# Patient Record
Sex: Female | Born: 1940 | Race: White | Hispanic: No | Marital: Married | Smoking: Former smoker
Health system: Southern US, Community
[De-identification: ages and names within clinical notes are randomized; demographics above are authoritative.]

## PROBLEM LIST (undated history)

## (undated) DIAGNOSIS — I10 Essential (primary) hypertension: Secondary | ICD-10-CM

## (undated) DIAGNOSIS — G4730 Sleep apnea, unspecified: Secondary | ICD-10-CM

## (undated) DIAGNOSIS — M1712 Unilateral primary osteoarthritis, left knee: Secondary | ICD-10-CM

## (undated) DIAGNOSIS — K219 Gastro-esophageal reflux disease without esophagitis: Secondary | ICD-10-CM

## (undated) DIAGNOSIS — C801 Malignant (primary) neoplasm, unspecified: Secondary | ICD-10-CM

## (undated) DIAGNOSIS — T8859XA Other complications of anesthesia, initial encounter: Secondary | ICD-10-CM

## (undated) DIAGNOSIS — Z8719 Personal history of other diseases of the digestive system: Secondary | ICD-10-CM

## (undated) DIAGNOSIS — I5189 Other ill-defined heart diseases: Secondary | ICD-10-CM

## (undated) DIAGNOSIS — J45909 Unspecified asthma, uncomplicated: Secondary | ICD-10-CM

## (undated) HISTORY — PX: COLONOSCOPY: SHX174

## (undated) HISTORY — DX: Essential (primary) hypertension: I10

## (undated) HISTORY — DX: Other ill-defined heart diseases: I51.89

## (undated) HISTORY — PX: ESOPHAGOGASTRODUODENOSCOPY: SHX1529

## (undated) HISTORY — PX: BREAST BIOPSY: SHX20

## (undated) HISTORY — PX: EYE SURGERY: SHX253

---

## 1997-08-19 ENCOUNTER — Other Ambulatory Visit: Admission: RE | Admit: 1997-08-19 | Discharge: 1997-08-19 | Payer: Self-pay | Admitting: Obstetrics and Gynecology

## 1997-10-10 ENCOUNTER — Ambulatory Visit (HOSPITAL_COMMUNITY): Admission: RE | Admit: 1997-10-10 | Discharge: 1997-10-10 | Payer: Self-pay | Admitting: Obstetrics and Gynecology

## 1998-08-21 ENCOUNTER — Other Ambulatory Visit: Admission: RE | Admit: 1998-08-21 | Discharge: 1998-08-21 | Payer: Self-pay | Admitting: Obstetrics and Gynecology

## 1998-10-23 ENCOUNTER — Encounter: Payer: Self-pay | Admitting: Obstetrics and Gynecology

## 1998-10-23 ENCOUNTER — Ambulatory Visit (HOSPITAL_COMMUNITY): Admission: RE | Admit: 1998-10-23 | Discharge: 1998-10-23 | Payer: Self-pay | Admitting: Obstetrics and Gynecology

## 1999-03-01 ENCOUNTER — Other Ambulatory Visit: Admission: RE | Admit: 1999-03-01 | Discharge: 1999-03-01 | Payer: Self-pay | Admitting: Internal Medicine

## 2000-03-20 ENCOUNTER — Other Ambulatory Visit: Admission: RE | Admit: 2000-03-20 | Discharge: 2000-03-20 | Payer: Self-pay | Admitting: Obstetrics and Gynecology

## 2000-07-02 ENCOUNTER — Encounter: Payer: Self-pay | Admitting: Obstetrics and Gynecology

## 2000-07-02 ENCOUNTER — Ambulatory Visit (HOSPITAL_COMMUNITY): Admission: RE | Admit: 2000-07-02 | Discharge: 2000-07-02 | Payer: Self-pay | Admitting: Obstetrics and Gynecology

## 2001-07-01 ENCOUNTER — Other Ambulatory Visit: Admission: RE | Admit: 2001-07-01 | Discharge: 2001-07-01 | Payer: Self-pay | Admitting: Obstetrics and Gynecology

## 2001-12-30 ENCOUNTER — Encounter: Admission: RE | Admit: 2001-12-30 | Discharge: 2001-12-30 | Payer: Self-pay | Admitting: Family Medicine

## 2001-12-30 ENCOUNTER — Encounter: Payer: Self-pay | Admitting: Family Medicine

## 2002-01-07 ENCOUNTER — Encounter: Admission: RE | Admit: 2002-01-07 | Discharge: 2002-01-07 | Payer: Self-pay | Admitting: Family Medicine

## 2002-01-07 ENCOUNTER — Encounter: Payer: Self-pay | Admitting: Family Medicine

## 2002-08-16 ENCOUNTER — Other Ambulatory Visit: Admission: RE | Admit: 2002-08-16 | Discharge: 2002-08-16 | Payer: Self-pay | Admitting: *Deleted

## 2003-12-14 ENCOUNTER — Other Ambulatory Visit: Admission: RE | Admit: 2003-12-14 | Discharge: 2003-12-14 | Payer: Self-pay | Admitting: Obstetrics and Gynecology

## 2005-02-14 ENCOUNTER — Other Ambulatory Visit: Admission: RE | Admit: 2005-02-14 | Discharge: 2005-02-14 | Payer: Self-pay | Admitting: Obstetrics and Gynecology

## 2009-02-24 HISTORY — PX: CHOLECYSTECTOMY: SHX55

## 2009-03-13 ENCOUNTER — Inpatient Hospital Stay (HOSPITAL_COMMUNITY): Admission: EM | Admit: 2009-03-13 | Discharge: 2009-03-14 | Payer: Self-pay | Admitting: Emergency Medicine

## 2009-03-13 ENCOUNTER — Encounter (INDEPENDENT_AMBULATORY_CARE_PROVIDER_SITE_OTHER): Payer: Self-pay | Admitting: Surgery

## 2010-08-30 LAB — COMPREHENSIVE METABOLIC PANEL
Albumin: 4.1 g/dL (ref 3.5–5.2)
BUN: 15 mg/dL (ref 6–23)
Chloride: 104 mEq/L (ref 96–112)
Creatinine, Ser: 0.8 mg/dL (ref 0.4–1.2)
Total Bilirubin: 0.9 mg/dL (ref 0.3–1.2)

## 2010-08-30 LAB — CBC
HCT: 35.3 % — ABNORMAL LOW (ref 36.0–46.0)
HCT: 42.8 % (ref 36.0–46.0)
Hemoglobin: 14.3 g/dL (ref 12.0–15.0)
MCHC: 33.5 g/dL (ref 30.0–36.0)
MCHC: 34.3 g/dL (ref 30.0–36.0)
MCV: 88 fL (ref 78.0–100.0)
MCV: 88.9 fL (ref 78.0–100.0)
Platelets: 260 10*3/uL (ref 150–400)
Platelets: 333 10*3/uL (ref 150–400)
RBC: 4.82 MIL/uL (ref 3.87–5.11)
RDW: 13.9 % (ref 11.5–15.5)
RDW: 14.4 % (ref 11.5–15.5)
WBC: 7.8 10*3/uL (ref 4.0–10.5)
WBC: 9.5 10*3/uL (ref 4.0–10.5)

## 2010-08-30 LAB — DIFFERENTIAL
Basophils Absolute: 0 10*3/uL (ref 0.0–0.1)
Lymphocytes Relative: 5 % — ABNORMAL LOW (ref 12–46)
Monocytes Absolute: 0.2 10*3/uL (ref 0.1–1.0)
Neutro Abs: 8.8 10*3/uL — ABNORMAL HIGH (ref 1.7–7.7)

## 2010-08-30 LAB — CREATININE, SERUM: GFR calc Af Amer: >60 mL/min (ref >60)

## 2011-09-03 DIAGNOSIS — L719 Rosacea, unspecified: Secondary | ICD-10-CM | POA: Diagnosis not present

## 2011-10-22 DIAGNOSIS — R0609 Other forms of dyspnea: Secondary | ICD-10-CM | POA: Diagnosis not present

## 2011-10-22 DIAGNOSIS — R0989 Other specified symptoms and signs involving the circulatory and respiratory systems: Secondary | ICD-10-CM | POA: Diagnosis not present

## 2011-10-23 DIAGNOSIS — R0602 Shortness of breath: Secondary | ICD-10-CM | POA: Diagnosis not present

## 2011-10-23 DIAGNOSIS — R072 Precordial pain: Secondary | ICD-10-CM | POA: Diagnosis not present

## 2011-10-28 DIAGNOSIS — E7800 Pure hypercholesterolemia, unspecified: Secondary | ICD-10-CM | POA: Diagnosis not present

## 2011-10-28 DIAGNOSIS — R0609 Other forms of dyspnea: Secondary | ICD-10-CM | POA: Diagnosis not present

## 2011-10-28 DIAGNOSIS — R9439 Abnormal result of other cardiovascular function study: Secondary | ICD-10-CM | POA: Diagnosis not present

## 2011-10-28 DIAGNOSIS — R079 Chest pain, unspecified: Secondary | ICD-10-CM | POA: Diagnosis not present

## 2011-11-01 DIAGNOSIS — R079 Chest pain, unspecified: Secondary | ICD-10-CM | POA: Diagnosis not present

## 2011-11-01 DIAGNOSIS — R0602 Shortness of breath: Secondary | ICD-10-CM | POA: Diagnosis not present

## 2011-11-04 DIAGNOSIS — R0602 Shortness of breath: Secondary | ICD-10-CM | POA: Diagnosis not present

## 2011-11-04 DIAGNOSIS — R079 Chest pain, unspecified: Secondary | ICD-10-CM | POA: Diagnosis not present

## 2011-11-11 DIAGNOSIS — R0609 Other forms of dyspnea: Secondary | ICD-10-CM | POA: Diagnosis not present

## 2011-11-11 DIAGNOSIS — E7800 Pure hypercholesterolemia, unspecified: Secondary | ICD-10-CM | POA: Diagnosis not present

## 2011-11-11 DIAGNOSIS — R9439 Abnormal result of other cardiovascular function study: Secondary | ICD-10-CM | POA: Diagnosis not present

## 2011-11-11 DIAGNOSIS — R079 Chest pain, unspecified: Secondary | ICD-10-CM | POA: Diagnosis not present

## 2011-11-18 ENCOUNTER — Institutional Professional Consult (permissible substitution): Payer: Self-pay | Admitting: Cardiovascular Disease

## 2012-04-09 DIAGNOSIS — H2510 Age-related nuclear cataract, unspecified eye: Secondary | ICD-10-CM | POA: Diagnosis not present

## 2012-04-09 DIAGNOSIS — H31009 Unspecified chorioretinal scars, unspecified eye: Secondary | ICD-10-CM | POA: Diagnosis not present

## 2012-04-09 DIAGNOSIS — H04129 Dry eye syndrome of unspecified lacrimal gland: Secondary | ICD-10-CM | POA: Diagnosis not present

## 2012-04-09 DIAGNOSIS — H43819 Vitreous degeneration, unspecified eye: Secondary | ICD-10-CM | POA: Diagnosis not present

## 2012-05-22 DIAGNOSIS — J111 Influenza due to unidentified influenza virus with other respiratory manifestations: Secondary | ICD-10-CM | POA: Diagnosis not present

## 2012-05-22 DIAGNOSIS — R509 Fever, unspecified: Secondary | ICD-10-CM | POA: Diagnosis not present

## 2012-08-18 ENCOUNTER — Other Ambulatory Visit: Payer: Self-pay

## 2012-08-18 DIAGNOSIS — Z1231 Encounter for screening mammogram for malignant neoplasm of breast: Secondary | ICD-10-CM

## 2012-09-17 ENCOUNTER — Ambulatory Visit: Payer: Self-pay

## 2012-09-22 ENCOUNTER — Ambulatory Visit
Admission: RE | Admit: 2012-09-22 | Discharge: 2012-09-22 | Disposition: A | Discharge status: Home / Self Care | Payer: Medicare Other | Source: Ambulatory Visit

## 2012-09-22 DIAGNOSIS — Z1231 Encounter for screening mammogram for malignant neoplasm of breast: Secondary | ICD-10-CM

## 2012-11-10 ENCOUNTER — Encounter: Payer: Self-pay | Admitting: Family Medicine

## 2012-11-10 ENCOUNTER — Ambulatory Visit (INDEPENDENT_AMBULATORY_CARE_PROVIDER_SITE_OTHER): Payer: Medicare Other | Admitting: Family Medicine

## 2012-11-10 VITALS — BP 148/80 | HR 78 | Temp 98.3°F | Resp 16 | Wt 177.0 lb

## 2012-11-10 DIAGNOSIS — F418 Other specified anxiety disorders: Secondary | ICD-10-CM

## 2012-11-10 DIAGNOSIS — I5189 Other ill-defined heart diseases: Secondary | ICD-10-CM | POA: Insufficient documentation

## 2012-11-10 DIAGNOSIS — F411 Generalized anxiety disorder: Secondary | ICD-10-CM

## 2012-11-10 DIAGNOSIS — I10 Essential (primary) hypertension: Secondary | ICD-10-CM | POA: Diagnosis not present

## 2012-11-10 MED ORDER — ALPRAZOLAM 0.5 MG PO TABS: 0.5000 mg | ORAL_TABLET | Freq: Three times a day (TID) | ORAL | Status: DC | PRN

## 2012-11-10 MED ORDER — METOPROLOL SUCCINATE ER 25 MG PO TB24: 25.0000 mg | ORAL_TABLET | Freq: Every day | ORAL | Status: DC

## 2012-11-10 NOTE — Progress Notes (Signed)
  Subjective:    Patient ID: Laura Carter, female    DOB: 01-Sep-1940, 72 y.o.   MRN: [id number]  HPI Patient's blood pressure has been slightly elevated at home. Systolics have been ranging 130 to 140s over diastolics of 80-100.  She has also had increasing anxiety and stress at home. Her husband has medical issues that have not yet been fully evaluated. Her family is flying to [place]. She is dealing with her sister's cancer. All these stressors seem to be having increasing her blood pressure. It has been borderline in the past however now it seems to be true hypertension. She denies any depression. She denies any chest pain or shortness of breath. She has previously tried lisinopril and  hydrochlorothiazide but had stop these medications due to side effects.  She also believes she may have a hernia under her xiphoid process. Past Medical History  Diagnosis Date  . Diastolic dysfunction    No current outpatient prescriptions on file prior to visit.   No current facility-administered medications on file prior to visit.   No Known Allergies History   Social History  . Marital Status: Married    Spouse Name: N/A    Number of Children: N/A  . Years of Education: N/A   Occupational History  . Not on file.   Social History Main Topics  . Smoking status: Former Smoker    Types: Cigarettes  . Smokeless tobacco: Not on file  . Alcohol Use: Not on file  . Drug Use: Not on file  . Sexually Active: Not on file   Other Topics Concern  . Not on file   Social History Narrative  . No narrative on file       Review of Systems  All other systems reviewed and are negative.       Objective:   Physical Exam  Vitals reviewed. Constitutional: She appears well-developed and well-nourished.  Cardiovascular: Normal rate, regular rhythm and normal heart sounds.  Exam reveals no gallop.   No murmur heard. Pulmonary/Chest: Effort normal and breath sounds normal. No respiratory distress.  She has no wheezes. She has no rales.  Abdominal: Soft. Bowel sounds are normal. She exhibits no distension and no mass. There is no tenderness. There is no rebound and no guarding.   I do not palpate a hernia I do feel a diastases recti.       Assessment & Plan:  1. HTN (hypertension) Add Toprol-XL 25 mg by mouth daily. Recheck blood pressure in 2 weeks 2. Situational anxiety I gave the patient prescription for Xanax 0.5 mg tablets one by mouth Q8 hours when necessary anxiety. I gave her 30 tablets. Is to use these sparingly.Marland Kitchen

## 2012-11-30 ENCOUNTER — Encounter: Payer: Self-pay | Admitting: Family Medicine

## 2012-11-30 ENCOUNTER — Ambulatory Visit (INDEPENDENT_AMBULATORY_CARE_PROVIDER_SITE_OTHER): Payer: Medicare Other | Admitting: Family Medicine

## 2012-11-30 VITALS — BP 142/78 | HR 82 | Temp 98.6°F | Resp 18 | Wt 179.0 lb

## 2012-11-30 DIAGNOSIS — I10 Essential (primary) hypertension: Secondary | ICD-10-CM | POA: Diagnosis not present

## 2012-11-30 DIAGNOSIS — J209 Acute bronchitis, unspecified: Secondary | ICD-10-CM | POA: Diagnosis not present

## 2012-11-30 NOTE — Progress Notes (Signed)
Subjective:    Patient ID: Laura Carter, female    DOB: 1940-09-03, 72 y.o.   MRN: [id number]  Hypertension  Cough  11/10/12 Patient's blood pressure has been slightly elevated at home. Systolics have been ranging 130 to 140s over diastolics of 80-100.  She has also had increasing anxiety and stress at home. Her husband has medical issues that have not yet been fully evaluated. Her family is flying to [place]. She is dealing with her sister's cancer. All these stressors seem to be having increasing her blood pressure. It has been borderline in the past however now it seems to be true hypertension. She denies any depression. She denies any chest pain or shortness of breath. She has previously tried lisinopril and  hydrochlorothiazide but had stop these medications due to side effects. At the time, I diagnosed her with: 1. HTN (hypertension) Add Toprol-XL 25 mg by mouth daily. Recheck blood pressure in 2 weeks 2. Situational anxiety I gave the patient prescription for Xanax 0.5 mg tablets one by mouth Q8 hours when necessary anxiety. I gave her 30 tablets. Is to use these sparingly.  11/30/12 Once the patient began to Toprol-XL 25 mg by mouth daily, blood pressure dropped precipitously to 100-116/60 and she felt fatigued.  Having now stop the medication for over a week her blood pressure is again elevated.  She also reports a one-week history of sore throat and nonproductive cough. She reports some chest congestion. She denies any fevers chills or shortness of breath. She does have a remote history of smoking Past Medical History  Diagnosis Date  . Diastolic dysfunction    Current Outpatient Prescriptions on File Prior to Visit  Medication Sig Dispense Refill  . aspirin 81 MG tablet Take 81 mg by mouth daily.      . cholecalciferol (VITAMIN D) 1000 UNITS tablet Take 1,000 Units by mouth daily.      Marland Kitchen co-enzyme Q-10 30 MG capsule Take 30 mg by mouth 3 (three) times daily.      Boris Lown Oil 500 MG  CAPS Take by mouth.      . Magnesium 500 MG CAPS Take by mouth.      . metoprolol succinate (TOPROL-XL) 25 MG 24 hr tablet Take 1 tablet (25 mg total) by mouth daily.  30 tablet  3   No current facility-administered medications on file prior to visit.   No Known Allergies History   Social History  . Marital Status: Married    Spouse Name: N/A    Number of Children: N/A  . Years of Education: N/A   Occupational History  . Not on file.   Social History Main Topics  . Smoking status: Former Smoker    Types: Cigarettes  . Smokeless tobacco: Not on file  . Alcohol Use: Not on file  . Drug Use: Not on file  . Sexually Active: Not on file   Other Topics Concern  . Not on file   Social History Narrative  . No narrative on file       Review of Systems  Respiratory: Positive for cough.   All other systems reviewed and are negative.       Objective:   Physical Exam  Vitals reviewed. Constitutional: She appears well-developed and well-nourished.  Cardiovascular: Normal rate, regular rhythm and normal heart sounds.  Exam reveals no gallop.   No murmur heard. Pulmonary/Chest: Effort normal and breath sounds normal. No respiratory distress. She has no wheezes. She has no rales.  Abdominal: Soft. Bowel sounds are normal. She exhibits no distension and no mass. There is no tenderness. There is no rebound and no guarding.          Assessment & Plan:  1. HTN (hypertension) Reduce Toprol-XL to 12.5 mg by mouth daily and recheck blood pressure in 2 weeks. The patient will call me with her blood pressure readings from home.. 2. Acute bronchitis I feel this is most likely viral in origin. I recommended tincture of time. I also recommended Mucinex DM over-the-counter for cough.  Patient states it seems to be improving over the last 2 days.  If worsening I would be glad to call out a Z-Pak.

## 2013-03-30 DIAGNOSIS — H04229 Epiphora due to insufficient drainage, unspecified lacrimal gland: Secondary | ICD-10-CM | POA: Diagnosis not present

## 2013-03-30 DIAGNOSIS — H02109 Unspecified ectropion of unspecified eye, unspecified eyelid: Secondary | ICD-10-CM | POA: Diagnosis not present

## 2013-04-12 DIAGNOSIS — H43819 Vitreous degeneration, unspecified eye: Secondary | ICD-10-CM | POA: Diagnosis not present

## 2013-04-12 DIAGNOSIS — H04129 Dry eye syndrome of unspecified lacrimal gland: Secondary | ICD-10-CM | POA: Diagnosis not present

## 2013-04-12 DIAGNOSIS — H259 Unspecified age-related cataract: Secondary | ICD-10-CM | POA: Diagnosis not present

## 2013-04-12 DIAGNOSIS — H31009 Unspecified chorioretinal scars, unspecified eye: Secondary | ICD-10-CM | POA: Diagnosis not present

## 2013-05-28 ENCOUNTER — Telehealth: Payer: Self-pay | Admitting: Family Medicine

## 2013-05-28 ENCOUNTER — Encounter: Payer: Self-pay | Admitting: Family Medicine

## 2013-05-28 MED ORDER — METOPROLOL SUCCINATE ER 25 MG PO TB24: 25.0000 mg | ORAL_TABLET | Freq: Every day | ORAL | Status: DC

## 2013-05-28 NOTE — Telephone Encounter (Signed)
Medication refill for one time only.  Patient needs to be seen.  Letter sent for patient to call and schedule 

## 2013-06-03 ENCOUNTER — Ambulatory Visit (INDEPENDENT_AMBULATORY_CARE_PROVIDER_SITE_OTHER): Payer: Medicare Other | Admitting: Family Medicine

## 2013-06-03 ENCOUNTER — Encounter: Payer: Self-pay | Admitting: Family Medicine

## 2013-06-03 VITALS — BP 124/72 | HR 80 | Temp 98.6°F | Resp 18 | Ht 65.0 in | Wt 176.0 lb

## 2013-06-03 DIAGNOSIS — I10 Essential (primary) hypertension: Secondary | ICD-10-CM

## 2013-06-03 DIAGNOSIS — J069 Acute upper respiratory infection, unspecified: Secondary | ICD-10-CM | POA: Diagnosis not present

## 2013-06-03 MED ORDER — METOPROLOL SUCCINATE ER 25 MG PO TB24: 25.0000 mg | ORAL_TABLET | Freq: Every day | ORAL | Status: DC

## 2013-06-03 NOTE — Progress Notes (Signed)
Subjective:    Patient ID: Laura Carter, female    DOB: 1941-05-04, 73 y.o.   MRN: [id number]  HPI Patient reports a one-week history of nasal congestion, rhinorrhea, nonproductive cough, otalgia, and head congestion. She denies any fevers. She denies any myalgias. She denies any nausea vomiting or diarrhea. She is due for recheck of her blood pressure as well. She's tried taking Toprol-XL 25 mg by mouth daily. Her blood pressure is well controlled today. She denies any chest pain or shortness of breath. She is overdue for fasting lab work. Past Medical History  Diagnosis Date  . Diastolic dysfunction    Current Outpatient Prescriptions on File Prior to Visit  Medication Sig Dispense Refill  . aspirin 81 MG tablet Take 81 mg by mouth daily.      . cholecalciferol (VITAMIN D) 1000 UNITS tablet Take 1,000 Units by mouth daily.      Marland Kitchen co-enzyme Q-10 30 MG capsule Take 30 mg by mouth 3 (three) times daily.      Javier Docker Oil 500 MG CAPS Take by mouth.      . Magnesium 500 MG CAPS Take by mouth.       No current facility-administered medications on file prior to visit.   No Known Allergies History   Social History  . Marital Status: Married    Spouse Name: N/A    Number of Children: N/A  . Years of Education: N/A   Occupational History  . Not on file.   Social History Main Topics  . Smoking status: Former Smoker    Types: Cigarettes  . Smokeless tobacco: Not on file  . Alcohol Use: Not on file  . Drug Use: Not on file  . Sexual Activity: Not on file   Other Topics Concern  . Not on file   Social History Narrative  . No narrative on file      Review of Systems  All other systems reviewed and are negative.       Objective:   Physical Exam  Vitals reviewed. Constitutional: She appears well-developed and well-nourished. No distress.  HENT:  Head: Normocephalic and atraumatic.  Right Ear: Tympanic membrane, external ear and ear canal normal.  Left Ear: Tympanic  membrane, external ear and ear canal normal.  Nose: Mucosal edema and rhinorrhea present. Right sinus exhibits no maxillary sinus tenderness and no frontal sinus tenderness. Left sinus exhibits no maxillary sinus tenderness and no frontal sinus tenderness.  Mouth/Throat: Oropharynx is clear and moist and mucous membranes are normal. No oropharyngeal exudate.  Eyes: Conjunctivae are normal. Pupils are equal, round, and reactive to light.  Neck: Neck supple. No JVD present.  Cardiovascular: Normal rate, regular rhythm, normal heart sounds and intact distal pulses.  Exam reveals no gallop and no friction rub.   No murmur heard. Pulmonary/Chest: Effort normal and breath sounds normal. No respiratory distress. She has no wheezes. She has no rales. She exhibits no tenderness.  Abdominal: Soft. Bowel sounds are normal. She exhibits no distension. There is no tenderness. There is no rebound and no guarding.  Lymphadenopathy:    She has no cervical adenopathy.  Skin: She is not diaphoretic.          Assessment & Plan:  1. Acute URI Patient has a viral upper respiratory infection. I recommended Mucinex DM as needed for cough. I recommended Coricidin HBP as needed for congestion. I recommended tincture of time. Recheck in 1 week if no better or sooner if worse  2. HTN (hypertension) Blood pressures well controlled today. Continue current medications at their present doses. Return fasting for a CBC, CMP, and fasting lipid panel. I also recommended that the patient receive Prevnar 13 when she feels better. - COMPLETE METABOLIC PANEL WITH GFR; Future - CBC with Differential; Future - Lipid panel; Future

## 2013-06-04 ENCOUNTER — Ambulatory Visit: Payer: Medicare Other | Admitting: Family Medicine

## 2013-07-05 ENCOUNTER — Other Ambulatory Visit: Payer: Medicare Other

## 2013-07-05 DIAGNOSIS — I10 Essential (primary) hypertension: Secondary | ICD-10-CM | POA: Diagnosis not present

## 2013-07-05 LAB — LIPID PANEL
CHOL/HDL RATIO: 3.8 ratio
Cholesterol: 162 mg/dL (ref 0–200)
HDL: 43 mg/dL (ref >39)
LDL CALC: 84 mg/dL (ref 0–99)
TRIGLYCERIDES: 174 mg/dL — AB (ref <150)
VLDL: 35 mg/dL (ref 0–40)

## 2013-07-05 LAB — CBC WITH DIFFERENTIAL/PLATELET
BASOS PCT: 1 % (ref 0–1)
Basophils Absolute: 0 10*3/uL (ref 0.0–0.1)
EOS ABS: 0.2 10*3/uL (ref 0.0–0.7)
Eosinophils Relative: 4 % (ref 0–5)
HEMATOCRIT: 38.6 % (ref 36.0–46.0)
HEMOGLOBIN: 13 g/dL (ref 12.0–15.0)
LYMPHS ABS: 1.4 10*3/uL (ref 0.7–4.0)
Lymphocytes Relative: 35 % (ref 12–46)
MCH: 28.9 pg (ref 26.0–34.0)
MCHC: 33.7 g/dL (ref 30.0–36.0)
MCV: 85.8 fL (ref 78.0–100.0)
MONO ABS: 0.3 10*3/uL (ref 0.1–1.0)
MONOS PCT: 8 % (ref 3–12)
NEUTROS ABS: 2.1 10*3/uL (ref 1.7–7.7)
Neutrophils Relative %: 52 % (ref 43–77)
Platelets: 297 10*3/uL (ref 150–400)
RBC: 4.5 MIL/uL (ref 3.87–5.11)
RDW: 14.9 % (ref 11.5–15.5)
WBC: 4 10*3/uL (ref 4.0–10.5)

## 2013-07-05 LAB — COMPLETE METABOLIC PANEL WITH GFR
ALK PHOS: 70 U/L (ref 39–117)
ALT: 19 U/L (ref 0–35)
AST: 17 U/L (ref 0–37)
Albumin: 3.6 g/dL (ref 3.5–5.2)
BILIRUBIN TOTAL: 0.7 mg/dL (ref 0.2–1.2)
BUN: 15 mg/dL (ref 6–23)
CO2: 28 mEq/L (ref 19–32)
Calcium: 9.1 mg/dL (ref 8.4–10.5)
Chloride: 107 mEq/L (ref 96–112)
Creat: 0.77 mg/dL (ref 0.50–1.10)
GFR, Est African American: 89 mL/min
GFR, Est Non African American: 77 mL/min
Glucose, Bld: 102 mg/dL — ABNORMAL HIGH (ref 70–99)
Potassium: 4.6 mEq/L (ref 3.5–5.3)
SODIUM: 142 meq/L (ref 135–145)
TOTAL PROTEIN: 6.2 g/dL (ref 6.0–8.3)

## 2013-07-07 ENCOUNTER — Encounter: Payer: Self-pay | Admitting: Family Medicine

## 2013-07-07 ENCOUNTER — Ambulatory Visit (INDEPENDENT_AMBULATORY_CARE_PROVIDER_SITE_OTHER): Payer: Medicare Other | Admitting: Family Medicine

## 2013-07-07 VITALS — BP 120/60 | HR 78 | Temp 97.5°F | Resp 18 | Ht 65.0 in | Wt 180.0 lb

## 2013-07-07 DIAGNOSIS — I10 Essential (primary) hypertension: Secondary | ICD-10-CM | POA: Diagnosis not present

## 2013-07-07 MED ORDER — METOPROLOL SUCCINATE ER 25 MG PO TB24: 25.0000 mg | ORAL_TABLET | Freq: Every day | ORAL | Status: DC

## 2013-07-07 NOTE — Progress Notes (Signed)
Subjective:    Patient ID: Laura Carter, female    DOB: 02/03/1941, 73 y.o.   MRN: [id number]  HPI Patient today for recheck of her blood pressure. She's currently on Toprol-XL 25 mg by mouth daily. She denies any chest pain shortness of breath or dyspnea on exertion. Her home blood pressure has been ranging 120 134/6070. It is well controlled. She denies any side effects or complications from medication. Her most recent lab work is listed below: Appointment on 07/05/2013  Component Date Value Ref Range Status  . Sodium 07/05/2013 142  135 - 145 mEq/L Final  . Potassium 07/05/2013 4.6  3.5 - 5.3 mEq/L Final  . Chloride 07/05/2013 107  96 - 112 mEq/L Final  . CO2 07/05/2013 28  19 - 32 mEq/L Final  . Glucose, Bld 07/05/2013 102* 70 - 99 mg/dL Final  . BUN 07/05/2013 15  6 - 23 mg/dL Final  . Creat 07/05/2013 0.77  0.50 - 1.10 mg/dL Final  . Total Bilirubin 07/05/2013 0.7  0.2 - 1.2 mg/dL Final   ** Please note change in reference range(s). **  . Alkaline Phosphatase 07/05/2013 70  39 - 117 U/L Final  . AST 07/05/2013 17  0 - 37 U/L Final  . ALT 07/05/2013 19  0 - 35 U/L Final  . Total Protein 07/05/2013 6.2  6.0 - 8.3 g/dL Final  . Albumin 07/05/2013 3.6  3.5 - 5.2 g/dL Final  . Calcium 07/05/2013 9.1  8.4 - 10.5 mg/dL Final  . GFR, Est African American 07/05/2013 89   Final  . GFR, Est Non African American 07/05/2013 77   Final   Comment:                            The estimated GFR is a calculation valid for adults (>=39 years old)                          that uses the CKD-EPI algorithm to adjust for age and sex. It is                            not to be used for children, pregnant women, hospitalized patients,                             patients on dialysis, or with rapidly changing kidney function.                          According to the NKDEP, eGFR >89 is normal, 60-89 shows mild                          impairment, 30-59 shows moderate impairment, 15-29 shows severe                     impairment and <15 is ESRD.                             . WBC 07/05/2013 4.0  4.0 - 10.5 K/uL Final  . RBC 07/05/2013 4.50  3.87 - 5.11 MIL/uL Final  . Hemoglobin 07/05/2013 13.0  12.0 - 15.0 g/dL Final  . HCT 07/05/2013 38.6  36.0 - 46.0 %  Final  . MCV 07/05/2013 85.8  78.0 - 100.0 fL Final  . MCH 07/05/2013 28.9  26.0 - 34.0 pg Final  . MCHC 07/05/2013 33.7  30.0 - 36.0 g/dL Final  . RDW 07/05/2013 14.9  11.5 - 15.5 % Final  . Platelets 07/05/2013 297  150 - 400 K/uL Final  . Neutrophils Relative % 07/05/2013 52  43 - 77 % Final  . Neutro Abs 07/05/2013 2.1  1.7 - 7.7 K/uL Final  . Lymphocytes Relative 07/05/2013 35  12 - 46 % Final  . Lymphs Abs 07/05/2013 1.4  0.7 - 4.0 K/uL Final  . Monocytes Relative 07/05/2013 8  3 - 12 % Final  . Monocytes Absolute 07/05/2013 0.3  0.1 - 1.0 K/uL Final  . Eosinophils Relative 07/05/2013 4  0 - 5 % Final  . Eosinophils Absolute 07/05/2013 0.2  0.0 - 0.7 K/uL Final  . Basophils Relative 07/05/2013 1  0 - 1 % Final  . Basophils Absolute 07/05/2013 0.0  0.0 - 0.1 K/uL Final  . Smear Review 07/05/2013 Criteria for review not met   Final  . Cholesterol 07/05/2013 162  0 - 200 mg/dL Final   Comment: ATP III Classification:                                < 200        mg/dL        Desirable                               200 - 239     mg/dL        Borderline High                               >= 240        mg/dL        High                             . Triglycerides 07/05/2013 174* <150 mg/dL Final  . HDL 07/05/2013 43  >39 mg/dL Final  . Total CHOL/HDL Ratio 07/05/2013 3.8   Final  . VLDL 07/05/2013 35  0 - 40 mg/dL Final  . LDL Cholesterol 07/05/2013 84  0 - 99 mg/dL Final   Comment:                            Total Cholesterol/HDL Ratio:CHD Risk                                                 Coronary Heart Disease Risk Table                                                                 Men       Women  1/2 Average Risk              3.4        3.3                                       Average Risk              5.0        4.4                                    2X Average Risk              9.6        7.1                                    3X Average Risk             23.4       11.0                          Use the calculated Patient Ratio above and the CHD Risk table                           to determine the patient's CHD Risk.                          ATP III Classification (LDL):                                < 100        mg/dL         Optimal                               100 - 129     mg/dL         Near or Above Optimal                               130 - 159     mg/dL         Borderline High                               160 - 189     mg/dL         High                                > 190        mg/dL         Very High                              Past Medical History  Diagnosis Date  . Diastolic dysfunction   . Hypertension    Current Outpatient Prescriptions on File Prior  to Visit  Medication Sig Dispense Refill  . aspirin 81 MG tablet Take 81 mg by mouth daily.      . cholecalciferol (VITAMIN D) 1000 UNITS tablet Take 1,000 Units by mouth daily.      Marland Kitchen co-enzyme Q-10 30 MG capsule Take 30 mg by mouth 3 (three) times daily.      Javier Docker Oil 500 MG CAPS Take by mouth.      . Magnesium 500 MG CAPS Take by mouth.       No current facility-administered medications on file prior to visit.   No Known Allergies History   Social History  . Marital Status: Married    Spouse Name: N/A    Number of Children: N/A  . Years of Education: N/A   Occupational History  . Not on file.   Social History Main Topics  . Smoking status: Former Smoker    Types: Cigarettes  . Smokeless tobacco: Not on file  . Alcohol Use: Not on file  . Drug Use: Not on file  . Sexual Activity: Not on file   Other Topics Concern  . Not on file   Social History Narrative  . No narrative  on file      Review of Systems  All other systems reviewed and are negative.       Objective:   Physical Exam  Vitals reviewed. Constitutional: She appears well-developed and well-nourished.  Neck: Neck supple. No JVD present. No thyromegaly present.  Cardiovascular: Normal rate, regular rhythm and normal heart sounds.   No murmur heard. Pulmonary/Chest: Effort normal and breath sounds normal. No respiratory distress. She has no wheezes. She has no rales.  Abdominal: Soft. Bowel sounds are normal. She exhibits no distension. There is no tenderness. There is no rebound and no guarding.  Lymphadenopathy:    She has no cervical adenopathy.          Assessment & Plan:  1. HTN (hypertension) Patient's lab work is excellent. Her blood pressures well controlled. Continue Toprol-XL 25 mg by mouth daily. Recheck in 6 months or as needed.

## 2013-07-28 ENCOUNTER — Telehealth: Payer: Self-pay | Admitting: Family Medicine

## 2013-07-28 NOTE — Telephone Encounter (Signed)
Pt did not want to wait for an appt with Dr. Dennard Schaumann so earliest appointment made with Dr. Buelah Manis

## 2013-07-28 NOTE — Telephone Encounter (Signed)
She needs and appointment to have this done.  [place]

## 2013-07-28 NOTE — Telephone Encounter (Signed)
Message copied by Alyson Locket on Wed Jul 28, 2013 12:36 PM ------      Message from: Lenore Manner      Created: Wed Jul 28, 2013  8:18 AM      Regarding: Spot on nose      Contact: [phone]       PT is wanting to know if she can come in today and have the spot burned off of her nose, she doesn't think she needs an apt but wants to know if Dr Buelah Manis can do it since Dr Dennard Schaumann isn't here.  ------

## 2013-07-30 ENCOUNTER — Encounter: Payer: Self-pay | Admitting: Family Medicine

## 2013-07-30 ENCOUNTER — Ambulatory Visit: Payer: Medicare Other | Admitting: Family Medicine

## 2013-07-30 ENCOUNTER — Ambulatory Visit (INDEPENDENT_AMBULATORY_CARE_PROVIDER_SITE_OTHER): Payer: Medicare Other | Admitting: Family Medicine

## 2013-07-30 VITALS — BP 132/74 | HR 78 | Temp 97.6°F | Resp 18 | Ht 65.0 in | Wt 178.0 lb

## 2013-07-30 DIAGNOSIS — D485 Neoplasm of uncertain behavior of skin: Secondary | ICD-10-CM

## 2013-07-30 NOTE — Progress Notes (Signed)
   Subjective:    Patient ID: Laura Carter, female    DOB: 1941-03-21, 73 y.o.   MRN: [id number]  HPI Patient has a 5 mm scaly red papule on the tip of her nose for several months. 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   She is here today for treatment. Past Medical History  Diagnosis Date  . Diastolic dysfunction   . Hypertension    Current Outpatient Prescriptions on File Prior to Visit  Medication Sig Dispense Refill  . aspirin 81 MG tablet Take 81 mg by mouth daily.      . cholecalciferol (VITAMIN D) 1000 UNITS tablet Take 1,000 Units by mouth daily.      Marland Kitchen co-enzyme Q-10 30 MG capsule Take 30 mg by mouth 3 (three) times daily.      Javier Docker Oil 500 MG CAPS Take by mouth.      . Magnesium 500 MG CAPS Take by mouth.      . metoprolol succinate (TOPROL-XL) 25 MG 24 hr tablet Take 1 tablet (25 mg total) by mouth daily.  90 tablet  3   No current facility-administered medications on file prior to visit.   No Known Allergies History   Social History  . Marital Status: Married    Spouse Name: N/A    Number of Children: N/A  . Years of Education: N/A   Occupational History  . Not on file.   Social History Main Topics  . Smoking status: Former Smoker    Types: Cigarettes  . Smokeless tobacco: Not on file  . Alcohol Use: Not on file  . Drug Use: Not on file  . Sexual Activity: Not on file   Other Topics Concern  . Not on file   Social History Narrative  . No narrative on file      Review of Systems  All other systems reviewed and are negative.       Objective:   Physical Exam  Vitals reviewed. Cardiovascular: Normal rate and regular rhythm.   Pulmonary/Chest: Effort normal and breath sounds normal.  5 millimeters scaly red papule on the tip of the patient's nose        Assessment & Plan:  1. 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 Lesion with cryotherapy using liquid nitrogen for 30 seconds. Wound care was discussed. Recheck in one month. If the lesion persists I would perform a shave biopsy.

## 2013-11-03 ENCOUNTER — Other Ambulatory Visit: Payer: Self-pay

## 2013-11-03 DIAGNOSIS — Z1231 Encounter for screening mammogram for malignant neoplasm of breast: Secondary | ICD-10-CM

## 2013-11-15 ENCOUNTER — Ambulatory Visit
Admission: RE | Admit: 2013-11-15 | Discharge: 2013-11-15 | Disposition: A | Discharge status: Home / Self Care | Payer: Medicare Other | Source: Ambulatory Visit

## 2013-11-15 DIAGNOSIS — Z1231 Encounter for screening mammogram for malignant neoplasm of breast: Secondary | ICD-10-CM | POA: Diagnosis not present

## 2014-04-05 ENCOUNTER — Encounter: Payer: Self-pay | Admitting: Orthopedic Surgery

## 2014-04-05 ENCOUNTER — Ambulatory Visit (INDEPENDENT_AMBULATORY_CARE_PROVIDER_SITE_OTHER): Payer: Medicare Other | Admitting: Orthopedic Surgery

## 2014-04-05 ENCOUNTER — Ambulatory Visit (INDEPENDENT_AMBULATORY_CARE_PROVIDER_SITE_OTHER): Payer: Medicare Other

## 2014-04-05 VITALS — BP 150/75 | Ht 65.0 in | Wt 181.0 lb

## 2014-04-05 DIAGNOSIS — M1712 Unilateral primary osteoarthritis, left knee: Secondary | ICD-10-CM | POA: Diagnosis not present

## 2014-04-05 DIAGNOSIS — M25562 Pain in left knee: Secondary | ICD-10-CM | POA: Diagnosis not present

## 2014-04-05 NOTE — Patient Instructions (Signed)
Tylenol and exercises

## 2014-04-05 NOTE — Progress Notes (Signed)
Patient ID: Laura Carter, female   DOB: 1940-07-29, 73 y.o.   MRN: [id number] Patient ID: Laura Carter, female   DOB: 1941-05-18, 73 y.o.   MRN: [id number]  Chief Complaint  Patient presents with  . Knee Pain    Left knee pain    HPI Laura Carter is a 73 y.o. female.  HPI 73 year old female with mild hypertension presents with 2 out of 10 dull aching pain associated with swelling and catching in her left knee. She hasn't really taken any medication she reports exercise and massage therapy helps her knee pain she has no functional deficits  Review of systems ringing in the ears sinus problem vision disturbance breathing issues seasonal allergies otherwise all systems were negative  Medical history of asthma  Previous cholecystectomy  Medications as listed  No drug allergies; she said that her social history was normal   Past Medical History  Diagnosis Date  . Diastolic dysfunction   . Hypertension     Past Surgical History  Procedure Laterality Date  . Cholecystectomy  10/10    Family History  Problem Relation Age of Onset  . Cancer Mother     breast  . Heart disease Father   . Heart disease Sister     Social History History  Substance Use Topics  . Smoking status: Former Smoker    Types: Cigarettes  . Smokeless tobacco: Not on file  . Alcohol Use: Not on file    No Known Allergies  Current Outpatient Prescriptions  Medication Sig Dispense Refill  . aspirin 81 MG tablet Take 81 mg by mouth daily.    . cholecalciferol (VITAMIN D) 1000 UNITS tablet Take 1,000 Units by mouth daily.    Marland Kitchen co-enzyme Q-10 30 MG capsule Take 30 mg by mouth 3 (three) times daily.    Javier Docker Oil 500 MG CAPS Take by mouth.    . Magnesium 500 MG CAPS Take by mouth.    . metoprolol succinate (TOPROL-XL) 25 MG 24 hr tablet Take 1 tablet (25 mg total) by mouth daily. 90 tablet 3   No current facility-administered medications for this visit.    Review of Systems Review of  Systems  Blood pressure 150/75, height 5\' 5"  (1.651 m), weight 181 lb (82.101 kg).  Physical Exam Physical Exam BP 150/75 mmHg  Ht 5\' 5"  (1.651 m)  Wt 181 lb (82.101 kg)  BMI 30.12 kg/m2 Gen. Appearance is normal, normal grooming normal hygiene no gross deformities. She is oriented 3  Mood and affect are normal  Gait and station are normal no lymph node assistive device  Left knee no effusion but she does have medial joint line tenderness Her range of motion passively is complete 130. All ligaments are tested and stable. Muscle function grade 5 strength no atrophy. Skin normal good distal pulse and normal sensation and reflexes   Data Reviewed Independent x-ray review severe arthritis medial compartment with varus alignment  Assessment    Encounter Diagnosis  Name Primary?  . Primary osteoarthritis of left knee Yes        Plan    Recommend weight loss, exercise, over-the-counter Tylenol and call me and reevaluate when her symptoms worsen        Arther Abbott 04/05/2014, 11:16 AM

## 2014-04-28 ENCOUNTER — Telehealth: Payer: Self-pay | Admitting: Orthopedic Surgery

## 2014-04-28 NOTE — Telephone Encounter (Signed)
Patient called, states has been doing knee exercises and continuing massage therapy and yoga, but would like to try physical therapy.  She knows a therapist, Sherron Flemings, at [facility].  Their therapy department is under group name of "[facility]", [address], [place], [place], ph# [phone], fax# [phone].  Asking if Dr [name] can send orders? Patient ph# is [phone]

## 2014-04-28 NOTE — Telephone Encounter (Signed)
ok 

## 2014-04-28 NOTE — Telephone Encounter (Signed)
Routing to dr harrison 

## 2014-05-02 ENCOUNTER — Other Ambulatory Visit: Payer: Self-pay | Admitting: *Deleted

## 2014-05-02 DIAGNOSIS — M1712 Unilateral primary osteoarthritis, left knee: Secondary | ICD-10-CM

## 2014-05-02 NOTE — Telephone Encounter (Signed)
Order sent as requested, called patient, left vm

## 2014-05-09 DIAGNOSIS — M25562 Pain in left knee: Secondary | ICD-10-CM | POA: Diagnosis not present

## 2014-05-09 DIAGNOSIS — M1712 Unilateral primary osteoarthritis, left knee: Secondary | ICD-10-CM | POA: Diagnosis not present

## 2014-05-13 DIAGNOSIS — M25562 Pain in left knee: Secondary | ICD-10-CM | POA: Diagnosis not present

## 2014-05-13 DIAGNOSIS — M1712 Unilateral primary osteoarthritis, left knee: Secondary | ICD-10-CM | POA: Diagnosis not present

## 2014-05-17 DIAGNOSIS — M25562 Pain in left knee: Secondary | ICD-10-CM | POA: Diagnosis not present

## 2014-05-17 DIAGNOSIS — M1712 Unilateral primary osteoarthritis, left knee: Secondary | ICD-10-CM | POA: Diagnosis not present

## 2014-05-23 ENCOUNTER — Ambulatory Visit (INDEPENDENT_AMBULATORY_CARE_PROVIDER_SITE_OTHER): Payer: Medicare Other | Admitting: Family Medicine

## 2014-05-23 ENCOUNTER — Encounter: Payer: Self-pay | Admitting: Family Medicine

## 2014-05-23 VITALS — BP 120/60 | HR 64 | Temp 98.6°F | Resp 16 | Ht 63.0 in | Wt 179.0 lb

## 2014-05-23 DIAGNOSIS — J209 Acute bronchitis, unspecified: Secondary | ICD-10-CM

## 2014-05-23 MED ORDER — ALBUTEROL SULFATE HFA 108 (90 BASE) MCG/ACT IN AERS: 2.0000 | INHALATION_SPRAY | RESPIRATORY_TRACT | Status: DC | PRN

## 2014-05-23 MED ORDER — AZITHROMYCIN 250 MG PO TABS: ORAL_TABLET | ORAL | Status: DC

## 2014-05-23 NOTE — Progress Notes (Signed)
Patient ID: Laura Carter, female   DOB: [birth year], 73 y.o.   MRN: [id number]   Subjective:    Patient ID: Laura Carter, female    DOB: 1941-04-10, 73 y.o.   MRN: [id number]  Patient presents for Illness patient here with worsening cough with production of yellow sputum and wheezing for little over a week. She has no history of any lung disease. She is a nonsmoker. Her medications were reviewed. She has been using Mucinex as well as Robitussin-DM as well as some herbals , zinc, lemon honey and a diffuser. She denies any shortness of breath or chest pain. Denies any fever. She declines flu shot but is thinking about getting her pneumonia vaccine. + sick contacts    Review Of Systems:  GEN- denies fatigue, fever, weight loss,weakness, recent illness HEENT- denies eye drainage, change in vision, nasal discharge, CVS- denies chest pain, palpitations RESP- denies SOB, +cough, +wheeze ABD- denies N/V, change in stools, abd pain MSK- denies joint pain, muscle aches, injury Neuro- denies headache, dizziness, syncope, seizure activity       Objective:    BP 120/60 mmHg  Pulse 64  Temp(Src) 98.6 F (37 C) (Oral)  Resp 16  Ht 5\' 3"  (1.6 m)  Wt 179 lb (81.194 kg)  BMI 31.72 kg/m2  SpO2 97% GEN- NAD, alert and oriented x3 HEENT- PERRL, EOMI, non injected sclera, pink conjunctiva, MMM, oropharynx mild injection, TM clear bilat no effusion, no maxillary sinus tenderness,+ post nasal drip Nasal drainage  Neck- Supple, no LAD CVS- RRR, no murmur RESP-course BS, wheeze, bilat in lower lobes, no rales, normal WOB, no retractions EXT- No edema Pulses- Radial 2+          Assessment & Plan:      Problem List Items Addressed This Visit    None    Visit Diagnoses    Acute bronchitis, unspecified organism    -  Primary    Treat for bronchitis, albuterol, robitusin DM at night, Mucinex during day, will add zpak based on age and exam to probable cover bacterial component       Note: This  dictation was prepared with Dragon dictation along with smaller phrase technology. Any transcriptional errors that result from this process are unintentional.

## 2014-05-23 NOTE — Patient Instructions (Signed)
Run humidifer RObitussin at bedtime Okay to use netty pot Albuterol as needed for wheeze  Azithromycin antibiotic  F/U as needed

## 2014-05-26 ENCOUNTER — Telehealth: Payer: Self-pay | Admitting: *Deleted

## 2014-05-26 DIAGNOSIS — M25562 Pain in left knee: Secondary | ICD-10-CM | POA: Diagnosis not present

## 2014-05-26 DIAGNOSIS — M1712 Unilateral primary osteoarthritis, left knee: Secondary | ICD-10-CM | POA: Diagnosis not present

## 2014-05-26 MED ORDER — LEVOFLOXACIN 500 MG PO TABS: 500.0000 mg | ORAL_TABLET | Freq: Every day | ORAL | Status: DC

## 2014-05-26 NOTE — Telephone Encounter (Signed)
levaquin 500 qday for 7 days

## 2014-05-26 NOTE — Telephone Encounter (Signed)
Prescription sent to pharmacy.   Call placed to patient. LMTRC.  

## 2014-05-26 NOTE — Telephone Encounter (Signed)
Received call from patient.   Reports that she was seen by MD on 05/23/2014 for bronchitis. States that she was prescribed Zpack and cough suppressants (OTC Robitussin during day and OTC Mucinex at night).   States that she has been taking the ABTx and cough meds, but she feels that she is getting worse. Reports that she now has extreme congestion and productive cough with cream colored to yellow mucus. Denies fever.   Requested to have ABTx extended or changed to stronger medication, and requested stronger cough medication.   MD please advise.

## 2014-05-30 NOTE — Telephone Encounter (Signed)
Call placed to patient.   States that she has begun ABTx. States that cough continues and she is now experiencing rib pain from cough.   Advised to completed ABTx and to increase fluids during the day.

## 2014-05-31 DIAGNOSIS — M25562 Pain in left knee: Secondary | ICD-10-CM | POA: Diagnosis not present

## 2014-05-31 DIAGNOSIS — M1712 Unilateral primary osteoarthritis, left knee: Secondary | ICD-10-CM | POA: Diagnosis not present

## 2014-06-02 DIAGNOSIS — M25562 Pain in left knee: Secondary | ICD-10-CM | POA: Diagnosis not present

## 2014-06-02 DIAGNOSIS — M1712 Unilateral primary osteoarthritis, left knee: Secondary | ICD-10-CM | POA: Diagnosis not present

## 2014-06-03 DIAGNOSIS — H31001 Unspecified chorioretinal scars, right eye: Secondary | ICD-10-CM | POA: Diagnosis not present

## 2014-06-03 DIAGNOSIS — H2513 Age-related nuclear cataract, bilateral: Secondary | ICD-10-CM | POA: Diagnosis not present

## 2014-06-03 DIAGNOSIS — H43813 Vitreous degeneration, bilateral: Secondary | ICD-10-CM | POA: Diagnosis not present

## 2014-06-03 DIAGNOSIS — H5213 Myopia, bilateral: Secondary | ICD-10-CM | POA: Diagnosis not present

## 2014-06-06 ENCOUNTER — Ambulatory Visit (INDEPENDENT_AMBULATORY_CARE_PROVIDER_SITE_OTHER): Payer: Medicare Other | Admitting: Family Medicine

## 2014-06-06 ENCOUNTER — Encounter: Payer: Self-pay | Admitting: Family Medicine

## 2014-06-06 VITALS — BP 136/72 | HR 68 | Temp 97.7°F | Resp 16 | Ht 64.0 in | Wt 176.0 lb

## 2014-06-06 DIAGNOSIS — J209 Acute bronchitis, unspecified: Secondary | ICD-10-CM | POA: Diagnosis not present

## 2014-06-06 MED ORDER — PREDNISONE 20 MG PO TABS: 40.0000 mg | ORAL_TABLET | Freq: Every day | ORAL | Status: DC

## 2014-06-06 NOTE — Progress Notes (Signed)
Patient ID: Laura Carter, female   DOB: 1941/01/19, 74 y.o.   MRN: [id number]       Subjective:    Patient ID: Laura Carter, female    DOB: May 09, 1941, 74 y.o.   MRN: [id number]  Patient presents for Cough patient here with persistent cough with production. I treated her for bronchitis on 1228 she was given a Z-Pak and she is using Robitussin-DM and Mucinex in the morning. She called and last week with worsening cough with production and change in the sputum therefore she was switched to Levaquin which she completed. She continues to cough and has tightness in her chest as well as some wheezing she never used the albuterol inhaler. She's not had any fever. She has noticed some hoarse voice now    Review Of Systems:  GEN- denies fatigue, fever, weight loss,weakness, recent illness HEENT- denies eye drainage, change in vision, nasal discharge, CVS- denies chest pain, palpitations RESP- denies SOB, +cough, +wheeze ABD- denies N/V, change in stools, abd pain GU- denies dysuria, hematuria, dribbling, incontinence MSK- denies joint pain, muscle aches, injury Neuro- denies headache, dizziness, syncope, seizure activity       Objective:    BP 136/72 mmHg  Pulse 68  Temp(Src) 97.7 F (36.5 C) (Oral)  Resp 16  Ht 5\' 4"  (1.626 m)  Wt 176 lb (79.833 kg)  BMI 30.20 kg/m2  SpO2 94% GEN- NAD, alert and oriented x3 HEENT- PERRL, EOMI, non injected sclera, pink conjunctiva, MMM, oropharynx mild injection, TM clear bilat no effusion, no maxillary sinus tenderness,+ post nasal drip  Neck- Supple, no LAD CVS- RRR, no murmur RESP-course BS, bilat scattered wheeze good air movement, normal WOB, no retractions EXT- No edema Pulses- Radial 2+        Assessment & Plan:      Problem List Items Addressed This Visit    None    Visit Diagnoses    Acute bronchitis, unspecified organism    -  Primary    CXR to be obtained, will give Depo Medrol in office and prednisone burst, advised to use  albuterol, continue robitussin DM, no further antibitics unless +CXR    Relevant Orders       DG Chest 2 View       Note: This dictation was prepared with Dragon dictation along with smaller phrase technology. Any transcriptional errors that result from this process are unintentional.

## 2014-06-06 NOTE — Patient Instructions (Signed)
Chest xray tomorrow Steroid shot given Start the steroid pills tomorrow, use inhaler every 4 hours for cough or wheeze Mucinex during day, robitussin DM at bedtime F/U pending results

## 2014-06-07 ENCOUNTER — Ambulatory Visit
Admission: RE | Admit: 2014-06-07 | Discharge: 2014-06-07 | Disposition: A | Discharge status: Home / Self Care | Payer: Medicare Other | Source: Ambulatory Visit | Attending: Family Medicine | Admitting: Family Medicine

## 2014-06-07 ENCOUNTER — Other Ambulatory Visit: Payer: Self-pay | Admitting: *Deleted

## 2014-06-07 DIAGNOSIS — M25562 Pain in left knee: Secondary | ICD-10-CM | POA: Diagnosis not present

## 2014-06-07 DIAGNOSIS — M1712 Unilateral primary osteoarthritis, left knee: Secondary | ICD-10-CM | POA: Diagnosis not present

## 2014-06-07 DIAGNOSIS — J209 Acute bronchitis, unspecified: Secondary | ICD-10-CM

## 2014-06-07 DIAGNOSIS — J189 Pneumonia, unspecified organism: Secondary | ICD-10-CM | POA: Diagnosis not present

## 2014-06-07 MED ORDER — LEVOFLOXACIN 500 MG PO TABS: 500.0000 mg | ORAL_TABLET | Freq: Every day | ORAL | Status: DC

## 2014-06-08 ENCOUNTER — Other Ambulatory Visit: Payer: Self-pay | Admitting: Family Medicine

## 2014-06-08 ENCOUNTER — Telehealth: Payer: Self-pay | Admitting: *Deleted

## 2014-06-08 NOTE — Telephone Encounter (Signed)
This has been taken care of.

## 2014-06-08 NOTE — Telephone Encounter (Signed)
[place] and spoke to [facility] to inform there was a mistake on the order and to please refill the levaquin 500mg  x 5days also pt is aware.

## 2014-06-08 NOTE — Telephone Encounter (Signed)
Pt called stating that she went to pick up her antibiotic Levaquin from her pharmacy today around lunch time and they stated that Dr. Antony Contras office Denied it stating that pt needs office visit. Pt was here 01/11 with acute bronchitis and under the impression that this medication was called in by Dr. Buelah Manis.. Please clarify! Is patient suppose to be on this medication. I see in her med list she was given zpak and prednisone and given levaquin on 05/26/14 not nothing recently.  [place]

## 2014-06-08 NOTE — Telephone Encounter (Signed)
Antibiotic refill denied.  Pt called left message [facility] for refill.

## 2014-06-09 DIAGNOSIS — M25562 Pain in left knee: Secondary | ICD-10-CM | POA: Diagnosis not present

## 2014-06-09 DIAGNOSIS — M1712 Unilateral primary osteoarthritis, left knee: Secondary | ICD-10-CM | POA: Diagnosis not present

## 2014-06-13 DIAGNOSIS — M1712 Unilateral primary osteoarthritis, left knee: Secondary | ICD-10-CM | POA: Diagnosis not present

## 2014-06-13 DIAGNOSIS — M25562 Pain in left knee: Secondary | ICD-10-CM | POA: Diagnosis not present

## 2014-06-15 DIAGNOSIS — L814 Other melanin hyperpigmentation: Secondary | ICD-10-CM | POA: Diagnosis not present

## 2014-06-15 DIAGNOSIS — L309 Dermatitis, unspecified: Secondary | ICD-10-CM | POA: Diagnosis not present

## 2014-06-15 DIAGNOSIS — L821 Other seborrheic keratosis: Secondary | ICD-10-CM | POA: Diagnosis not present

## 2014-06-15 DIAGNOSIS — D225 Melanocytic nevi of trunk: Secondary | ICD-10-CM | POA: Diagnosis not present

## 2014-06-15 DIAGNOSIS — D1801 Hemangioma of skin and subcutaneous tissue: Secondary | ICD-10-CM | POA: Diagnosis not present

## 2014-06-15 DIAGNOSIS — L570 Actinic keratosis: Secondary | ICD-10-CM | POA: Diagnosis not present

## 2014-06-15 DIAGNOSIS — L853 Xerosis cutis: Secondary | ICD-10-CM | POA: Diagnosis not present

## 2014-06-16 DIAGNOSIS — M1712 Unilateral primary osteoarthritis, left knee: Secondary | ICD-10-CM | POA: Diagnosis not present

## 2014-06-16 DIAGNOSIS — M25562 Pain in left knee: Secondary | ICD-10-CM | POA: Diagnosis not present

## 2014-06-21 DIAGNOSIS — M25562 Pain in left knee: Secondary | ICD-10-CM | POA: Diagnosis not present

## 2014-06-21 DIAGNOSIS — M1712 Unilateral primary osteoarthritis, left knee: Secondary | ICD-10-CM | POA: Diagnosis not present

## 2014-06-23 DIAGNOSIS — M1712 Unilateral primary osteoarthritis, left knee: Secondary | ICD-10-CM | POA: Diagnosis not present

## 2014-06-23 DIAGNOSIS — M25562 Pain in left knee: Secondary | ICD-10-CM | POA: Diagnosis not present

## 2014-06-28 DIAGNOSIS — M25562 Pain in left knee: Secondary | ICD-10-CM | POA: Diagnosis not present

## 2014-06-28 DIAGNOSIS — M1712 Unilateral primary osteoarthritis, left knee: Secondary | ICD-10-CM | POA: Diagnosis not present

## 2014-06-30 ENCOUNTER — Ambulatory Visit (INDEPENDENT_AMBULATORY_CARE_PROVIDER_SITE_OTHER): Payer: Medicare Other | Admitting: Physician Assistant

## 2014-06-30 ENCOUNTER — Encounter: Payer: Self-pay | Admitting: Physician Assistant

## 2014-06-30 VITALS — BP 106/66 | HR 60 | Temp 98.2°F | Resp 18 | Wt 175.0 lb

## 2014-06-30 DIAGNOSIS — M25562 Pain in left knee: Secondary | ICD-10-CM | POA: Diagnosis not present

## 2014-06-30 DIAGNOSIS — J189 Pneumonia, unspecified organism: Secondary | ICD-10-CM

## 2014-06-30 DIAGNOSIS — M1712 Unilateral primary osteoarthritis, left knee: Secondary | ICD-10-CM | POA: Diagnosis not present

## 2014-06-30 MED ORDER — LEVOFLOXACIN 750 MG PO TABS: 750.0000 mg | ORAL_TABLET | Freq: Every day | ORAL | Status: DC

## 2014-06-30 NOTE — Progress Notes (Signed)
Patient ID: Laura Carter MRN: [id number], DOB: 02-23-1941, 74 y.o. Date of Encounter: 06/30/2014, 3:03 PM    Chief Complaint:  Chief Complaint  Patient presents with  . follow up    bronchitis>pneumonia  feels like it may be returuning     HPI: 74 y.o. year old white female here for the above.  I reviewed her last 2 office visit notes with Dr. Buelah Manis.  She saw Dr. Buelah Manis 05/23/14. At that time presented with worsening cough with production of yellow sputum and wheezing for a little over a week. Exam at that time was notable for coarse breath sounds, wheeze, bilateral and lower lobes. She was diagnosed with bronchitis and treated with albuterol and azithromycin Z-Pak.  She subsequently called in and reported worsening cough with production of sputum and was changed to Levaquin.  She had follow-up visit with Dr. Buelah Manis 06/06/14. At that time, she reported that she had completed the Levaquin. At that visit she reported that she was continuing to cough and had tightness in her chest as well as some wheezing but had never used the albuterol inhaler. Reported that she had not had any fever. Exam at that time was notable for coarse breath sounds, bilateral scattered wheeze. Was treated with Depo-Medrol IM in the office followed by oral prednisone. Chest x-ray was ordered and Dr. Buelah Manis planned to wait to review that prior to any further treatment.  Chest x-ray was performed on 06/07/2014 and did show pneumonia. Dr. Buelah Manis then prescribed Levaquin 500 mg for 5 more days.  TODAY: Patient states that she came in for follow-up just to make sure that she was not starting to develop bronchitis again. States that during the day she feels fine. Even during the night she feels fine and has no cough or congestion while sleeping. However, says that first thing in the morning she may sneeze and blow her nose once but then that is all cleared. Says that first thing in the morning she has a cough and  that the phlegm has been light green early in the morning. Says that once she coughs this out and blows her nose once , then things are clear and stay cleared all day. She is not feeling that she has any wheezing or rattling in her chest like she was at prior visits. Still, has had no fever.     Home Meds:   Outpatient Prescriptions Prior to Visit  Medication Sig Dispense Refill  . albuterol (PROVENTIL HFA;VENTOLIN HFA) 108 (90 BASE) MCG/ACT inhaler Inhale 2 puffs into the lungs every 4 (four) hours as needed for wheezing or shortness of breath. 1 Inhaler 1  . aspirin 81 MG tablet Take 81 mg by mouth daily.    . cholecalciferol (VITAMIN D) 1000 UNITS tablet Take 1,000 Units by mouth daily.    Marland Kitchen co-enzyme Q-10 30 MG capsule Take 30 mg by mouth 3 (three) times daily.    Javier Docker Oil 500 MG CAPS Take by mouth.    . Magnesium 500 MG CAPS Take by mouth.    . metoprolol succinate (TOPROL-XL) 25 MG 24 hr tablet Take 1 tablet (25 mg total) by mouth daily. 90 tablet 3  . levofloxacin (LEVAQUIN) 500 MG tablet Take 1 tablet (500 mg total) by mouth daily. (Patient not taking: Reported on 06/30/2014) 5 tablet 0  . predniSONE (DELTASONE) 20 MG tablet Take 2 tablets (40 mg total) by mouth daily with breakfast. (Patient not taking: Reported on 06/30/2014) 10 tablet 0  No facility-administered medications prior to visit.    Allergies: No Known Allergies    Review of Systems: See HPI for pertinent ROS. All other ROS negative.    Physical Exam: Blood pressure 106/66, pulse 60, temperature 98.2 F (36.8 C), temperature source Oral, resp. rate 18, weight 175 lb (79.379 kg)., Body mass index is 30.02 kg/(m^2). General: WNWD WF.  Appears in no acute distress. HEENT: Normocephalic, atraumatic, eyes without discharge, sclera non-icteric, nares are without discharge. Bilateral auditory canals clear, TM's are without perforation, pearly grey and translucent with reflective cone of light bilaterally. Oral cavity  moist, posterior pharynx without exudate, erythema, peritonsillar abscess.  Neck: Supple. No thyromegaly. No lymphadenopathy. Lungs: Clear bilaterally to auscultation without wheezes, rales, or rhonchi. Breathing is unlabored. Lungs are clear with good breath sounds and good air movement throughout with no wheezes or rhonchi. Heart: Regular rhythm. No murmurs, rubs, or gallops. Msk:  Strength and tone normal for age. Extremities/Skin: Warm and dry.  Neuro: Alert and oriented X 3. Moves all extremities spontaneously. Gait is normal. CNII-XII grossly in tact. Psych:  Responds to questions appropriately with a normal affect.     ASSESSMENT AND PLAN:  74 y.o. year old female with  1. Pneumonia, organism unspecified I discussed exam findings with patient and the fact that her lungs do sound clear on exam. However, discussed the fact that any type of thick dark phlegm indicates infection. Therefore, I recommend that she get another chest x-ray and take another round of Levaquin. She then says that she has busy weekend with big plans for Super Bowl.Plans to wait until Monday to get her chest x-ray. She also mentions that she is not planning to start the Levaquin now. I explained that if she wants to monitor things for another day or 2 that is fine but that definitely if the phlegm increases or darkens at all then she definitely needs to start the antibiotic. Discussed that just because her lungs sound clear on exam does not mean that there is not any infection there. He voices understanding and agrees with the above regarding needing antibiotics and following up with chest x-ray. - DG Chest 2 View; Future - levofloxacin (LEVAQUIN) 750 MG tablet; Take 1 tablet (750 mg total) by mouth daily.  Dispense: 10 tablet; Refill: 0   Signed, [address] [place], [place], [facility] 06/30/2014 3:03 PM

## 2014-07-04 ENCOUNTER — Ambulatory Visit
Admission: RE | Admit: 2014-07-04 | Discharge: 2014-07-04 | Disposition: A | Discharge status: Home / Self Care | Payer: Medicare Other | Source: Ambulatory Visit | Attending: Physician Assistant | Admitting: Physician Assistant

## 2014-07-04 DIAGNOSIS — J984 Other disorders of lung: Secondary | ICD-10-CM | POA: Diagnosis not present

## 2014-07-04 DIAGNOSIS — J189 Pneumonia, unspecified organism: Secondary | ICD-10-CM

## 2014-07-05 DIAGNOSIS — M1712 Unilateral primary osteoarthritis, left knee: Secondary | ICD-10-CM | POA: Diagnosis not present

## 2014-07-05 DIAGNOSIS — M25562 Pain in left knee: Secondary | ICD-10-CM | POA: Diagnosis not present

## 2014-07-07 DIAGNOSIS — M25562 Pain in left knee: Secondary | ICD-10-CM | POA: Diagnosis not present

## 2014-07-07 DIAGNOSIS — M1712 Unilateral primary osteoarthritis, left knee: Secondary | ICD-10-CM | POA: Diagnosis not present

## 2014-09-12 ENCOUNTER — Encounter: Payer: Self-pay | Admitting: Family Medicine

## 2014-09-12 ENCOUNTER — Ambulatory Visit (INDEPENDENT_AMBULATORY_CARE_PROVIDER_SITE_OTHER): Payer: Medicare Other | Admitting: Family Medicine

## 2014-09-12 VITALS — BP 108/64 | HR 68 | Temp 97.6°F | Resp 16 | Ht 65.0 in | Wt 173.0 lb

## 2014-09-12 DIAGNOSIS — M25562 Pain in left knee: Secondary | ICD-10-CM

## 2014-09-12 DIAGNOSIS — J302 Other seasonal allergic rhinitis: Secondary | ICD-10-CM | POA: Diagnosis not present

## 2014-09-12 MED ORDER — FLUTICASONE PROPIONATE 50 MCG/ACT NA SUSP: 2.0000 | Freq: Every day | NASAL | Status: DC

## 2014-09-12 NOTE — Progress Notes (Signed)
Subjective:    Patient ID: Laura Carter, female    DOB: [birth year], 74 y.o.   MRN: [id number]  HPI Patient presents with a one-week history of cough, postnasal drip, sneezing, and dry itchy watery eyes. She has a history of seasonal allergies. She is concerned that she may have pneumonia or bronchitis. She denies any fever. She denies any shortness of breath. She denies any wheezing. She does have a cough productive of thick mucus. On examination today her right nasal passages essentially obstructed due to swollen nasal mucosa and rhinorrhea. Her left nasal passages markedly diminished in diameter due to swollen nasal mucosa. Otherwise remainder of her exam is normal. She is also having left anterior knee pain. She is seen Dr. [name] and an x-ray showed mild arthritis. She is contemplating getting an MRI. She states that the pain is intermittent and at the present time not severe enough to warrant an arthroscopic if MRI confirmed cartilage damage. She is wanted a second opinion. Past Medical History  Diagnosis Date  . Diastolic dysfunction   . Hypertension    Past Surgical History  Procedure Laterality Date  . Cholecystectomy  10/10   Current Outpatient Prescriptions on File Prior to Visit  Medication Sig Dispense Refill  . aspirin 81 MG tablet Take 81 mg by mouth daily.    Marland Kitchen b complex vitamins tablet Take 1 tablet by mouth daily.    . cholecalciferol (VITAMIN D) 1000 UNITS tablet Take 1,000 Units by mouth daily.    Marland Kitchen co-enzyme Q-10 30 MG capsule Take 30 mg by mouth 3 (three) times daily.    Javier Docker Oil 500 MG CAPS Take by mouth.    . Magnesium 500 MG CAPS Take by mouth.    . metoprolol succinate (TOPROL-XL) 25 MG 24 hr tablet Take 1 tablet (25 mg total) by mouth daily. 90 tablet 3  . albuterol (PROVENTIL HFA;VENTOLIN HFA) 108 (90 BASE) MCG/ACT inhaler Inhale 2 puffs into the lungs every 4 (four) hours as needed for wheezing or shortness of breath. (Patient not taking: Reported on  09/12/2014) 1 Inhaler 1  . Ascorbic Acid (VITAMIN C) 1000 MG tablet Take 2,000 mg by mouth daily.     No current facility-administered medications on file prior to visit.   No Known Allergies History   Social History  . Marital Status: Married    Spouse Name: N/A  . Number of Children: N/A  . Years of Education: N/A   Occupational History  . Not on file.   Social History Main Topics  . Smoking status: Former Smoker    Types: Cigarettes  . Smokeless tobacco: Not on file  . Alcohol Use: Not on file  . Drug Use: Not on file  . Sexual Activity: Not on file   Other Topics Concern  . Not on file   Social History Narrative      Review of Systems  All other systems reviewed and are negative.      Objective:   Physical Exam  Constitutional: She appears well-developed and well-nourished.  HENT:  Right Ear: External ear normal.  Left Ear: External ear normal.  Nose: Mucosal edema and rhinorrhea present. Right sinus exhibits no maxillary sinus tenderness and no frontal sinus tenderness. Left sinus exhibits no maxillary sinus tenderness and no frontal sinus tenderness.  Mouth/Throat: Oropharynx is clear and moist. No oropharyngeal exudate.  Neck: Neck supple.  Cardiovascular: Normal rate, regular rhythm and normal heart sounds.   No murmur heard. Pulmonary/Chest: Effort  normal and breath sounds normal. No respiratory distress. She has no wheezes. She has no rales.  Abdominal: Soft. Bowel sounds are normal. She exhibits no distension. There is no tenderness. There is no rebound and no guarding.  Lymphadenopathy:    She has no cervical adenopathy.  Vitals reviewed.         Assessment & Plan:  Seasonal allergies - Plan: fluticasone (FLONASE) 50 MCG/ACT nasal spray  Left anterior knee pain  I believe the patient's cough is due to seasonal allergies. I recommended Flonase 2 sprays each nostril twice a day. She can also try over-the-counter Zyrtec. I believe her left knee  pain is likely due to a combination of arthritis and possibly patellofemoral pain. At the present time I recommended against an MRI unless the pain becomes significant to the point that it is affecting her quality of life and that she would consider surgery. At the present time she is not ready for an arthroscopic surgery and therefore I said defer obtaining the MRI.

## 2014-09-13 ENCOUNTER — Other Ambulatory Visit: Payer: Self-pay | Admitting: Family Medicine

## 2014-09-13 NOTE — Telephone Encounter (Signed)
Refill appropriate and filled per protocol. 

## 2014-11-02 ENCOUNTER — Other Ambulatory Visit: Payer: Medicare Other

## 2014-11-02 DIAGNOSIS — Z79899 Other long term (current) drug therapy: Secondary | ICD-10-CM

## 2014-11-02 DIAGNOSIS — Z0000 Encounter for general adult medical examination without abnormal findings: Secondary | ICD-10-CM | POA: Diagnosis not present

## 2014-11-02 LAB — COMPLETE METABOLIC PANEL WITH GFR
ALT: 16 U/L (ref 0–35)
AST: 14 U/L (ref 0–37)
Albumin: 3.7 g/dL (ref 3.5–5.2)
Alkaline Phosphatase: 74 U/L (ref 39–117)
BUN: 17 mg/dL (ref 6–23)
CO2: 26 mEq/L (ref 19–32)
Calcium: 9 mg/dL (ref 8.4–10.5)
Chloride: 105 mEq/L (ref 96–112)
Creat: 0.83 mg/dL (ref 0.50–1.10)
GFR, Est African American: 80 mL/min
GFR, Est Non African American: 69 mL/min
Glucose, Bld: 95 mg/dL (ref 70–99)
Potassium: 4.7 mEq/L (ref 3.5–5.3)
Sodium: 139 mEq/L (ref 135–145)
Total Bilirubin: 0.7 mg/dL (ref 0.2–1.2)
Total Protein: 6.1 g/dL (ref 6.0–8.3)

## 2014-11-02 LAB — CBC WITH DIFFERENTIAL/PLATELET
Basophils Absolute: 0 10*3/uL (ref 0.0–0.1)
Basophils Relative: 1 % (ref 0–1)
Eosinophils Absolute: 0.2 10*3/uL (ref 0.0–0.7)
Eosinophils Relative: 5 % (ref 0–5)
HCT: 41.6 % (ref 36.0–46.0)
Hemoglobin: 13.7 g/dL (ref 12.0–15.0)
Lymphocytes Relative: 32 % (ref 12–46)
Lymphs Abs: 1.3 10*3/uL (ref 0.7–4.0)
MCH: 29.9 pg (ref 26.0–34.0)
MCHC: 32.9 g/dL (ref 30.0–36.0)
MCV: 90.8 fL (ref 78.0–100.0)
MPV: 8.8 fL (ref 8.6–12.4)
Monocytes Absolute: 0.4 10*3/uL (ref 0.1–1.0)
Monocytes Relative: 10 % (ref 3–12)
Neutro Abs: 2.1 10*3/uL (ref 1.7–7.7)
Neutrophils Relative %: 52 % (ref 43–77)
Platelets: 280 10*3/uL (ref 150–400)
RBC: 4.58 MIL/uL (ref 3.87–5.11)
RDW: 14.2 % (ref 11.5–15.5)
WBC: 4 10*3/uL (ref 4.0–10.5)

## 2014-11-02 LAB — LIPID PANEL
CHOL/HDL RATIO: 4.4 ratio
CHOLESTEROL: 172 mg/dL (ref 0–200)
HDL: 39 mg/dL — ABNORMAL LOW (ref >=46)
LDL CALC: 89 mg/dL (ref 0–99)
TRIGLYCERIDES: 222 mg/dL — AB (ref <150)
VLDL: 44 mg/dL — AB (ref 0–40)

## 2014-11-03 ENCOUNTER — Encounter: Payer: Self-pay | Admitting: *Deleted

## 2014-11-04 ENCOUNTER — Encounter: Payer: Self-pay | Admitting: *Deleted

## 2014-11-04 NOTE — Telephone Encounter (Signed)
This encounter was created in error - please disregard.

## 2014-12-06 ENCOUNTER — Ambulatory Visit (INDEPENDENT_AMBULATORY_CARE_PROVIDER_SITE_OTHER): Payer: Medicare Other | Admitting: Orthopedic Surgery

## 2014-12-06 ENCOUNTER — Encounter: Payer: Self-pay | Admitting: Orthopedic Surgery

## 2014-12-06 VITALS — BP 156/81 | Ht 65.0 in | Wt 173.0 lb

## 2014-12-06 DIAGNOSIS — M23322 Other meniscus derangements, posterior horn of medial meniscus, left knee: Secondary | ICD-10-CM | POA: Diagnosis not present

## 2014-12-06 DIAGNOSIS — M1712 Unilateral primary osteoarthritis, left knee: Secondary | ICD-10-CM | POA: Diagnosis not present

## 2014-12-06 NOTE — Patient Instructions (Signed)
We will obtain pre-certification from the insurer and call you to schedule the study. Dr Harrison will call you with the results    

## 2014-12-06 NOTE — Progress Notes (Signed)
Patient ID: Laura Carter, female   DOB: 09/25/1940, 74 y.o.   MRN: [id number]  Follow up visit  Chief Complaint  Patient presents with  . Follow-up    Recheck on left knee.    BP 156/81 mmHg  Ht 5\' 5"  (1.651 m)  Wt 173 lb (78.472 kg)  BMI 28.79 kg/m2  Encounter Diagnoses  Name Primary?  . Primary osteoarthritis of left knee   . Medial meniscus, posterior horn derangement, left Yes    The patient comes in for follow-up 74 year old female with a catching sensation left knee and she feels a band rubbing the medial femoral condyle she has some posterior lateral knee pain which may be radicular in nature. Her previous treatments include 8 weeks of physical therapy and Tylenol. She did not want to take any anti-inflammatory is because of gastrointestinal complications associated with it  Currently she has no findings on review of systems related to this illness  Her repeat examination shows stable vital signs normal gait tenderness in the medial joint line palpable medial plica normal range of motion except for she cannot extend compared to her right knee which has full extension. Left knee is otherwise stable she has some pain over the medial compartment with McMurray's maneuver her quadriceps function is intact and normal in the skin in the left knee area is normal as well  We will do an MRI of her knee and then if it's arthritis will inject if it's cartilage tear we will recommend arthroscopic surgery

## 2014-12-20 ENCOUNTER — Ambulatory Visit (HOSPITAL_COMMUNITY)
Admission: RE | Admit: 2014-12-20 | Discharge: 2014-12-20 | Disposition: A | Discharge status: Home / Self Care | Payer: Medicare Other | Source: Ambulatory Visit | Attending: Orthopedic Surgery | Admitting: Orthopedic Surgery

## 2014-12-20 DIAGNOSIS — M23322 Other meniscus derangements, posterior horn of medial meniscus, left knee: Secondary | ICD-10-CM

## 2014-12-20 DIAGNOSIS — M948X6 Other specified disorders of cartilage, lower leg: Secondary | ICD-10-CM | POA: Insufficient documentation

## 2014-12-20 DIAGNOSIS — M25562 Pain in left knee: Secondary | ICD-10-CM | POA: Diagnosis not present

## 2014-12-20 DIAGNOSIS — M7122 Synovial cyst of popliteal space [Baker], left knee: Secondary | ICD-10-CM | POA: Diagnosis not present

## 2014-12-20 DIAGNOSIS — M25462 Effusion, left knee: Secondary | ICD-10-CM | POA: Diagnosis not present

## 2014-12-20 DIAGNOSIS — M23222 Derangement of posterior horn of medial meniscus due to old tear or injury, left knee: Secondary | ICD-10-CM | POA: Insufficient documentation

## 2014-12-28 ENCOUNTER — Telehealth: Payer: Self-pay | Admitting: Orthopedic Surgery

## 2014-12-28 NOTE — Telephone Encounter (Signed)
Called again.......

## 2015-01-12 ENCOUNTER — Encounter: Payer: Self-pay | Admitting: Orthopedic Surgery

## 2015-01-12 ENCOUNTER — Ambulatory Visit (INDEPENDENT_AMBULATORY_CARE_PROVIDER_SITE_OTHER): Payer: Medicare Other | Admitting: Orthopedic Surgery

## 2015-01-12 VITALS — BP 139/73 | Ht 65.0 in | Wt 176.0 lb

## 2015-01-12 DIAGNOSIS — M1712 Unilateral primary osteoarthritis, left knee: Secondary | ICD-10-CM

## 2015-01-12 DIAGNOSIS — M23322 Other meniscus derangements, posterior horn of medial meniscus, left knee: Secondary | ICD-10-CM | POA: Diagnosis not present

## 2015-01-12 NOTE — Progress Notes (Signed)
Chief Complaint  Patient presents with  . Follow-up    follow up left knee, MRI results    BP 139/73 mmHg  Ht 5\' 5"  (1.651 m)  Wt 176 lb (79.833 kg)  BMI 29.29 kg/m2  Patient came in to discuss her MRI have looked at the MRI gray she has arthritis is a torn meniscus but her symptoms aren't that significant and she says she can live with them the way they are  After approximately 10 minute discussion we decided to proceed with nonoperative therapy and have her call us if her symptoms get worse  MRI report also reviewed as follows  IMPRESSION: 1. Complete radial tear of the root of the posterior horn of the medial meniscus with a incomplete radial tear and longitudinal tear of the midbody. 2. Full-thickness cartilage loss in the medial compartment. 3. Large joint effusion with small Baker's cyst.     Electronically Signed   By: Lorriane Shire M.D.   On: 12/20/2014 16:30 Encounter Diagnoses  Name Primary?  . Medial meniscus, posterior horn derangement, left Yes  . Primary osteoarthritis of left knee

## 2015-01-31 ENCOUNTER — Other Ambulatory Visit: Payer: Self-pay | Admitting: Family Medicine

## 2015-02-22 ENCOUNTER — Other Ambulatory Visit: Payer: Self-pay

## 2015-02-22 DIAGNOSIS — Z1231 Encounter for screening mammogram for malignant neoplasm of breast: Secondary | ICD-10-CM

## 2015-03-09 ENCOUNTER — Ambulatory Visit
Admission: RE | Admit: 2015-03-09 | Discharge: 2015-03-09 | Disposition: A | Discharge status: Home / Self Care | Payer: Medicare Other | Source: Ambulatory Visit

## 2015-03-09 DIAGNOSIS — Z1231 Encounter for screening mammogram for malignant neoplasm of breast: Secondary | ICD-10-CM

## 2015-05-09 ENCOUNTER — Ambulatory Visit (INDEPENDENT_AMBULATORY_CARE_PROVIDER_SITE_OTHER): Payer: Medicare Other | Admitting: Family Medicine

## 2015-05-09 ENCOUNTER — Encounter: Payer: Self-pay | Admitting: Family Medicine

## 2015-05-09 VITALS — BP 128/78 | HR 68 | Temp 98.7°F | Resp 18 | Ht 65.0 in | Wt 178.0 lb

## 2015-05-09 DIAGNOSIS — J208 Acute bronchitis due to other specified organisms: Secondary | ICD-10-CM

## 2015-05-09 MED ORDER — AZITHROMYCIN 250 MG PO TABS: ORAL_TABLET | ORAL | Status: DC

## 2015-05-09 NOTE — Progress Notes (Signed)
Subjective:    Patient ID: Laura Carter, female    DOB: 09-16-1940, 74 y.o.   MRN: [id number]  HPI Symptoms began 4 days ago. They started with head congestion, rhinorrhea, and a sore scratchy throat. Yesterday she developed a cough productive of clear and green sputum. She denies any shortness of breath or chest pain or wheezing or fever. She denies any hemoptysis. Past Medical History  Diagnosis Date  . Diastolic dysfunction   . Hypertension    Past Surgical History  Procedure Laterality Date  . Cholecystectomy  10/10   Current Outpatient Prescriptions on File Prior to Visit  Medication Sig Dispense Refill  . albuterol (PROVENTIL HFA;VENTOLIN HFA) 108 (90 BASE) MCG/ACT inhaler Inhale 2 puffs into the lungs every 4 (four) hours as needed for wheezing or shortness of breath. 1 Inhaler 1  . Ascorbic Acid (VITAMIN C) 1000 MG tablet Take 2,000 mg by mouth daily.    Marland Kitchen aspirin 81 MG tablet Take 81 mg by mouth daily.    Marland Kitchen b complex vitamins tablet Take 1 tablet by mouth daily.    . cholecalciferol (VITAMIN D) 1000 UNITS tablet Take 1,000 Units by mouth daily.    Marland Kitchen co-enzyme Q-10 30 MG capsule Take 30 mg by mouth 3 (three) times daily.    . fluticasone (FLONASE) 50 MCG/ACT nasal spray Place 2 sprays into both nostrils daily. 16 g 6  . Krill Oil 500 MG CAPS Take by mouth.    . Magnesium 500 MG CAPS Take by mouth.    . metoprolol succinate (TOPROL-XL) 25 MG 24 hr tablet TAKE ONE TABLET BY MOUTH ONCE DAILY. 90 tablet 3   No current facility-administered medications on file prior to visit.   No Known Allergies Social History   Social History  . Marital Status: Married    Spouse Name: N/A  . Number of Children: N/A  . Years of Education: N/A   Occupational History  . Not on file.   Social History Main Topics  . Smoking status: Former Smoker    Types: Cigarettes  . Smokeless tobacco: Not on file  . Alcohol Use: Not on file  . Drug Use: Not on file  . Sexual Activity: Not on file     Other Topics Concern  . Not on file   Social History Narrative      Review of Systems  All other systems reviewed and are negative.      Objective:   Physical Exam  Constitutional: She appears well-developed and well-nourished.  HENT:  Right Ear: External ear normal.  Left Ear: External ear normal.  Nose: Mucosal edema and rhinorrhea present.  Mouth/Throat: Oropharynx is clear and moist. No oropharyngeal exudate.  Eyes: Conjunctivae are normal.  Cardiovascular: Normal rate, regular rhythm and normal heart sounds.   Pulmonary/Chest: Effort normal and breath sounds normal. No respiratory distress. She has no wheezes. She has no rales.  Lymphadenopathy:    She has no cervical adenopathy.  Vitals reviewed.         Assessment & Plan:  Acute bronchitis due to other specified organisms - Plan: azithromycin (ZITHROMAX) 250 MG tablet  I believe the patient has a viral upper respiratory infection or possibly viral bronchitis. I recommended tincture of time. She can use Sudafed for nasal congestion. She can use Mucinex DM for cough. Last year she had a severe attack that led  Into  Asthmatic bronchitis and this has her concerned. I did give the patient a Z-Pak but I  asked her not to fill the prescription unless she develops a fever, purulent sputum, or worsening cough. At the present time I recommended tincture of time

## 2015-05-31 ENCOUNTER — Ambulatory Visit (INDEPENDENT_AMBULATORY_CARE_PROVIDER_SITE_OTHER): Payer: Medicare Other | Admitting: Family Medicine

## 2015-05-31 ENCOUNTER — Encounter: Payer: Self-pay | Admitting: Family Medicine

## 2015-05-31 VITALS — BP 132/74 | HR 76 | Temp 98.1°F | Resp 16 | Ht 65.0 in | Wt 178.0 lb

## 2015-05-31 DIAGNOSIS — J209 Acute bronchitis, unspecified: Secondary | ICD-10-CM

## 2015-05-31 DIAGNOSIS — R5383 Other fatigue: Secondary | ICD-10-CM | POA: Diagnosis not present

## 2015-05-31 LAB — BASIC METABOLIC PANEL
BUN: 21 mg/dL (ref 7–25)
CHLORIDE: 103 mmol/L (ref 98–110)
CO2: 25 mmol/L (ref 20–31)
CREATININE: 0.87 mg/dL (ref 0.60–0.93)
Calcium: 9.6 mg/dL (ref 8.6–10.4)
GLUCOSE: 97 mg/dL (ref 70–99)
Potassium: 4.6 mmol/L (ref 3.5–5.3)
Sodium: 139 mmol/L (ref 135–146)

## 2015-05-31 LAB — CBC WITH DIFFERENTIAL/PLATELET
BASOS ABS: 0.1 10*3/uL (ref 0.0–0.1)
Basophils Relative: 1 % (ref 0–1)
Eosinophils Absolute: 0.3 10*3/uL (ref 0.0–0.7)
Eosinophils Relative: 4 % (ref 0–5)
HCT: 41.3 % (ref 36.0–46.0)
HEMOGLOBIN: 13.7 g/dL (ref 12.0–15.0)
LYMPHS PCT: 23 % (ref 12–46)
Lymphs Abs: 1.5 10*3/uL (ref 0.7–4.0)
MCH: 29.1 pg (ref 26.0–34.0)
MCHC: 33.2 g/dL (ref 30.0–36.0)
MCV: 87.7 fL (ref 78.0–100.0)
MPV: 8.9 fL (ref 8.6–12.4)
Monocytes Absolute: 0.4 10*3/uL (ref 0.1–1.0)
Monocytes Relative: 7 % (ref 3–12)
NEUTROS ABS: 4.2 10*3/uL (ref 1.7–7.7)
Neutrophils Relative %: 65 % (ref 43–77)
PLATELETS: 349 10*3/uL (ref 150–400)
RBC: 4.71 MIL/uL (ref 3.87–5.11)
RDW: 14.1 % (ref 11.5–15.5)
WBC: 6.4 10*3/uL (ref 4.0–10.5)

## 2015-05-31 LAB — TSH: TSH: 2.315 u[IU]/mL (ref 0.350–4.500)

## 2015-05-31 MED ORDER — PREDNISONE 20 MG PO TABS: 40.0000 mg | ORAL_TABLET | Freq: Every day | ORAL | Status: DC

## 2015-05-31 MED ORDER — ALBUTEROL SULFATE HFA 108 (90 BASE) MCG/ACT IN AERS: 2.0000 | INHALATION_SPRAY | RESPIRATORY_TRACT | Status: DC | PRN

## 2015-05-31 NOTE — Patient Instructions (Signed)
Take the prednisone as prescribed Use albuterol as needed Plenty of water with mucinex or change to Robitussin DM  We will call with lab results F/U as needed

## 2015-05-31 NOTE — Progress Notes (Signed)
Patient ID: [name], female   DOB: June 03, 1940, 75 y.o.   MRN: [id number]   Subjective:    Patient ID: Laura Carter, female    DOB: August 26, 1940, 75 y.o.   MRN: [id number]  Patient presents for F/U and Labs  patient here to follow-up bronchitis. She was seen on December 13 by my partner. A that time she had cough with production conestion but denied an shortness of breath or wheezing.  She was presribed Z-Pak as well as Mucinex DM if she worsened. She took 1 tablet of the zpak and had severe GI upset so she stopped , She was also advised to use Sudafed to decongestant.  SHe does seem to come in once a year with bronchitis. Past few days has had continued cough and wheezing, no fever.  She would also like to have thyroid tested, she noticed thinning hair, constipation, fatigue wosened over past few months. She also does not eat iodized salt .       Review Of Systems:  GEN- + fatigue, denies fever, weight loss,weakness, recent illness HEENT- denies eye drainage, change in vision,+ nasal discharge, CVS- denies chest pain, palpitations RESP- denies SOB,+ cough, wheeze ABD- denies N/V, change in stools, abd pain GU- denies dysuria, hematuria, dribbling, incontinence MSK- denies joint pain, muscle aches, injury Neuro- denies headache, dizziness, syncope, seizure activity       Objective:    BP 132/74 mmHg  Pulse 76  Temp(Src) 98.1 F (36.7 C) (Oral)  Resp 16  Ht 5\' 5"  (1.651 m)  Wt 178 lb (80.74 kg)  BMI 29.62 kg/m2  SpO2 97% GEN- NAD, alert and oriented x3 HEENT- PERRL, EOMI, non injected sclera, pink conjunctiva, MMM, oropharynx clear Neck- Supple,LAD CVS- RRR, no murmur RESP-bilat wheeze, no rhonchi, normal WOB EXT- No edema Pulses- Radial - 2+        Assessment & Plan:      Problem List Items Addressed This Visit    None    Visit Diagnoses    Other fatigue    -  Primary    Normal labs this past summer but no TSH seen, check labs and TSH.     Relevant Orders     TSH    CBC with Differential/Platelet    Basic metabolic panel    Acute bronchitis, unspecified organism        Persistant bronchitis now with RAD, will cover with prednisone, albuterol, advised water with mucinex       Note: This dictation was prepared with Dragon dictation along with smaller phrase technology. Any transcriptional errors that result from this process are unintentional.

## 2015-06-02 ENCOUNTER — Encounter: Payer: Self-pay | Admitting: *Deleted

## 2015-06-05 DIAGNOSIS — H43813 Vitreous degeneration, bilateral: Secondary | ICD-10-CM | POA: Diagnosis not present

## 2015-06-05 DIAGNOSIS — H52203 Unspecified astigmatism, bilateral: Secondary | ICD-10-CM | POA: Diagnosis not present

## 2015-06-05 DIAGNOSIS — H04123 Dry eye syndrome of bilateral lacrimal glands: Secondary | ICD-10-CM | POA: Diagnosis not present

## 2015-06-05 DIAGNOSIS — H25813 Combined forms of age-related cataract, bilateral: Secondary | ICD-10-CM | POA: Diagnosis not present

## 2015-06-13 ENCOUNTER — Ambulatory Visit (INDEPENDENT_AMBULATORY_CARE_PROVIDER_SITE_OTHER): Payer: Medicare Other | Admitting: Orthopedic Surgery

## 2015-06-13 ENCOUNTER — Encounter: Payer: Self-pay | Admitting: Orthopedic Surgery

## 2015-06-13 VITALS — BP 149/83 | Ht 65.0 in | Wt 178.0 lb

## 2015-06-13 DIAGNOSIS — M1712 Unilateral primary osteoarthritis, left knee: Secondary | ICD-10-CM | POA: Diagnosis not present

## 2015-06-13 DIAGNOSIS — M23322 Other meniscus derangements, posterior horn of medial meniscus, left knee: Secondary | ICD-10-CM | POA: Diagnosis not present

## 2015-06-13 NOTE — Progress Notes (Signed)
Chief Complaint  Patient presents with  . Follow-up    follow up left knee, discuss surgery    The patient has increasing knee pain. She's artery had an MRI and we note that she had grade 4 cartilage lesion medial compartment with torn medial meniscus. 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 We discussed her clinical symptoms physical findings MRI findings postoperative course expected results. I cautioned her that she may still have pain because of the arthritis and also because her removing cartilage from the arthritic compartment  Time spent 15 minutes  Patient will have surgery left knee arthroscopy medial meniscectomy.

## 2015-06-13 NOTE — Addendum Note (Signed)
Addended by: Baldomero Lamy B on: 06/13/2015 12:22 PM   Modules accepted: Orders

## 2015-06-13 NOTE — Patient Instructions (Signed)
Surgery 06/16/15 LEFT KNEE ARTHROSCOPY MEDIAL MENISECTOMY

## 2015-06-14 ENCOUNTER — Telehealth: Payer: Self-pay | Admitting: *Deleted

## 2015-06-14 ENCOUNTER — Encounter (HOSPITAL_COMMUNITY): Payer: Self-pay

## 2015-06-14 ENCOUNTER — Encounter (HOSPITAL_COMMUNITY)
Admission: RE | Admit: 2015-06-14 | Discharge: 2015-06-14 | Disposition: A | Discharge status: Home / Self Care | Payer: Medicare Other | Source: Ambulatory Visit | Attending: Orthopedic Surgery | Admitting: Orthopedic Surgery

## 2015-06-14 ENCOUNTER — Telehealth: Payer: Self-pay | Admitting: Orthopedic Surgery

## 2015-06-14 ENCOUNTER — Other Ambulatory Visit: Payer: Self-pay

## 2015-06-14 ENCOUNTER — Other Ambulatory Visit: Payer: Self-pay | Admitting: *Deleted

## 2015-06-14 DIAGNOSIS — M1712 Unilateral primary osteoarthritis, left knee: Secondary | ICD-10-CM | POA: Diagnosis not present

## 2015-06-14 DIAGNOSIS — K219 Gastro-esophageal reflux disease without esophagitis: Secondary | ICD-10-CM | POA: Diagnosis not present

## 2015-06-14 DIAGNOSIS — I110 Hypertensive heart disease with heart failure: Secondary | ICD-10-CM | POA: Diagnosis not present

## 2015-06-14 DIAGNOSIS — Z7951 Long term (current) use of inhaled steroids: Secondary | ICD-10-CM | POA: Diagnosis not present

## 2015-06-14 DIAGNOSIS — M23222 Derangement of posterior horn of medial meniscus due to old tear or injury, left knee: Secondary | ICD-10-CM | POA: Diagnosis not present

## 2015-06-14 DIAGNOSIS — Z01812 Encounter for preprocedural laboratory examination: Secondary | ICD-10-CM | POA: Diagnosis not present

## 2015-06-14 DIAGNOSIS — Z79899 Other long term (current) drug therapy: Secondary | ICD-10-CM | POA: Diagnosis not present

## 2015-06-14 DIAGNOSIS — Z87891 Personal history of nicotine dependence: Secondary | ICD-10-CM | POA: Diagnosis not present

## 2015-06-14 DIAGNOSIS — I5030 Unspecified diastolic (congestive) heart failure: Secondary | ICD-10-CM | POA: Diagnosis not present

## 2015-06-14 DIAGNOSIS — Z7982 Long term (current) use of aspirin: Secondary | ICD-10-CM | POA: Diagnosis not present

## 2015-06-14 HISTORY — DX: Gastro-esophageal reflux disease without esophagitis: K21.9

## 2015-06-14 HISTORY — DX: Unspecified asthma, uncomplicated: J45.909

## 2015-06-14 NOTE — Pre-Procedure Instructions (Signed)
Patient given information to sign up for my chart at home. 

## 2015-06-14 NOTE — Telephone Encounter (Signed)
Patient aware prescription sent as requested

## 2015-06-14 NOTE — Telephone Encounter (Signed)
Regarding out-patient surgery scheduled 06/16/15 at [facility], [place], [phone], contacted insurers - for Medicare, primary, no pre-authorization is required, per guidelines; for secondary insurer, [employer], ph# [phone], no pre-authorization is required as well, per Jenny Reichmann B, 06/14/15, 4:58p.m.

## 2015-06-14 NOTE — Telephone Encounter (Signed)
Patient called stating Pre-Op told her she needs a prescription for a walker, patient said her surgery is Friday morning. Patient uses Personal assistant. Please advise patient when this is done [phone]

## 2015-06-14 NOTE — Patient Instructions (Signed)
TEALA KOLBO  06/14/2015     @PREFPERIOPPHARMACY @   Your procedure is scheduled on  06/16/15  Report to [facility] at   850  A.M.  Call this number if you have problems the morning of surgery:  [phone]   Remember:  Do not eat food or drink liquids after midnight.  Take these medicines the morning of surgery with A SIP OF WATER  Metoprolol, prednisone.   Do not wear jewelry, make-up or nail polish.  Do not wear lotions, powders, or perfumes.  You may wear deodorant.  Do not shave 48 hours prior to surgery.  Men may shave face and neck.  Do not bring valuables to the hospital.  [facility] is not responsible for any belongings or valuables.  Contacts, dentures or bridgework may not be worn into surgery.  Leave your suitcase in the car.  After surgery it may be brought to your room.  For patients admitted to the hospital, discharge time will be determined by your treatment team.  Patients discharged the day of surgery will not be allowed to drive home.   Name and phone number of your driver:   family Special instructions:  none  Please read over the following fact sheets that you were given. Pain Booklet, Coughing and Deep Breathing, Surgical Site Infection Prevention, Anesthesia Post-op Instructions and Care and Recovery After Surgery      Meniscus Injury, Arthroscopy Arthroscopy is a surgical procedure that involves the use of a small scope that has a camera and surgical instruments on the end (arthroscope). An arthroscope can be used to repair your meniscus injury.  LET [facility] CARE PROVIDER KNOW ABOUT:  Any allergies you have.  All medicines you are taking, including vitamins, herbs, eyedrops, creams, and over-the-counter medicines.  Any recent colds or infections you have had or currently have.  Previous problems you or members of your family have had with the use of anesthetics.  Any blood disorders or blood clotting problems you  have.  Previous surgeries you have had.  Medical conditions you have. RISKS AND COMPLICATIONS Generally, this is a safe procedure. However, as with any procedure, problems can occur. Possible problems include:  Damage to nerves or blood vessels.  Excess bleeding.  Blood clots.  Infection. BEFORE THE PROCEDURE  Do not eat or drink for 6-8 hours before the procedure.  Take medicines as directed by your surgeon. Ask your surgeon about changing or stopping your regular medicines.  You may have lab tests the morning of surgery. PROCEDURE  You will be given one of the following:   A medicine that numbs the area (local anesthesia).  A medicine that makes you go to sleep (general anesthesia).  A medicine injected into your spine that numbs your body below the waist (spinal anesthesia). Most often, several small cuts (incisions) are made in the knee. The arthroscope and instruments go into the incisions to repair the damage. The torn portion of the meniscus is removed.  During this time, your surgeon may find a partial or complete tear in a cruciate ligament, such as the anterior cruciate ligament (ACL). A completely torn cruciate ligament is reconstructed by taking tissue from another part of the body (grafting) and placing it into the injured area. This requires several larger incisions to complete the repair. Sometimes, open surgery is needed for collateral ligament injuries. If a collateral ligament is found to be injured, your surgeon may staple or suture  the tear through a slightly larger incision on the side of the knee. AFTER THE PROCEDURE You will be taken to the recovery area where your progress will be monitored. When you are awake, stable, and taking fluids without complications, you will be allowed to go home. This is usually the same day. However, more extensive repairs of a ligament may require an overnight stay.  The recovery time after repairing your meniscus or ligament  depends on the amount of damage to these structures. It also depends on whether or not reconstructive knee surgery was needed.   A torn or stretched ligament (ligament sprain) may take 6-8 weeks to heal. It takes about the same amount of time if your surgeon removed a torn meniscus.  A repaired meniscus may require 6-12 weeks of recovery time.  A torn ligament needing reconstructive surgery may take 6-12 months to heal fully.   This information is not intended to replace advice given to you by your health care provider. Make sure you discuss any questions you have with your health care provider.   Document Released: 05/10/2000 Document Revised: 05/18/2013 Document Reviewed: 10/09/2012 Elsevier Interactive Patient Education 2016 [employer]. Arthroscopy, With Meniscus Injury, Care After Refer to this sheet in the next few weeks. These instructions provide you with general information on caring for yourself after your procedure. Your health care provider may also give you specific instructions. Your treatment has been planned according to the current medical practices, but problems sometimes occur. Call your health care provider if you have any problems or questions after your procedure. WHAT TO EXPECT AFTER THE PROCEDURE After your procedure, it is typical to have the following:  Pain and swelling in your knee.  Constipation.  Difficulty walking. HOME CARE INSTRUCTIONS   Use crutches and do knee exercises as directed by your health care provider.  Apply ice to the injured area:  Put ice in a plastic bag.  Place a towel between your skin and the bag.  Leave the ice on for 15-20 minutes, 3-4 times a day while awake. Do this for the first 2 days.  Rest and raise (elevate) your knee.  Change bandages (dressings) as directed by your health care provider.  Keep the wound dry and clean. The wound may be washed gently with soap and water. Gently blot or dab the wound dry. It is okay to  take showers 24-48 hours after surgery. Do not take baths, use swimming pools, or use hot tubs for 14 days, or as directed by your health care provider.  Only take over-the-counter or prescription medicines for pain, discomfort, or fever as directed by your health care provider.  Continue your normal diet as directed by your health care provider.  Do not lift anything more than 10 pounds or play contact sports for 3 weeks, or as directed by your health care provider.  If a brace was applied, use as directed by your health care provider.  Your health care provider will help with instructions for rehabilitation of your knee. SEEK MEDICAL CARE IF:   You have increased bleeding (more than a small spot) from the wound.  You have redness, swelling, or increasing pain in the wound.  Yellowish-white fluid (pus) is coming from your wound. SEEK IMMEDIATE MEDICAL CARE IF:   You develop a rash.  You have a fever or persistent symptoms for more than 2-3 days.  You have difficulty breathing.  You have increasing pain with movement of the knee. MAKE SURE YOU:  Understand these instructions.  Will watch your condition.  Will get help right away if you are not doing well or get worse.   This information is not intended to replace advice given to you by your health care provider. Make sure you discuss any questions you have with your health care provider.   Document Released: 11/30/2004 Document Revised: 01/13/2013 Document Reviewed: 10/20/2012 Elsevier Interactive Patient Education 2016 [employer] PATIENT INSTRUCTIONS POST-ANESTHESIA  IMMEDIATELY FOLLOWING SURGERY:  Do not drive or operate machinery for the first twenty four hours after surgery.  Do not make any important decisions for twenty four hours after surgery or while taking narcotic pain medications or sedatives.  If you develop intractable nausea and vomiting or a severe headache please notify your doctor  immediately.  FOLLOW-UP:  Please make an appointment with your surgeon as instructed. You do not need to follow up with anesthesia unless specifically instructed to do so.  WOUND CARE INSTRUCTIONS (if applicable):  Keep a dry clean dressing on the anesthesia/puncture wound site if there is drainage.  Once the wound has quit draining you may leave it open to air.  Generally you should leave the bandage intact for twenty four hours unless there is drainage.  If the epidural site drains for more than 36-48 hours please call the anesthesia department.  QUESTIONS?:  Please feel free to call your physician or the hospital operator if you have any questions, and they will be happy to assist you.

## 2015-06-15 NOTE — H&P (Addendum)
Laura Carter is an 75 y.o. female.    Chief Complaint: Left knee pain  HPI: 75 year old female long-standing problems over the last year regarding her left knee. She was treated with the usual nonoperative measures including Tylenol therapy injection. She did not want to take anti-inflammatories because of reflux. Her initial symptoms waxed and waned after treatment but now have become more prominent and involves long mechanical symptoms and pain in the medial joint line and posterior lateral knee which may be radicular in nature but the medial pain and mechanical symptoms were related to her knee, and this was documented with MRI which showed full thickness cartilage lesion medial femoral condyle and a torn medial meniscus  She presents now for arthroscopic partial medial meniscectomy  Past Medical History  Diagnosis Date  . Diastolic dysfunction   . Hypertension   . Asthma   . GERD (gastroesophageal reflux disease)     Past Surgical History  Procedure Laterality Date  . Cholecystectomy  10/10    Family History  Problem Relation Age of Onset  . Cancer Mother     breast  . Heart disease Father   . Heart disease Sister    Social History:  reports that she quit smoking about 50 years ago. Her smoking use included Cigarettes. She has a 10 pack-year smoking history. She does not have any smokeless tobacco history on file. She reports that she does not drink alcohol or use illicit drugs.  Allergies: No Known Allergies  No prescriptions prior to admission    No results found for this or any previous visit (from the past 48 hour(s)). No results found.  Review of Systems  Constitutional: Negative for fever.  HENT: Negative for nosebleeds.   Eyes: Negative for discharge.  Respiratory: Negative for cough.   Cardiovascular: Negative for palpitations.  Gastrointestinal: Negative for abdominal pain.  Genitourinary: Negative for urgency.  Musculoskeletal: Positive for joint pain.   Skin: Negative for rash.  Neurological: Negative for sensory change.  Endo/Heme/Allergies: Does not bruise/bleed easily.  Psychiatric/Behavioral: Negative for hallucinations.    There were no vitals taken for this visit. Physical Exam  Constitutional: She is oriented to person, place, and time. She appears well-developed and well-nourished. No distress.  HENT:  Head: Normocephalic and atraumatic.  Mouth/Throat: No oropharyngeal exudate.  Eyes: Conjunctivae and EOM are normal. Pupils are equal, round, and reactive to light. Right eye exhibits no discharge. Left eye exhibits no discharge. No scleral icterus.  Neck: Normal range of motion. Neck supple. No JVD present. Tracheal deviation present. No thyromegaly present.  Cardiovascular: Normal rate.   Respiratory: Effort normal. No stridor. She has no wheezes.  GI: She exhibits no distension.  Musculoskeletal:  Normal range of motion of the right and left upper extremity, with no swelling or tenderness all joints produce stable without subluxation muscle tone normal skin warm dry and intact neurovascular exam normal    Lymphadenopathy:    She has no cervical adenopathy.  Neurological: She is alert and oriented to person, place, and time. She has normal reflexes. She displays normal reflexes. No cranial nerve deficit. Coordination normal.  Skin: Skin is warm and dry. No rash noted. She is not diaphoretic. No erythema. No pallor.  Psychiatric: She has a normal mood and affect. Her behavior is normal. Judgment normal.    Left knee. The patient is ambulatory without any assistive devices or limp. Inspection reveals tenderness along the medial joint line posterior lateral knee there is no joint effusion. Her  range of motion shows a slight flexion contracture but she can flex the knee past 125 all ligaments were examined and were stable. Her motor exam is normal. Her McMurray sign was positive. Medial joint line tenderness.  Assessment/Plan   MRI IMPRESSION: 1. Complete radial tear of the root of the posterior horn of the medial meniscus with a incomplete radial tear and longitudinal tear of the midbody. 2. Full-thickness cartilage loss in the medial compartment. 3. Large joint effusion with small Baker's cyst.     Electronically Signed   By: Lorriane Shire M.D.   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 She has a full grade for thickness lesion on MRI over the medial femoral condyle.  Salk, med menisectomy  We discussed this in terms of postoperative expectations and possibility of continuing pain from the arthritis  We discussed possible need for knee replacement in the future  Also risks and benefits of surgery were explained alternative treatments which include nonoperative treatment but she declined.  Arther Abbott 06/15/2015, 11:53 AM

## 2015-06-16 ENCOUNTER — Ambulatory Visit (HOSPITAL_COMMUNITY): Payer: Medicare Other | Admitting: Anesthesiology

## 2015-06-16 ENCOUNTER — Ambulatory Visit (HOSPITAL_COMMUNITY)
Admission: RE | Admit: 2015-06-16 | Discharge: 2015-06-16 | Disposition: A | Discharge status: Home / Self Care | Payer: Medicare Other | Source: Ambulatory Visit | Attending: Orthopedic Surgery | Admitting: Orthopedic Surgery

## 2015-06-16 ENCOUNTER — Encounter (HOSPITAL_COMMUNITY): Payer: Self-pay | Admitting: *Deleted

## 2015-06-16 ENCOUNTER — Encounter (HOSPITAL_COMMUNITY)
Admission: RE | Disposition: A | Discharge status: Home / Self Care | Payer: Self-pay | Source: Ambulatory Visit | Attending: Orthopedic Surgery

## 2015-06-16 DIAGNOSIS — I5030 Unspecified diastolic (congestive) heart failure: Secondary | ICD-10-CM | POA: Diagnosis not present

## 2015-06-16 DIAGNOSIS — Z01812 Encounter for preprocedural laboratory examination: Secondary | ICD-10-CM | POA: Insufficient documentation

## 2015-06-16 DIAGNOSIS — M129 Arthropathy, unspecified: Secondary | ICD-10-CM

## 2015-06-16 DIAGNOSIS — Z79899 Other long term (current) drug therapy: Secondary | ICD-10-CM | POA: Insufficient documentation

## 2015-06-16 DIAGNOSIS — I110 Hypertensive heart disease with heart failure: Secondary | ICD-10-CM | POA: Diagnosis not present

## 2015-06-16 DIAGNOSIS — M23322 Other meniscus derangements, posterior horn of medial meniscus, left knee: Secondary | ICD-10-CM

## 2015-06-16 DIAGNOSIS — Z7951 Long term (current) use of inhaled steroids: Secondary | ICD-10-CM | POA: Insufficient documentation

## 2015-06-16 DIAGNOSIS — M23329 Other meniscus derangements, posterior horn of medial meniscus, unspecified knee: Secondary | ICD-10-CM | POA: Insufficient documentation

## 2015-06-16 DIAGNOSIS — M23222 Derangement of posterior horn of medial meniscus due to old tear or injury, left knee: Secondary | ICD-10-CM | POA: Diagnosis not present

## 2015-06-16 DIAGNOSIS — M1710 Unilateral primary osteoarthritis, unspecified knee: Secondary | ICD-10-CM | POA: Insufficient documentation

## 2015-06-16 DIAGNOSIS — Z7982 Long term (current) use of aspirin: Secondary | ICD-10-CM | POA: Insufficient documentation

## 2015-06-16 DIAGNOSIS — K219 Gastro-esophageal reflux disease without esophagitis: Secondary | ICD-10-CM | POA: Diagnosis not present

## 2015-06-16 DIAGNOSIS — M1712 Unilateral primary osteoarthritis, left knee: Secondary | ICD-10-CM | POA: Insufficient documentation

## 2015-06-16 DIAGNOSIS — Z87891 Personal history of nicotine dependence: Secondary | ICD-10-CM | POA: Insufficient documentation

## 2015-06-16 DIAGNOSIS — S83242A Other tear of medial meniscus, current injury, left knee, initial encounter: Secondary | ICD-10-CM | POA: Diagnosis not present

## 2015-06-16 HISTORY — PX: KNEE ARTHROSCOPY WITH MEDIAL MENISECTOMY: SHX5651

## 2015-06-16 SURGERY — ARTHROSCOPY, KNEE, WITH MEDIAL MENISCECTOMY
Anesthesia: General | Site: Knee | Laterality: Left

## 2015-06-16 MED ORDER — DEXAMETHASONE SODIUM PHOSPHATE 4 MG/ML IJ SOLN
INTRAMUSCULAR | Status: AC
  Filled 2015-06-16: qty 1

## 2015-06-16 MED ORDER — PHENYLEPHRINE 40 MCG/ML (10ML) SYRINGE FOR IV PUSH (FOR BLOOD PRESSURE SUPPORT)
PREFILLED_SYRINGE | INTRAVENOUS | Status: AC
  Filled 2015-06-16: qty 10

## 2015-06-16 MED ORDER — EPINEPHRINE HCL 1 MG/ML IJ SOLN
INTRAMUSCULAR | Status: AC
  Filled 2015-06-16: qty 5

## 2015-06-16 MED ORDER — BUPIVACAINE-EPINEPHRINE (PF) 0.5% -1:200000 IJ SOLN
INTRAMUSCULAR | Status: AC
  Filled 2015-06-16: qty 60

## 2015-06-16 MED ORDER — MIDAZOLAM HCL 2 MG/2ML IJ SOLN
1.0000 mg | INTRAMUSCULAR | Status: DC | PRN
  Administered 2015-06-16: 2 mg via INTRAVENOUS

## 2015-06-16 MED ORDER — MIDAZOLAM HCL 2 MG/2ML IJ SOLN
INTRAMUSCULAR | Status: AC
  Filled 2015-06-16: qty 2

## 2015-06-16 MED ORDER — HYDROCODONE-ACETAMINOPHEN 5-325 MG PO TABS: 1.0000 | ORAL_TABLET | ORAL | Status: DC | PRN

## 2015-06-16 MED ORDER — SUCCINYLCHOLINE CHLORIDE 20 MG/ML IJ SOLN
INTRAMUSCULAR | Status: AC
  Filled 2015-06-16: qty 1

## 2015-06-16 MED ORDER — FENTANYL CITRATE (PF) 100 MCG/2ML IJ SOLN
INTRAMUSCULAR | Status: AC
  Filled 2015-06-16: qty 2

## 2015-06-16 MED ORDER — SODIUM CHLORIDE 0.9 % IR SOLN
Status: DC | PRN
  Administered 2015-06-16 (×4): 3000 mL

## 2015-06-16 MED ORDER — SODIUM CHLORIDE 0.9 % IJ SOLN
INTRAMUSCULAR | Status: AC
  Filled 2015-06-16: qty 10

## 2015-06-16 MED ORDER — HYDROCODONE-ACETAMINOPHEN 5-325 MG PO TABS
ORAL_TABLET | ORAL | Status: AC
  Filled 2015-06-16: qty 1

## 2015-06-16 MED ORDER — EPHEDRINE SULFATE 50 MG/ML IJ SOLN
INTRAMUSCULAR | Status: DC | PRN
  Administered 2015-06-16: 10 mg via INTRAVENOUS

## 2015-06-16 MED ORDER — LIDOCAINE HCL (PF) 1 % IJ SOLN
INTRAMUSCULAR | Status: AC
  Filled 2015-06-16: qty 5

## 2015-06-16 MED ORDER — CEFAZOLIN SODIUM-DEXTROSE 2-3 GM-% IV SOLR
2.0000 g | INTRAVENOUS | Status: AC
  Administered 2015-06-16: 2 g via INTRAVENOUS
  Filled 2015-06-16: qty 50

## 2015-06-16 MED ORDER — ONDANSETRON HCL 4 MG/2ML IJ SOLN
4.0000 mg | Freq: Once | INTRAMUSCULAR | Status: AC
  Administered 2015-06-16: 4 mg via INTRAVENOUS

## 2015-06-16 MED ORDER — FENTANYL CITRATE (PF) 250 MCG/5ML IJ SOLN
INTRAMUSCULAR | Status: AC
  Filled 2015-06-16: qty 5

## 2015-06-16 MED ORDER — FENTANYL CITRATE (PF) 250 MCG/5ML IJ SOLN
INTRAMUSCULAR | Status: DC | PRN
  Administered 2015-06-16: 25 ug via INTRAVENOUS
  Administered 2015-06-16: 50 ug via INTRAVENOUS
  Administered 2015-06-16: 25 ug via INTRAVENOUS

## 2015-06-16 MED ORDER — ONDANSETRON HCL 4 MG/2ML IJ SOLN
INTRAMUSCULAR | Status: AC
  Filled 2015-06-16: qty 2

## 2015-06-16 MED ORDER — CHLORHEXIDINE GLUCONATE 4 % EX LIQD: 60.0000 mL | Freq: Once | CUTANEOUS | Status: DC

## 2015-06-16 MED ORDER — ONDANSETRON HCL 4 MG/2ML IJ SOLN: 4.0000 mg | Freq: Once | INTRAMUSCULAR | Status: DC | PRN

## 2015-06-16 MED ORDER — PROPOFOL 10 MG/ML IV BOLUS
INTRAVENOUS | Status: DC | PRN
  Administered 2015-06-16: 120 mg via INTRAVENOUS

## 2015-06-16 MED ORDER — ATROPINE SULFATE 0.4 MG/ML IJ SOLN
INTRAMUSCULAR | Status: AC
  Filled 2015-06-16: qty 1

## 2015-06-16 MED ORDER — FENTANYL CITRATE (PF) 100 MCG/2ML IJ SOLN: 25.0000 ug | INTRAMUSCULAR | Status: DC | PRN

## 2015-06-16 MED ORDER — KETOROLAC TROMETHAMINE 30 MG/ML IJ SOLN
30.0000 mg | Freq: Once | INTRAMUSCULAR | Status: AC
  Administered 2015-06-16: 30 mg via INTRAVENOUS

## 2015-06-16 MED ORDER — LIDOCAINE HCL 1 % IJ SOLN
INTRAMUSCULAR | Status: DC | PRN
  Administered 2015-06-16: 40 mg via INTRADERMAL

## 2015-06-16 MED ORDER — LACTATED RINGERS IV SOLN
INTRAVENOUS | Status: DC
  Administered 2015-06-16 (×2): via INTRAVENOUS

## 2015-06-16 MED ORDER — KETOROLAC TROMETHAMINE 30 MG/ML IJ SOLN
INTRAMUSCULAR | Status: AC
  Filled 2015-06-16: qty 1

## 2015-06-16 MED ORDER — HYDROCODONE-ACETAMINOPHEN 5-325 MG PO TABS
1.0000 | ORAL_TABLET | Freq: Once | ORAL | Status: AC
Start: 2015-06-16 — End: 2015-06-16
  Administered 2015-06-16: 1 via ORAL

## 2015-06-16 MED ORDER — BUPIVACAINE-EPINEPHRINE (PF) 0.5% -1:200000 IJ SOLN
INTRAMUSCULAR | Status: DC | PRN
  Administered 2015-06-16: 30 mL

## 2015-06-16 MED ORDER — PROMETHAZINE HCL 25 MG PO TABS: 25.0000 mg | ORAL_TABLET | Freq: Four times a day (QID) | ORAL | Status: DC | PRN

## 2015-06-16 SURGICAL SUPPLY — 55 items
ARTHROWAND PARAGON T2 (SURGICAL WAND)
BAG HAMPER (MISCELLANEOUS) ×3 IMPLANT
BANDAGE ELASTIC 6 VELCRO NS (GAUZE/BANDAGES/DRESSINGS) ×3 IMPLANT
BLADE AGGRESSIVE PLUS 4.0 (BLADE) ×3 IMPLANT
BLADE SURG SZ11 CARB STEEL (BLADE) ×3 IMPLANT
CHLORAPREP W/TINT 26ML (MISCELLANEOUS) ×4 IMPLANT
CLOTH BEACON ORANGE TIMEOUT ST (SAFETY) ×3 IMPLANT
COOLER CRYO IC GRAV AND TUBE (ORTHOPEDIC SUPPLIES) ×3 IMPLANT
CUFF CRYO KNEE LG 20X31 COOLER (ORTHOPEDIC SUPPLIES) IMPLANT
CUFF CRYO KNEE18X23 MED (MISCELLANEOUS) ×2 IMPLANT
CUFF TOURNIQUET SINGLE 34IN LL (TOURNIQUET CUFF) ×2 IMPLANT
CUFF TOURNIQUET SINGLE 44IN (TOURNIQUET CUFF) IMPLANT
CUTTER ANGLED DBL BITE 4.5 (BURR) IMPLANT
DECANTER SPIKE VIAL GLASS SM (MISCELLANEOUS) ×6 IMPLANT
GAUZE SPONGE 4X4 12PLY STRL (GAUZE/BANDAGES/DRESSINGS) ×3 IMPLANT
GAUZE SPONGE 4X4 16PLY XRAY LF (GAUZE/BANDAGES/DRESSINGS) ×3 IMPLANT
GAUZE XEROFORM 5X9 LF (GAUZE/BANDAGES/DRESSINGS) ×3 IMPLANT
GLOVE BIOGEL PI IND STRL 7.0 (GLOVE) ×1 IMPLANT
GLOVE BIOGEL PI INDICATOR 7.0 (GLOVE) ×2
GLOVE SKINSENSE NS SZ8.0 LF (GLOVE) ×2
GLOVE SKINSENSE STRL SZ8.0 LF (GLOVE) ×1 IMPLANT
GLOVE SS N UNI LF 8.5 STRL (GLOVE) ×3 IMPLANT
GOWN STRL REUS W/ TWL LRG LVL3 (GOWN DISPOSABLE) ×1 IMPLANT
GOWN STRL REUS W/TWL LRG LVL3 (GOWN DISPOSABLE) ×3
GOWN STRL REUS W/TWL XL LVL3 (GOWN DISPOSABLE) ×3 IMPLANT
HLDR LEG FOAM (MISCELLANEOUS) ×1 IMPLANT
IV NS IRRIG 3000ML ARTHROMATIC (IV SOLUTION) ×10 IMPLANT
KIT BLADEGUARD II DBL (SET/KITS/TRAYS/PACK) ×3 IMPLANT
KIT ROOM TURNOVER AP CYSTO (KITS) ×3 IMPLANT
LEG HOLDER FOAM (MISCELLANEOUS) ×2
MANIFOLD NEPTUNE II (INSTRUMENTS) ×3 IMPLANT
MARKER SKIN DUAL TIP RULER LAB (MISCELLANEOUS) ×3 IMPLANT
NDL HYPO 18GX1.5 BLUNT FILL (NEEDLE) ×1 IMPLANT
NDL HYPO 21X1.5 SAFETY (NEEDLE) ×1 IMPLANT
NDL SPNL 18GX3.5 QUINCKE PK (NEEDLE) ×1 IMPLANT
NEEDLE HYPO 18GX1.5 BLUNT FILL (NEEDLE) ×3 IMPLANT
NEEDLE HYPO 21X1.5 SAFETY (NEEDLE) ×3 IMPLANT
NEEDLE SPNL 18GX3.5 QUINCKE PK (NEEDLE) ×3 IMPLANT
NS IRRIG 1000ML POUR BTL (IV SOLUTION) ×3 IMPLANT
PACK ARTHRO LIMB DRAPE STRL (MISCELLANEOUS) ×3 IMPLANT
PAD ABD 5X9 TENDERSORB (GAUZE/BANDAGES/DRESSINGS) ×3 IMPLANT
PAD ARMBOARD 7.5X6 YLW CONV (MISCELLANEOUS) ×3 IMPLANT
PADDING CAST COTTON 6X4 STRL (CAST SUPPLIES) ×3 IMPLANT
PADDING WEBRIL 6 STERILE (GAUZE/BANDAGES/DRESSINGS) ×2 IMPLANT
SET ARTHROSCOPY INST (INSTRUMENTS) ×3 IMPLANT
SET ARTHROSCOPY PUMP TUBE (IRRIGATION / IRRIGATOR) ×3 IMPLANT
SET BASIN LINEN APH (SET/KITS/TRAYS/PACK) ×3 IMPLANT
SPONGE GAUZE 4X4 12PLY (GAUZE/BANDAGES/DRESSINGS) ×1 IMPLANT
SUT ETHILON 3 0 FSL (SUTURE) IMPLANT
SYR 30ML LL (SYRINGE) ×3 IMPLANT
SYRINGE 10CC LL (SYRINGE) ×3 IMPLANT
WAND 50 DEG COVAC W/CORD (SURGICAL WAND) ×2 IMPLANT
WAND 90 DEG TURBOVAC W/CORD (SURGICAL WAND) IMPLANT
WAND ARTHRO PARAGON T2 (SURGICAL WAND) IMPLANT
YANKAUER SUCT BULB TIP 10FT TU (MISCELLANEOUS) ×9 IMPLANT

## 2015-06-16 NOTE — Interval H&P Note (Signed)
History and Physical Interval Note:  06/16/2015 9:40 AM  Laura Carter  has presented today for surgery, with the diagnosis of left knee meniscus tear  The various methods of treatment have been discussed with the patient and family. After consideration of risks, benefits and other options for treatment, the patient has consented to  Procedure(s) with comments: KNEE ARTHROSCOPY WITH MEDIAL MENISECTOMY (Left) - Pt notified of new arrival time 8:00am as a surgical intervention .  The patient's history has been reviewed, patient examined, no change in status, stable for surgery.  I have reviewed the patient's chart and labs.  Questions were answered to the patient's satisfaction.     Arther Abbott

## 2015-06-16 NOTE — Anesthesia Postprocedure Evaluation (Signed)
Anesthesia Post Note  Patient: Mary B Youtz  Procedure(s) Performed: Procedure(s) (LRB): KNEE ARTHROSCOPY WITH PARTIAL MEDIAL MENISECTOMY (Left)  Patient location during evaluation: PACU Anesthesia Type: General Level of consciousness: awake and alert and oriented Pain management: pain level controlled Vital Signs Assessment: post-procedure vital signs reviewed and stable Respiratory status: spontaneous breathing Cardiovascular status: blood pressure returned to baseline Postop Assessment: no signs of nausea or vomiting Anesthetic complications: no    Last Vitals:  Filed Vitals:   06/16/15 1225 06/16/15 1245  BP:  133/64  Pulse: 62 61  Temp:  36.5 C  Resp:  16    Last Pain: There were no vitals filed for this visit.               Montrae Braithwaite

## 2015-06-16 NOTE — Brief Op Note (Signed)
06/16/2015  11:02 AM  PATIENT:  Laura Carter  75 y.o. female  PRE-OPERATIVE DIAGNOSIS:  left knee meniscus tear  POST-OPERATIVE DIAGNOSIS:  left knee meniscus tear, arthritis left knee  PROCEDURE:  Procedure(s): KNEE ARTHROSCOPY WITH MEDIAL MENISECTOMY (Left)  Findings at surgery  Starting in the medial compartment grade 4 femoral and tibial changes and complex tear posterior horn medial meniscus Notch area tibial spine bone spur anterior cruciate ligament PCL intact Lateral compartment grade 2 fissure lateral femoral condyle degenerative fraying lateral meniscus no tear Patellofemoral compartment and trochlea grade 2 changes on each side of the joint and a compensatory spur and hypertrophy of the medial facet  Anesthesia Gen.  Assistants none SURGEON:  Surgeon(s) and Role:    * Carole Civil, MD - Primary EBL:  Total I/O In: 700 [I.V.:700] Out: -   BLOOD ADMINISTERED:none  DRAINS: none   LOCAL MEDICATIONS USED: Marcaine with epinephrine 60 mL  SPECIMEN:  No Specimen  DISPOSITION OF SPECIMEN:  N/A  COUNTS:  YES  TOURNIQUET:    DICTATION: .Dragon Dictation  PLAN OF CARE: Discharge to home after PACU  PATIENT DISPOSITION:  PACU - hemodynamically stable.   Delay start of Pharmacological VTE agent (>24hrs) due to surgical blood loss or risk of bleeding: not applicable

## 2015-06-16 NOTE — Op Note (Signed)
Operative note for 06/16/2015  75 year old female with torn medial meniscus and increasing pain  Preop diagnosis torn medial meniscus left knee Postop diagnosis torn medial meniscus left knee arthritis left knee Procedure arthroscopy left knee partial medial meniscectomy Surgeon [name] No assistants Gen. 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  Timeout completed   Lateral portal established scope placed into the joint diagnostic arthroscopy done by circumferentially going in each compartment of the knee. Findings noted above.  Medial portal established. Medial meniscus probed tear identified as a posterior horn root tear radial and then a vertical tear in the remaining portion of the posterior horn of the meniscus degenerative changes in the meniscus. Chondral changes of the femur noted above  Combination of arthroscopic instruments and ArthroCare wand and shaver were used to remove the meniscal tear and balanced the meniscus until a stable rim which was confirmed by probe  The knee was thoroughly irrigated and any meniscal fragments were removed  The knee is incisions were closed with 3-0 nylon sutures and 60 mL of Marcaine was injected into the joint  Sterile  bandages applied Cryo/Cuff applied and activated  Patient exhibited taken recovery in stable condition  Postop plan weightbearing as tolerated 3-4 weeks of range of motion exercises and strengthening  Patient will need knee replacement in the future.

## 2015-06-16 NOTE — Anesthesia Preprocedure Evaluation (Signed)
Anesthesia Evaluation  Patient identified by MRN, date of birth, ID band Patient awake    Reviewed: Allergy & Precautions, NPO status , Patient's Chart, lab work & pertinent test results, reviewed documented beta blocker date and time   Airway Mallampati: II  TM Distance: <3 FB Neck ROM: Full    Dental  (+) Teeth Intact, Caps   Pulmonary asthma , former smoker,    Pulmonary exam normal        Cardiovascular hypertension, Pt. on medications and Pt. on home beta blockers Normal cardiovascular exam     Neuro/Psych    GI/Hepatic GERD  Medicated and Controlled,  Endo/Other    Renal/GU      Musculoskeletal  (+) Arthritis , Osteoarthritis,    Abdominal Normal abdominal exam  (+)   Peds  Hematology   Anesthesia Other Findings   Reproductive/Obstetrics                             Anesthesia Physical Anesthesia Plan  ASA: III  Anesthesia Plan: General   Post-op Pain Management:    Induction: Intravenous  Airway Management Planned: LMA  Additional Equipment:   Intra-op Plan:   Post-operative Plan: Extubation in OR  Informed Consent: I have reviewed the patients History and Physical, chart, labs and discussed the procedure including the risks, benefits and alternatives for the proposed anesthesia with the patient or authorized representative who has indicated his/her understanding and acceptance.   Dental advisory given  Plan Discussed with: CRNA  Anesthesia Plan Comments:         Anesthesia Quick Evaluation

## 2015-06-16 NOTE — Transfer of Care (Signed)
Immediate Anesthesia Transfer of Care Note  Patient: Laura Carter  Procedure(s) Performed: Procedure(s): KNEE ARTHROSCOPY WITH PARTIAL MEDIAL MENISECTOMY (Left)  Patient Location: PACU  Anesthesia Type:General  Level of Consciousness: awake, alert  and oriented  Airway & Oxygen Therapy: Patient Spontanous Breathing and Patient connected to face mask oxygen  Post-op Assessment: Report given to RN  Post vital signs: Reviewed and stable  Last Vitals:  Filed Vitals:   06/16/15 0945 06/16/15 0950  BP: 133/79 133/73  Temp:    Resp: 15 15    Complications: No apparent anesthesia complications

## 2015-06-16 NOTE — Anesthesia Procedure Notes (Signed)
Procedure Name: LMA Insertion Date/Time: 06/16/2015 10:04 AM Performed by: Tressie Stalker E Pre-anesthesia Checklist: Patient identified, Patient being monitored, Emergency Drugs available, Timeout performed and Suction available Patient Re-evaluated:Patient Re-evaluated prior to inductionOxygen Delivery Method: Circle System Utilized Preoxygenation: Pre-oxygenation with 100% oxygen Intubation Type: IV induction Ventilation: Mask ventilation without difficulty LMA: LMA inserted LMA Size: 4.0 Number of attempts: 1 Placement Confirmation: positive ETCO2 and breath sounds checked- equal and bilateral

## 2015-06-16 NOTE — Discharge Instructions (Signed)
Arthroscopy, With Meniscus Injury, Care After Refer to this sheet in the next few weeks. These instructions provide you with general information on caring for yourself after your procedure. Your health care provider may also give you specific instructions. Your treatment has been planned according to the current medical practices, but problems sometimes occur. Call your health care provider if you have any problems or questions after your procedure. WHAT TO EXPECT AFTER THE PROCEDURE After your procedure, it is typical to have the following:  Pain and swelling in your knee.  Constipation.  Difficulty walking. HOME CARE INSTRUCTIONS   Use crutches and do knee exercises as directed by your health care provider.  Apply ice to the injured area:  Put ice in a plastic bag.  Place a towel between your skin and the bag.  Leave the ice on for 15-20 minutes, 3-4 times a day while awake. Do this for the first 2 days.  Rest and raise (elevate) your knee.  Change bandages (dressings) as directed by your health care provider.  Keep the wound dry and clean. The wound may be washed gently with soap and water. Gently blot or dab the wound dry. It is okay to take showers 24-48 hours after surgery. Do not take baths, use swimming pools, or use hot tubs for 14 days, or as directed by your health care provider.  Only take over-the-counter or prescription medicines for pain, discomfort, or fever as directed by your health care provider.  Continue your normal diet as directed by your health care provider.  Do not lift anything more than 10 pounds or play contact sports for 3 weeks, or as directed by your health care provider.  If a brace was applied, use as directed by your health care provider.  Your health care provider will help with instructions for rehabilitation of your knee. SEEK MEDICAL CARE IF:   You have increased bleeding (more than a small spot) from the wound.  You have redness,  swelling, or increasing pain in the wound.  Yellowish-white fluid (pus) is coming from your wound. SEEK IMMEDIATE MEDICAL CARE IF:   You develop a rash.  You have a fever or persistent symptoms for more than 2-3 days.  You have difficulty breathing.  You have increasing pain with movement of the knee. MAKE SURE YOU:   Understand these instructions.  Will watch your condition.  Will get help right away if you are not doing well or get worse.   This information is not intended to replace advice given to you by your health care provider. Make sure you discuss any questions you have with your health care provider.   Document Released: 11/30/2004 Document Revised: 01/13/2013 Document Reviewed: 10/20/2012 Elsevier Interactive Patient Education [employer].

## 2015-06-19 ENCOUNTER — Ambulatory Visit (INDEPENDENT_AMBULATORY_CARE_PROVIDER_SITE_OTHER): Payer: Self-pay | Admitting: Orthopedic Surgery

## 2015-06-19 ENCOUNTER — Encounter (HOSPITAL_COMMUNITY): Payer: Self-pay | Admitting: Orthopedic Surgery

## 2015-06-19 VITALS — BP 81/54 | Ht 65.0 in | Wt 180.0 lb

## 2015-06-19 DIAGNOSIS — Z9889 Other specified postprocedural states: Secondary | ICD-10-CM

## 2015-06-19 NOTE — Patient Instructions (Signed)
Call [facility] therapy dept to schedule therapy visits 

## 2015-06-19 NOTE — Progress Notes (Signed)
Patient ID: Laura Carter, female   DOB: 03/13/1941, 75 y.o.   MRN: [id number]   POST OP VISIT  S/P KNEE ARTHROSCOPY  Chief Complaint  Patient presents with  . Follow-up    Post op #1, left knee, SALK, DOS 06-16-15.     BP 81/54 mmHg  Ht 5\' 5"  (1.651 m)  Wt 180 lb (81.647 kg)  BMI 29.95 kg/m2  The surgical sites look clean dry and intact.    ASSESSMENT AND PLAN   Start physical therapy and arrange follow-up visit   75 year old female with torn medial meniscus and increasing pain  Preop diagnosis torn medial meniscus left knee Postop diagnosis torn medial meniscus left knee arthritis left knee Procedure arthroscopy left knee partial medial meniscectomy Surgeon [name] No assistants Gen. 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

## 2015-06-21 ENCOUNTER — Ambulatory Visit (HOSPITAL_COMMUNITY): Payer: Medicare Other | Attending: Orthopedic Surgery | Admitting: Physical Therapy

## 2015-06-21 DIAGNOSIS — M25562 Pain in left knee: Secondary | ICD-10-CM | POA: Diagnosis not present

## 2015-06-21 DIAGNOSIS — M25662 Stiffness of left knee, not elsewhere classified: Secondary | ICD-10-CM | POA: Diagnosis not present

## 2015-06-21 DIAGNOSIS — R262 Difficulty in walking, not elsewhere classified: Secondary | ICD-10-CM | POA: Insufficient documentation

## 2015-06-21 DIAGNOSIS — Z9889 Other specified postprocedural states: Secondary | ICD-10-CM | POA: Diagnosis not present

## 2015-06-21 DIAGNOSIS — R600 Localized edema: Secondary | ICD-10-CM | POA: Diagnosis not present

## 2015-06-21 NOTE — Therapy (Signed)
[place], [place], [address] Phone: [phone]   Fax:  [phone]  Physical Therapy Evaluation  Patient Details  Name: Laura Carter MRN: [id number] Date of Birth: 1940-08-02 Referring Provider: Dr. Arther Abbott   Encounter Date: 06/21/2015      PT End of Session - 06/21/15 1740    Visit Number 1   Number of Visits 18   Date for PT Re-Evaluation 07/19/15   Authorization Type Medicare/BCBS Supplemental    Authorization Time Period 06/21/15 to 08/19/15   Authorization - Visit Number 1   Authorization - Number of Visits 10   PT Start Time [id number]   PT Stop Time 1730   PT Time Calculation (min) 44 min   Activity Tolerance Patient tolerated treatment well;Patient limited by pain   Behavior During Therapy [facility] for tasks assessed/performed      Past Medical History  Diagnosis Date  . Diastolic dysfunction   . Hypertension   . Asthma   . GERD (gastroesophageal reflux disease)     Past Surgical History  Procedure Laterality Date  . Cholecystectomy  10/10  . Knee arthroscopy with medial menisectomy Left 06/16/2015    Procedure: KNEE ARTHROSCOPY WITH PARTIAL MEDIAL MENISECTOMY;  Surgeon: Carole Civil, MD;  Location: [facility];  Service: Orthopedics;  Laterality: Left;    There were no vitals filed for this visit.  Visit Diagnosis:  S/P left knee arthroscopy - Plan: PT plan of care cert/re-cert  Knee stiffness, left - Plan: PT plan of care cert/re-cert  Left knee pain - Plan: PT plan of care cert/re-cert  Localized edema - Plan: PT plan of care cert/re-cert  Difficulty walking - Plan: PT plan of care cert/re-cert      Subjective Assessment - 06/21/15 1651    Subjective Knee is stiff and has some swelling still; she has been icing it. It is just uncomfortable. Andreas Blower she did a lot of moving/walking around without walker and it has been more achey since.    Pertinent History Patient reports that  her knee pain has gone on for awhile; she saw Dr. [name] the fall of 2016, and was told that she had arthritis and a possible meniscus tear. Knee just kept progressing and she began having constant pain late last year. Had arthoscopy performed on Friday the 20th.    How long can you stand comfortably? 30 minutes    How long can you walk comfortably? with walker, no limits; without walker household distances    Patient Stated Goals get range and strength, get back to yoga    Currently in Pain? Yes   Pain Score 3    Pain Location Knee   Pain Orientation Left   Pain Descriptors / Indicators Aching   Pain Type Surgical pain   Pain Onset In the past 7 days   Aggravating Factors  lifting leg up and getting it into a good position, getting it out of car    Pain Relieving Factors movement             [facility] PT Assessment - 06/21/15 0001    Assessment   Medical Diagnosis s/p L knee arthoscopy    Referring Provider Dr. Arther Abbott    Onset Date/Surgical Date 06/16/15   Next MD Visit 23rd of February    Precautions   Precautions None   Restrictions   Weight Bearing Restrictions No   Balance Screen   Has the patient fallen in the  past 6 months No   Has the patient had a decrease in activity level because of a fear of falling?  No   Is the patient reluctant to leave their home because of a fear of falling?  No   Prior Function   Level of Independence Independent;Independent with basic ADLs;Independent with gait;Independent with transfers   Vocation Retired   Leisure yoga, massages, walking    Observation/Other Assessments   Observations incisions appear to have some mild redness around the perimeter; no drainage or excessive edema noted, but do note increasd temperature. Will continue to monitor.    Focus on Therapeutic Outcomes (FOTO)  66% limited    AROM   Left Knee Extension 11   Left Knee Flexion 98   Strength   Right Hip Flexion 4/5   Right Hip ABduction 4+/5   Left Hip  Flexion 3/5   Left Hip ABduction 4/5   Right Knee Flexion 4-/5   Right Knee Extension 4/5   Left Knee Flexion 4/5   Left Knee Extension 3/5   Right Ankle Dorsiflexion 5/5   Left Ankle Dorsiflexion 4+/5   Transfers   Five time sit to stand comments  5x sit to stand 18.3    Ambulation/Gait   Gait Comments reduced knee extension and flexion during gait; reduced gait speed; reduced stance L LE/step R LE    6 minute walk test results    Aerobic Endurance Distance Walked 791   Endurance additional comments 6MWT; 1 rest break; 0.68m/s    High Level Balance   High Level Balance Comments TUG 11.89                           PT Education - 06/21/15 1739    Education provided Yes   Education Details prognosis, plan of care, HEP; encouraged to call MD regarding increaed inflammation around incision sites, also to ask if she can use neosporin on sites; proper icing technique; safe use of folded walker to assist on narrow stairs   Person(s) Educated Patient;Spouse   Methods Explanation;Handout   Comprehension Verbalized understanding;Returned demonstration;Need further instruction          PT Short Term Goals - 06/21/15 1750    PT SHORT TERM GOAL #1   Title Patient to demonstrate ROM 0-120 degrees L knee in order to improve overall mechanics and enhance mobiltiy    Time 3   Period Weeks   Status New   PT SHORT TERM GOAL #2   Title Patient to be independent in at least 3 edema/pain management techniques, including but not limited to icing, exercise/stretching, retro-grade massage in roder to increase independence in managing condition    Time 3   Period Weeks   Status New   PT SHORT TERM GOAL #3   Title Paitent to be able to ambulate unlimited distances with no assistive device, improved gait mechanics including equal stance/step lengths/times, improved gait speed, and full L knee ROM in order to enhance mobilty    Time 3   Period Weeks   Status New   PT SHORT TERM  GOAL #4   Title Patient to be indpendent in correctly and consistently performing appropriate HEP, to be updated PRN    Time 3   Period Weeks   Status New           PT Long Term Goals - 06/21/15 1752    PT LONG TERM GOAL #1   Title Patient  to demonstrate strength 5/5 in all tested muscle groups in order to reduce pain, enhance regional stability, and improve gait    Time 6   Period Weeks   Status New   PT LONG TERM GOAL #2   Title Patient to report she has been able to return to yoga with L knee pain no more than 2/10 and with no ROM based limiations that prevent her from particicpating in program in order to assist in returning to PLOF    Time 6   Period Weeks   Status New   PT LONG TERM GOAL #3   Title Patient to be able to ascend/descend full flight of stairs with reciprocal pattern, no railings, minimal unsteadiness, good eccentric control in order to improve overall mobilty at home and in community    Time 6   Period Weeks   Status New   PT LONG TERM GOAL #4   Title Patient to be able to perform TUG in 8 seconds and 5x sit to stand in 12 seconds or less in order to demonstrate improved balance and muscle power for more independence in mobiltyi    Time [place] - 06/23/2015 1742    Clinical Impression Statement Patient arrives status post L knee arthroscopy performed on January 20th, 2017. Patient reports that she has been feeling fairly well today but that she was up on her feet without her walker for quite some time yesterday and that she has been feeling achey today; reports not liking to take her oxycodone for pain but was encouraged to try to take as MD prescribed to assist in pain managemnt with PT and to facilitate increasd progress with skilled PT services. Noted some mild inflammation around incision sites and encouraged patient to call MD in am for advice regarding management of this as patient's husband reports that this  had just started today. Upon examination noted L knee stiffness, localized edema, msucle weakness, gait deviations, and overall reduced funcitonal activity tolerance. Patient does note that she has chronic bronchitis as well as asthma and as such is limited somewhat by respiration. Recommend skilled PT services to address functional limitations and assist in reaching optimal level of function.    Pt will benefit from skilled therapeutic intervention in order to improve on the following deficits Abnormal gait;Hypomobility;Decreased scar mobility;Decreased activity tolerance;Decreased strength;Pain;Decreased balance;Decreased range of motion;Postural dysfunction   Rehab Potential Excellent   Clinical Impairments Affecting Rehab Potential respiratory limitations- chronic bronchitis and asthma    PT Frequency 3x / week   PT Duration 6 weeks   PT Treatment/Interventions ADLs/Self Care Home Management;Cryotherapy;Gait training;DME Instruction;Stair training;Functional mobility training;Therapeutic activities;Therapeutic exercise;Balance training;Neuromuscular re-education;Patient/family education;Manual techniques;Scar mobilization   PT Next Visit Plan review HEP and goals; functional stretching and strengthening, manual PRN, gain training. F/U on inflammation around incision sites.    PT Home Exercise Plan given    Consulted and Agree with Plan of Care Patient          G-Codes - Jun 23, 2015 1755    Functional Assessment Tool Used Per FOTO, 66% limited    Functional Limitation Mobility: Walking and moving around   Mobility: Walking and Moving Around Current Status [id number]) At least 60 percent but less than 80 percent impaired, limited or restricted   Mobility: Walking and Moving Around Goal Status [id number]) At least 40 percent but less than 60 percent impaired,  limited or restricted       Problem List Patient Active Problem List   Diagnosis Date Noted  . Medial meniscus, posterior horn derangement    . Arthritis of knee   . Primary osteoarthritis of left knee 04/05/2014  . Diastolic dysfunction     Deniece Ree PT, DPT [facility] [address] [place], [place], [address] Phone: [phone]   Fax:  [phone]  Name: KYUNG MOEHRING MRN: [id number] Date of Birth: Sep 30, 1940

## 2015-06-21 NOTE — Patient Instructions (Signed)
Quad Set (left leg)    With other leg bent, foot flat, slowly tighten muscles on thigh of straight leg while counting out loud to __2__.  Repeat __10-15__ times. Do __2-3__ sessions per day.  http://gt2.exer.us/276   Copyright  VHI. All rights reserved.   KNEE: Extension, Long Arc Quads - Sitting    Raise leg until knee is straight. __10_ reps per set, _2-3__ sets per day, _5-7__ days per week  Copyright  VHI. All rights reserved.     Lateral Weight Shifts  Shift weight on either feet. You may hold onto a chair for support if you need it.  Repeat for a 2-3 minute period, 2-3 times per day.

## 2015-06-23 ENCOUNTER — Telehealth: Payer: Self-pay | Admitting: Family Medicine

## 2015-06-23 ENCOUNTER — Ambulatory Visit (HOSPITAL_COMMUNITY): Payer: Medicare Other

## 2015-06-23 DIAGNOSIS — R600 Localized edema: Secondary | ICD-10-CM

## 2015-06-23 DIAGNOSIS — M25562 Pain in left knee: Secondary | ICD-10-CM

## 2015-06-23 DIAGNOSIS — M25662 Stiffness of left knee, not elsewhere classified: Secondary | ICD-10-CM | POA: Diagnosis not present

## 2015-06-23 DIAGNOSIS — R262 Difficulty in walking, not elsewhere classified: Secondary | ICD-10-CM | POA: Diagnosis not present

## 2015-06-23 DIAGNOSIS — Z9889 Other specified postprocedural states: Secondary | ICD-10-CM | POA: Diagnosis not present

## 2015-06-23 MED ORDER — METOPROLOL SUCCINATE ER 25 MG PO TB24: 25.0000 mg | ORAL_TABLET | Freq: Every day | ORAL | Status: DC

## 2015-06-23 NOTE — Telephone Encounter (Signed)
Medication called/sent to requested pharmacy  

## 2015-06-23 NOTE — Telephone Encounter (Signed)
Patient needs rx for her metaprolol called into [facility] if possible

## 2015-06-23 NOTE — Therapy (Signed)
[facility] [place], [place], [address] Phone: [phone]   Fax:  [phone]  Physical Therapy Treatment  Patient Details  Name: Laura Carter MRN: [id number] Date of Birth: 1940/12/17 Referring Provider: Dr. Arther Abbott   Encounter Date: 06/23/2015      PT End of Session - 06/23/15 1122    Visit Number 2   Number of Visits 18   Date for PT Re-Evaluation 07/19/15   Authorization Type Medicare/BCBS Supplemental    Authorization Time Period 06/21/15 to 08/19/15   Authorization - Visit Number 2   Authorization - Number of Visits 10   PT Start Time 1106   PT Stop Time [id number]   PT Time Calculation (min) 59 min   Activity Tolerance Patient tolerated treatment well;No increased pain   Behavior During Therapy [facility] for tasks assessed/performed      Past Medical History  Diagnosis Date  . Diastolic dysfunction   . Hypertension   . Asthma   . GERD (gastroesophageal reflux disease)     Past Surgical History  Procedure Laterality Date  . Cholecystectomy  10/10  . Knee arthroscopy with medial menisectomy Left 06/16/2015    Procedure: KNEE ARTHROSCOPY WITH PARTIAL MEDIAL MENISECTOMY;  Surgeon: Carole Civil, MD;  Location: [facility];  Service: Orthopedics;  Laterality: Left;    There were no vitals filed for this visit.  Visit Diagnosis:  S/P left knee arthroscopy  Knee stiffness, left  Left knee pain  Localized edema  Difficulty walking      Subjective Assessment - 06/23/15 1111    Subjective Pt entered dept carrying standard walker, stated she only carrys for safety outdoors.  Pt stated she continues to have a catch on medial aspect of knee.  Reports compliance with HEP daily, continues to have redness around incision.  Current pain scale 2/10 today.  Knee is stiffer in morning.     Pertinent History Patient reports that her knee pain has gone on for awhile; she saw Dr. [name] the fall of 2016, and was told  that she had arthritis and a possible meniscus tear. Knee just kept progressing and she began having constant pain late last year. Had arthoscopy performed on Friday the 20th.    Patient Stated Goals get range and strength, get back to yoga    Currently in Pain? Yes   Pain Score 2    Pain Location Knee   Pain Orientation Left   Pain Descriptors / Indicators Discomfort             [facility] Adult PT Treatment/Exercise - 06/23/15 0001    Exercises   Exercises Knee/Hip   Knee/Hip Exercises: Stretches   Active Hamstring Stretch 3 reps;30 seconds   Active Hamstring Stretch Limitations supine with rope   Quad Stretch 3 reps;30 seconds   Knee/Hip Exercises: Standing   Gait Training Gait training with cueing for heel to toe and equal stance phase, stride length.  Indoor gait training no AD x 226, outdoor gait training with SPC 2 point sequence 226 feet for sequence and 100 ft to car.   Knee/Hip Exercises: Supine   Quad Sets Left;10 reps   Short Arc Quad Sets 15 reps   Heel Slides 10 reps   Terminal Knee Extension 10 reps  5" holds   Straight Leg Raises 10 reps   Knee/Hip Exercises: Sidelying   Hip ABduction Limitations Next session   Knee/Hip Exercises: Prone   Hamstring Curl Limitations --  Hip Extension 10 reps   Manual Therapy   Manual Therapy Edema management;Joint mobilization;Soft tissue mobilization   Manual therapy comments Supine position with LE elevated at end of session, separate from other treatment   Edema Management Retro massage with LE elevated   Joint Mobilization patella mobs all directions   Soft tissue mobilization quad and hamstrings to reduce tightness.                    PT Short Term Goals - 06/21/15 1750    PT SHORT TERM GOAL #1   Title Patient to demonstrate ROM 0-120 degrees L knee in order to improve overall mechanics and enhance mobiltiy    Time 3   Period Weeks   Status New   PT SHORT TERM GOAL #2   Title Patient to be independent in at  least 3 edema/pain management techniques, including but not limited to icing, exercise/stretching, retro-grade massage in roder to increase independence in managing condition    Time 3   Period Weeks   Status New   PT SHORT TERM GOAL #3   Title Paitent to be able to ambulate unlimited distances with no assistive device, improved gait mechanics including equal stance/step lengths/times, improved gait speed, and full L knee ROM in order to enhance mobilty    Time 3   Period Weeks   Status New   PT SHORT TERM GOAL #4   Title Patient to be indpendent in correctly and consistently performing appropriate HEP, to be updated PRN    Time 3   Period Weeks   Status New           PT Long Term Goals - 06/21/15 1752    PT LONG TERM GOAL #1   Title Patient to demonstrate strength 5/5 in all tested muscle groups in order to reduce pain, enhance regional stability, and improve gait    Time 6   Period Weeks   Status New   PT LONG TERM GOAL #2   Title Patient to report she has been able to return to yoga with L knee pain no more than 2/10 and with no ROM based limiations that prevent her from particicpating in program in order to assist in returning to PLOF    Time 6   Period Weeks   Status New   PT LONG TERM GOAL #3   Title Patient to be able to ascend/descend full flight of stairs with reciprocal pattern, no railings, minimal unsteadiness, good eccentric control in order to improve overall mobilty at home and in community    Time 6   Period Weeks   Status New   PT LONG TERM GOAL #4   Title Patient to be able to perform TUG in 8 seconds and 5x sit to stand in 12 seconds or less in order to demonstrate improved balance and muscle power for more independence in mobiltyi    Time 6   Period Weeks   Status New               Plan - 06/23/15 1125    Clinical Impression Statement Reviewed goals, complaince and technique with HEP and copy of evaluation given to pt.   Inital session focus on  improving AROM to improve knee mobilty with ROM based and hip strengthening exercises, functional stretches, gait training with SPC outdoors and no AD indoor gait, and manual techniques to assist with edema control..  Pt able to complete all exercises wtih good form following  cueing for technique.  Noted redness and increased edema around incisions, pt encouraged to call MD.  Pt wtih vast improvements of AROM 6-121 degrees following therex quad strengtheing, functional stretches, and manual retro massage for edema control.  Pt able to demonstrate appropriate gait mechanics with heel to toe, equal stance phase and stride length outdoors with SPC to car with proper sequence.  No reports of pain through session.     PT Next Visit Plan functional stretching and strengthening, manual PRN, gain training. F/U on inflammation around incision sites.   Next session continue quad strengthening for knee extension, progress to CKC for functional strenghteing and balance.  Pt encouraged to bring her cane next session for appropriate height adjustments.          Problem List Patient Active Problem List   Diagnosis Date Noted  . Medial meniscus, posterior horn derangement   . Arthritis of knee   . Primary osteoarthritis of left knee 04/05/2014  . Diastolic dysfunction    Ihor Austin, LPTA; [place]  Aldona Lento 06/23/2015, 12:18 PM  [place], [place], [address] Phone: [phone]   Fax:  [phone]  Name: Laura Carter MRN: [id number] Date of Birth: 1941/03/01

## 2015-06-26 ENCOUNTER — Ambulatory Visit (HOSPITAL_COMMUNITY): Payer: Medicare Other

## 2015-06-26 DIAGNOSIS — R262 Difficulty in walking, not elsewhere classified: Secondary | ICD-10-CM

## 2015-06-26 DIAGNOSIS — M25562 Pain in left knee: Secondary | ICD-10-CM

## 2015-06-26 DIAGNOSIS — M25662 Stiffness of left knee, not elsewhere classified: Secondary | ICD-10-CM

## 2015-06-26 DIAGNOSIS — R600 Localized edema: Secondary | ICD-10-CM | POA: Diagnosis not present

## 2015-06-26 DIAGNOSIS — Z9889 Other specified postprocedural states: Secondary | ICD-10-CM | POA: Diagnosis not present

## 2015-06-26 NOTE — Therapy (Signed)
[place], [place], [address] Phone: [phone]   Fax:  [phone]  Physical Therapy Treatment  Patient Details  Name: Laura Carter MRN: [id number] Date of Birth: Dec 01, 1940 Referring Provider: Dr. Arther Abbott   Encounter Date: 06/26/2015      PT End of Session - 06/26/15 1555    Visit Number 3   Number of Visits 18   Date for PT Re-Evaluation 07/19/15   Authorization Type Medicare/BCBS Supplemental    Authorization Time Period 06/21/15 to 08/19/15   Authorization - Visit Number 3   Authorization - Number of Visits 10   PT Start Time 1520   PT Stop Time [id number]   PT Time Calculation (min) 43 min   Activity Tolerance Patient tolerated treatment well;No increased pain   Behavior During Therapy [facility] for tasks assessed/performed      Past Medical History  Diagnosis Date  . Diastolic dysfunction   . Hypertension   . Asthma   . GERD (gastroesophageal reflux disease)     Past Surgical History  Procedure Laterality Date  . Cholecystectomy  10/10  . Knee arthroscopy with medial menisectomy Left 06/16/2015    Procedure: KNEE ARTHROSCOPY WITH PARTIAL MEDIAL MENISECTOMY;  Surgeon: Carole Civil, MD;  Location: [facility];  Service: Orthopedics;  Laterality: Left;    There were no vitals filed for this visit.  Visit Diagnosis:  S/P left knee arthroscopy  Knee stiffness, left  Left knee pain  Localized edema  Difficulty walking      Subjective Assessment - 06/26/15 1530    Subjective Pt entered dept ambulating/carrying SPC from home.  No reports of pain, pt stated the quad sets are feeling good.  Lt knee continues to redness and a little irritation around incision, pt reports knee is itchy today.     Pertinent History Patient reports that her knee pain has gone on for awhile; she saw Dr. [name] the fall of 2016, and was told that she had arthritis and a possible meniscus tear. Knee just kept  progressing and she began having constant pain late last year. Had arthoscopy performed on Friday the 20th.    Patient Stated Goals get range and strength, get back to yoga    Currently in Pain? No/denies             [facility] Adult PT Treatment/Exercise - 06/26/15 0001    Knee/Hip Exercises: Stretches   Active Hamstring Stretch 3 reps;30 seconds   Active Hamstring Stretch Limitations supine with rope   Quad Stretch 3 reps;30 seconds   Knee/Hip Exercises: Standing   Functional Squat 10 reps   Functional Squat Limitations 3D hip excursion   Rocker Board 2 minutes   Rocker Board Limitations R/L and A/P   Gait Training Gait training with cueing for heel to toe and equal stance phase, stride length.  Indoor gait training no AD x 226,   Knee/Hip Exercises: Supine   Quad Sets Left;10 reps   Heel Slides 10 reps  2-122   Terminal Knee Extension 10 reps   Knee/Hip Exercises: Sidelying   Hip ABduction Limitations 10   Knee/Hip Exercises: Prone   Hip Extension 10 reps   Manual Therapy   Manual Therapy Joint mobilization   Joint Mobilization patella mobs all directions             PT Short Term Goals - 06/21/15 1750    PT SHORT TERM GOAL #1   Title Patient to  demonstrate ROM 0-120 degrees L knee in order to improve overall mechanics and enhance mobiltiy    Time 3   Period Weeks   Status New   PT SHORT TERM GOAL #2   Title Patient to be independent in at least 3 edema/pain management techniques, including but not limited to icing, exercise/stretching, retro-grade massage in roder to increase independence in managing condition    Time 3   Period Weeks   Status New   PT SHORT TERM GOAL #3   Title Paitent to be able to ambulate unlimited distances with no assistive device, improved gait mechanics including equal stance/step lengths/times, improved gait speed, and full L knee ROM in order to enhance mobilty    Time 3   Period Weeks   Status New   PT SHORT TERM GOAL #4   Title  Patient to be indpendent in correctly and consistently performing appropriate HEP, to be updated PRN    Time 3   Period Weeks   Status New           PT Long Term Goals - 06/21/15 1752    PT LONG TERM GOAL #1   Title Patient to demonstrate strength 5/5 in all tested muscle groups in order to reduce pain, enhance regional stability, and improve gait    Time 6   Period Weeks   Status New   PT LONG TERM GOAL #2   Title Patient to report she has been able to return to yoga with L knee pain no more than 2/10 and with no ROM based limiations that prevent her from particicpating in program in order to assist in returning to PLOF    Time 6   Period Weeks   Status New   PT LONG TERM GOAL #3   Title Patient to be able to ascend/descend full flight of stairs with reciprocal pattern, no railings, minimal unsteadiness, good eccentric control in order to improve overall mobilty at home and in community    Time 6   Period Weeks   Status New   PT LONG TERM GOAL #4   Title Patient to be able to perform TUG in 8 seconds and 5x sit to stand in 12 seconds or less in order to demonstrate improved balance and muscle power for more independence in mobiltyi    Time 6   Period Weeks   Status New               Plan - 06/26/15 1612    Clinical Impression Statement Pt progressing well towards PT POC.  Pt brought SPC with her to session, unable to adjust SPC height though able to demonstrate appropriate 2 point sequence with no cueing required.  Gait training complete this session with no AD with min cueing for heel to toe patter and equal stance phase.  Added rocker board  to improve weight distribution with gait.  Pt c/o "catching" during full extension with gait.  Patalla mobs complete to improve patella traction, noted restricted mobiltiy all directions.  Pt instructed how to complete this at home.  Progressed proximal musculature with sidelying abduction with cueing for proper form.  Continued ROM  based activities with great gains, AROM 2-122 degrees this session.  Minimal edema noted this session.  Knee incision contines to be red with irritation to skin, decreased redness.  Pt reported she tried to call MD but unable to reach, will continue to monitor incision   PT Next Visit Plan Conitinue PT POC  with  functional stretches and strengthening, manual PRN, gait training no AD.  F/u on inflammation and redness around incision sites.  Next session begin lateral and forward step up, SLS, progress CKC for functional strengthening and balance.          Problem List Patient Active Problem List   Diagnosis Date Noted  . Medial meniscus, posterior horn derangement   . Arthritis of knee   . Primary osteoarthritis of left knee 04/05/2014  . Diastolic dysfunction    Ihor Austin, LPTA; [facility] [phone]  Aldona Lento 06/26/2015, 4:47 PM  [place], [place], [address] Phone: [phone]   Fax:  [phone]  Name: Laura Carter MRN: [id number] Date of Birth: 07/23/1940

## 2015-06-27 ENCOUNTER — Telehealth: Payer: Self-pay | Admitting: Orthopedic Surgery

## 2015-06-27 NOTE — Telephone Encounter (Signed)
Per patient calling in today, 06/27/15, during our relocation, and patient relaying some slight redness noted around incision, status/post arthroscopic knee surgery of 06/16/15 --  appointment for wound check has been scheduled for tomorrow afternoon, when Dr [name] returns to clinic.  Patient's question is whether to keep her morning physical therapy appointment tomorrow, or wait until the appointment with Dr [name]?  [phone]

## 2015-06-28 ENCOUNTER — Encounter: Payer: Self-pay | Admitting: Orthopedic Surgery

## 2015-06-28 ENCOUNTER — Ambulatory Visit (INDEPENDENT_AMBULATORY_CARE_PROVIDER_SITE_OTHER): Payer: Self-pay | Admitting: Orthopedic Surgery

## 2015-06-28 ENCOUNTER — Ambulatory Visit (HOSPITAL_COMMUNITY): Payer: Medicare Other | Attending: Orthopedic Surgery

## 2015-06-28 VITALS — BP 120/71 | HR 82 | Temp 96.8°F | Ht 65.0 in | Wt 174.0 lb

## 2015-06-28 DIAGNOSIS — R262 Difficulty in walking, not elsewhere classified: Secondary | ICD-10-CM | POA: Diagnosis not present

## 2015-06-28 DIAGNOSIS — R600 Localized edema: Secondary | ICD-10-CM | POA: Diagnosis not present

## 2015-06-28 DIAGNOSIS — M25562 Pain in left knee: Secondary | ICD-10-CM | POA: Insufficient documentation

## 2015-06-28 DIAGNOSIS — Z9889 Other specified postprocedural states: Secondary | ICD-10-CM

## 2015-06-28 DIAGNOSIS — M25662 Stiffness of left knee, not elsewhere classified: Secondary | ICD-10-CM | POA: Diagnosis not present

## 2015-06-28 NOTE — Telephone Encounter (Signed)
Patient seen in office today. 

## 2015-06-28 NOTE — Patient Instructions (Signed)
Keep regular appointment

## 2015-06-28 NOTE — Progress Notes (Signed)
Patient ID: Laura Carter, female   DOB: January 01, 1941, 75 y.o.   MRN: [id number]  Follow up visit  Chief Complaint  Patient presents with  . Wound Check    left knee    BP 120/71 mmHg  Pulse 82  Temp(Src) 96.8 F (36 C)  Ht 5\' 5"  (1.651 m)  Wt 174 lb (78.926 kg)  BMI 28.96 kg/m2  Concern over the wound   No issues that I see keep appt as scheduled 2.21

## 2015-06-28 NOTE — Therapy (Signed)
[place], [place], [address] Phone: [phone]   Fax:  [phone]  Physical Therapy Treatment  Patient Details  Name: Laura Carter MRN: [id number] Date of Birth: 25-Dec-1940 Referring Provider: Dr. Arther Abbott   Encounter Date: 06/28/2015      PT End of Session - 06/28/15 0947    Visit Number 4   Number of Visits 18   Date for PT Re-Evaluation 07/19/15   Authorization Type Medicare/BCBS Supplemental    Authorization Time Period 06/21/15 to 08/19/15   Authorization - Visit Number 4   Authorization - Number of Visits 10   PT Start Time [id number]   PT Stop Time [id number]   PT Time Calculation (min) 44 min   Activity Tolerance Patient tolerated treatment well;No increased pain   Behavior During Therapy [facility] for tasks assessed/performed      Past Medical History  Diagnosis Date  . Diastolic dysfunction   . Hypertension   . Asthma   . GERD (gastroesophageal reflux disease)     Past Surgical History  Procedure Laterality Date  . Cholecystectomy  10/10  . Knee arthroscopy with medial menisectomy Left 06/16/2015    Procedure: KNEE ARTHROSCOPY WITH PARTIAL MEDIAL MENISECTOMY;  Surgeon: Carole Civil, MD;  Location: [facility];  Service: Orthopedics;  Laterality: Left;    There were no vitals filed for this visit.  Visit Diagnosis:  Knee stiffness, left  Left knee pain  Localized edema  Difficulty walking      Subjective Assessment - 06/28/15 0935    Subjective Pt reported that her L knee "is feeling better" and that she is more aware of her gait pattern. Pt reported good complaince with her HEP with stiffness noted in the morning with her initial session with her HEP. Pt is to see Dr. [name] later this afternoon to have her surgical incisions assessed. No new complaints reported per pt.     Pertinent History Patient reports that her knee pain has gone on for awhile; she saw Dr. [name] the fall of 2016,  and was told that she had arthritis and a possible meniscus tear. Knee just kept progressing and she began having constant pain late last year. Had arthoscopy performed on Friday the 20th.    How long can you stand comfortably? 30 minutes    How long can you walk comfortably? with walker, no limits; without walker household distances    Patient Stated Goals get range and strength, get back to yoga    Currently in Pain? No/denies   Pain Score 0-No pain  L knee pain ranges between a 0-4/10 on a VAS.    Pain Location Knee   Pain Orientation Left   Pain Descriptors / Indicators Discomfort   Pain Type Surgical pain   Pain Onset 1 to 4 weeks ago   Aggravating Factors  lifting L LE and ambulation    Pain Relieving Factors movement    Multiple Pain Sites No                         [facility] Adult PT Treatment/Exercise - 06/28/15 0001    Exercises   Exercises Knee/Hip   Knee/Hip Exercises: Stretches   Active Hamstring Stretch 3 reps;30 seconds   Active Hamstring Stretch Limitations supine with rope   Quad Stretch 3 reps;30 seconds   Knee/Hip Exercises: Standing   Forward Step Up Left;1 set;10 reps;Step Height: 4";Other (comment)  with B  UE support on handrails    Functional Squat 15 reps;Other (comment)  with physioball on wall   Rocker Board 2 minutes   Rocker Board Limitations A<>P and M<>L x 2 sets, each fot 1 minute    Knee/Hip Exercises: Supine   Quad Sets Left;15 reps   Heel Slides Left;15 reps   Terminal Knee Extension --   Straight Leg Raises Left;2 sets;10 reps   Knee/Hip Exercises: Sidelying   Hip ABduction Left;2 sets;10 reps   Knee/Hip Exercises: Prone   Hip Extension Left;2 sets;10 reps   Manual Therapy   Manual Therapy Joint mobilization   Joint Mobilization L patella mobs using grade I-III in supine    Soft tissue mobilization L quad and hamstrings to reduce tightness in hooklying position   with use of rolling stick and palmar spreading technique                  PT Education - 06/28/15 0945    Education provided Yes   Education Details Educated pt on current HEP, pain management strategies, and proper gait pattern with use of SPC    Person(s) Educated Patient   Methods Explanation;Demonstration   Comprehension Verbalized understanding;Returned demonstration          PT Short Term Goals - 06/21/15 1750    PT SHORT TERM GOAL #1   Title Patient to demonstrate ROM 0-120 degrees L knee in order to improve overall mechanics and enhance mobiltiy    Time 3   Period Weeks   Status New   PT SHORT TERM GOAL #2   Title Patient to be independent in at least 3 edema/pain management techniques, including but not limited to icing, exercise/stretching, retro-grade massage in roder to increase independence in managing condition    Time 3   Period Weeks   Status New   PT SHORT TERM GOAL #3   Title Paitent to be able to ambulate unlimited distances with no assistive device, improved gait mechanics including equal stance/step lengths/times, improved gait speed, and full L knee ROM in order to enhance mobilty    Time 3   Period Weeks   Status New   PT SHORT TERM GOAL #4   Title Patient to be indpendent in correctly and consistently performing appropriate HEP, to be updated PRN    Time 3   Period Weeks   Status New           PT Long Term Goals - 06/21/15 1752    PT LONG TERM GOAL #1   Title Patient to demonstrate strength 5/5 in all tested muscle groups in order to reduce pain, enhance regional stability, and improve gait    Time 6   Period Weeks   Status New   PT LONG TERM GOAL #2   Title Patient to report she has been able to return to yoga with L knee pain no more than 2/10 and with no ROM based limiations that prevent her from particicpating in program in order to assist in returning to PLOF    Time 6   Period Weeks   Status New   PT LONG TERM GOAL #3   Title Patient to be able to ascend/descend full flight of stairs  with reciprocal pattern, no railings, minimal unsteadiness, good eccentric control in order to improve overall mobilty at home and in community    Time 6   Period Weeks   Status New   PT LONG TERM GOAL #4   Title Patient to  be able to perform TUG in 8 seconds and 5x sit to stand in 12 seconds or less in order to demonstrate improved balance and muscle power for more independence in mobiltyi    Time 6   Period Weeks   Status New               Plan - 06/28/15 [id number]    Clinical Impression Statement Pt tolerated added reps/sets and OKC ther ex without exacerbating symptoms. Minor to moderate restrictions palpated with L patellar mobility in S<>I and M<>L direction, which improved post patellar mobs. Added front step-ups on 4" step with focus on control and proper knee alignment. Minor cues required for proper form. L knee AAROM measured 5-118 degrees. Pt is making steady progress towards stated PT goals with less severe L knee pain and improved L knee AROM/AAROM. She continues to present with strength, ROM, and jt mobility deficits and would benefit from continued skilled PT. Slowly progress with basic CKC ther ex to improve functional strengthening of the L quad/HS.    Pt will benefit from skilled therapeutic intervention in order to improve on the following deficits Abnormal gait;Hypomobility;Decreased scar mobility;Decreased activity tolerance;Decreased strength;Pain;Decreased balance;Decreased range of motion;Postural dysfunction   Rehab Potential Excellent   Clinical Impairments Affecting Rehab Potential respiratory limitations- chronic bronchitis and asthma    PT Frequency 3x / week   PT Duration 6 weeks   PT Treatment/Interventions ADLs/Self Care Home Management;Cryotherapy;Gait training;DME Instruction;Stair training;Functional mobility training;Therapeutic activities;Therapeutic exercise;Balance training;Neuromuscular re-education;Patient/family education;Manual techniques;Scar  mobilization   PT Next Visit Plan Conitinue PT POC  with functional stretches and strengthening, manual PRN, gait training no AD.  Review and assess tolerance with CKC ther ex completed.    PT Home Exercise Plan Reviewed HEP with no changes made this visit   Consulted and Agree with Plan of Care Patient        Problem List Patient Active Problem List   Diagnosis Date Noted  . Medial meniscus, posterior horn derangement   . Arthritis of knee   . Primary osteoarthritis of left knee 04/05/2014  . Diastolic dysfunction     Garen Lah, PT, DPT     06/28/2015, 12:55 PM  [place], [place], [address] Phone: [phone]   Fax:  [phone]  Name: Laura Carter MRN: [id number] Date of Birth: Dec 20, 1940

## 2015-06-30 ENCOUNTER — Ambulatory Visit (HOSPITAL_COMMUNITY): Payer: Medicare Other

## 2015-06-30 DIAGNOSIS — M25662 Stiffness of left knee, not elsewhere classified: Secondary | ICD-10-CM

## 2015-06-30 DIAGNOSIS — R262 Difficulty in walking, not elsewhere classified: Secondary | ICD-10-CM

## 2015-06-30 DIAGNOSIS — M25562 Pain in left knee: Secondary | ICD-10-CM

## 2015-06-30 DIAGNOSIS — R600 Localized edema: Secondary | ICD-10-CM

## 2015-06-30 DIAGNOSIS — Z9889 Other specified postprocedural states: Secondary | ICD-10-CM

## 2015-06-30 NOTE — Therapy (Signed)
[place], [place], [address] Phone: [phone]   Fax:  [phone]  Physical Therapy Treatment  Patient Details  Name: Laura Carter MRN: [id number] Date of Birth: 1940-10-21 Referring Provider: Dr. Arther Abbott   Encounter Date: 06/30/2015      PT End of Session - 06/30/15 1044    Visit Number 5   Number of Visits 18   Date for PT Re-Evaluation 07/19/15   Authorization Type Medicare/BCBS Supplemental    Authorization Time Period 06/21/15 to 08/19/15   Authorization - Visit Number 5   Authorization - Number of Visits 10   PT Start Time 1020   PT Stop Time 1100   PT Time Calculation (min) 40 min   Activity Tolerance Patient tolerated treatment well   Behavior During Therapy [facility] for tasks assessed/performed      Past Medical History  Diagnosis Date  . Diastolic dysfunction   . Hypertension   . Asthma   . GERD (gastroesophageal reflux disease)     Past Surgical History  Procedure Laterality Date  . Cholecystectomy  10/10  . Knee arthroscopy with medial menisectomy Left 06/16/2015    Procedure: KNEE ARTHROSCOPY WITH PARTIAL MEDIAL MENISECTOMY;  Surgeon: Carole Civil, MD;  Location: [facility];  Service: Orthopedics;  Laterality: Left;    There were no vitals filed for this visit.  Visit Diagnosis:  Knee stiffness, left  Left knee pain  Localized edema  Difficulty walking  S/P left knee arthroscopy      Subjective Assessment - 06/30/15 1026    Subjective Pt noted L HS soreness and tightness upon arrival, which she attributed to walking on unlevel terrain yesterday. In addition, pt noted less severe L knee pain since last PT visit, which was rated a 3/10 on a VAS.        Limitations Walking   How long can you stand comfortably? 30 minutes    How long can you walk comfortably? with walker, no limits; without walker household distances    Patient Stated Goals get range and strength, get back  to yoga    Currently in Pain? Yes   Pain Score 3    Pain Location Knee   Pain Orientation Left   Pain Descriptors / Indicators Discomfort   Pain Type Surgical pain   Pain Onset 1 to 4 weeks ago   Pain Frequency Intermittent   Aggravating Factors  ambulation on unlevel terrain    Pain Relieving Factors changing position and heel slides    Multiple Pain Sites No                         [facility] Adult PT Treatment/Exercise - 06/30/15 0001    Exercises   Exercises Knee/Hip   Knee/Hip Exercises: Stretches   Active Hamstring Stretch 3 reps;30 seconds;Left   Active Hamstring Stretch Limitations supine with rope   Quad Stretch 3 reps;30 seconds;Left   Knee/Hip Exercises: Standing   Forward Step Up Left;1 set;10 reps;Step Height: 4";Other (comment)   Functional Squat 15 reps;Other (comment);2 sets   Rocker Board 2 minutes   Rocker Board Limitations A<>P and M<>L x 2 sets, each fot 1 minute    Knee/Hip Exercises: Supine   Quad Sets Left;15 reps   Heel Slides Left;15 reps   Terminal Knee Extension AROM;15 reps  with use of ZERO KNEE FOAM   Straight Leg Raises Left;2 sets;10 reps   Knee Extension AAROM;Left  Knee Extension Limitations 2 deg   Knee Flexion AAROM;Left   Knee Flexion Limitations 127 deg   Knee/Hip Exercises: Sidelying   Hip ABduction Left;2 sets;10 reps   Knee/Hip Exercises: Prone   Hip Extension Left;2 sets;10 reps   Manual Therapy   Manual Therapy Joint mobilization   Manual therapy comments Supine position with LE elevated at end of session, separate from other treatment   Edema Management Retro massage with LE elevated   Joint Mobilization L patella mobs using grade I-III in supine    Soft tissue mobilization L quad and hamstrings to reduce tightness in hooklying position                 PT Education - 06/30/15 1043    Education provided Yes   Education Details Educated pt on current HEP, stretching parameters, and proper heel to toe  sequence    Person(s) Educated Patient   Methods Explanation;Demonstration   Comprehension Verbalized understanding;Returned demonstration;Need further instruction          PT Short Term Goals - 06/21/15 1750    PT SHORT TERM GOAL #1   Title Patient to demonstrate ROM 0-120 degrees L knee in order to improve overall mechanics and enhance mobiltiy    Time 3   Period Weeks   Status New   PT SHORT TERM GOAL #2   Title Patient to be independent in at least 3 edema/pain management techniques, including but not limited to icing, exercise/stretching, retro-grade massage in roder to increase independence in managing condition    Time 3   Period Weeks   Status New   PT SHORT TERM GOAL #3   Title Paitent to be able to ambulate unlimited distances with no assistive device, improved gait mechanics including equal stance/step lengths/times, improved gait speed, and full L knee ROM in order to enhance mobilty    Time 3   Period Weeks   Status New   PT SHORT TERM GOAL #4   Title Patient to be indpendent in correctly and consistently performing appropriate HEP, to be updated PRN    Time 3   Period Weeks   Status New           PT Long Term Goals - 06/21/15 1752    PT LONG TERM GOAL #1   Title Patient to demonstrate strength 5/5 in all tested muscle groups in order to reduce pain, enhance regional stability, and improve gait    Time 6   Period Weeks   Status New   PT LONG TERM GOAL #2   Title Patient to report she has been able to return to yoga with L knee pain no more than 2/10 and with no ROM based limiations that prevent her from particicpating in program in order to assist in returning to PLOF    Time 6   Period Weeks   Status New   PT LONG TERM GOAL #3   Title Patient to be able to ascend/descend full flight of stairs with reciprocal pattern, no railings, minimal unsteadiness, good eccentric control in order to improve overall mobilty at home and in community    Time 6   Period  Weeks   Status New   PT LONG TERM GOAL #4   Title Patient to be able to perform TUG in 8 seconds and 5x sit to stand in 12 seconds or less in order to demonstrate improved balance and muscle power for more independence in mobiltyi    Time 6  Period Weeks   Status New               Plan - 06/30/15 1045    Clinical Impression Statement Good tolerance reported with today's PT tx. Initiated PT tx with STM to the L HS secondary to c/o muscle soreness and tightness. Palpable tenderness assessed at distal biceps femoris muscle and distal insertion of the ITB. Less palpable tenderness reported post manual therapy techniques. Reviewed and completed OKC knee and hip strengthening ther ex with no changes made this visit. Pt c/o glute/HS fatigue with hip SLR into extension. L knee AAROM measured 2-127 degrees after ROM/and manual therapy techniques completed. Pt is making excellent progress towards stated goals with improved L knee ROM and less severe L knee pain. Continue with current POC.      Pt will benefit from skilled therapeutic intervention in order to improve on the following deficits Abnormal gait;Hypomobility;Decreased scar mobility;Decreased activity tolerance;Decreased strength;Pain;Decreased balance;Decreased range of motion;Postural dysfunction   Rehab Potential Excellent   Clinical Impairments Affecting Rehab Potential respiratory limitations- chronic bronchitis and asthma    PT Frequency 3x / week   PT Duration 6 weeks   PT Treatment/Interventions ADLs/Self Care Home Management;Cryotherapy;Gait training;DME Instruction;Stair training;Functional mobility training;Therapeutic activities;Therapeutic exercise;Balance training;Neuromuscular re-education;Patient/family education;Manual techniques;Scar mobilization   PT Next Visit Plan Conitinue PT POC with focus on functional stretches and strengthening, and manuals PRN.  Review and assess tolerance with CKC ther ex completed.    PT Home  Exercise Plan Reviewed HEP with no changes made this visit   Consulted and Agree with Plan of Care Patient        Problem List Patient Active Problem List   Diagnosis Date Noted  . Medial meniscus, posterior horn derangement   . Arthritis of knee   . Primary osteoarthritis of left knee 04/05/2014  . Diastolic dysfunction     Garen Lah, PT, DPT     06/30/2015, 11:58 AM  [place], [place], [address] Phone: [phone]   Fax:  [phone]  Name: CALISSE PICARDI MRN: [id number] Date of Birth: 1941-05-14

## 2015-07-03 ENCOUNTER — Ambulatory Visit (HOSPITAL_COMMUNITY): Payer: Medicare Other | Admitting: Physical Therapy

## 2015-07-03 DIAGNOSIS — R262 Difficulty in walking, not elsewhere classified: Secondary | ICD-10-CM

## 2015-07-03 DIAGNOSIS — M25662 Stiffness of left knee, not elsewhere classified: Secondary | ICD-10-CM

## 2015-07-03 DIAGNOSIS — Z9889 Other specified postprocedural states: Secondary | ICD-10-CM | POA: Diagnosis not present

## 2015-07-03 DIAGNOSIS — M25562 Pain in left knee: Secondary | ICD-10-CM | POA: Diagnosis not present

## 2015-07-03 DIAGNOSIS — R600 Localized edema: Secondary | ICD-10-CM

## 2015-07-03 NOTE — Therapy (Signed)
[place], [place], [address] Phone: [phone]   Fax:  [phone]  Physical Therapy Treatment  Patient Details  Name: Laura Carter MRN: [id number] Date of Birth: September 06, 1940 Referring Provider: Dr. Arther Abbott   Encounter Date: 07/03/2015      PT End of Session - 07/03/15 1135    Visit Number 6   Number of Visits 18   Date for PT Re-Evaluation 07/19/15   Authorization Type Medicare/BCBS Supplemental    Authorization Time Period 06/21/15 to 08/19/15   Authorization - Visit Number 6   Authorization - Number of Visits 10   PT Start Time 1022   PT Stop Time 1115   PT Time Calculation (min) 53 min   Activity Tolerance Patient tolerated treatment well   Behavior During Therapy [facility] for tasks assessed/performed      Past Medical History  Diagnosis Date  . Diastolic dysfunction   . Hypertension   . Asthma   . GERD (gastroesophageal reflux disease)     Past Surgical History  Procedure Laterality Date  . Cholecystectomy  10/10  . Knee arthroscopy with medial menisectomy Left 06/16/2015    Procedure: KNEE ARTHROSCOPY WITH PARTIAL MEDIAL MENISECTOMY;  Surgeon: Carole Civil, MD;  Location: [facility];  Service: Orthopedics;  Laterality: Left;    There were no vitals filed for this visit.  Visit Diagnosis:  Knee stiffness, left  Left knee pain  Localized edema  Difficulty walking  S/P left knee arthroscopy      Subjective Assessment - 07/03/15 1139    Subjective Pt states she had the best night last night and was able to sleep without waking with pain or discomfort.  States she's still having "charlie horse" behind her Lt hamstring.  no pain currently.   Currently in Pain? No/denies                         [facility] Adult PT Treatment/Exercise - 07/03/15 1132    Knee/Hip Exercises: Stretches   Active Hamstring Stretch 3 reps;30 seconds;Left   Active Hamstring Stretch Limitations  standing on 12" box   Gastroc Stretch Both;3 reps;30 seconds   Gastroc Stretch Limitations slant board   Knee/Hip Exercises: Standing   Lateral Step Up Left;10 reps;Hand Hold: 2;Step Height: 4"   Lateral Step Up Limitations 4" box   Forward Step Up Left;1 set;10 reps;Step Height: 4";Other (comment)   Forward Step Up Limitations 4" box   Functional Squat 15 reps;Other (comment);2 sets   Knee/Hip Exercises: Supine   Straight Leg Raises Both;10 reps;2 sets   Knee Extension AAROM;Left   Knee Extension Limitations 2 deg   Knee Flexion AAROM;Left   Knee Flexion Limitations 130 deg   Knee/Hip Exercises: Sidelying   Hip ABduction Left;2 sets;10 reps   Knee/Hip Exercises: Prone   Hamstring Curl 10 reps   Hip Extension Left;2 sets;10 reps   Manual Therapy   Manual Therapy Soft tissue mobilization   Manual therapy comments Supine position with LE elevated at end of session, separate from other treatment   Edema Management Retro massage with LE elevated   Soft tissue mobilization knee and surrounding musculature to reduce tightness, hamstring area as well                  PT Short Term Goals - 06/21/15 1750    PT SHORT TERM GOAL #1   Title Patient to demonstrate ROM 0-120 degrees L  knee in order to improve overall mechanics and enhance mobiltiy    Time 3   Period Weeks   Status New   PT SHORT TERM GOAL #2   Title Patient to be independent in at least 3 edema/pain management techniques, including but not limited to icing, exercise/stretching, retro-grade massage in roder to increase independence in managing condition    Time 3   Period Weeks   Status New   PT SHORT TERM GOAL #3   Title Paitent to be able to ambulate unlimited distances with no assistive device, improved gait mechanics including equal stance/step lengths/times, improved gait speed, and full L knee ROM in order to enhance mobilty    Time 3   Period Weeks   Status New   PT SHORT TERM GOAL #4   Title Patient to  be indpendent in correctly and consistently performing appropriate HEP, to be updated PRN    Time 3   Period Weeks   Status New           PT Long Term Goals - 06/21/15 1752    PT LONG TERM GOAL #1   Title Patient to demonstrate strength 5/5 in all tested muscle groups in order to reduce pain, enhance regional stability, and improve gait    Time 6   Period Weeks   Status New   PT LONG TERM GOAL #2   Title Patient to report she has been able to return to yoga with L knee pain no more than 2/10 and with no ROM based limiations that prevent her from particicpating in program in order to assist in returning to PLOF    Time 6   Period Weeks   Status New   PT LONG TERM GOAL #3   Title Patient to be able to ascend/descend full flight of stairs with reciprocal pattern, no railings, minimal unsteadiness, good eccentric control in order to improve overall mobilty at home and in community    Time 6   Period Weeks   Status New   PT LONG TERM GOAL #4   Title Patient to be able to perform TUG in 8 seconds and 5x sit to stand in 12 seconds or less in order to demonstrate improved balance and muscle power for more independence in mobiltyi    Time 6   Period Weeks   Status New               Plan - 07/03/15 1136    Clinical Impression Statement continued focus on improving functional strength, decreasing pain and edema.   Pt making progress towards goals with overall AROM of 2-130 degrees in supine.  Continued cramps in posterior hamstring patient describes as "charlie horse".  Recommended patient complete self massage or seek profressional massage services.  Pt verbalized need and understanding.  Noted weakness in Lt quad with extension lag completing SLR.  Added lateral step ups and gastroc stretch today.    Pt will benefit from skilled therapeutic intervention in order to improve on the following deficits Abnormal gait;Hypomobility;Decreased scar mobility;Decreased activity  tolerance;Decreased strength;Pain;Decreased balance;Decreased range of motion;Postural dysfunction   Rehab Potential Excellent   PT Frequency 3x / week   PT Duration 6 weeks   PT Treatment/Interventions ADLs/Self Care Home Management;Cryotherapy;Gait training;DME Instruction;Stair training;Functional mobility training;Therapeutic activities;Therapeutic exercise;Balance training;Neuromuscular re-education;Patient/family education;Manual techniques;Scar mobilization   PT Next Visit Plan Conitinue PT POC with focus on functional stretches and strengthening, and manuals PRN.  Review and assess tolerance with CKC ther ex completed.  Consulted and Agree with Plan of Care Patient        Problem List Patient Active Problem List   Diagnosis Date Noted  . Medial meniscus, posterior horn derangement   . Arthritis of knee   . Primary osteoarthritis of left knee 04/05/2014  . Diastolic dysfunction     Teena Irani, PTA/CLT [phone]  07/03/2015, 11:40 AM  [place], [place], [address] Phone: [phone]   Fax:  [phone]  Name: Laura Carter MRN: [id number] Date of Birth: 10-08-1940

## 2015-07-05 ENCOUNTER — Ambulatory Visit (HOSPITAL_COMMUNITY): Payer: Medicare Other

## 2015-07-05 DIAGNOSIS — R600 Localized edema: Secondary | ICD-10-CM

## 2015-07-05 DIAGNOSIS — M25562 Pain in left knee: Secondary | ICD-10-CM

## 2015-07-05 DIAGNOSIS — R262 Difficulty in walking, not elsewhere classified: Secondary | ICD-10-CM | POA: Diagnosis not present

## 2015-07-05 DIAGNOSIS — Z9889 Other specified postprocedural states: Secondary | ICD-10-CM | POA: Diagnosis not present

## 2015-07-05 DIAGNOSIS — M25662 Stiffness of left knee, not elsewhere classified: Secondary | ICD-10-CM | POA: Diagnosis not present

## 2015-07-05 NOTE — Therapy (Signed)
[place], [place], [address] Phone: [phone]   Fax:  [phone]  Physical Therapy Treatment  Patient Details  Name: Laura Carter MRN: [id number] Date of Birth: [birth year] Referring Provider: Dr. Arther Abbott   Encounter Date: 07/05/2015      PT End of Session - 07/05/15 1022    Visit Number 7   Number of Visits 18   Date for PT Re-Evaluation 07/19/15   Authorization Type Medicare/BCBS Supplemental    Authorization Time Period 06/21/15 to 08/19/15   Authorization - Visit Number 7   Authorization - Number of Visits 10   PT Start Time [id number]   PT Stop Time 1105   PT Time Calculation (min) 47 min   Activity Tolerance Patient tolerated treatment well   Behavior During Therapy [facility] for tasks assessed/performed      Past Medical History  Diagnosis Date  . Diastolic dysfunction   . Hypertension   . Asthma   . GERD (gastroesophageal reflux disease)     Past Surgical History  Procedure Laterality Date  . Cholecystectomy  10/10  . Knee arthroscopy with medial menisectomy Left 06/16/2015    Procedure: KNEE ARTHROSCOPY WITH PARTIAL MEDIAL MENISECTOMY;  Surgeon: Carole Civil, MD;  Location: [facility];  Service: Orthopedics;  Laterality: Left;    There were no vitals filed for this visit.  Visit Diagnosis:  Knee stiffness, left  Left knee pain  Localized edema  Difficulty walking  S/P left knee arthroscopy      Subjective Assessment - 07/05/15 1021    Subjective Pt stated she had good night and had a good session last session.  No reports of pain, no reports of cramping today.   Pertinent History Patient reports that her knee pain has gone on for awhile; she saw Dr. [name] the fall of 2016, and was told that she had arthritis and a possible meniscus tear. Knee just kept progressing and she began having constant pain late last year. Had arthoscopy performed on Friday the 20th.    Patient Stated  Goals get range and strength, get back to yoga    Currently in Pain? No/denies                         [facility] Adult PT Treatment/Exercise - 07/05/15 0001    Knee/Hip Exercises: Stretches   Active Hamstring Stretch 3 reps;30 seconds;Left   Active Hamstring Stretch Limitations supine with rope   Quad Stretch 3 reps;30 seconds;Left   Knee/Hip Exercises: Standing   Lateral Step Up Left;10 reps;Hand Hold: 2;Step Height: 4"   Lateral Step Up Limitations 4" box   Forward Step Up Left;1 set;10 reps;Step Height: 4";Other (comment)   Forward Step Up Limitations 4" box   Functional Squat 15 reps   Knee/Hip Exercises: Supine   Terminal Knee Extension 15 reps  5" hold with therapist facilitaiton for proper technique   Knee Extension AAROM;Left   Knee Extension Limitations 2 deg   Knee Flexion AAROM;Left   Knee Flexion Limitations 130 deg   Knee/Hip Exercises: Sidelying   Hip ABduction 15 reps   Knee/Hip Exercises: Prone   Hamstring Curl 15 reps   Hip Extension 15 reps   Manual Therapy   Manual Therapy Soft tissue mobilization   Manual therapy comments Supine position with LE elevated at end of session, separate from other treatment   Edema Management Retro massage with LE elevated   Joint Mobilization  L patella mobs using grade I-III in supine    Soft tissue mobilization knee and surrounding musculature to reduce tightness, hamstring area as well                  PT Short Term Goals - 06/21/15 1750    PT SHORT TERM GOAL #1   Title Patient to demonstrate ROM 0-120 degrees L knee in order to improve overall mechanics and enhance mobiltiy    Time 3   Period Weeks   Status New   PT SHORT TERM GOAL #2   Title Patient to be independent in at least 3 edema/pain management techniques, including but not limited to icing, exercise/stretching, retro-grade massage in roder to increase independence in managing condition    Time 3   Period Weeks   Status New   PT SHORT  TERM GOAL #3   Title Paitent to be able to ambulate unlimited distances with no assistive device, improved gait mechanics including equal stance/step lengths/times, improved gait speed, and full L knee ROM in order to enhance mobilty    Time 3   Period Weeks   Status New   PT SHORT TERM GOAL #4   Title Patient to be indpendent in correctly and consistently performing appropriate HEP, to be updated PRN    Time 3   Period Weeks   Status New           PT Long Term Goals - 06/21/15 1752    PT LONG TERM GOAL #1   Title Patient to demonstrate strength 5/5 in all tested muscle groups in order to reduce pain, enhance regional stability, and improve gait    Time 6   Period Weeks   Status New   PT LONG TERM GOAL #2   Title Patient to report she has been able to return to yoga with L knee pain no more than 2/10 and with no ROM based limiations that prevent her from particicpating in program in order to assist in returning to PLOF    Time 6   Period Weeks   Status New   PT LONG TERM GOAL #3   Title Patient to be able to ascend/descend full flight of stairs with reciprocal pattern, no railings, minimal unsteadiness, good eccentric control in order to improve overall mobilty at home and in community    Time 6   Period Weeks   Status New   PT LONG TERM GOAL #4   Title Patient to be able to perform TUG in 8 seconds and 5x sit to stand in 12 seconds or less in order to demonstrate improved balance and muscle power for more independence in mobiltyi    Time 6   Period Weeks   Status New               Plan - 07/05/15 1107    Clinical Impression Statement Session focus on improving quad and functional strengthening to improve knee extension.  AROM making great gains at 2-130 degrees this session.  No reports of cramping hamstrings through session.  Continued manual soft tissue mobilization techniques and retro massage for edema control and to reduce knee tightness.  Noted increased  difficutly with stair training this session due to weakness and musculature fatigue.  No reports of pian through session.     PT Next Visit Plan Next session increase weight bearing activities with standing TKE, lunges.  Conitinue PT POC with focus on functional stretches and strengthening, and manuals PRN.  Review  and assess tolerance with CKC ther ex completed.         Problem List Patient Active Problem List   Diagnosis Date Noted  . Medial meniscus, posterior horn derangement   . Arthritis of knee   . Primary osteoarthritis of left knee 04/05/2014  . Diastolic dysfunction    Ihor Austin, LPTA; CBIS [phone]   Aldona Lento 07/05/2015, 11:18 AM  [place], [place], [address] Phone: [phone]   Fax:  [phone]  Name: Laura Carter MRN: [id number] Date of Birth: July 03, 1940

## 2015-07-07 ENCOUNTER — Ambulatory Visit (HOSPITAL_COMMUNITY): Payer: Medicare Other

## 2015-07-07 DIAGNOSIS — R600 Localized edema: Secondary | ICD-10-CM

## 2015-07-07 DIAGNOSIS — M25562 Pain in left knee: Secondary | ICD-10-CM | POA: Diagnosis not present

## 2015-07-07 DIAGNOSIS — M25662 Stiffness of left knee, not elsewhere classified: Secondary | ICD-10-CM | POA: Diagnosis not present

## 2015-07-07 DIAGNOSIS — R262 Difficulty in walking, not elsewhere classified: Secondary | ICD-10-CM

## 2015-07-07 DIAGNOSIS — Z9889 Other specified postprocedural states: Secondary | ICD-10-CM | POA: Diagnosis not present

## 2015-07-07 NOTE — Therapy (Signed)
[place], [place], [address] Phone: [phone]   Fax:  [phone]  Physical Therapy Treatment  Patient Details  Name: ALYRIC LONES MRN: [id number] Date of Birth: 1940/09/13 Referring Provider: Dr. Arther Abbott   Encounter Date: 07/07/2015      PT End of Session - 07/07/15 1022    Visit Number 8   Number of Visits 18   Date for PT Re-Evaluation 07/19/15   Authorization Type Medicare/BCBS Supplemental    Authorization Time Period 06/21/15 to 08/19/15   Authorization - Visit Number 8   Authorization - Number of Visits 10   PT Start Time 1013   PT Stop Time 1107   PT Time Calculation (min) 54 min   Activity Tolerance Patient tolerated treatment well   Behavior During Therapy [facility] for tasks assessed/performed      Past Medical History  Diagnosis Date  . Diastolic dysfunction   . Hypertension   . Asthma   . GERD (gastroesophageal reflux disease)     Past Surgical History  Procedure Laterality Date  . Cholecystectomy  10/10  . Knee arthroscopy with medial menisectomy Left 06/16/2015    Procedure: KNEE ARTHROSCOPY WITH PARTIAL MEDIAL MENISECTOMY;  Surgeon: Carole Civil, MD;  Location: [facility];  Service: Orthopedics;  Laterality: Left;    There were no vitals filed for this visit.  Visit Diagnosis:  Knee stiffness, left  Left knee pain  Localized edema  Difficulty walking  S/P left knee arthroscopy      Subjective Assessment - 07/07/15 1020    Subjective Pt stated the knee sometimes "catches" during gait, no pain unless is catches.  No reports of cramping last couple days.   Pertinent History Patient reports that her knee pain has gone on for awhile; she saw Dr. [name] the fall of 2016, and was told that she had arthritis and a possible meniscus tear. Knee just kept progressing and she began having constant pain late last year. Had arthoscopy performed on Friday the 20th.    Patient  Stated Goals get range and strength, get back to yoga    Currently in Pain? No/denies   Pain Score 2   2/10 when catches during gait   Pain Location Knee   Pain Orientation Left   Pain Descriptors / Indicators Discomfort          [facility] Adult PT Treatment/Exercise - 07/07/15 0001    Knee/Hip Exercises: Standing   Forward Lunges Both;10 reps   Forward Lunges Limitations 4in step   Terminal Knee Extension Limitations TKE 15x 5" with RTB   Lateral Step Up Left;10 reps;Hand Hold: 2;Step Height: 4"   Lateral Step Up Limitations 4" box   Forward Step Up Left;1 set;10 reps;Step Height: 4";Other (comment)   Forward Step Up Limitations 4" box   Functional Squat 15 reps   Rocker Board 2 minutes   Rocker Board Limitations A<>P and M<>L    Gait Training Gait training 5108ft emphasis on TKE with heel to toe and equal stance phase   Manual Therapy   Manual Therapy Joint mobilization;Soft tissue mobilization;Edema management   Manual therapy comments Supine position with LE elevated at beginning of session, separate from other treatment   Edema Management Retro massage with LE elevated   Joint Mobilization L patella mobs using grade I-III in supine    Soft tissue mobilization Lt knee quadricep, especially lateral aspect  PT Education - 07/07/15 1259    Education provided Yes   Education Details Instructed scar tissue massage to reduce adhesions   Person(s) Educated Patient   Methods Explanation;Demonstration   Comprehension Verbalized understanding;Returned demonstration          PT Short Term Goals - 06/21/15 1750    PT SHORT TERM GOAL #1   Title Patient to demonstrate ROM 0-120 degrees L knee in order to improve overall mechanics and enhance mobiltiy    Time 3   Period Weeks   Status New   PT SHORT TERM GOAL #2   Title Patient to be independent in at least 3 edema/pain management techniques, including but not limited to icing, exercise/stretching, retro-grade massage  in roder to increase independence in managing condition    Time 3   Period Weeks   Status New   PT SHORT TERM GOAL #3   Title Paitent to be able to ambulate unlimited distances with no assistive device, improved gait mechanics including equal stance/step lengths/times, improved gait speed, and full L knee ROM in order to enhance mobilty    Time 3   Period Weeks   Status New   PT SHORT TERM GOAL #4   Title Patient to be indpendent in correctly and consistently performing appropriate HEP, to be updated PRN    Time 3   Period Weeks   Status New           PT Long Term Goals - 06/21/15 1752    PT LONG TERM GOAL #1   Title Patient to demonstrate strength 5/5 in all tested muscle groups in order to reduce pain, enhance regional stability, and improve gait    Time 6   Period Weeks   Status New   PT LONG TERM GOAL #2   Title Patient to report she has been able to return to yoga with L knee pain no more than 2/10 and with no ROM based limiations that prevent her from particicpating in program in order to assist in returning to PLOF    Time 6   Period Weeks   Status New   PT LONG TERM GOAL #3   Title Patient to be able to ascend/descend full flight of stairs with reciprocal pattern, no railings, minimal unsteadiness, good eccentric control in order to improve overall mobilty at home and in community    Time 6   Period Weeks   Status New   PT LONG TERM GOAL #4   Title Patient to be able to perform TUG in 8 seconds and 5x sit to stand in 12 seconds or less in order to demonstrate improved balance and muscle power for more independence in mobiltyi    Time 6   Period Weeks   Status New               Plan - 07/07/15 1151    Clinical Impression Statement Began session with gait training to improve TKE with heel strike and cueing for equal stance phase.  Joint mobs complete to improve patella mobility with no reports of catching with gait following manual mobs.  Progressed closed  chain exercises this session for functional strengthening with addition of standing TKE and lunges.  Pt able to demonstrate appropriate form and technique with all exercises following demonstration and cueiing for form.  Pt continues to exhibit LE weakness especially quadriceps with lateral and forward step ups.  Ended session with manual techniques for edema control, scar tissue massage and soft tissue  mobilization to proximal knee and quadricpes.  Noted knot on vastus lateralis, resovled with manual.  Pt instructed scar tissue techniques to reduce adhesions over incision and encouraged to begin at home.  No redness or signs of irritation noted on knee today.  No reports of pain at end of session.     PT Next Visit Plan Next session increase weight bearing activities with standing TKE, lunges.  Conitinue PT POC with focus on functional stretches and strengthening, and manuals PRN.           Problem List Patient Active Problem List   Diagnosis Date Noted  . Medial meniscus, posterior horn derangement   . Arthritis of knee   . Primary osteoarthritis of left knee 04/05/2014  . Diastolic dysfunction    Ihor Austin, LPTA; CBIS [phone]  Aldona Lento 07/07/2015, 1:00 PM  [place], [place], [address] Phone: [phone]   Fax:  [phone]  Name: SHAWNDEE TEJERO MRN: [id number] Date of Birth: 06-Jun-1940

## 2015-07-10 ENCOUNTER — Ambulatory Visit (HOSPITAL_COMMUNITY): Payer: Medicare Other | Admitting: Physical Therapy

## 2015-07-10 DIAGNOSIS — Z9889 Other specified postprocedural states: Secondary | ICD-10-CM

## 2015-07-10 DIAGNOSIS — R262 Difficulty in walking, not elsewhere classified: Secondary | ICD-10-CM | POA: Diagnosis not present

## 2015-07-10 DIAGNOSIS — R600 Localized edema: Secondary | ICD-10-CM

## 2015-07-10 DIAGNOSIS — M25562 Pain in left knee: Secondary | ICD-10-CM

## 2015-07-10 DIAGNOSIS — M25662 Stiffness of left knee, not elsewhere classified: Secondary | ICD-10-CM

## 2015-07-10 NOTE — Therapy (Signed)
[place], [place], [address] Phone: [phone]   Fax:  [phone]  Physical Therapy Treatment  Patient Details  Name: Laura Carter MRN: [id number] Date of Birth: 12-03-1940 Referring Provider: Dr. Arther Abbott   Encounter Date: 07/10/2015      PT End of Session - 07/10/15 1018    Visit Number 9   Number of Visits 18   Date for PT Re-Evaluation 07/19/15   Authorization Type Medicare/BCBS Supplemental    Authorization Time Period 06/21/15 to 08/19/15   Authorization - Visit Number 9   Authorization - Number of Visits 10   PT Start Time 0933   PT Stop Time 1013   PT Time Calculation (min) 40 min   Activity Tolerance Patient tolerated treatment well   Behavior During Therapy [facility] for tasks assessed/performed      Past Medical History  Diagnosis Date  . Diastolic dysfunction   . Hypertension   . Asthma   . GERD (gastroesophageal reflux disease)     Past Surgical History  Procedure Laterality Date  . Cholecystectomy  10/10  . Knee arthroscopy with medial menisectomy Left 06/16/2015    Procedure: KNEE ARTHROSCOPY WITH PARTIAL MEDIAL MENISECTOMY;  Surgeon: Carole Civil, MD;  Location: [facility];  Service: Orthopedics;  Laterality: Left;    There were no vitals filed for this visit.  Visit Diagnosis:  Knee stiffness, left  Left knee pain  Localized edema  Difficulty walking  S/P left knee arthroscopy      Subjective Assessment - 07/10/15 0937    Subjective Patient reports she is doing well today, having some soreness/discomfort with certain movements as she did quite a bit this weekend but reports it has been feeling better the more she moves around.    Pertinent History Patient reports that her knee pain has gone on for awhile; she saw Dr. [name] the fall of 2016, and was told that she had arthritis and a possible meniscus tear. Knee just kept progressing and she began having constant pain  late last year. Had arthoscopy performed on Friday the 20th.    Currently in Pain? No/denies                         [facility] Adult PT Treatment/Exercise - 07/10/15 0001    Knee/Hip Exercises: Stretches   Active Hamstring Stretch Both;3 reps;30 seconds   Active Hamstring Stretch Limitations 12 inch box    Quad Stretch 3 reps;30 seconds;Left   Gastroc Stretch Both;3 reps;30 seconds   Gastroc Stretch Limitations slant board   Knee/Hip Exercises: Standing   Heel Raises Both;1 set;15 reps   Heel Raises Limitations toe and heel    Terminal Knee Extension Limitations TKE 15x 5" with RTB   Lateral Step Up Both;1 set   Forward Step Up Both;1 set;15 reps   Forward Step Up Limitations 4 inch box    Step Down Both;1 set;10 reps   Step Down Limitations 2 inch box    Rocker Board 2 minutes   Rocker Board Limitations A<>P and M<>L    Gait Training Gait training focus on heel-toe pattern, reducing limp                 PT Education - 07/10/15 1018    Education provided Yes   Education Details fear-avoidance cycle in relation to pain, exercise form    Person(s) Educated Patient   Methods Explanation;Demonstration   Comprehension  Verbalized understanding;Need further instruction          PT Short Term Goals - 06/21/15 1750    PT SHORT TERM GOAL #1   Title Patient to demonstrate ROM 0-120 degrees L knee in order to improve overall mechanics and enhance mobiltiy    Time 3   Period Weeks   Status New   PT SHORT TERM GOAL #2   Title Patient to be independent in at least 3 edema/pain management techniques, including but not limited to icing, exercise/stretching, retro-grade massage in roder to increase independence in managing condition    Time 3   Period Weeks   Status New   PT SHORT TERM GOAL #3   Title Paitent to be able to ambulate unlimited distances with no assistive device, improved gait mechanics including equal stance/step lengths/times, improved gait speed,  and full L knee ROM in order to enhance mobilty    Time 3   Period Weeks   Status New   PT SHORT TERM GOAL #4   Title Patient to be indpendent in correctly and consistently performing appropriate HEP, to be updated PRN    Time 3   Period Weeks   Status New           PT Long Term Goals - 06/21/15 1752    PT LONG TERM GOAL #1   Title Patient to demonstrate strength 5/5 in all tested muscle groups in order to reduce pain, enhance regional stability, and improve gait    Time 6   Period Weeks   Status New   PT LONG TERM GOAL #2   Title Patient to report she has been able to return to yoga with L knee pain no more than 2/10 and with no ROM based limiations that prevent her from particicpating in program in order to assist in returning to PLOF    Time 6   Period Weeks   Status New   PT LONG TERM GOAL #3   Title Patient to be able to ascend/descend full flight of stairs with reciprocal pattern, no railings, minimal unsteadiness, good eccentric control in order to improve overall mobilty at home and in community    Time 6   Period Weeks   Status New   PT LONG TERM GOAL #4   Title Patient to be able to perform TUG in 8 seconds and 5x sit to stand in 12 seconds or less in order to demonstrate improved balance and muscle power for more independence in mobiltyi    Time 6   Period Weeks   Status New               Plan - 07/10/15 1019    Clinical Impression Statement Continued focus on functional exercises and stretches today, intermittent cues for exercise form. Intorduced step downs today initially on 4 inch box and then progressing to 2 inch box due to pain/anxiety; patient continued to demonstrate some anxiety and fear-avoidance behavior with step downs, so PT modified activity to be inside of parallel bars for maximal visual cues, support, and to assist in breaking fear-avoidnace cycle. Patient did demonstrate incresaed ease of motion and form within parallel bars today hwoever  continued to have some anxiety about her performance with functional exercise and gait, stating "I just don't feel like I'm doing it right"; educated patient about cycle and on techniques to break cycel and improve physical performance today.    Pt will benefit from skilled therapeutic intervention in order to improve on  the following deficits Abnormal gait;Hypomobility;Decreased scar mobility;Decreased activity tolerance;Decreased strength;Pain;Decreased balance;Decreased range of motion;Postural dysfunction   Rehab Potential Excellent   Clinical Impairments Affecting Rehab Potential respiratory limitations- chronic bronchitis and asthma    PT Frequency 3x / week   PT Duration 6 weeks   PT Treatment/Interventions ADLs/Self Care Home Management;Cryotherapy;Gait training;DME Instruction;Stair training;Functional mobility training;Therapeutic activities;Therapeutic exercise;Balance training;Neuromuscular re-education;Patient/family education;Manual techniques;Scar mobilization   PT Next Visit Plan G-code due. Next session increase weight bearing activities with standing TKE, lunges.  Conitinue PT POC with focus on functional stretches and strengthening, and manuals PRN.      PT Home Exercise Plan Reviewed HEP with no changes made this visit   Consulted and Agree with Plan of Care Patient        Problem List Patient Active Problem List   Diagnosis Date Noted  . Medial meniscus, posterior horn derangement   . Arthritis of knee   . Primary osteoarthritis of left knee 04/05/2014  . Diastolic dysfunction     Deniece Ree PT, DPT [place] [address] [place], [place], [address] Phone: [phone]   Fax:  [phone]  Name: Laura Carter MRN: [id number] Date of Birth: [birth year]

## 2015-07-13 ENCOUNTER — Ambulatory Visit (HOSPITAL_COMMUNITY): Payer: Medicare Other | Admitting: Physical Therapy

## 2015-07-13 DIAGNOSIS — M25562 Pain in left knee: Secondary | ICD-10-CM

## 2015-07-13 DIAGNOSIS — M25662 Stiffness of left knee, not elsewhere classified: Secondary | ICD-10-CM | POA: Diagnosis not present

## 2015-07-13 DIAGNOSIS — R262 Difficulty in walking, not elsewhere classified: Secondary | ICD-10-CM

## 2015-07-13 DIAGNOSIS — Z9889 Other specified postprocedural states: Secondary | ICD-10-CM | POA: Diagnosis not present

## 2015-07-13 DIAGNOSIS — R600 Localized edema: Secondary | ICD-10-CM | POA: Diagnosis not present

## 2015-07-13 NOTE — Therapy (Signed)
[place], [place], [address] Phone: [phone]   Fax:  [phone]  Physical Therapy Treatment (G-code done)  Patient Details  Name: Laura Carter MRN: [id number] Date of Birth: 1940-11-02 Referring Provider: Dr. Arther Abbott   Encounter Date: 07/13/2015      PT End of Session - 07/13/15 1023    Visit Number 10   Number of Visits 18   Date for PT Re-Evaluation 07/19/15   Authorization Type Medicare/BCBS Supplemental (G-code done 10th session)   Authorization Time Period 06/21/15 to 08/19/15   Authorization - Visit Number 10   Authorization - Number of Visits 20   PT Start Time 0935   PT Stop Time 1015   PT Time Calculation (min) 40 min   Activity Tolerance Patient tolerated treatment well   Behavior During Therapy [facility] for tasks assessed/performed      Past Medical History  Diagnosis Date  . Diastolic dysfunction   . Hypertension   . Asthma   . GERD (gastroesophageal reflux disease)     Past Surgical History  Procedure Laterality Date  . Cholecystectomy  10/10  . Knee arthroscopy with medial menisectomy Left 06/16/2015    Procedure: KNEE ARTHROSCOPY WITH PARTIAL MEDIAL MENISECTOMY;  Surgeon: Carole Civil, MD;  Location: [facility];  Service: Orthopedics;  Laterality: Left;    There were no vitals filed for this visit.  Visit Diagnosis:  Knee stiffness, left  Left knee pain  Localized edema  Difficulty walking  S/P left knee arthroscopy      Subjective Assessment - 07/13/15 0938    Subjective Patient reports she is doing well today except that something happened last night in her sleep with great pain in the middle of the night and she had to shift around and chang positions; not sure if she is sleeping in a certain position where the leg is lockign up. It was more difficult to stretch and move around.    Pertinent History Patient reports that her knee pain has gone on for awhile; she saw  Dr. [name] the fall of 2016, and was told that she had arthritis and a possible meniscus tear. Knee just kept progressing and she began having constant pain late last year. Had arthoscopy performed on Friday the 20th.    Patient Stated Goals get range and strength, get back to yoga    Currently in Pain? No/denies  no true pain, just some discomfort                          [facility] Adult PT Treatment/Exercise - 07/13/15 0001    Knee/Hip Exercises: Stretches   Active Hamstring Stretch Both;3 reps;30 seconds   Active Hamstring Stretch Limitations 12 inch box    Quad Stretch 3 reps;30 seconds;Left   Gastroc Stretch Both;3 reps;30 seconds   Gastroc Stretch Limitations slant board   Knee/Hip Exercises: Standing   Heel Raises Both;1 set;15 reps   Heel Raises Limitations toe and heel    Forward Lunges Both;10 reps   Forward Lunges Limitations 4in step   Hip Abduction Both;1 set;10 reps   Abduction Limitations 2 second hold    Lateral Step Up --   Lateral Step Up Limitations --   Step Down Both;1 set;10 reps   Step Down Limitations 2 inch box    Rocker Board 2 minutes   Rocker Board Limitations A<>P and M<>L  PT Education - 07/13/15 1023    Education provided No          PT Short Term Goals - 06/21/15 1750    PT SHORT TERM GOAL #1   Title Patient to demonstrate ROM 0-120 degrees L knee in order to improve overall mechanics and enhance mobiltiy    Time 3   Period Weeks   Status New   PT SHORT TERM GOAL #2   Title Patient to be independent in at least 3 edema/pain management techniques, including but not limited to icing, exercise/stretching, retro-grade massage in roder to increase independence in managing condition    Time 3   Period Weeks   Status New   PT SHORT TERM GOAL #3   Title Paitent to be able to ambulate unlimited distances with no assistive device, improved gait mechanics including equal stance/step lengths/times, improved gait  speed, and full L knee ROM in order to enhance mobilty    Time 3   Period Weeks   Status New   PT SHORT TERM GOAL #4   Title Patient to be indpendent in correctly and consistently performing appropriate HEP, to be updated PRN    Time 3   Period Weeks   Status New           PT Long Term Goals - 06/21/15 1752    PT LONG TERM GOAL #1   Title Patient to demonstrate strength 5/5 in all tested muscle groups in order to reduce pain, enhance regional stability, and improve gait    Time 6   Period Weeks   Status New   PT LONG TERM GOAL #2   Title Patient to report she has been able to return to yoga with L knee pain no more than 2/10 and with no ROM based limiations that prevent her from particicpating in program in order to assist in returning to PLOF    Time 6   Period Weeks   Status New   PT LONG TERM GOAL #3   Title Patient to be able to ascend/descend full flight of stairs with reciprocal pattern, no railings, minimal unsteadiness, good eccentric control in order to improve overall mobilty at home and in community    Time 6   Period Weeks   Status New   PT LONG TERM GOAL #4   Title Patient to be able to perform TUG in 8 seconds and 5x sit to stand in 12 seconds or less in order to demonstrate improved balance and muscle power for more independence in mobiltyi    Time 6   Period Weeks   Status New               Plan - 07/13/15 1007    Clinical Impression Statement Continued focus on functional exercises and stretching today with focus on functional strength of hip stablizers and quads/hamstrings. Ceus for form throughout session. Cotninues to have some stress and anxiety regarding pain with step downs and continued to perform this in parallel bars, continues to have difficulty with step down task specifically.    Pt will benefit from skilled therapeutic intervention in order to improve on the following deficits Abnormal gait;Hypomobility;Decreased scar mobility;Decreased  activity tolerance;Decreased strength;Pain;Decreased balance;Decreased range of motion;Postural dysfunction   Rehab Potential Excellent   Clinical Impairments Affecting Rehab Potential respiratory limitations- chronic bronchitis and asthma    PT Frequency 3x / week   PT Duration 6 weeks   PT Treatment/Interventions ADLs/Self Care Home Management;Cryotherapy;Gait training;DME Instruction;Stair training;Functional mobility  training;Therapeutic activities;Therapeutic exercise;Balance training;Neuromuscular re-education;Patient/family education;Manual techniques;Scar mobilization   PT Next Visit Plan Next session increase weight bearing activities with standing TKE, lunges.  Conitinue PT POC with focus on functional stretches and strengthening, and manuals PRN.      PT Home Exercise Plan Reviewed HEP with no changes made this visit   Consulted and Agree with Plan of Care Patient          G-Codes - 07/31/15 1025    Functional Assessment Tool Used FOTO 59% limited    Functional Limitation Mobility: Walking and moving around   Mobility: Walking and Moving Around Current Status [phone]) At least 40 percent but less than 60 percent impaired, limited or restricted   Mobility: Walking and Moving Around Goal Status [phone]) At least 40 percent but less than 60 percent impaired, limited or restricted      Problem List Patient Active Problem List   Diagnosis Date Noted  . Medial meniscus, posterior horn derangement   . Arthritis of knee   . Primary osteoarthritis of left knee 04/05/2014  . Diastolic dysfunction     Deniece Ree PT, DPT [place] [address] [place], [place], [address] Phone: [phone]   Fax:  [phone]  Name: Laura Carter MRN: [id number] Date of Birth: 03/24/1941

## 2015-07-17 ENCOUNTER — Ambulatory Visit (HOSPITAL_COMMUNITY): Payer: Medicare Other | Admitting: Physical Therapy

## 2015-07-17 DIAGNOSIS — R262 Difficulty in walking, not elsewhere classified: Secondary | ICD-10-CM

## 2015-07-17 DIAGNOSIS — M25662 Stiffness of left knee, not elsewhere classified: Secondary | ICD-10-CM | POA: Diagnosis not present

## 2015-07-17 DIAGNOSIS — Z9889 Other specified postprocedural states: Secondary | ICD-10-CM

## 2015-07-17 DIAGNOSIS — R600 Localized edema: Secondary | ICD-10-CM

## 2015-07-17 DIAGNOSIS — M25562 Pain in left knee: Secondary | ICD-10-CM

## 2015-07-17 NOTE — Therapy (Signed)
[place], [place], [address] Phone: [phone]   Fax:  [phone]  Physical Therapy Treatment (Re-assess)  Patient Details  Name: Laura Carter MRN: [id number] Date of Birth: 03-Jun-1940 Referring Provider: Dr. Arther Abbott   Encounter Date: 07/17/2015      PT End of Session - 07/17/15 1029    Visit Number 11   Number of Visits 23   Date for PT Re-Evaluation 08/14/15   Authorization Type Medicare/BCBS Supplemental (G-code done 10th session)   Authorization Time Period 06/21/15 to 08/19/15   Authorization - Visit Number 11   Authorization - Number of Visits 20   PT Start Time 0935   PT Stop Time 1028   PT Time Calculation (min) 53 min   Activity Tolerance Patient tolerated treatment well   Behavior During Therapy [facility] for tasks assessed/performed      Past Medical History  Diagnosis Date  . Diastolic dysfunction   . Hypertension   . Asthma   . GERD (gastroesophageal reflux disease)     Past Surgical History  Procedure Laterality Date  . Cholecystectomy  10/10  . Knee arthroscopy with medial menisectomy Left 06/16/2015    Procedure: KNEE ARTHROSCOPY WITH PARTIAL MEDIAL MENISECTOMY;  Surgeon: Carole Civil, MD;  Location: [facility];  Service: Orthopedics;  Laterality: Left;    There were no vitals filed for this visit.  Visit Diagnosis:  Knee stiffness, left  Left knee pain  Localized edema  Difficulty walking  S/P left knee arthroscopy      Subjective Assessment - 07/17/15 0937    Subjective Patient rpeorts she is doing well but has been very pre-occupied about cataract surgery that she is having done tomorrow morning, is just anxious as she just does not know what to expect. Becasue of the knee specifically, she is still having a hard time with steps, especially step-downs. Still icing a couple of tmies a day. Was very inactive on Saturday but busy Sunday, in general treid to stay off of  leg. Has been doing exercises as she moves around throughtout the day. Thinks she is doing well but is not where she'd like to be, still feels hampered.     Pertinent History Patient reports that her knee pain has gone on for awhile; she saw Dr. [name] the fall of 2016, and was told that she had arthritis and a possible meniscus tear. Knee just kept progressing and she began having constant pain late last year. Had arthoscopy performed on Friday the 20th.    How long can you stand comfortably? 2/20- has to shift, still around 30 minutes before    How long can you walk comfortably? 2/20- has been going to grocery strore with cart, otherwise unable to give specific answer today    Patient Stated Goals get range and strength, get back to yoga    Currently in Pain? No/denies            [facility] PT Assessment - 07/17/15 0001    Observation/Other Assessments   Observations incision appears to be well healed with full closure of scar tissue    Focus on Therapeutic Outcomes (FOTO)  59% limited    AROM   Left Knee Extension 8  patient reports she has never been able to get knee straight   Left Knee Flexion 132   Strength   Right Hip Flexion 4+/5   Right Hip Extension 3/5   Right Hip ABduction 5/5  Left Hip Flexion 4+/5   Left Hip Extension 3/5   Left Hip ABduction 4/5   Right Knee Flexion 3+/5   Right Knee Extension 4/5   Left Knee Flexion 4/5   Left Knee Extension 4/5   Right Ankle Dorsiflexion 5/5   Left Ankle Dorsiflexion 5/5   Transfers   Five time sit to stand comments  5x sit to stand 11.23    6 minute walk test results    Aerobic Endurance Distance Walked 791   Endurance additional comments 6MWT; no device, no rest breaks    High Level Balance   High Level Balance Comments TUG 12.38 without device                      [facility] Adult PT Treatment/Exercise - 07/17/15 0001    Knee/Hip Exercises: Stretches   Active Hamstring Stretch Both;3 reps;30 seconds   Active  Hamstring Stretch Limitations 12 inch box    Knee/Hip Exercises: Standing   Forward Lunges --   Forward Lunges Limitations --   Forward Step Up Both;1 set;15 reps   Forward Step Up Limitations 4 inch box    Gait Training Gait with focus on pelvic stabilizaation and activation of hip abductors, no device    Other Standing Knee Exercises eccentric sit to stand 1x15, 3 second lowers                 PT Education - 07/17/15 1029    Education provided Yes   Education Details progress with skilled PT services, ROM technical numbers, improtance of hip abductors in gait    Person(s) Educated Patient   Methods Explanation   Comprehension Verbalized understanding          PT Short Term Goals - 07/17/15 1003    PT SHORT TERM GOAL #1   Title Patient to demonstrate ROM 0-120 degrees L knee in order to improve overall mechanics and enhance mobiltiy    Baseline 2/20- 8-132; patient reports she had never been able to get L knee completely straight even before surgery    Time 3   Period Weeks   Status Partially Met   PT SHORT TERM GOAL #2   Title Patient to be independent in at least 3 edema/pain management techniques, including but not limited to icing, exercise/stretching, retro-grade massage in roder to increase independence in managing condition    Baseline 2/20- has been doing some but not a lot    Time 3   Period Weeks   Status On-going   PT SHORT TERM GOAL #3   Title Paitent to be able to ambulate unlimited distances with no assistive device, improved gait mechanics including equal stance/step lengths/times, improved gait speed, and full L knee ROM in order to enhance mobilty    Baseline 2/20- patient is still taking cane with her but does not use it a lot; however does have a bit of trouble with mechanics the further she walks    Time 3   Period Weeks   Status On-going   PT SHORT TERM GOAL #4   Title Patient to be indpendent in correctly and consistently performing appropriate  HEP, to be updated PRN    Baseline 2/20- doing everything throughout the day    Time 3   Period Weeks   Status Achieved           PT Long Term Goals - 07/17/15 1008    PT LONG TERM GOAL #1   Title Patient  to demonstrate strength 5/5 in all tested muscle groups in order to reduce pain, enhance regional stability, and improve gait    Time 6   Period Weeks   Status On-going   PT LONG TERM GOAL #2   Title Patient to report she has been able to return to yoga with L knee pain no more than 2/10 and with no ROM based limiations that prevent her from particicpating in program in order to assist in returning to PLOF    Baseline 2/20- has not tried yet but reports she may get private lessons to start off    Time 6   Period Weeks   Status On-going   PT LONG TERM GOAL #3   Title Patient to be able to ascend/descend full flight of stairs with reciprocal pattern, no railings, minimal unsteadiness, good eccentric control in order to improve overall mobilty at home and in community    Baseline 2/20- stairs is still a major difficulty for patient    Time 6   Period Weeks   Status On-going   PT LONG TERM GOAL #4   Title Patient to be able to perform TUG in 8 seconds and 5x sit to stand in 12 seconds or less in order to demonstrate improved balance and muscle power for more independence in mobiltyi    Baseline 2/20- TUG 12 seconds and 5x sit to stand in 11.23 seconds    Time 6   Period Weeks   Status Partially Met               Plan - 07/17/15 1014    Clinical Impression Statement Re-assessment performed today. Patient shows improvement in strength, gait, ROM, and functional transfer ability/muscle power, however her balance does remain roughly the same; she does continue to struggle with maintaining proper gait mechanics with extended gait distance, her balance without device, and with stairs- with step downs specifically. Patient remains motivated to continue with skilled PT services  today, but reports she is very stressed and anxious due to upcoming cataract surgery tomorrow. Patient reports that even before her surgery with Dr. [name], she had never been able to get her L knee out completely straight and has been going to massage over time to try and correct this. At this time recommend continuation of skilled PT servies ino rder to reduce functional limitations and assist in meeting goals yet unmet.    Pt will benefit from skilled therapeutic intervention in order to improve on the following deficits Abnormal gait;Hypomobility;Decreased scar mobility;Decreased activity tolerance;Decreased strength;Pain;Decreased balance;Decreased range of motion;Postural dysfunction   Rehab Potential Excellent   Clinical Impairments Affecting Rehab Potential respiratory limitations- chronic bronchitis and asthma    PT Frequency 3x / week   PT Duration 4 weeks   PT Treatment/Interventions ADLs/Self Care Home Management;Cryotherapy;Gait training;DME Instruction;Stair training;Functional mobility training;Therapeutic activities;Therapeutic exercise;Balance training;Neuromuscular re-education;Patient/family education;Manual techniques;Scar mobilization   PT Next Visit Plan Increase focus on hip abductor strength for improved gait mechanics. Next session increase weight bearing activities with standing TKE, lunges.  Conitinue PT POC with focus on functional stretches and strengthening, and manuals PRN.      PT Home Exercise Plan Reviewed HEP with no changes made this visit   Consulted and Agree with Plan of Care Patient        Problem List Patient Active Problem List   Diagnosis Date Noted  . Medial meniscus, posterior horn derangement   . Arthritis of knee   . Primary osteoarthritis of left knee 04/05/2014  .  Diastolic dysfunction     Deniece Ree PT, DPT [facility] [address] [place], [place], [address] Phone:  [phone]   Fax:  [phone]  Name: Laura Carter MRN: [id number] Date of Birth: 11-Dec-1940

## 2015-07-18 ENCOUNTER — Ambulatory Visit: Payer: Medicare Other | Admitting: Orthopedic Surgery

## 2015-07-18 DIAGNOSIS — H25813 Combined forms of age-related cataract, bilateral: Secondary | ICD-10-CM | POA: Diagnosis not present

## 2015-07-18 DIAGNOSIS — H52201 Unspecified astigmatism, right eye: Secondary | ICD-10-CM | POA: Diagnosis not present

## 2015-07-18 DIAGNOSIS — H268 Other specified cataract: Secondary | ICD-10-CM | POA: Diagnosis not present

## 2015-07-19 ENCOUNTER — Encounter (HOSPITAL_COMMUNITY): Payer: Medicare Other

## 2015-07-20 ENCOUNTER — Encounter (HOSPITAL_COMMUNITY): Payer: Medicare Other

## 2015-07-21 ENCOUNTER — Ambulatory Visit (INDEPENDENT_AMBULATORY_CARE_PROVIDER_SITE_OTHER): Payer: Self-pay | Admitting: Orthopedic Surgery

## 2015-07-21 ENCOUNTER — Encounter: Payer: Self-pay | Admitting: Orthopedic Surgery

## 2015-07-21 VITALS — BP 147/102 | Ht 65.0 in | Wt 174.0 lb

## 2015-07-21 DIAGNOSIS — Z9889 Other specified postprocedural states: Secondary | ICD-10-CM

## 2015-07-21 DIAGNOSIS — Z4789 Encounter for other orthopedic aftercare: Secondary | ICD-10-CM

## 2015-07-21 NOTE — Progress Notes (Signed)
Chief Complaint  Patient presents with  . 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 Her wounds look fine the portal sites are clean dry and intact healed nicely. Her extension is excellent her flexion has improved to 120. She's not having any real problems. 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

## 2015-07-24 ENCOUNTER — Ambulatory Visit (HOSPITAL_COMMUNITY): Payer: Medicare Other | Admitting: Physical Therapy

## 2015-07-24 DIAGNOSIS — M25562 Pain in left knee: Secondary | ICD-10-CM | POA: Diagnosis not present

## 2015-07-24 DIAGNOSIS — R262 Difficulty in walking, not elsewhere classified: Secondary | ICD-10-CM

## 2015-07-24 DIAGNOSIS — R600 Localized edema: Secondary | ICD-10-CM | POA: Diagnosis not present

## 2015-07-24 DIAGNOSIS — Z9889 Other specified postprocedural states: Secondary | ICD-10-CM

## 2015-07-24 DIAGNOSIS — M25662 Stiffness of left knee, not elsewhere classified: Secondary | ICD-10-CM

## 2015-07-24 NOTE — Therapy (Signed)
[place], [place], [address] Phone: [phone]   Fax:  [phone]  Physical Therapy Treatment  Patient Details  Name: KATELYNNE REVAK MRN: [id number] Date of Birth: 25-Nov-1940 Referring Provider: Dr. Arther Abbott   Encounter Date: 07/24/2015      PT End of Session - 07/24/15 1016    Visit Number 12   Number of Visits 23   Date for PT Re-Evaluation 08/14/15   Authorization Type Medicare/BCBS Supplemental (G-code done 10th session)   Authorization Time Period 06/21/15 to 08/19/15   Authorization - Visit Number 12   Authorization - Number of Visits 20   PT Start Time 0933   PT Stop Time 8756   PT Time Calculation (min) 41 min   Activity Tolerance Patient tolerated treatment well   Behavior During Therapy [facility] for tasks assessed/performed      Past Medical History  Diagnosis Date  . Diastolic dysfunction   . Hypertension   . Asthma   . GERD (gastroesophageal reflux disease)     Past Surgical History  Procedure Laterality Date  . Cholecystectomy  10/10  . Knee arthroscopy with medial menisectomy Left 06/16/2015    Procedure: KNEE ARTHROSCOPY WITH PARTIAL MEDIAL MENISECTOMY;  Surgeon: Carole Civil, MD;  Location: [facility];  Service: Orthopedics;  Laterality: Left;    There were no vitals filed for this visit.  Visit Diagnosis:  Knee stiffness, left  Left knee pain  Difficulty walking  S/P left knee arthroscopy      Subjective Assessment - 07/24/15 0937    Subjective Saw the doctor on Friday and things are going well. Feeling link the quadriceps muscles are the main poblem right now. Did have cataract surgery and it seems to have gone well.    Patient Stated Goals get range and strength, get back to yoga    Currently in Pain? No/denies   Aggravating Factors  occasional discomfort when up and moving around   Pain Relieving Factors change positions, sit.                           [facility] Adult PT Treatment/Exercise - 07/24/15 0001    Knee/Hip Exercises: Stretches   Active Hamstring Stretch Both;3 reps;30 seconds   Active Hamstring Stretch Limitations 12 inch box    Quad Stretch 3 reps;30 seconds;Left   Knee/Hip Exercises: Standing   Heel Raises Both;1 set;10 reps   Lateral Step Up Left;10 reps;Step Height: 4";2 sets   Forward Step Up Left;10 reps;Step Height: 4";2 sets   Forward Step Up Limitations 4 inch box    Knee/Hip Exercises: Seated   Sit to Sand 1 set;5 reps;without UE support  cues for even weight                PT Education - 07/24/15 1015    Education provided Yes   Education Details discussed compensations and awareness with mobility and exercises.    Person(s) Educated Patient   Methods Explanation   Comprehension Verbalized understanding;Returned demonstration          PT Short Term Goals - 07/17/15 1003    PT SHORT TERM GOAL #1   Title Patient to demonstrate ROM 0-120 degrees L knee in order to improve overall mechanics and enhance mobiltiy    Baseline 2/20- 8-132; patient reports she had never been able to get L knee completely straight even before surgery    Time 3  Period Weeks   Status Partially Met   PT SHORT TERM GOAL #2   Title Patient to be independent in at least 3 edema/pain management techniques, including but not limited to icing, exercise/stretching, retro-grade massage in roder to increase independence in managing condition    Baseline 2/20- has been doing some but not a lot    Time 3   Period Weeks   Status On-going   PT SHORT TERM GOAL #3   Title Paitent to be able to ambulate unlimited distances with no assistive device, improved gait mechanics including equal stance/step lengths/times, improved gait speed, and full L knee ROM in order to enhance mobilty    Baseline 2/20- patient is still taking cane with her but does not use it a lot; however does have a bit of trouble with mechanics the further she walks     Time 3   Period Weeks   Status On-going   PT SHORT TERM GOAL #4   Title Patient to be indpendent in correctly and consistently performing appropriate HEP, to be updated PRN    Baseline 2/20- doing everything throughout the day    Time 3   Period Weeks   Status Achieved           PT Long Term Goals - 07/17/15 1008    PT LONG TERM GOAL #1   Title Patient to demonstrate strength 5/5 in all tested muscle groups in order to reduce pain, enhance regional stability, and improve gait    Time 6   Period Weeks   Status On-going   PT LONG TERM GOAL #2   Title Patient to report she has been able to return to yoga with L knee pain no more than 2/10 and with no ROM based limiations that prevent her from particicpating in program in order to assist in returning to PLOF    Baseline 2/20- has not tried yet but reports she may get private lessons to start off    Time 6   Period Weeks   Status On-going   PT LONG TERM GOAL #3   Title Patient to be able to ascend/descend full flight of stairs with reciprocal pattern, no railings, minimal unsteadiness, good eccentric control in order to improve overall mobilty at home and in community    Baseline 2/20- stairs is still a major difficulty for patient    Time 6   Period Weeks   Status On-going   PT LONG TERM GOAL #4   Title Patient to be able to perform TUG in 8 seconds and 5x sit to stand in 12 seconds or less in order to demonstrate improved balance and muscle power for more independence in mobiltyi    Baseline 2/20- TUG 12 seconds and 5x sit to stand in 11.23 seconds    Time 6   Period Weeks   Status Partially Met               Plan - 07/24/15 1017    Clinical Impression Statement Patient progressing with strength and flexibility. Progressing activity as tolerated during PT sessions with focus on active treatments. The patient does need cues to avoid compensations and occasional correction with technique. Continue to progress as  tolerated.    PT Next Visit Plan Increase focus on hip abductor strength for improved gait mechanics. Next session increase weight bearing activities with standing TKE, lunges.  Conitinue PT POC with focus on functional stretches and strengthening, and manuals PRN.      PT  Home Exercise Plan Reviewed HEP with no changes made this visit   Consulted and Agree with Plan of Care Patient        Problem List Patient Active Problem List   Diagnosis Date Noted  . Medial meniscus, posterior horn derangement   . Arthritis of knee   . Primary osteoarthritis of left knee 04/05/2014  . Diastolic dysfunction     Cassell Clement, PT, CSCS Pager [phone]  07/24/2015, 10:20 AM  [place], [place], [address] Phone: [phone]   Fax:  [phone]  Name: SREENIDHI GANSON MRN: [id number] Date of Birth: February 20, 1941

## 2015-07-26 ENCOUNTER — Ambulatory Visit (HOSPITAL_COMMUNITY): Payer: Medicare Other | Attending: Orthopedic Surgery | Admitting: Physical Therapy

## 2015-07-26 DIAGNOSIS — Z9889 Other specified postprocedural states: Secondary | ICD-10-CM | POA: Diagnosis not present

## 2015-07-26 DIAGNOSIS — R600 Localized edema: Secondary | ICD-10-CM | POA: Diagnosis not present

## 2015-07-26 DIAGNOSIS — M25562 Pain in left knee: Secondary | ICD-10-CM | POA: Diagnosis not present

## 2015-07-26 DIAGNOSIS — R262 Difficulty in walking, not elsewhere classified: Secondary | ICD-10-CM

## 2015-07-26 DIAGNOSIS — M25662 Stiffness of left knee, not elsewhere classified: Secondary | ICD-10-CM | POA: Insufficient documentation

## 2015-07-26 NOTE — Therapy (Signed)
[place], [place], [address] Phone: [phone]   Fax:  [phone]  Physical Therapy Treatment  Patient Details  Name: Laura Carter MRN: [id number] Date of Birth: 28-Apr-1941 Referring Provider: Dr. Arther Abbott   Encounter Date: 07/26/2015      PT End of Session - 07/26/15 1630    Visit Number 13   Number of Visits 23   Date for PT Re-Evaluation 08/14/15   Authorization Type Medicare/BCBS Supplemental (G-code done 10th session)   Authorization Time Period 06/21/15 to 08/19/15   Authorization - Visit Number 13   Authorization - Number of Visits 20   PT Start Time 0932   PT Stop Time 1015   PT Time Calculation (min) 43 min   Activity Tolerance Patient tolerated treatment well      Past Medical History  Diagnosis Date  . Diastolic dysfunction   . Hypertension   . Asthma   . GERD (gastroesophageal reflux disease)     Past Surgical History  Procedure Laterality Date  . Cholecystectomy  10/10  . Knee arthroscopy with medial menisectomy Left 06/16/2015    Procedure: KNEE ARTHROSCOPY WITH PARTIAL MEDIAL MENISECTOMY;  Surgeon: Carole Civil, MD;  Location: [facility];  Service: Orthopedics;  Laterality: Left;    There were no vitals filed for this visit.  Visit Diagnosis:  Knee stiffness, left  Left knee pain  Difficulty walking      Subjective Assessment - 07/26/15 0938    Subjective Not having pain but having discomfort. Did have a full body massage yesterday as well.    Currently in Pain? No/denies                         [facility] Adult PT Treatment/Exercise - 07/26/15 0001    Knee/Hip Exercises: Stretches   Active Hamstring Stretch Both;3 reps;30 seconds   Active Hamstring Stretch Limitations 12 inch box    Quad Stretch 3 reps;30 seconds;Left   Knee/Hip Exercises: Standing   Heel Raises Both;1 set;15 reps   Lateral Step Up 1 set;Step Height: 4";10 reps   Forward Step Up 1  set;10 reps;Step Height: 6"   Functional Squat 1 set;15 reps   Other Standing Knee Exercises single leg stance, 5 second holds on Lt, to add to HEP. Performing at counter for UE support if needed.    Knee/Hip Exercises: Supine   Heel Slides Left;1 set;10 reps   Knee Flexion Limitations 130 degrees                PT Education - 07/26/15 1629    Education provided Yes   Education Details even weight with sit/stand and avoiding compensations with stepups.    Person(s) Educated Patient   Methods Explanation;Demonstration;Tactile cues;Verbal cues   Comprehension Verbalized understanding;Returned demonstration          PT Short Term Goals - 07/17/15 1003    PT SHORT TERM GOAL #1   Title Patient to demonstrate ROM 0-120 degrees L knee in order to improve overall mechanics and enhance mobiltiy    Baseline 2/20- 8-132; patient reports she had never been able to get L knee completely straight even before surgery    Time 3   Period Weeks   Status Partially Met   PT SHORT TERM GOAL #2   Title Patient to be independent in at least 3 edema/pain management techniques, including but not limited to icing, exercise/stretching, retro-grade massage in roder  to increase independence in managing condition    Baseline 2/20- has been doing some but not a lot    Time 3   Period Weeks   Status On-going   PT SHORT TERM GOAL #3   Title Paitent to be able to ambulate unlimited distances with no assistive device, improved gait mechanics including equal stance/step lengths/times, improved gait speed, and full L knee ROM in order to enhance mobilty    Baseline 2/20- patient is still taking cane with her but does not use it a lot; however does have a bit of trouble with mechanics the further she walks    Time 3   Period Weeks   Status On-going   PT SHORT TERM GOAL #4   Title Patient to be indpendent in correctly and consistently performing appropriate HEP, to be updated PRN    Baseline 2/20- doing  everything throughout the day    Time 3   Period Weeks   Status Achieved           PT Long Term Goals - 07/17/15 1008    PT LONG TERM GOAL #1   Title Patient to demonstrate strength 5/5 in all tested muscle groups in order to reduce pain, enhance regional stability, and improve gait    Time 6   Period Weeks   Status On-going   PT LONG TERM GOAL #2   Title Patient to report she has been able to return to yoga with L knee pain no more than 2/10 and with no ROM based limiations that prevent her from particicpating in program in order to assist in returning to PLOF    Baseline 2/20- has not tried yet but reports she may get private lessons to start off    Time 6   Period Weeks   Status On-going   PT LONG TERM GOAL #3   Title Patient to be able to ascend/descend full flight of stairs with reciprocal pattern, no railings, minimal unsteadiness, good eccentric control in order to improve overall mobilty at home and in community    Baseline 2/20- stairs is still a major difficulty for patient    Time 6   Period Weeks   Status On-going   PT LONG TERM GOAL #4   Title Patient to be able to perform TUG in 8 seconds and 5x sit to stand in 12 seconds or less in order to demonstrate improved balance and muscle power for more independence in mobiltyi    Baseline 2/20- TUG 12 seconds and 5x sit to stand in 11.23 seconds    Time 6   Period Weeks   Status Partially Met               Plan - 07/26/15 1631    Clinical Impression Statement Patient progressing with PT sessions with gains in strength and ROM. Able to increase step height with lateral step-ups. Continued cues needed occasionally to avoid compensations during program. Continue with skilled PT sessions.    PT Next Visit Plan Increase focus on hip abductor strength for improved gait mechanics. Next session increase weight bearing activities with standing TKE, lunges.    PT Home Exercise Plan No change to HEP made.    Consulted and  Agree with Plan of Care Patient        Problem List Patient Active Problem List   Diagnosis Date Noted  . Medial meniscus, posterior horn derangement   . Arthritis of knee   . Primary osteoarthritis of left knee  04/05/2014  . Diastolic dysfunction     Cassell Clement, PT, CSCS Pager [phone]  07/26/2015, 4:34 PM  [place], [place], [address] Phone: [phone]   Fax:  [phone]  Name: Laura Carter MRN: [id number] Date of Birth: [birth year]

## 2015-07-28 ENCOUNTER — Ambulatory Visit (HOSPITAL_COMMUNITY): Payer: Medicare Other

## 2015-07-28 DIAGNOSIS — R600 Localized edema: Secondary | ICD-10-CM

## 2015-07-28 DIAGNOSIS — M25562 Pain in left knee: Secondary | ICD-10-CM | POA: Diagnosis not present

## 2015-07-28 DIAGNOSIS — M25662 Stiffness of left knee, not elsewhere classified: Secondary | ICD-10-CM | POA: Diagnosis not present

## 2015-07-28 DIAGNOSIS — Z9889 Other specified postprocedural states: Secondary | ICD-10-CM | POA: Diagnosis not present

## 2015-07-28 DIAGNOSIS — R262 Difficulty in walking, not elsewhere classified: Secondary | ICD-10-CM

## 2015-07-28 NOTE — Therapy (Signed)
[place], [place], [address] Phone: [phone]   Fax:  [phone]  Physical Therapy Treatment  Patient Details  Name: Laura Carter MRN: [id number] Date of Birth: June 02, 1940 Referring Provider: Dr. Arther Abbott   Encounter Date: 07/28/2015      PT End of Session - 07/28/15 0941    Visit Number 14   Number of Visits 23   Date for PT Re-Evaluation 08/14/15   Authorization Type Medicare/BCBS Supplemental (G-code done 10th session)   Authorization Time Period 06/21/15 to 08/19/15   Authorization - Visit Number 14   Authorization - Number of Visits 20   PT Start Time 0933   PT Stop Time 1020   PT Time Calculation (min) 47 min   Activity Tolerance Patient tolerated treatment well   Behavior During Therapy [facility] for tasks assessed/performed      Past Medical History  Diagnosis Date  . Diastolic dysfunction   . Hypertension   . Asthma   . GERD (gastroesophageal reflux disease)     Past Surgical History  Procedure Laterality Date  . Cholecystectomy  10/10  . Knee arthroscopy with medial menisectomy Left 06/16/2015    Procedure: KNEE ARTHROSCOPY WITH PARTIAL MEDIAL MENISECTOMY;  Surgeon: Carole Civil, MD;  Location: [facility];  Service: Orthopedics;  Laterality: Left;    There were no vitals filed for this visit.  Visit Diagnosis:  Knee stiffness, left  Left knee pain  Difficulty walking  S/P left knee arthroscopy  Localized edema      Subjective Assessment - 07/28/15 0939    Subjective Pt stated she has been limping the past 2 days, stated he heard her knee pop last night and woke up to no pain this morning with improved gait mechanics and no pain.   Pertinent History Patient reports that her knee pain has gone on for awhile; she saw Dr. [name] the fall of 2016, and was told that she had arthritis and a possible meniscus tear. Knee just kept progressing and she began having constant pain late  last year. Had arthoscopy performed on Friday the 20th.    Patient Stated Goals get range and strength, get back to yoga    Currently in Pain? No/denies           [facility] Adult PT Treatment/Exercise - 07/28/15 0001    Knee/Hip Exercises: Stretches   Active Hamstring Stretch Both;3 reps;30 seconds   Active Hamstring Stretch Limitations 12 inch box    Gastroc Stretch Both;3 reps;30 seconds   Gastroc Stretch Limitations slant board   Knee/Hip Exercises: Standing   Forward Lunges Both;15 reps   Forward Lunges Limitations 4in step   Lateral Step Up Left;15 reps;Hand Hold: 2;Step Height: 4"   Forward Step Up Left;15 reps;Hand Hold: 1;Step Height: 6"   Functional Squat 1 set;15 reps   SLS Rt 12", Lt 6" max of 3   Knee/Hip Exercises: Seated   Sit to Sand 10 reps;without UE support  cueing for eccentric control   Knee/Hip Exercises: Sidelying   Hip ABduction 15 reps             PT Short Term Goals - 07/17/15 1003    PT SHORT TERM GOAL #1   Title Patient to demonstrate ROM 0-120 degrees L knee in order to improve overall mechanics and enhance mobiltiy    Baseline 2/20- 8-132; patient reports she had never been able to get L knee completely straight even before surgery  Time 3   Period Weeks   Status Partially Met   PT SHORT TERM GOAL #2   Title Patient to be independent in at least 3 edema/pain management techniques, including but not limited to icing, exercise/stretching, retro-grade massage in roder to increase independence in managing condition    Baseline 2/20- has been doing some but not a lot    Time 3   Period Weeks   Status On-going   PT SHORT TERM GOAL #3   Title Paitent to be able to ambulate unlimited distances with no assistive device, improved gait mechanics including equal stance/step lengths/times, improved gait speed, and full L knee ROM in order to enhance mobilty    Baseline 2/20- patient is still taking cane with her but does not use it a lot; however does  have a bit of trouble with mechanics the further she walks    Time 3   Period Weeks   Status On-going   PT SHORT TERM GOAL #4   Title Patient to be indpendent in correctly and consistently performing appropriate HEP, to be updated PRN    Baseline 2/20- doing everything throughout the day    Time 3   Period Weeks   Status Achieved           PT Long Term Goals - 07/17/15 1008    PT LONG TERM GOAL #1   Title Patient to demonstrate strength 5/5 in all tested muscle groups in order to reduce pain, enhance regional stability, and improve gait    Time 6   Period Weeks   Status On-going   PT LONG TERM GOAL #2   Title Patient to report she has been able to return to yoga with L knee pain no more than 2/10 and with no ROM based limiations that prevent her from particicpating in program in order to assist in returning to PLOF    Baseline 2/20- has not tried yet but reports she may get private lessons to start off    Time 6   Period Weeks   Status On-going   PT LONG TERM GOAL #3   Title Patient to be able to ascend/descend full flight of stairs with reciprocal pattern, no railings, minimal unsteadiness, good eccentric control in order to improve overall mobilty at home and in community    Baseline 2/20- stairs is still a major difficulty for patient    Time 6   Period Weeks   Status On-going   PT LONG TERM GOAL #4   Title Patient to be able to perform TUG in 8 seconds and 5x sit to stand in 12 seconds or less in order to demonstrate improved balance and muscle power for more independence in mobiltyi    Baseline 2/20- TUG 12 seconds and 5x sit to stand in 11.23 seconds    Time 6   Period Weeks   Status Partially Met               Plan - 07/28/15 1008    Clinical Impression Statement Session focus on knee mobility to improve AROM and functional strengthening  AROM improving 2-130 degrees today following stretches.  Pt able to demonstrate appropriate form and technique with all  exercises with minimal cueing to avoid compensation due to weakness.  Resumed hip abduction exercises for hip strengthening with cueing for form.  End of session no reports of increased pain, did state knee was tired.   PT Next Visit Plan Continue with current PT POC.  Begin  lateral lunges and sidestepping for glut med strenghtening.  Continue to progress weight bearing activities with standing TKE, lunges and stair training.          Problem List Patient Active Problem List   Diagnosis Date Noted  . Medial meniscus, posterior horn derangement   . Arthritis of knee   . Primary osteoarthritis of left knee 04/05/2014  . Diastolic dysfunction    Ihor Austin, LPTA; CBIS [phone]  Aldona Lento 07/28/2015, 11:13 AM  [place], [place], [address] Phone: [phone]   Fax:  [phone]  Name: Laura Carter MRN: [id number] Date of Birth: [birth year]

## 2015-07-31 ENCOUNTER — Ambulatory Visit (HOSPITAL_COMMUNITY): Payer: Medicare Other

## 2015-07-31 DIAGNOSIS — Z9889 Other specified postprocedural states: Secondary | ICD-10-CM | POA: Diagnosis not present

## 2015-07-31 DIAGNOSIS — M25662 Stiffness of left knee, not elsewhere classified: Secondary | ICD-10-CM

## 2015-07-31 DIAGNOSIS — R600 Localized edema: Secondary | ICD-10-CM | POA: Diagnosis not present

## 2015-07-31 DIAGNOSIS — M25562 Pain in left knee: Secondary | ICD-10-CM | POA: Diagnosis not present

## 2015-07-31 DIAGNOSIS — R262 Difficulty in walking, not elsewhere classified: Secondary | ICD-10-CM

## 2015-07-31 NOTE — Therapy (Signed)
[place], [place], [address] Phone: [phone]   Fax:  [phone]  Physical Therapy Treatment  Patient Details  Name: Laura Carter MRN: [id number] Date of Birth: 1941/02/13 Referring Provider: Dr. Arther Abbott   Encounter Date: 07/31/2015      PT End of Session - 07/31/15 0937    Visit Number 15   Number of Visits 23   Date for PT Re-Evaluation 08/14/15   Authorization Type Medicare/BCBS Supplemental (G-code done 10th session)   Authorization Time Period 06/21/15 to 08/19/15   Authorization - Visit Number 15   Authorization - Number of Visits 20   PT Start Time 0931   PT Stop Time 1015   PT Time Calculation (min) 44 min   Activity Tolerance Patient tolerated treatment well   Behavior During Therapy [facility] for tasks assessed/performed      Past Medical History  Diagnosis Date  . Diastolic dysfunction   . Hypertension   . Asthma   . GERD (gastroesophageal reflux disease)     Past Surgical History  Procedure Laterality Date  . Cholecystectomy  10/10  . Knee arthroscopy with medial menisectomy Left 06/16/2015    Procedure: KNEE ARTHROSCOPY WITH PARTIAL MEDIAL MENISECTOMY;  Surgeon: Carole Civil, MD;  Location: [facility];  Service: Orthopedics;  Laterality: Left;    There were no vitals filed for this visit.  Visit Diagnosis:  Knee stiffness, left  Left knee pain  Localized edema  S/P left knee arthroscopy  Difficulty walking      Subjective Assessment - 07/31/15 0933    Subjective Pt reported that her L knee "is doing great" with occasional c/o minimal soreness of the L knee. Pt denied L knee pain upon arrival. Overall, the pt is happy with her progress thus far.    Pertinent History Patient reports that her knee pain has gone on for awhile; she saw Dr. [name] the fall of 2016, and was told that she had arthritis and a possible meniscus tear. Knee just kept progressing and she began having  constant pain late last year. Had arthoscopy performed on Friday the 20th.    Limitations Walking   Currently in Pain? No/denies   Pain Score 0-No pain  L knee pain has ranged between a 0-1/10 on a VAS since last PT visit.    Pain Location Knee   Pain Orientation Left                         [facility] Adult PT Treatment/Exercise - 07/31/15 0001    Knee/Hip Exercises: Stretches   Active Hamstring Stretch Both;3 reps;30 seconds   Active Hamstring Stretch Limitations 12 inch box    Quad Stretch 3 reps;30 seconds;Left   Gastroc Stretch Both;3 reps;30 seconds   Gastroc Stretch Limitations slant board   Knee/Hip Exercises: Standing   Forward Lunges Both;15 reps   Forward Lunges Limitations 4in step   Lateral Step Up Left;15 reps;Hand Hold: 2;Step Height: 4"   Forward Step Up Left;15 reps;Hand Hold: 1;Step Height: 6"   Functional Squat 2 sets;10 reps   SLS Rt 12", Lt 6" max of 3   Other Standing Knee Exercises side steps with red thera-band x 8 steps, bilaterally, x 3 sets with B UE support                 PT Education - 07/31/15 0936    Education provided Yes   Education Details  Educated pt on current HEP    Person(s) Educated Patient   Methods Explanation   Comprehension Verbalized understanding          PT Short Term Goals - 07/17/15 1003    PT SHORT TERM GOAL #1   Title Patient to demonstrate ROM 0-120 degrees L knee in order to improve overall mechanics and enhance mobiltiy    Baseline 2/20- 8-132; patient reports she had never been able to get L knee completely straight even before surgery    Time 3   Period Weeks   Status Partially Met   PT SHORT TERM GOAL #2   Title Patient to be independent in at least 3 edema/pain management techniques, including but not limited to icing, exercise/stretching, retro-grade massage in roder to increase independence in managing condition    Baseline 2/20- has been doing some but not a lot    Time 3   Period Weeks    Status On-going   PT SHORT TERM GOAL #3   Title Paitent to be able to ambulate unlimited distances with no assistive device, improved gait mechanics including equal stance/step lengths/times, improved gait speed, and full L knee ROM in order to enhance mobilty    Baseline 2/20- patient is still taking cane with her but does not use it a lot; however does have a bit of trouble with mechanics the further she walks    Time 3   Period Weeks   Status On-going   PT SHORT TERM GOAL #4   Title Patient to be indpendent in correctly and consistently performing appropriate HEP, to be updated PRN    Baseline 2/20- doing everything throughout the day    Time 3   Period Weeks   Status Achieved           PT Long Term Goals - 07/17/15 1008    PT LONG TERM GOAL #1   Title Patient to demonstrate strength 5/5 in all tested muscle groups in order to reduce pain, enhance regional stability, and improve gait    Time 6   Period Weeks   Status On-going   PT LONG TERM GOAL #2   Title Patient to report she has been able to return to yoga with L knee pain no more than 2/10 and with no ROM based limiations that prevent her from particicpating in program in order to assist in returning to PLOF    Baseline 2/20- has not tried yet but reports she may get private lessons to start off    Time 6   Period Weeks   Status On-going   PT LONG TERM GOAL #3   Title Patient to be able to ascend/descend full flight of stairs with reciprocal pattern, no railings, minimal unsteadiness, good eccentric control in order to improve overall mobilty at home and in community    Baseline 2/20- stairs is still a major difficulty for patient    Time 6   Period Weeks   Status On-going   PT LONG TERM GOAL #4   Title Patient to be able to perform TUG in 8 seconds and 5x sit to stand in 12 seconds or less in order to demonstrate improved balance and muscle power for more independence in mobiltyi    Baseline 2/20- TUG 12 seconds and  5x sit to stand in 11.23 seconds    Time 6   Period Weeks   Status Partially Met  Plan - 07/31/15 3419    Clinical Impression Statement Added and progressed glute med strengthening with side steps with use of red thera-band. Good tolerance reported with added side steps with no issues reported. Pt continues to c/o difficulty and occasional increase in L knee soreness with anterior and lateral step-ups, but was able to tolerate 15 reps this visit. Verbal and visual cues required for proper L knee alignment during forward lunges on 4" step. Improved technique and performance demo once cues provided. L knee AROM was measured 3-126 degrees at the end of PT tx. Pt is making steady progress towards stated PT goals with less severe L knee pain and improved activity tolerance with community ambulation. Continue with current POC.   Pt will benefit from skilled therapeutic intervention in order to improve on the following deficits Abnormal gait;Hypomobility;Decreased scar mobility;Decreased activity tolerance;Decreased strength;Pain;Decreased balance;Decreased range of motion;Postural dysfunction   Rehab Potential Excellent   Clinical Impairments Affecting Rehab Potential respiratory limitations- chronic bronchitis and asthma    PT Frequency 3x / week   PT Duration 4 weeks   PT Treatment/Interventions ADLs/Self Care Home Management;Cryotherapy;Gait training;DME Instruction;Stair training;Functional mobility training;Therapeutic activities;Therapeutic exercise;Balance training;Neuromuscular re-education;Patient/family education;Manual techniques;Scar mobilization   PT Next Visit Plan Continue with current PT POC.  Add  lateral lunges for glut med strenghtening.  Continue to progress weight bearing activities with standing TKE, lunges and stair training.     PT Home Exercise Plan No change to HEP made.    Consulted and Agree with Plan of Care Patient        Problem List Patient Active  Problem List   Diagnosis Date Noted  . Medial meniscus, posterior horn derangement   . Arthritis of knee   . Primary osteoarthritis of left knee 04/05/2014  . Diastolic dysfunction     Garen Lah, PT, DPT   07/31/2015, 10:16 AM  [place], [place], [address] Phone: [phone]   Fax:  [phone]  Name: Laura Carter MRN: [id number] Date of Birth: 1940-10-06

## 2015-08-01 ENCOUNTER — Encounter: Payer: Self-pay | Admitting: *Deleted

## 2015-08-02 ENCOUNTER — Ambulatory Visit (HOSPITAL_COMMUNITY): Payer: Medicare Other

## 2015-08-02 DIAGNOSIS — R600 Localized edema: Secondary | ICD-10-CM | POA: Diagnosis not present

## 2015-08-02 DIAGNOSIS — Z9889 Other specified postprocedural states: Secondary | ICD-10-CM

## 2015-08-02 DIAGNOSIS — M25562 Pain in left knee: Secondary | ICD-10-CM | POA: Diagnosis not present

## 2015-08-02 DIAGNOSIS — R262 Difficulty in walking, not elsewhere classified: Secondary | ICD-10-CM | POA: Diagnosis not present

## 2015-08-02 DIAGNOSIS — M25662 Stiffness of left knee, not elsewhere classified: Secondary | ICD-10-CM

## 2015-08-02 NOTE — Therapy (Signed)
[place], [place], [address] Phone: [phone]   Fax:  [phone]  Physical Therapy Treatment  Patient Details  Name: Laura Carter MRN: [id number] Date of Birth: May 10, 1941 Referring Provider: Dr. Arther Abbott   Encounter Date: 08/02/2015      PT End of Session - 08/02/15 0946    Visit Number 16   Number of Visits 23   Date for PT Re-Evaluation 08/14/15   Authorization Type Medicare/BCBS Supplemental (G-code done 10th session)   Authorization Time Period 06/21/15 to 08/19/15   Authorization - Visit Number 16   Authorization - Number of Visits 20   PT Start Time 0932   PT Stop Time 1015   PT Time Calculation (min) 43 min   Equipment Utilized During Treatment Gait belt   Activity Tolerance Patient tolerated treatment well   Behavior During Therapy [facility] for tasks assessed/performed      Past Medical History  Diagnosis Date  . Diastolic dysfunction   . Hypertension   . Asthma   . GERD (gastroesophageal reflux disease)     Past Surgical History  Procedure Laterality Date  . Cholecystectomy  10/10  . Knee arthroscopy with medial menisectomy Left 06/16/2015    Procedure: KNEE ARTHROSCOPY WITH PARTIAL MEDIAL MENISECTOMY;  Surgeon: Carole Civil, MD;  Location: [facility];  Service: Orthopedics;  Laterality: Left;    There were no vitals filed for this visit.  Visit Diagnosis:  Knee stiffness, left  Left knee pain  Localized edema  S/P left knee arthroscopy  Difficulty walking      Subjective Assessment - 08/02/15 0935    Subjective Pt reported minor L quad soreness this morning, which subsided as her morning progressed. L knee pain rated a 0/10 on a VAS upon arrival. Pt reported a 90% improvement since beginning with PT services.    Pertinent History Patient reports that her knee pain has gone on for awhile; she saw Dr. Aline Carter the fall of 2016, and was told that she had arthritis and a  possible meniscus tear. Knee just kept progressing and she began having constant pain late last year. Had arthoscopy performed on Friday the 20th.    Limitations Walking   Currently in Pain? No/denies   Pain Score 0-No pain  L knee pain ranges between a 0-3/10 on a VAS   Pain Location Knee   Pain Orientation Left   Pain Descriptors / Indicators Discomfort   Pain Type Surgical pain   Pain Onset More than a month ago   Pain Frequency Intermittent   Aggravating Factors  long distance ambulation    Pain Relieving Factors seated rest break, change position, massage, and ice   Multiple Pain Sites No                         [facility] Adult PT Treatment/Exercise - 08/02/15 0001    Knee/Hip Exercises: Stretches   Active Hamstring Stretch Both;3 reps;30 seconds   Active Hamstring Stretch Limitations supine with strap    Quad Stretch 3 reps;30 seconds;Left   Gastroc Stretch Both;3 reps;30 seconds   Gastroc Stretch Limitations slant board   Knee/Hip Exercises: Standing   Forward Lunges Both;15 reps   Forward Lunges Limitations 6 in step   Forward Step Up Left;15 reps;Hand Hold: 1;Step Height: 6"   Functional Squat 2 sets;10 reps;Other (comment)  with B UE support    Knee/Hip Exercises: Supine   Heel Slides  Left;1 set;10 reps   Manual Therapy   Manual Therapy Joint mobilization;Soft tissue mobilization;Passive ROM   Manual therapy comments Completed independently from other tx   Joint Mobilization L patella mobs using grade I-IV in supine; L posterior fibular head mob using grade I-III to improve knee extension mobility    Soft tissue mobilization STM and cross friction massage to L VMO in supine position    Passive ROM L knee PROM into flexion and extension x 15 reps                 PT Education - 08/02/15 0944    Education provided Yes   Education Details Educated pt on current HEP    Person(s) Educated Patient   Methods Explanation   Comprehension Verbalized  understanding          PT Short Term Goals - 07/17/15 1003    PT SHORT TERM GOAL #1   Title Patient to demonstrate ROM 0-120 degrees L knee in order to improve overall mechanics and enhance mobiltiy    Baseline 2/20- 8-132; patient reports she had never been able to get L knee completely straight even before surgery    Time 3   Period Weeks   Status Partially Met   PT SHORT TERM GOAL #2   Title Patient to be independent in at least 3 edema/pain management techniques, including but not limited to icing, exercise/stretching, retro-grade massage in roder to increase independence in managing condition    Baseline 2/20- has been doing some but not a lot    Time 3   Period Weeks   Status On-going   PT SHORT TERM GOAL #3   Title Paitent to be able to ambulate unlimited distances with no assistive device, improved gait mechanics including equal stance/step lengths/times, improved gait speed, and full L knee ROM in order to enhance mobilty    Baseline 2/20- patient is still taking cane with her but does not use it a lot; however does have a bit of trouble with mechanics the further she walks    Time 3   Period Weeks   Status On-going   PT SHORT TERM GOAL #4   Title Patient to be indpendent in correctly and consistently performing appropriate HEP, to be updated PRN    Baseline 2/20- doing everything throughout the day    Time 3   Period Weeks   Status Achieved           PT Long Term Goals - 07/17/15 1008    PT LONG TERM GOAL #1   Title Patient to demonstrate strength 5/5 in all tested muscle groups in order to reduce pain, enhance regional stability, and improve gait    Time 6   Period Weeks   Status On-going   PT LONG TERM GOAL #2   Title Patient to report she has been able to return to yoga with L knee pain no more than 2/10 and with no ROM based limiations that prevent her from particicpating in program in order to assist in returning to PLOF    Baseline 2/20- has not tried yet  but reports she may get private lessons to start off    Time 6   Period Weeks   Status On-going   PT LONG TERM GOAL #3   Title Patient to be able to ascend/descend full flight of stairs with reciprocal pattern, no railings, minimal unsteadiness, good eccentric control in order to improve overall mobilty at home and in community  Baseline 2/20- stairs is still a major difficulty for patient    Time 6   Period Weeks   Status On-going   PT LONG TERM GOAL #4   Title Patient to be able to perform TUG in 8 seconds and 5x sit to stand in 12 seconds or less in order to demonstrate improved balance and muscle power for more independence in mobiltyi    Baseline 2/20- TUG 12 seconds and 5x sit to stand in 11.23 seconds    Time 6   Period Weeks   Status Partially Met               Plan - 08/02/15 7373    Clinical Impression Statement PT tx focused on L knee joint mobs and STM to improve L knee extension AROM and reduce muscle tenderness, respectively, and L quad/ HS strengthening ther ex. Good tolerance reported with added posterior head fibular mobs with improved L knee ROM measured to 2-135 degrees. Pt remained pain-free and reported improved tolerance with CKC strengthening ther ex this visit. Verbal and visual cues required to improve L knee alignment and technique with forward lunges. L knee pain remained at a 0/10 on a VAS at the completion of today's visit. Pt continues to make steady progress towards stated goals with less severe L knee pain and improved L knee ROM. She continues to present with slight deficits into L TKE and would benefit from continued skilled PT to improve TKE during gait. Continue per POC with focus on L knee ROM ther ex to improve extension, manual techniques to improve L knee joint mobility/ROM, and quad/HS/ hip abductor strengthening.    Pt will benefit from skilled therapeutic intervention in order to improve on the following deficits Abnormal  gait;Hypomobility;Decreased scar mobility;Decreased activity tolerance;Decreased strength;Pain;Decreased balance;Decreased range of motion;Postural dysfunction   Rehab Potential Excellent   Clinical Impairments Affecting Rehab Potential respiratory limitations- chronic bronchitis and asthma    PT Frequency 3x / week   PT Duration 4 weeks   PT Treatment/Interventions ADLs/Self Care Home Management;Cryotherapy;Gait training;DME Instruction;Stair training;Functional mobility training;Therapeutic activities;Therapeutic exercise;Balance training;Neuromuscular re-education;Patient/family education;Manual techniques;Scar mobilization   PT Next Visit Plan Continue with current PT POC.  Add  lateral lunges for glut med strenghtening.  Continue to progress weight bearing activities with standing TKE, lunges and stair training.     PT Home Exercise Plan No change to HEP made.    Consulted and Agree with Plan of Care Patient        Problem List Patient Active Problem List   Diagnosis Date Noted  . Medial meniscus, posterior horn derangement   . Arthritis of knee   . Primary osteoarthritis of left knee 04/05/2014  . Diastolic dysfunction     Garen Lah, PT, DPT  08/02/2015, 12:30 PM  [place], [place], [address] Phone: [phone]   Fax:  [phone]  Name: DELLA SCRIVENER MRN: [id number] Date of Birth: 1940/06/21

## 2015-08-04 ENCOUNTER — Ambulatory Visit (HOSPITAL_COMMUNITY): Payer: Medicare Other

## 2015-08-04 DIAGNOSIS — M25662 Stiffness of left knee, not elsewhere classified: Secondary | ICD-10-CM | POA: Diagnosis not present

## 2015-08-04 DIAGNOSIS — R600 Localized edema: Secondary | ICD-10-CM | POA: Diagnosis not present

## 2015-08-04 DIAGNOSIS — M25562 Pain in left knee: Secondary | ICD-10-CM | POA: Diagnosis not present

## 2015-08-04 DIAGNOSIS — Z9889 Other specified postprocedural states: Secondary | ICD-10-CM

## 2015-08-04 DIAGNOSIS — R262 Difficulty in walking, not elsewhere classified: Secondary | ICD-10-CM

## 2015-08-04 NOTE — Therapy (Signed)
[place], [place], [address] Phone: [phone]   Fax:  [phone]  Physical Therapy Treatment  Patient Details  Name: Laura Carter MRN: [id number] Date of Birth: 01-25-1941 Referring Provider: Dr. Arther Abbott   Encounter Date: 08/04/2015      PT End of Session - 08/04/15 0945    Visit Number 17   Number of Visits 23   Date for PT Re-Evaluation 08/14/15   Authorization Type Medicare/BCBS Supplemental (G-code done 10th session)   Authorization Time Period 06/21/15 to 08/19/15   Authorization - Visit Number 36   Authorization - Number of Visits 20   PT Start Time 0934   PT Stop Time 1020   PT Time Calculation (min) 46 min   Activity Tolerance Patient tolerated treatment well   Behavior During Therapy [facility] for tasks assessed/performed      Past Medical History  Diagnosis Date  . Diastolic dysfunction   . Hypertension   . Asthma   . GERD (gastroesophageal reflux disease)     Past Surgical History  Procedure Laterality Date  . Cholecystectomy  10/10  . Knee arthroscopy with medial menisectomy Left 06/16/2015    Procedure: KNEE ARTHROSCOPY WITH PARTIAL MEDIAL MENISECTOMY;  Surgeon: Carole Civil, MD;  Location: [facility];  Service: Orthopedics;  Laterality: Left;    There were no vitals filed for this visit.  Visit Diagnosis:  Knee stiffness, left  Left knee pain  Localized edema  S/P left knee arthroscopy  Difficulty walking      Subjective Assessment - 08/04/15 0938    Subjective Pt stated she did a lot of walking yesterday, currently pain free though does continue to c/o discomfort with certain movements during gait on medial distal patella region with "crunching" during knee flexion.  Stated most difficulty currentlty with stairs and balance   Pertinent History Patient reports that her knee pain has gone on for awhile; she saw Dr. [name] the fall of 2016, and was told that she had  arthritis and a possible meniscus tear. Knee just kept progressing and she began having constant pain late last year. Had arthoscopy performed on Friday the 20th.    Patient Stated Goals get range and strength, get back to yoga    Currently in Pain? No/denies   Pain Type Surgical pain   Pain Onset More than a month ago   Pain Frequency Intermittent   Aggravating Factors  long distance ambulation   Pain Relieving Factors seated rest break, change position, massage, and ice   Multiple Pain Sites No            [facility] Adult PT Treatment/Exercise - 08/04/15 0001    Knee/Hip Exercises: Stretches   Active Hamstring Stretch Both;3 reps;30 seconds   Active Hamstring Stretch Limitations supine with rope   Knee/Hip Exercises: Standing   Forward Lunges Both;15 reps   Forward Lunges Limitations 6 in step   Side Lunges Both;10 reps   Side Lunges Limitations 6in step   Terminal Knee Extension Limitations TKE 15x 5" with RTB   Lateral Step Up Left;15 reps;Hand Hold: 2;Step Height: 4"   Forward Step Up Left;15 reps;Hand Hold: 1;Step Height: 6"   Manual Therapy   Manual Therapy Joint mobilization;Soft tissue mobilization;Passive ROM   Manual therapy comments Completed independently from other tx   Joint Mobilization L patella mobs using grade I-IV in supine; L posterior fibular head mob using grade I-III to improve knee extension mobility  Soft tissue mobilization STM and cross friction massage to L VMO in supine position    Passive ROM Lt knee PROM for extension                  PT Short Term Goals - 07/17/15 1003    PT SHORT TERM GOAL #1   Title Patient to demonstrate ROM 0-120 degrees L knee in order to improve overall mechanics and enhance mobiltiy    Baseline 2/20- 8-132; patient reports she had never been able to get L knee completely straight even before surgery    Time 3   Period Weeks   Status Partially Met   PT SHORT TERM GOAL #2   Title Patient to be independent in at  least 3 edema/pain management techniques, including but not limited to icing, exercise/stretching, retro-grade massage in roder to increase independence in managing condition    Baseline 2/20- has been doing some but not a lot    Time 3   Period Weeks   Status On-going   PT SHORT TERM GOAL #3   Title Paitent to be able to ambulate unlimited distances with no assistive device, improved gait mechanics including equal stance/step lengths/times, improved gait speed, and full L knee ROM in order to enhance mobilty    Baseline 2/20- patient is still taking cane with her but does not use it a lot; however does have a bit of trouble with mechanics the further she walks    Time 3   Period Weeks   Status On-going   PT SHORT TERM GOAL #4   Title Patient to be indpendent in correctly and consistently performing appropriate HEP, to be updated PRN    Baseline 2/20- doing everything throughout the day    Time 3   Period Weeks   Status Achieved           PT Long Term Goals - 07/17/15 1008    PT LONG TERM GOAL #1   Title Patient to demonstrate strength 5/5 in all tested muscle groups in order to reduce pain, enhance regional stability, and improve gait    Time 6   Period Weeks   Status On-going   PT LONG TERM GOAL #2   Title Patient to report she has been able to return to yoga with L knee pain no more than 2/10 and with no ROM based limiations that prevent her from particicpating in program in order to assist in returning to PLOF    Baseline 2/20- has not tried yet but reports she may get private lessons to start off    Time 6   Period Weeks   Status On-going   PT LONG TERM GOAL #3   Title Patient to be able to ascend/descend full flight of stairs with reciprocal pattern, no railings, minimal unsteadiness, good eccentric control in order to improve overall mobilty at home and in community    Baseline 2/20- stairs is still a major difficulty for patient    Time 6   Period Weeks   Status  On-going   PT LONG TERM GOAL #4   Title Patient to be able to perform TUG in 8 seconds and 5x sit to stand in 12 seconds or less in order to demonstrate improved balance and muscle power for more independence in mobiltyi    Baseline 2/20- TUG 12 seconds and 5x sit to stand in 11.23 seconds    Time 6   Period Weeks   Status Partially Met  Plan - 08/04/15 1236    Clinical Impression Statement Continued session focus on improving quad strengthening to improve knee extesnion with gait mechanics and functional strengthening.  Manual joint mobs and soft tissue mobilization complete to readuce quad tenderness proximal knee and to reduce tightness at distal hamsttring to assist with knee extension.  Resumed standing TKE and pt given theraband to add to HEP.  Added lateral lunges for glut med strenghtening and continued stair training for functioanl strengthening.  Therapist faciilitation to improve mechanics with stair training wtih reports of increased ease with activity.  Pt continues to exhibit impairments with SLS balance activities, will continue to benefit from skillled intervention to improve ROM, strengthening and balance training to reduce risk of fall.  End of session pt reports tired, no pain throguh session.     Pt will benefit from skilled therapeutic intervention in order to improve on the following deficits Abnormal gait;Hypomobility;Decreased scar mobility;Decreased activity tolerance;Decreased strength;Pain;Decreased balance;Decreased range of motion;Postural dysfunction   Rehab Potential Excellent   Clinical Impairments Affecting Rehab Potential respiratory limitations- chronic bronchitis and asthma    PT Duration 4 weeks   PT Treatment/Interventions ADLs/Self Care Home Management;Cryotherapy;Gait training;DME Instruction;Stair training;Functional mobility training;Therapeutic activities;Therapeutic exercise;Balance training;Neuromuscular re-education;Patient/family  education;Manual techniques;Scar mobilization   PT Next Visit Plan Continue with current PT POC to improve knee extension, functional strenghtening with stair training and lunges and balance        Problem List Patient Active Problem List   Diagnosis Date Noted  . Medial meniscus, posterior horn derangement   . Arthritis of knee   . Primary osteoarthritis of left knee 04/05/2014  . Diastolic dysfunction    Ihor Austin, LPTA; CBIS [phone]  Aldona Lento 08/04/2015, 12:45 PM  [place], [place], [address] Phone: [phone]   Fax:  [phone]  Name: Laura Carter MRN: [id number] Date of Birth: 1941-04-28

## 2015-08-07 ENCOUNTER — Ambulatory Visit (HOSPITAL_COMMUNITY): Payer: Medicare Other | Admitting: Physical Therapy

## 2015-08-07 DIAGNOSIS — R600 Localized edema: Secondary | ICD-10-CM

## 2015-08-07 DIAGNOSIS — Z9889 Other specified postprocedural states: Secondary | ICD-10-CM | POA: Diagnosis not present

## 2015-08-07 DIAGNOSIS — R262 Difficulty in walking, not elsewhere classified: Secondary | ICD-10-CM | POA: Diagnosis not present

## 2015-08-07 DIAGNOSIS — M25562 Pain in left knee: Secondary | ICD-10-CM

## 2015-08-07 DIAGNOSIS — M25662 Stiffness of left knee, not elsewhere classified: Secondary | ICD-10-CM | POA: Diagnosis not present

## 2015-08-07 NOTE — Therapy (Signed)
[place], [place], [address] Phone: [phone]   Fax:  [phone]  Physical Therapy Treatment  Patient Details  Name: Laura Carter MRN: [id number] Date of Birth: 07-09-1940 Referring Provider: Dr. Arther Abbott   Encounter Date: 08/07/2015      PT End of Session - 08/07/15 1014    Visit Number 18   Number of Visits 23   Date for PT Re-Evaluation 08/14/15   Authorization Type Medicare/BCBS Supplemental (G-code done 10th session)   Authorization Time Period 06/21/15 to 08/19/15   Authorization - Visit Number 18   Authorization - Number of Visits 20   PT Start Time 0940   PT Stop Time 1020   PT Time Calculation (min) 40 min   Activity Tolerance Patient tolerated treatment well      Past Medical History  Diagnosis Date  . Diastolic dysfunction   . Hypertension   . Asthma   . GERD (gastroesophageal reflux disease)     Past Surgical History  Procedure Laterality Date  . Cholecystectomy  10/10  . Knee arthroscopy with medial menisectomy Left 06/16/2015    Procedure: KNEE ARTHROSCOPY WITH PARTIAL MEDIAL MENISECTOMY;  Surgeon: Carole Civil, MD;  Location: [facility];  Service: Orthopedics;  Laterality: Left;    There were no vitals filed for this visit.  Visit Diagnosis:  Knee stiffness, left  Left knee pain  Localized edema  S/P left knee arthroscopy  Difficulty walking      Subjective Assessment - 08/07/15 0940    Subjective Pt states that yesterday she went to a baby shower and had a long decline that she had to go down which bothered her quite a bit.  She is going to try flexogenic and has almost  decided that she will need a total knee.     Pertinent History Patient reports that her knee pain has gone on for awhile; she saw Dr. [name] the fall of 2016, and was told that she had arthritis and a possible meniscus tear. Knee just kept progressing and she began having constant pain late last  year. Had arthoscopy performed on Friday the 20th.    Currently in Pain? No/denies                         [facility] Adult PT Treatment/Exercise - 08/07/15 0001    Exercises   Exercises Knee/Hip   Knee/Hip Exercises: Stretches   Active Hamstring Stretch Left;3 reps;30 seconds   Gastroc Stretch Both;3 reps;30 seconds   Gastroc Stretch Limitations slant board   Knee/Hip Exercises: Standing   Heel Raises Both;10 reps   Knee Flexion Strengthening;Left;10 reps   Knee Flexion Limitations 3#   Terminal Knee Extension Limitations TKE 15x 5" with manual    Lateral Step Up --   Forward Step Up Left;10 reps;Hand Hold: 1;Step Height: 6"   Functional Squat 10 reps   Rocker Board 2 minutes   Other Standing Knee Exercises tandem stance  2 x 30" each    Knee/Hip Exercises: Seated   Sit to Sand 10 reps;without UE support  cueing for eccentric control                  PT Short Term Goals - 07/17/15 1003    PT SHORT TERM GOAL #1   Title Patient to demonstrate ROM 0-120 degrees L knee in order to improve overall mechanics and enhance mobiltiy    Baseline 2/20-  8-132; patient reports she had never been able to get L knee completely straight even before surgery    Time 3   Period Weeks   Status Partially Met   PT SHORT TERM GOAL #2   Title Patient to be independent in at least 3 edema/pain management techniques, including but not limited to icing, exercise/stretching, retro-grade massage in roder to increase independence in managing condition    Baseline 2/20- has been doing some but not a lot    Time 3   Period Weeks   Status On-going   PT SHORT TERM GOAL #3   Title Paitent to be able to ambulate unlimited distances with no assistive device, improved gait mechanics including equal stance/step lengths/times, improved gait speed, and full L knee ROM in order to enhance mobilty    Baseline 2/20- patient is still taking cane with her but does not use it a lot; however does  have a bit of trouble with mechanics the further she walks    Time 3   Period Weeks   Status On-going   PT SHORT TERM GOAL #4   Title Patient to be indpendent in correctly and consistently performing appropriate HEP, to be updated PRN    Baseline 2/20- doing everything throughout the day    Time 3   Period Weeks   Status Achieved           PT Long Term Goals - 07/17/15 1008    PT LONG TERM GOAL #1   Title Patient to demonstrate strength 5/5 in all tested muscle groups in order to reduce pain, enhance regional stability, and improve gait    Time 6   Period Weeks   Status On-going   PT LONG TERM GOAL #2   Title Patient to report she has been able to return to yoga with L knee pain no more than 2/10 and with no ROM based limiations that prevent her from particicpating in program in order to assist in returning to PLOF    Baseline 2/20- has not tried yet but reports she may get private lessons to start off    Time 6   Period Weeks   Status On-going   PT LONG TERM GOAL #3   Title Patient to be able to ascend/descend full flight of stairs with reciprocal pattern, no railings, minimal unsteadiness, good eccentric control in order to improve overall mobilty at home and in community    Baseline 2/20- stairs is still a major difficulty for patient    Time 6   Period Weeks   Status On-going   PT LONG TERM GOAL #4   Title Patient to be able to perform TUG in 8 seconds and 5x sit to stand in 12 seconds or less in order to demonstrate improved balance and muscle power for more independence in mobiltyi    Baseline 2/20- TUG 12 seconds and 5x sit to stand in 11.23 seconds    Time 6   Period Weeks   Status Partially Met               Plan - 08/07/15 1148    Clinical Impression Statement Pt main limitation at this time appears to be balance.  Therapist began tandem stance as well as decreasing UE hold with exercises to focus on balance.  All exercises were completed with therapist  faciliation for proper technique and safety.    Pt will benefit from skilled therapeutic intervention in order to improve on the following deficits Abnormal  gait;Hypomobility;Decreased scar mobility;Decreased activity tolerance;Decreased strength;Pain;Decreased balance;Decreased range of motion;Postural dysfunction   Clinical Impairments Affecting Rehab Potential respiratory limitations- chronic bronchitis and asthma    PT Duration 4 weeks   PT Treatment/Interventions ADLs/Self Care Home Management;Cryotherapy;Gait training;DME Instruction;Stair training;Functional mobility training;Therapeutic activities;Therapeutic exercise;Balance training;Neuromuscular re-education;Patient/family education;Manual techniques;Scar mobilization   PT Next Visit Plan begin narrow base of support with head turns; eyes closed next treatment as well as stair training.  Work on strengthening eccentric control of quadricep musculature.         Problem List Patient Active Problem List   Diagnosis Date Noted  . Medial meniscus, posterior horn derangement   . Arthritis of knee   . Primary osteoarthritis of left knee 04/05/2014  . Diastolic dysfunction     Rayetta Humphrey, [place] CLT [phone] 08/07/2015, 11:51 AM  [place], [place], [address] Phone: [phone]   Fax:  [phone]  Name: DAPHENE CHISHOLM MRN: [id number] Date of Birth: [birth year]

## 2015-08-08 DIAGNOSIS — D1801 Hemangioma of skin and subcutaneous tissue: Secondary | ICD-10-CM | POA: Diagnosis not present

## 2015-08-08 DIAGNOSIS — L814 Other melanin hyperpigmentation: Secondary | ICD-10-CM | POA: Diagnosis not present

## 2015-08-08 DIAGNOSIS — Z23 Encounter for immunization: Secondary | ICD-10-CM | POA: Diagnosis not present

## 2015-08-08 DIAGNOSIS — L821 Other seborrheic keratosis: Secondary | ICD-10-CM | POA: Diagnosis not present

## 2015-08-08 DIAGNOSIS — D225 Melanocytic nevi of trunk: Secondary | ICD-10-CM | POA: Diagnosis not present

## 2015-08-08 DIAGNOSIS — D485 Neoplasm of uncertain behavior of skin: Secondary | ICD-10-CM | POA: Diagnosis not present

## 2015-08-09 ENCOUNTER — Ambulatory Visit (HOSPITAL_COMMUNITY): Payer: Medicare Other | Admitting: Physical Therapy

## 2015-08-09 DIAGNOSIS — R262 Difficulty in walking, not elsewhere classified: Secondary | ICD-10-CM | POA: Diagnosis not present

## 2015-08-09 DIAGNOSIS — M25662 Stiffness of left knee, not elsewhere classified: Secondary | ICD-10-CM | POA: Diagnosis not present

## 2015-08-09 DIAGNOSIS — L570 Actinic keratosis: Secondary | ICD-10-CM | POA: Diagnosis not present

## 2015-08-09 DIAGNOSIS — R600 Localized edema: Secondary | ICD-10-CM | POA: Diagnosis not present

## 2015-08-09 DIAGNOSIS — M25562 Pain in left knee: Secondary | ICD-10-CM

## 2015-08-09 DIAGNOSIS — Z9889 Other specified postprocedural states: Secondary | ICD-10-CM | POA: Diagnosis not present

## 2015-08-09 NOTE — Therapy (Signed)
[place], [place], [address] Phone: [phone]   Fax:  [phone]  Physical Therapy Treatment  Patient Details  Name: Laura Carter MRN: [id number] Date of Birth: 01/15/1941 Referring Provider: Dr. Arther Abbott   Encounter Date: 08/09/2015      PT End of Session - 08/09/15 1016    Visit Number 19   Number of Visits 23   Date for PT Re-Evaluation 08/14/15   Authorization Type Medicare/BCBS Supplemental (G-code done 19th session)   Authorization Time Period 06/21/15 to 08/19/15   Authorization - Visit Number 69   Authorization - Number of Visits 29   PT Start Time 0932   PT Stop Time 1014   PT Time Calculation (min) 42 min   Activity Tolerance Patient tolerated treatment well   Behavior During Therapy [facility] for tasks assessed/performed      Past Medical History  Diagnosis Date  . Diastolic dysfunction   . Hypertension   . Asthma   . GERD (gastroesophageal reflux disease)     Past Surgical History  Procedure Laterality Date  . Cholecystectomy  10/10  . Knee arthroscopy with medial menisectomy Left 06/16/2015    Procedure: KNEE ARTHROSCOPY WITH PARTIAL MEDIAL MENISECTOMY;  Surgeon: Carole Civil, MD;  Location: [facility];  Service: Orthopedics;  Laterality: Left;    There were no vitals filed for this visit.  Visit Diagnosis:  Knee stiffness, left  Left knee pain  Localized edema  S/P left knee arthroscopy  Difficulty walking      Subjective Assessment - 08/09/15 0935    Subjective Patient reports she has gone through yesterday without her cane, but did have some muscle soreness and catching; she arrived today without her cane. She continues to report that she is going to try flexogenics but is still convinced that she will need a total knee replacement.    Pertinent History Patient reports that her knee pain has gone on for awhile; she saw Dr. [name] the fall of 2016, and was told that  she had arthritis and a possible meniscus tear. Knee just kept progressing and she began having constant pain late last year. Had arthoscopy performed on Friday the 20th.    Currently in Pain? Yes   Pain Score 2    Pain Location Knee   Pain Orientation Left   Pain Descriptors / Indicators Aching   Pain Type Surgical pain   Pain Radiating Towards none    Pain Onset More than a month ago   Pain Frequency Intermittent   Aggravating Factors  lfiting something heavy   Pain Relieving Factors just depends on the leg and the day    Effect of Pain on Daily Activities does not stop her from doing much                          [facility] Adult PT Treatment/Exercise - 08/09/15 0001    Knee/Hip Exercises: Stretches   Active Hamstring Stretch Both;3 reps;30 seconds   Active Hamstring Stretch Limitations 12 inch box    Quad Stretch 3 reps;30 seconds;Left   Gastroc Stretch Both;3 reps;30 seconds   Gastroc Stretch Limitations slant board   Knee/Hip Exercises: Seated   Sit to Sand 15 reps;Other (comment)  eccentric, cues for form    Knee/Hip Exercises: Sidelying   Hip ABduction 15 reps             Balance Exercises - 08/09/15 0950  Balance Exercises: Standing   Standing Eyes Opened Narrow base of support (BOS);3 reps;20 secs;Other (comment)  eyes closed    Tandem Stance 3 reps;20 secs  eyes closed   SLS Eyes open;3 reps;Solid surface           PT Education - 08/09/15 1016    Education provided Yes   Education Details muscle fatigue and soreness    Person(s) Educated Patient   Methods Explanation   Comprehension Verbalized understanding          PT Short Term Goals - 07/17/15 1003    PT SHORT TERM GOAL #1   Title Patient to demonstrate ROM 0-120 degrees L knee in order to improve overall mechanics and enhance mobiltiy    Baseline 2/20- 8-132; patient reports she had never been able to get L knee completely straight even before surgery    Time 3   Period  Weeks   Status Partially Met   PT SHORT TERM GOAL #2   Title Patient to be independent in at least 3 edema/pain management techniques, including but not limited to icing, exercise/stretching, retro-grade massage in roder to increase independence in managing condition    Baseline 2/20- has been doing some but not a lot    Time 3   Period Weeks   Status On-going   PT SHORT TERM GOAL #3   Title Paitent to be able to ambulate unlimited distances with no assistive device, improved gait mechanics including equal stance/step lengths/times, improved gait speed, and full L knee ROM in order to enhance mobilty    Baseline 2/20- patient is still taking cane with her but does not use it a lot; however does have a bit of trouble with mechanics the further she walks    Time 3   Period Weeks   Status On-going   PT SHORT TERM GOAL #4   Title Patient to be indpendent in correctly and consistently performing appropriate HEP, to be updated PRN    Baseline 2/20- doing everything throughout the day    Time 3   Period Weeks   Status Achieved           PT Long Term Goals - 07/17/15 1008    PT LONG TERM GOAL #1   Title Patient to demonstrate strength 5/5 in all tested muscle groups in order to reduce pain, enhance regional stability, and improve gait    Time 6   Period Weeks   Status On-going   PT LONG TERM GOAL #2   Title Patient to report she has been able to return to yoga with L knee pain no more than 2/10 and with no ROM based limiations that prevent her from particicpating in program in order to assist in returning to PLOF    Baseline 2/20- has not tried yet but reports she may get private lessons to start off    Time 6   Period Weeks   Status On-going   PT LONG TERM GOAL #3   Title Patient to be able to ascend/descend full flight of stairs with reciprocal pattern, no railings, minimal unsteadiness, good eccentric control in order to improve overall mobilty at home and in community    Baseline  2/20- stairs is still a major difficulty for patient    Time 6   Period Weeks   Status On-going   PT LONG TERM GOAL #4   Title Patient to be able to perform TUG in 8 seconds and 5x sit to stand in 12  seconds or less in order to demonstrate improved balance and muscle power for more independence in mobiltyi    Baseline 2/20- TUG 12 seconds and 5x sit to stand in 11.23 seconds    Time 6   Period Weeks   Status Partially Met               Plan - 2015/09/02 1005    Clinical Impression Statement Patient arrived walking without her cane today; continued focus on functional stretching, functional balance skills, and functional stretching today. Also resumed functional exercises for hip abductors to assist in improving balance tasks today, otherwise continue to work on eccentric strengthening. Cues for form throughout session.    Pt will benefit from skilled therapeutic intervention in order to improve on the following deficits Abnormal gait;Hypomobility;Decreased scar mobility;Decreased activity tolerance;Decreased strength;Pain;Decreased balance;Decreased range of motion;Postural dysfunction   Rehab Potential Excellent   Clinical Impairments Affecting Rehab Potential respiratory limitations- chronic bronchitis and asthma    PT Frequency 3x / week   PT Duration 4 weeks   PT Treatment/Interventions ADLs/Self Care Home Management;Cryotherapy;Gait training;DME Instruction;Stair training;Functional mobility training;Therapeutic activities;Therapeutic exercise;Balance training;Neuromuscular re-education;Patient/family education;Manual techniques;Scar mobilization   PT Next Visit Plan begin narrow base of support with head turns; eyes closed next treatment as well as stair training. Work hip abductors.    PT Home Exercise Plan No change to HEP made.    Consulted and Agree with Plan of Care Patient          G-Codes - 09/02/15 1028    Functional Assessment Tool Used Based on skilled clnical  assessment of functional strength, balance, gait, general mobiltiy    Functional Limitation Mobility: Walking and moving around   Mobility: Walking and Moving Around Current Status [phone]) At least 20 percent but less than 40 percent impaired, limited or restricted   Mobility: Walking and Moving Around Goal Status [phone]) At least 20 percent but less than 40 percent impaired, limited or restricted      Problem List Patient Active Problem List   Diagnosis Date Noted  . Medial meniscus, posterior horn derangement   . Arthritis of knee   . Primary osteoarthritis of left knee 04/05/2014  . Diastolic dysfunction     Deniece Ree PT, DPT [place] [address] [place], [place], [address] Phone: [phone]   Fax:  [phone]  Name: SARIYA TRICKEY MRN: [id number] Date of Birth: 10-09-1940

## 2015-08-11 ENCOUNTER — Ambulatory Visit (HOSPITAL_COMMUNITY): Payer: Medicare Other

## 2015-08-11 DIAGNOSIS — M25562 Pain in left knee: Secondary | ICD-10-CM | POA: Diagnosis not present

## 2015-08-11 DIAGNOSIS — Z9889 Other specified postprocedural states: Secondary | ICD-10-CM

## 2015-08-11 DIAGNOSIS — R262 Difficulty in walking, not elsewhere classified: Secondary | ICD-10-CM

## 2015-08-11 DIAGNOSIS — R600 Localized edema: Secondary | ICD-10-CM

## 2015-08-11 DIAGNOSIS — M25662 Stiffness of left knee, not elsewhere classified: Secondary | ICD-10-CM

## 2015-08-11 NOTE — Therapy (Signed)
[place], [place], [address] Phone: [phone]   Fax:  [phone]  Physical Therapy Treatment  Patient Details  Name: Laura Carter MRN: [id number] Date of Birth: 02/03/1941 Referring Provider: Dr. Arther Abbott  Encounter Date: 08/11/2015      PT End of Session - 08/11/15 0941    Visit Number 20   Number of Visits 23   Date for PT Re-Evaluation 08/14/15   Authorization Type Medicare/BCBS Supplemental (G-code done 19th session)   Authorization Time Period 06/21/15 to 08/19/15   Authorization - Visit Number 32   Authorization - Number of Visits 29   PT Start Time 0934   PT Stop Time 1016   PT Time Calculation (min) 42 min   Equipment Utilized During Treatment Gait belt   Activity Tolerance Patient tolerated treatment well   Behavior During Therapy [facility] for tasks assessed/performed      Past Medical History  Diagnosis Date  . Diastolic dysfunction   . Hypertension   . Asthma   . GERD (gastroesophageal reflux disease)     Past Surgical History  Procedure Laterality Date  . Cholecystectomy  10/10  . Knee arthroscopy with medial menisectomy Left 06/16/2015    Procedure: KNEE ARTHROSCOPY WITH PARTIAL MEDIAL MENISECTOMY;  Surgeon: Carole Civil, MD;  Location: [facility];  Service: Orthopedics;  Laterality: Left;    There were no vitals filed for this visit.  Visit Diagnosis:  Knee stiffness, left  Left knee pain  Localized edema  S/P left knee arthroscopy  Difficulty walking      Subjective Assessment - 08/11/15 0936    Subjective Pt reports she is pain free today, has been 4 days without using cane and returned to yoga Wednesday with reports of swelling afterwards.  Feels her balance is biggest deficit, has been performing balance activities at home.     Pertinent History Patient reports that her knee pain has gone on for awhile; she saw Dr. [name] the fall of 2016, and was told that she  had arthritis and a possible meniscus tear. Knee just kept progressing and she began having constant pain late last year. Had arthoscopy performed on Friday the 20th.    Patient Stated Goals get range and strength, get back to yoga    Currently in Pain? No/denies            [facility] PT Assessment - 08/11/15 0001    Assessment   Medical Diagnosis s/p L knee arthoscopy    Referring Provider Dr. Arther Abbott   Onset Date/Surgical Date 06/16/15   Next MD Visit 08/18/2015   Precautions   Precautions None             [facility] Adult PT Treatment/Exercise - 08/11/15 0001    Knee/Hip Exercises: Standing   Functional Squat 15 reps   Knee/Hip Exercises: Sidelying   Hip ABduction 15 reps   Hip ABduction Limitations 4#             Balance Exercises - 08/11/15 0944    Balance Exercises: Standing   Standing Eyes Opened Narrow base of support (BOS);Foam/compliant surface  head turns NBOS on airex 2x 10; Standing on airex lean BOS   Standing Eyes Closed Narrow base of support (BOS);Foam/compliant surface;3 reps;30 secs   Tandem Stance Eyes open;Foam/compliant surface;3 reps;30 secs   SLS Eyes open;3 reps;Solid surface;Time;Limitations  Lt 8", Rt 12" max of 3   Sidestepping 2 reps;Theraband  RTB  PT Short Term Goals - 07/17/15 1003    PT SHORT TERM GOAL #1   Title Patient to demonstrate ROM 0-120 degrees L knee in order to improve overall mechanics and enhance mobiltiy    Baseline 2/20- 8-132; patient reports she had never been able to get L knee completely straight even before surgery    Time 3   Period Weeks   Status Partially Met   PT SHORT TERM GOAL #2   Title Patient to be independent in at least 3 edema/pain management techniques, including but not limited to icing, exercise/stretching, retro-grade massage in roder to increase independence in managing condition    Baseline 2/20- has been doing some but not a lot    Time 3   Period Weeks   Status  On-going   PT SHORT TERM GOAL #3   Title Paitent to be able to ambulate unlimited distances with no assistive device, improved gait mechanics including equal stance/step lengths/times, improved gait speed, and full L knee ROM in order to enhance mobilty    Baseline 2/20- patient is still taking cane with her but does not use it a lot; however does have a bit of trouble with mechanics the further she walks    Time 3   Period Weeks   Status On-going   PT SHORT TERM GOAL #4   Title Patient to be indpendent in correctly and consistently performing appropriate HEP, to be updated PRN    Baseline 2/20- doing everything throughout the day    Time 3   Period Weeks   Status Achieved           PT Long Term Goals - 07/17/15 1008    PT LONG TERM GOAL #1   Title Patient to demonstrate strength 5/5 in all tested muscle groups in order to reduce pain, enhance regional stability, and improve gait    Time 6   Period Weeks   Status On-going   PT LONG TERM GOAL #2   Title Patient to report she has been able to return to yoga with L knee pain no more than 2/10 and with no ROM based limiations that prevent her from particicpating in program in order to assist in returning to PLOF    Baseline 2/20- has not tried yet but reports she may get private lessons to start off    Time 6   Period Weeks   Status On-going   PT LONG TERM GOAL #3   Title Patient to be able to ascend/descend full flight of stairs with reciprocal pattern, no railings, minimal unsteadiness, good eccentric control in order to improve overall mobilty at home and in community    Baseline 2/20- stairs is still a major difficulty for patient    Time 6   Period Weeks   Status On-going   PT LONG TERM GOAL #4   Title Patient to be able to perform TUG in 8 seconds and 5x sit to stand in 12 seconds or less in order to demonstrate improved balance and muscle power for more independence in mobiltyi    Baseline 2/20- TUG 12 seconds and 5x sit to  stand in 11.23 seconds    Time 6   Period Weeks   Status Partially Met               Plan - 08/11/15 1245    Clinical Impression Statement Session focus on improving balance training on dynamic surfaces with head turns, eyes closed and leaning outside BOS with  recovery for core strenghtening to improve balance with min guard to min A requried to reduce risk of falls.  Added sidestepping with resistance and increased weight with sidelying abduction for glut med strengthening.  No reports of pain through session.     Pt will benefit from skilled therapeutic intervention in order to improve on the following deficits Abnormal gait;Hypomobility;Decreased scar mobility;Decreased activity tolerance;Decreased strength;Pain;Decreased balance;Decreased range of motion;Postural dysfunction   Rehab Potential Excellent   Clinical Impairments Affecting Rehab Potential respiratory limitations- chronic bronchitis and asthma    PT Frequency 3x / week   PT Duration 4 weeks   PT Treatment/Interventions ADLs/Self Care Home Management;Cryotherapy;Gait training;DME Instruction;Stair training;Functional mobility training;Therapeutic activities;Therapeutic exercise;Balance training;Neuromuscular re-education;Patient/family education;Manual techniques;Scar mobilization   PT Next Visit Plan Continue balance training including narrow base of support with head turns; eyes closed next treatment as well as stair training. Work hip abductors.         Problem List Patient Active Problem List   Diagnosis Date Noted  . Medial meniscus, posterior horn derangement   . Arthritis of knee   . Primary osteoarthritis of left knee 04/05/2014  . Diastolic dysfunction    Ihor Austin, LPTA; [place]  Aldona Lento 08/11/2015, 12:53 PM  [place], [place], [address] Phone: [phone]   Fax:  [phone]  Name: Laura Carter MRN:  [id number] Date of Birth: May 16, 1941

## 2015-08-14 ENCOUNTER — Ambulatory Visit (HOSPITAL_COMMUNITY): Payer: Medicare Other | Admitting: Physical Therapy

## 2015-08-14 DIAGNOSIS — R262 Difficulty in walking, not elsewhere classified: Secondary | ICD-10-CM

## 2015-08-14 DIAGNOSIS — M25662 Stiffness of left knee, not elsewhere classified: Secondary | ICD-10-CM

## 2015-08-14 DIAGNOSIS — M25562 Pain in left knee: Secondary | ICD-10-CM

## 2015-08-14 DIAGNOSIS — Z9889 Other specified postprocedural states: Secondary | ICD-10-CM

## 2015-08-14 DIAGNOSIS — R600 Localized edema: Secondary | ICD-10-CM | POA: Diagnosis not present

## 2015-08-14 NOTE — Therapy (Signed)
[place], [place], [address] Phone: [phone]   Fax:  [phone]  Physical Therapy Treatment  Patient Details  Name: Laura Carter MRN: [id number] Date of Birth: 22-Jan-1941 Referring Provider: Arther Abbott, MD  Encounter Date: 08/14/2015      PT End of Session - 08/14/15 1245    Visit Number 21   Number of Visits 32   Date for PT Re-Evaluation 09/25/15   Authorization Type Medicare/BCBS Supplemental (G-code done 21st session)   Authorization Time Period 06/21/15 to 08/19/15   Authorization - Visit Number 1   Authorization - Number of Visits 10   PT Start Time 0940  pt arrived late   PT Stop Time 1016   PT Time Calculation (min) 36 min   Equipment Utilized During Treatment Gait belt   Activity Tolerance Patient tolerated treatment well   Behavior During Therapy [facility] for tasks assessed/performed      Past Medical History  Diagnosis Date  . Diastolic dysfunction   . Hypertension   . Asthma   . GERD (gastroesophageal reflux disease)     Past Surgical History  Procedure Laterality Date  . Cholecystectomy  10/10  . Knee arthroscopy with medial menisectomy Left 06/16/2015    Procedure: KNEE ARTHROSCOPY WITH PARTIAL MEDIAL MENISECTOMY;  Surgeon: Carole Civil, MD;  Location: [facility];  Service: Orthopedics;  Laterality: Left;    There were no vitals filed for this visit.  Visit Diagnosis:  Knee stiffness, left - Plan: PT plan of care cert/re-cert  Left knee pain - Plan: PT plan of care cert/re-cert  Localized edema - Plan: PT plan of care cert/re-cert  S/P left knee arthroscopy - Plan: PT plan of care cert/re-cert  Difficulty walking - Plan: PT plan of care cert/re-cert      Subjective Assessment - 08/14/15 0943    Subjective Pt reports she is sore from increase in activity this weekend. She states she has increased difficulty with uneven surfaces where she feels her knee is unstable. Also  notes increased difficulty with stairs.    Pertinent History Patient reports that her knee pain has gone on for awhile; she saw Dr. [name] the fall of 2016, and was told that she had arthritis and a possible meniscus tear. Knee just kept progressing and she began having constant pain late last year. Had arthoscopy performed on Friday the 20th.    How long can you sit comfortably? 2 hours   How long can you stand comfortably? "good while"   How long can you walk comfortably? Using her cart while at the grocery sto, but generally doesn't walk    Patient Stated Goals improved balance and strength   Currently in Pain? No/denies            [facility] PT Assessment - 08/14/15 0001    Assessment   Medical Diagnosis s/p L knee arthoscopy    Referring Provider Arther Abbott, MD   Onset Date/Surgical Date 06/16/15   Next MD Visit 08/18/2015   Precautions   Precautions None   Restrictions   Weight Bearing Restrictions No   Balance Screen   Has the patient fallen in the past 6 months No   Has the patient had a decrease in activity level because of a fear of falling?  No   Is the patient reluctant to leave their home because of a fear of falling?  No   Prior Function   Level of Independence Independent;Independent with  basic ADLs;Independent with gait;Independent with transfers   Vocation Retired   Leisure yoga, massages, walking    AROM   Left Knee Extension 10   Left Knee Flexion 130   Strength   Right Hip Flexion 5/5   Right Hip Extension 3/5   Right Hip ABduction 5/5   Left Hip Flexion 5/5   Left Hip Extension 3+/5   Left Hip ABduction 4+/5   Right Knee Flexion 5/5   Right Knee Extension 5/5   Left Knee Flexion 5/5   Left Knee Extension 5/5   Right Ankle Dorsiflexion 5/5   Left Ankle Dorsiflexion 5/5   Ambulation/Gait   Gait Comments reduced knee extension and flexion during gait; reduced gait speed; reduced stance L LE/step R LE    6 minute walk test results    Aerobic  Endurance Distance Walked 904   Endurance additional comments 6MWT; no device, no rest breaks    High Level Balance   High Level Balance Comments TUG 9.3 without device                      [facility] Adult PT Treatment/Exercise - 08/14/15 0001    Transfers   Five time sit to stand comments  8.8sec                PT Education - 08/14/15 1244    Education provided Yes   Education Details reviewed goals and updated POC   Person(s) Educated Patient   Methods Explanation   Comprehension Verbalized understanding          PT Short Term Goals - 08/14/15 1009    PT SHORT TERM GOAL #1   Title Patient to demonstrate ROM 0-120 degrees L knee in order to improve overall mechanics and enhance mobiltiy    Baseline 2/20- 8-132; patient reports she had never been able to get L knee completely straight even before surgery; 08/14/15: 10-130 deg   Time 3   Period Weeks   Status Partially Met   PT SHORT TERM GOAL #2   Title Patient to be independent in at least 3 edema/pain management techniques, including but not limited to icing, exercise/stretching, retro-grade massage in roder to increase independence in managing condition    Baseline 2/20- has been doing some but not a lot    Time 3   Period Weeks   Status Achieved   PT SHORT TERM GOAL #3   Title Paitent to be able to ambulate unlimited distances with no assistive device, improved gait mechanics including equal stance/step lengths/times, improved gait speed, and full L knee ROM in order to enhance mobilty    Baseline 2/20- patient is still taking cane with her but does not use it a lot; however does have a bit of trouble with mechanics the further she walks; 08/14/15: Pt not using SPC recently, but occasionally will use when her LLE feels weak.     Time 3   Period Weeks   Status Partially Met   PT SHORT TERM GOAL #4   Title Patient to be indpendent in correctly and consistently performing appropriate HEP, to be updated PRN     Baseline 2/20- doing everything throughout the day    Time 3   Period Weeks   Status Achieved           PT Long Term Goals - 08/14/15 1010    PT LONG TERM GOAL #1   Title Patient to demonstrate strength 5/5 in all tested  muscle groups in order to reduce pain, enhance regional stability, and improve gait    Baseline 08/14/15: All but hip extension, 5/5   Time 6   Period Weeks   Status Partially Met   PT LONG TERM GOAL #2   Title Patient to report she has been able to return to yoga with L knee pain no more than 2/10 and with no ROM based limiations that prevent her from particicpating in program in order to assist in returning to PLOF    Baseline 2/20- has not tried yet but reports she may get private lessons to start off; 08/14/15: pt started back to yoga last wednesday, however she is unable to perform alll of the acitivities secondary to ROM limitations.    Time 6   Period Weeks   Status Partially Met   PT LONG TERM GOAL #3   Title Patient to be able to ascend/descend full flight of stairs with reciprocal pattern, no railings, minimal unsteadiness, good eccentric control in order to improve overall mobilty at home and in community    Baseline 2/20- stairs is still a major difficulty for patient 08/14/15: poor eccentric control with LLE during ascend; requires handrails for steadiness during ascend/descend.    Time 6   Period Weeks   Status On-going   PT LONG TERM GOAL #4   Title Patient to be able to perform TUG in 8 seconds and 5x sit to stand in 12 seconds or less in order to demonstrate improved balance and muscle power for more independence in mobiltyi    Baseline 2/20- TUG 12 seconds and 5x sit to stand in 11.23 seconds; 08/14/15: Sit to stand: 8 sec, TUG: 9.3 sec   Time 6   Period Weeks   Status Partially Met               Plan - 08/14/15 1250    Clinical Impression Statement Pt was reassessed this session. She continues to make improvements towards her goals  meeting some and progression towards others. Her speed and endurance have improved as evident by her decrease in TUG and 5 times sit to stand. She is no longer using her SPC around the house, as she feels she is slightly more steady than her initial evaluation. Her BLE strength has also improved to 5/5 throughout except hip extension which is roughly 3/5 MMT. Remaining impairments include hip extensor weakness and limited knee ROM lacking ~10 degrees which is impacting her balance, ambulation and ability to ascend/descend stairs. Pt would benefit from continued PT services addressing her strength, ROM, gait, and balance to improve her safe return to independence with ADLs.    Pt will benefit from skilled therapeutic intervention in order to improve on the following deficits Abnormal gait;Hypomobility;Decreased scar mobility;Decreased activity tolerance;Decreased strength;Pain;Decreased balance;Decreased range of motion;Postural dysfunction   Rehab Potential Excellent   Clinical Impairments Affecting Rehab Potential respiratory limitations- chronic bronchitis and asthma    PT Frequency 2x / week   PT Duration 6 weeks   PT Treatment/Interventions ADLs/Self Care Home Management;Cryotherapy;Gait training;DME Instruction;Stair training;Functional mobility training;Therapeutic activities;Therapeutic exercise;Balance training;Neuromuscular re-education;Patient/family education;Manual techniques;Scar mobilization;Moist Heat;Biofeedback;Aquatic Therapy;Passive range of motion;Taping   PT Next Visit Plan update HEP and address knee extension ROM next visit; Continue with hip strenghtening (hip extensors specifically) and functional quad strength; balance training progression uneven surfaces, etc.   PT Home Exercise Plan Changes to be made next visit.   Consulted and Agree with Plan of Care Patient  G-Codes - 08/14/15 1310    Functional Assessment Tool Used Clinical judgement based on assessment of  functional strength, balance, gait and mobility.    Functional Limitation Mobility: Walking and moving around   Mobility: Walking and Moving Around Current Status [phone]) At least 20 percent but less than 40 percent impaired, limited or restricted   Mobility: Walking and Moving Around Goal Status [phone]) At least 1 percent but less than 20 percent impaired, limited or restricted      Problem List Patient Active Problem List   Diagnosis Date Noted  . Medial meniscus, posterior horn derangement   . Arthritis of knee   . Primary osteoarthritis of left knee 04/05/2014  . Diastolic dysfunction     6:26 PM,08/14/2015 Elly Modena PT, DPT [facility] [place] [address] [place], [place], [address] Phone: [phone]   Fax:  [phone]  Name: Laura Carter MRN: [id number] Date of Birth: [birth year]

## 2015-08-16 ENCOUNTER — Ambulatory Visit (HOSPITAL_COMMUNITY): Payer: Medicare Other

## 2015-08-16 DIAGNOSIS — M170 Bilateral primary osteoarthritis of knee: Secondary | ICD-10-CM | POA: Diagnosis not present

## 2015-08-16 DIAGNOSIS — M25562 Pain in left knee: Secondary | ICD-10-CM | POA: Diagnosis not present

## 2015-08-16 DIAGNOSIS — Z9889 Other specified postprocedural states: Secondary | ICD-10-CM

## 2015-08-16 DIAGNOSIS — R600 Localized edema: Secondary | ICD-10-CM | POA: Diagnosis not present

## 2015-08-16 DIAGNOSIS — M25662 Stiffness of left knee, not elsewhere classified: Secondary | ICD-10-CM | POA: Diagnosis not present

## 2015-08-16 DIAGNOSIS — R262 Difficulty in walking, not elsewhere classified: Secondary | ICD-10-CM | POA: Diagnosis not present

## 2015-08-16 DIAGNOSIS — M25561 Pain in right knee: Secondary | ICD-10-CM | POA: Diagnosis not present

## 2015-08-16 NOTE — Patient Instructions (Signed)
Continue quad sets to improve knee extension when laying on back. Quad Sets    Slowly tighten thigh muscles of straight, left leg while counting out loud to 5 seconds. Relax. Repeat 20 times. Do 1-2 sessions per day.  http://[email or web address].exer.us/293   Copyright  VHI. All rights reserved.   Knee Extension: Terminal - Standing (Single Leg)    With theraband behind knee full extend knee hold for 5-10 seconds.  Repeat 10-20 reps  http://tub.exer.[email or web address]   Copyright  VHI. All rights reserved.   FUNCTIONAL MOBILITY: Squat    Stance: shoulder-width on floor. Bend hips and knees. Keep back straight. Do not allow knees to bend past toes. Squeeze glutes and quads to stand. 10-20 reps per set, 3-5 days per week  Copyright  VHI. All rights reserved.  Climbing Stairs    When climbing stairs, breathe in with first step, breathe out through pursed lips with two or three steps. If you are more short of breath than usual: Breathe in while standing still. Breathe out through pursed lips while stepping up. Stop, then repeat with each step. Take only one step at a time. Do not hold your breath when climbing steps.  Copyright  VHI. All rights reserved.    Extension    Lift leg up in the air and bring it back down. Repeat with other leg. Repea 10-20 times. Do 1-2 sessions per day.  http://[email or web address].exer.us/388   Copyright  VHI. All rights reserved.   Forward Lunge    Standing with feet shoulder width apart and stomach tight, step forward with left leg. Repeat 10-20 times per set. Do 1-2 sessions per day.  http://orth.exer.us/1147   Copyright  VHI. All rights reserved.

## 2015-08-16 NOTE — Therapy (Signed)
[place], [place], [address] Phone: [phone]   Fax:  [phone]  Physical Therapy Treatment  Patient Details  Name: Laura Carter MRN: [id number] Date of Birth: [birth year] Referring Provider: Arther Abbott, MD  Encounter Date: 08/16/2015      PT End of Session - 08/16/15 0935    Visit Number 22   Number of Visits 32   Date for PT Re-Evaluation 09/11/15   Authorization Type Medicare/BCBS Supplemental (G-code done 21st session)   Authorization Time Period 06/21/15 to 08/19/15; 08/14/2015- 09/25/2015   Authorization - Visit Number 68   Authorization - Number of Visits 31   PT Start Time 0927   PT Stop Time 1015   PT Time Calculation (min) 48 min   Activity Tolerance Patient tolerated treatment well   Behavior During Therapy [facility] for tasks assessed/performed      Past Medical History  Diagnosis Date  . Diastolic dysfunction   . Hypertension   . Asthma   . GERD (gastroesophageal reflux disease)     Past Surgical History  Procedure Laterality Date  . Cholecystectomy  10/10  . Knee arthroscopy with medial menisectomy Left 06/16/2015    Procedure: KNEE ARTHROSCOPY WITH PARTIAL MEDIAL MENISECTOMY;  Surgeon: Carole Civil, MD;  Location: [facility];  Service: Orthopedics;  Laterality: Left;    There were no vitals filed for this visit.  Visit Diagnosis:  Knee stiffness, left  Left knee pain  Localized edema  S/P left knee arthroscopy  Difficulty walking      Subjective Assessment - 08/16/15 0927    Subjective Pt stated pain free currently.  Noted increased pain with increased activities, walking for long periods of time on dynamic surface, stairs and carrying objects   Pertinent History Patient reports that her knee pain has gone on for awhile; she saw Dr. [name] the fall of 2016, and was told that she had arthritis and a possible meniscus tear. Knee just kept progressing and she began having  constant pain late last year. Had arthoscopy performed on Friday the 20th.    Patient Stated Goals improved balance and strength   Currently in Pain? No/denies                         [facility] Adult PT Treatment/Exercise - 08/16/15 0001    Knee/Hip Exercises: Stretches   Active Hamstring Stretch Both;3 reps;30 seconds   Active Hamstring Stretch Limitations 12 inch box    Gastroc Stretch Both;3 reps;30 seconds   Gastroc Stretch Limitations slant board   Knee/Hip Exercises: Standing   Terminal Knee Extension Limitations TKE 15x 5" with manual    Lateral Step Up Left;15 reps;Hand Hold: 2;Step Height: 6"   Forward Step Up Left;15 reps;Hand Hold: 1;Step Height: 6"   Step Down Left;10 reps;Hand Hold: 1;Step Height: 6"   Functional Squat 15 reps   SLS Rt 9", Lt 4" max of 3   Knee/Hip Exercises: Prone   Hip Extension 15 reps                PT Education - 08/16/15 1049    Education provided Yes   Education Details Advanced HEP given   Person(s) Educated Patient   Methods Explanation;Demonstration;Handout   Comprehension Verbalized understanding;Returned demonstration          PT Short Term Goals - 08/14/15 1009    PT SHORT TERM GOAL #1   Title Patient to demonstrate ROM  0-120 degrees L knee in order to improve overall mechanics and enhance mobiltiy    Baseline 2/20- 8-132; patient reports she had never been able to get L knee completely straight even before surgery; 08/14/15: 10-130 deg   Time 3   Period Weeks   Status Partially Met   PT SHORT TERM GOAL #2   Title Patient to be independent in at least 3 edema/pain management techniques, including but not limited to icing, exercise/stretching, retro-grade massage in roder to increase independence in managing condition    Baseline 2/20- has been doing some but not a lot    Time 3   Period Weeks   Status Achieved   PT SHORT TERM GOAL #3   Title Paitent to be able to ambulate unlimited distances with no  assistive device, improved gait mechanics including equal stance/step lengths/times, improved gait speed, and full L knee ROM in order to enhance mobilty    Baseline 2/20- patient is still taking cane with her but does not use it a lot; however does have a bit of trouble with mechanics the further she walks; 08/14/15: Pt not using SPC recently, but occasionally will use when her LLE feels weak.     Time 3   Period Weeks   Status Partially Met   PT SHORT TERM GOAL #4   Title Patient to be indpendent in correctly and consistently performing appropriate HEP, to be updated PRN    Baseline 2/20- doing everything throughout the day    Time 3   Period Weeks   Status Achieved           PT Long Term Goals - 08/14/15 1010    PT LONG TERM GOAL #1   Title Patient to demonstrate strength 5/5 in all tested muscle groups in order to reduce pain, enhance regional stability, and improve gait    Baseline 08/14/15: All but hip extension, 5/5   Time 6   Period Weeks   Status Partially Met   PT LONG TERM GOAL #2   Title Patient to report she has been able to return to yoga with L knee pain no more than 2/10 and with no ROM based limiations that prevent her from particicpating in program in order to assist in returning to PLOF    Baseline 2/20- has not tried yet but reports she may get private lessons to start off; 08/14/15: pt started back to yoga last wednesday, however she is unable to perform alll of the acitivities secondary to ROM limitations.    Time 6   Period Weeks   Status Partially Met   PT LONG TERM GOAL #3   Title Patient to be able to ascend/descend full flight of stairs with reciprocal pattern, no railings, minimal unsteadiness, good eccentric control in order to improve overall mobilty at home and in community    Baseline 2/20- stairs is still a major difficulty for patient 08/14/15: poor eccentric control with LLE during ascend; requires handrails for steadiness during ascend/descend.    Time  6   Period Weeks   Status On-going   PT LONG TERM GOAL #4   Title Patient to be able to perform TUG in 8 seconds and 5x sit to stand in 12 seconds or less in order to demonstrate improved balance and muscle power for more independence in mobiltyi    Baseline 2/20- TUG 12 seconds and 5x sit to stand in 11.23 seconds; 08/14/15: Sit to stand: 8 sec, TUG: 9.3 sec   Time 6  Period Weeks   Status Partially Met               Plan - 08/16/15 1038    Clinical Impression Statement Session focus on improving quad and gluteal strengtehning to improve knee extension and functional strengthening.  Increased step down height to improve quad strengthening with min cueing to improve eccentric control.  Pt c/o crepitus discomfort during step down training, would not state painful though.  Pt given advanced HEP with focus on improving knee extension and gluteal strengthening.   Pt will benefit from skilled therapeutic intervention in order to improve on the following deficits Abnormal gait;Hypomobility;Decreased scar mobility;Decreased activity tolerance;Decreased strength;Pain;Decreased balance;Decreased range of motion;Postural dysfunction   Rehab Potential Excellent   Clinical Impairments Affecting Rehab Potential respiratory limitations- chronic bronchitis and asthma    PT Frequency 2x / week   PT Duration 6 weeks   PT Treatment/Interventions ADLs/Self Care Home Management;Cryotherapy;Gait training;DME Instruction;Stair training;Functional mobility training;Therapeutic activities;Therapeutic exercise;Balance training;Neuromuscular re-education;Patient/family education;Manual techniques;Scar mobilization;Moist Heat;Biofeedback;Aquatic Therapy;Passive range of motion;Taping   PT Next Visit Plan Continue to address knee extension ROM, hip strenghtening (hip extensors specifically) and functional quad strength; balance training progression uneven surfaces, etc.   PT Home Exercise Plan advanced HEP given         Problem List Patient Active Problem List   Diagnosis Date Noted  . Medial meniscus, posterior horn derangement   . Arthritis of knee   . Primary osteoarthritis of left knee 04/05/2014  . Diastolic dysfunction    Ihor Austin, LPTA; CBIS [phone]  Aldona Lento 08/16/2015, 10:50 AM  [place], [place], [address] Phone: [phone]   Fax:  [phone]  Name: MENDI CONSTABLE MRN: [id number] Date of Birth: 1940/12/18

## 2015-08-17 DIAGNOSIS — F411 Generalized anxiety disorder: Secondary | ICD-10-CM | POA: Diagnosis not present

## 2015-08-18 ENCOUNTER — Ambulatory Visit (INDEPENDENT_AMBULATORY_CARE_PROVIDER_SITE_OTHER): Payer: Medicare Other | Admitting: Orthopedic Surgery

## 2015-08-18 ENCOUNTER — Ambulatory Visit (HOSPITAL_COMMUNITY): Payer: Medicare Other

## 2015-08-18 VITALS — BP 127/67 | Ht 65.0 in | Wt 174.0 lb

## 2015-08-18 DIAGNOSIS — M25662 Stiffness of left knee, not elsewhere classified: Secondary | ICD-10-CM | POA: Diagnosis not present

## 2015-08-18 DIAGNOSIS — M25562 Pain in left knee: Secondary | ICD-10-CM | POA: Diagnosis not present

## 2015-08-18 DIAGNOSIS — R262 Difficulty in walking, not elsewhere classified: Secondary | ICD-10-CM | POA: Diagnosis not present

## 2015-08-18 DIAGNOSIS — Z9889 Other specified postprocedural states: Secondary | ICD-10-CM

## 2015-08-18 DIAGNOSIS — Z4789 Encounter for other orthopedic aftercare: Secondary | ICD-10-CM

## 2015-08-18 DIAGNOSIS — R600 Localized edema: Secondary | ICD-10-CM

## 2015-08-18 NOTE — Therapy (Signed)
[place], [place], [address] Phone: [phone]   Fax:  [phone]  Physical Therapy Treatment  Patient Details  Name: Laura Carter MRN: [id number] Date of Birth: November 03, 1940 Referring Provider: Arther Abbott, MD  Encounter Date: 08/18/2015      PT End of Session - 08/18/15 0942    Visit Number 23   Number of Visits 32   Date for PT Re-Evaluation 09/11/15   Authorization Type Medicare/BCBS Supplemental (G-code done 21st session)   Authorization Time Period 06/21/15 to 08/19/15; 08/14/2015- 09/25/2015   Authorization - Visit Number 23   Authorization - Number of Visits 31   PT Start Time 0933   PT Stop Time 1011   PT Time Calculation (min) 38 min   Activity Tolerance Patient tolerated treatment well   Behavior During Therapy [facility] for tasks assessed/performed      Past Medical History  Diagnosis Date  . Diastolic dysfunction   . Hypertension   . Asthma   . GERD (gastroesophageal reflux disease)     Past Surgical History  Procedure Laterality Date  . Cholecystectomy  10/10  . Knee arthroscopy with medial menisectomy Left 06/16/2015    Procedure: KNEE ARTHROSCOPY WITH PARTIAL MEDIAL MENISECTOMY;  Surgeon: Carole Civil, MD;  Location: [facility];  Service: Orthopedics;  Laterality: Left;    There were no vitals filed for this visit.  Visit Diagnosis:  Knee stiffness, left  Left knee pain  Localized edema  S/P left knee arthroscopy  Difficulty walking      Subjective Assessment - 08/18/15 0934    Subjective Pt stated she went to [facility] on Wednesday, had xray and [place] and discovered fluid on Lt and Rt knee and arthritis Bil knee, was given injection of hyularonic acid and will continue for 8 weeks.  Reports rough night last night. Did continue HEP with less reps and reports cleaning closet with increased pain following.     Pertinent History Patient reports that her knee pain has gone on for  awhile; she saw Dr. [name] the fall of 2016, and was told that she had arthritis and a possible meniscus tear. Knee just kept progressing and she began having constant pain late last year. Had arthoscopy performed on Friday the 20th.    Patient Stated Goals improved balance and strength   Currently in Pain? Yes   Pain Score 4    Pain Location Knee   Pain Orientation Left   Pain Descriptors / Indicators Sore   Pain Type Surgical pain   Pain Onset More than a month ago   Pain Frequency Intermittent   Aggravating Factors  lfiting something heavy   Pain Relieving Factors just depends on the leg and the day   Effect of Pain on Daily Activities does not stop her from doing much            [facility] Adult PT Treatment/Exercise - 08/18/15 0001    Knee/Hip Exercises: Stretches   Active Hamstring Stretch Both;3 reps;30 seconds   Active Hamstring Stretch Limitations supine rope   Knee/Hip Exercises: Aerobic   Stationary Bike 5' at beginning of session   Knee/Hip Exercises: Standing   Terminal Knee Extension Limitations TKE 15x 5" with manual    SLS Rt 10" Lt 5" max of 3   Knee/Hip Exercises: Supine   Terminal Knee Extension 15 reps   Terminal Knee Extension Limitations 5" holds   Knee/Hip Exercises: Sidelying   Hip ABduction 15 reps  Hip ABduction Limitations 4#   Knee/Hip Exercises: Prone   Hip Extension 15 reps                  PT Short Term Goals - 08/14/15 1009    PT SHORT TERM GOAL #1   Title Patient to demonstrate ROM 0-120 degrees L knee in order to improve overall mechanics and enhance mobiltiy    Baseline 2/20- 8-132; patient reports she had never been able to get L knee completely straight even before surgery; 08/14/15: 10-130 deg   Time 3   Period Weeks   Status Partially Met   PT SHORT TERM GOAL #2   Title Patient to be independent in at least 3 edema/pain management techniques, including but not limited to icing, exercise/stretching, retro-grade massage in  roder to increase independence in managing condition    Baseline 2/20- has been doing some but not a lot    Time 3   Period Weeks   Status Achieved   PT SHORT TERM GOAL #3   Title Paitent to be able to ambulate unlimited distances with no assistive device, improved gait mechanics including equal stance/step lengths/times, improved gait speed, and full L knee ROM in order to enhance mobilty    Baseline 2/20- patient is still taking cane with her but does not use it a lot; however does have a bit of trouble with mechanics the further she walks; 08/14/15: Pt not using SPC recently, but occasionally will use when her LLE feels weak.     Time 3   Period Weeks   Status Partially Met   PT SHORT TERM GOAL #4   Title Patient to be indpendent in correctly and consistently performing appropriate HEP, to be updated PRN    Baseline 2/20- doing everything throughout the day    Time 3   Period Weeks   Status Achieved           PT Long Term Goals - 08/14/15 1010    PT LONG TERM GOAL #1   Title Patient to demonstrate strength 5/5 in all tested muscle groups in order to reduce pain, enhance regional stability, and improve gait    Baseline 08/14/15: All but hip extension, 5/5   Time 6   Period Weeks   Status Partially Met   PT LONG TERM GOAL #2   Title Patient to report she has been able to return to yoga with L knee pain no more than 2/10 and with no ROM based limiations that prevent her from particicpating in program in order to assist in returning to PLOF    Baseline 2/20- has not tried yet but reports she may get private lessons to start off; 08/14/15: pt started back to yoga last wednesday, however she is unable to perform alll of the acitivities secondary to ROM limitations.    Time 6   Period Weeks   Status Partially Met   PT LONG TERM GOAL #3   Title Patient to be able to ascend/descend full flight of stairs with reciprocal pattern, no railings, minimal unsteadiness, good eccentric control in  order to improve overall mobilty at home and in community    Baseline 2/20- stairs is still a major difficulty for patient 08/14/15: poor eccentric control with LLE during ascend; requires handrails for steadiness during ascend/descend.    Time 6   Period Weeks   Status On-going   PT LONG TERM GOAL #4   Title Patient to be able to perform TUG in 8  seconds and 5x sit to stand in 12 seconds or less in order to demonstrate improved balance and muscle power for more independence in mobiltyi    Baseline 2/20- TUG 12 seconds and 5x sit to stand in 11.23 seconds; 08/14/15: Sit to stand: 8 sec, TUG: 9.3 sec   Time 6   Period Weeks   Status Partially Met               Plan - 08/18/15 0956    Clinical Impression Statement Session focus on improving quad and gluteal strengthening to improve knee extension and functional strengthening.  Decreased intensity this session following reports of injection 2 days ago and increased pain upon entrance  Began session with recumbent bike (no charge) to improve knee mobility for pain control.  End of session pt reports pain reduced to 1/10.  Pt given paper copy of reassessment completed last session to take to MD for apt later today.   Pt will benefit from skilled therapeutic intervention in order to improve on the following deficits Abnormal gait;Hypomobility;Decreased scar mobility;Decreased activity tolerance;Decreased strength;Pain;Decreased balance;Decreased range of motion;Postural dysfunction   Rehab Potential Excellent   Clinical Impairments Affecting Rehab Potential respiratory limitations- chronic bronchitis and asthma    PT Frequency 2x / week   PT Duration 6 weeks   PT Treatment/Interventions ADLs/Self Care Home Management;Cryotherapy;Gait training;DME Instruction;Stair training;Functional mobility training;Therapeutic activities;Therapeutic exercise;Balance training;Neuromuscular re-education;Patient/family education;Manual techniques;Scar  mobilization;Moist Heat;Biofeedback;Aquatic Therapy;Passive range of motion;Taping   PT Next Visit Plan Continue to address knee extension ROM, hip strenghtening (hip extensors specifically) and functional quad strength; balance training progression uneven surfaces, etc.        Problem List Patient Active Problem List   Diagnosis Date Noted  . Medial meniscus, posterior horn derangement   . Arthritis of knee   . Primary osteoarthritis of left knee 04/05/2014  . Diastolic dysfunction    Ihor Austin, LPTA; CBIS [phone] Aldona Lento 08/18/2015, 11:35 AM  [place], [place], [address] Phone: [phone]   Fax:  [phone]  Name: DISA RIEDLINGER MRN: [id number] Date of Birth: [birth year]

## 2015-08-18 NOTE — Progress Notes (Signed)
Chief Complaint  Patient presents with  . Follow-up    1 month follow up on left knee, DOS 06-16-15.   She is doing well she went to flex at [facility] stay gave her some hyaluronic acid injections  Her knee looks good today. She has full extension no swelling or flexion is good she complains of weakness and activity related discomfort depending on what she is doing  She will continue with therapy and follow-up in a month

## 2015-08-21 ENCOUNTER — Ambulatory Visit (HOSPITAL_COMMUNITY): Payer: Medicare Other | Admitting: Physical Therapy

## 2015-08-21 DIAGNOSIS — R262 Difficulty in walking, not elsewhere classified: Secondary | ICD-10-CM

## 2015-08-21 DIAGNOSIS — Z9889 Other specified postprocedural states: Secondary | ICD-10-CM

## 2015-08-21 DIAGNOSIS — M25562 Pain in left knee: Secondary | ICD-10-CM

## 2015-08-21 DIAGNOSIS — R600 Localized edema: Secondary | ICD-10-CM | POA: Diagnosis not present

## 2015-08-21 DIAGNOSIS — M25662 Stiffness of left knee, not elsewhere classified: Secondary | ICD-10-CM

## 2015-08-21 NOTE — Therapy (Signed)
[place], [place], [address] Phone: [phone]   Fax:  [phone]  Physical Therapy Treatment  Patient Details  Name: Laura Carter MRN: [id number] Date of Birth: [birth year] Referring Provider: Arther Abbott, MD  Encounter Date: 08/21/2015      PT End of Session - 08/21/15 1018    Visit Number 24   Number of Visits 32   Date for PT Re-Evaluation 09/11/15   Authorization Type Medicare/BCBS Supplemental (G-code done 21st session)   Authorization Time Period 06/21/15 to 08/19/15; 08/14/2015- 09/25/2015   Authorization - Visit Number 24   Authorization - Number of Visits 31   PT Start Time 0933   PT Stop Time 1014   PT Time Calculation (min) 41 min   Equipment Utilized During Treatment Gait belt   Activity Tolerance Patient tolerated treatment well   Behavior During Therapy [facility] for tasks assessed/performed      Past Medical History  Diagnosis Date  . Diastolic dysfunction   . Hypertension   . Asthma   . GERD (gastroesophageal reflux disease)     Past Surgical History  Procedure Laterality Date  . Cholecystectomy  10/10  . Knee arthroscopy with medial menisectomy Left 06/16/2015    Procedure: KNEE ARTHROSCOPY WITH PARTIAL MEDIAL MENISECTOMY;  Surgeon: Carole Civil, MD;  Location: [facility];  Service: Orthopedics;  Laterality: Left;    There were no vitals filed for this visit.  Visit Diagnosis:  Knee stiffness, left  Left knee pain  Localized edema  S/P left knee arthroscopy  Difficulty walking      Subjective Assessment - 08/21/15 0939    Subjective Pt states she started [employer] and will be having 7 more appointment. She says she felt some pain after church when she was wakling up/down inclines and stairs. She was able to find pain relief with self massage and it is not bothering her much currently. She saw Dr. [name] on Friday and says he has no concerns at this time. She reports  stiff soreness in her R knee, but notes that she does not consider this pain.    Pertinent History Patient reports that her knee pain has gone on for awhile; she saw Dr. [name] the fall of 2016, and was told that she had arthritis and a possible meniscus tear. Knee just kept progressing and she began having constant pain late last year. Had arthoscopy performed on Friday the 20th.    Currently in Pain? No/denies                         [facility] Adult PT Treatment/Exercise - 08/21/15 0001    Knee/Hip Exercises: Aerobic   Nustep 6 min, L 3   Knee/Hip Exercises: Standing   Functional Squat 10 reps  with 4" box under LLE   Knee/Hip Exercises: Supine   Bridges with Clamshell Both;2 sets;10 reps   Other Supine Knee/Hip Exercises SL squat on total gym L25, 2x10 with first set using RLE for assist, second set withtout assist   Manual Therapy   Manual Therapy Joint mobilization;Soft tissue mobilization;Passive ROM   Manual therapy comments Completed independently from other tx   Joint Mobilization L patella mobs I<>S using grade I-IV in supine   Soft tissue mobilization L distal quad for improved relaxation             Balance Exercises - 08/21/15 1014    Balance Exercises: Standing   Tandem  Stance Eyes open;3 reps;30 secs  Max 30sec with each   SLS Eyes open;Solid surface;2 reps;20 secs  LLE: 10 sec max, RLE: 15 sec max           PT Education - 08/21/15 1018    Education provided Yes   Education Details reviewed HEP and updated with bridge   Person(s) Educated Patient   Methods Explanation;Demonstration   Comprehension Verbalized understanding;Returned demonstration          PT Short Term Goals - 08/14/15 1009    PT SHORT TERM GOAL #1   Title Patient to demonstrate ROM 0-120 degrees L knee in order to improve overall mechanics and enhance mobiltiy    Baseline 2/20- 8-132; patient reports she had never been able to get L knee completely straight even before  surgery; 08/14/15: 10-130 deg   Time 3   Period Weeks   Status Partially Met   PT SHORT TERM GOAL #2   Title Patient to be independent in at least 3 edema/pain management techniques, including but not limited to icing, exercise/stretching, retro-grade massage in roder to increase independence in managing condition    Baseline 2/20- has been doing some but not a lot    Time 3   Period Weeks   Status Achieved   PT SHORT TERM GOAL #3   Title Paitent to be able to ambulate unlimited distances with no assistive device, improved gait mechanics including equal stance/step lengths/times, improved gait speed, and full L knee ROM in order to enhance mobilty    Baseline 2/20- patient is still taking cane with her but does not use it a lot; however does have a bit of trouble with mechanics the further she walks; 08/14/15: Pt not using SPC recently, but occasionally will use when her LLE feels weak.     Time 3   Period Weeks   Status Partially Met   PT SHORT TERM GOAL #4   Title Patient to be indpendent in correctly and consistently performing appropriate HEP, to be updated PRN    Baseline 2/20- doing everything throughout the day    Time 3   Period Weeks   Status Achieved           PT Long Term Goals - 08/14/15 1010    PT LONG TERM GOAL #1   Title Patient to demonstrate strength 5/5 in all tested muscle groups in order to reduce pain, enhance regional stability, and improve gait    Baseline 08/14/15: All but hip extension, 5/5   Time 6   Period Weeks   Status Partially Met   PT LONG TERM GOAL #2   Title Patient to report she has been able to return to yoga with L knee pain no more than 2/10 and with no ROM based limiations that prevent her from particicpating in program in order to assist in returning to PLOF    Baseline 2/20- has not tried yet but reports she may get private lessons to start off; 08/14/15: pt started back to yoga last wednesday, however she is unable to perform alll of the  acitivities secondary to ROM limitations.    Time 6   Period Weeks   Status Partially Met   PT LONG TERM GOAL #3   Title Patient to be able to ascend/descend full flight of stairs with reciprocal pattern, no railings, minimal unsteadiness, good eccentric control in order to improve overall mobilty at home and in community    Baseline 2/20- stairs is still a  major difficulty for patient 08/14/15: poor eccentric control with LLE during ascend; requires handrails for steadiness during ascend/descend.    Time 6   Period Weeks   Status On-going   PT LONG TERM GOAL #4   Title Patient to be able to perform TUG in 8 seconds and 5x sit to stand in 12 seconds or less in order to demonstrate improved balance and muscle power for more independence in mobiltyi    Baseline 2/20- TUG 12 seconds and 5x sit to stand in 11.23 seconds; 08/14/15: Sit to stand: 8 sec, TUG: 9.3 sec   Time 6   Period Weeks   Status Partially Met               Plan - 08/21/15 1019    Clinical Impression Statement Pt continues to make progress with decreasing pain report and improved tolerance to LE therex during her sessions. She has increased difficulty with uneven surfaces at home and when carrying objects that require increased quad activation secondary to quad activation/weakness. No pain increase this session, and she reports improved stiffness post manual treatment. Will continue current POC addressing LE strength, ROM, mobility and balance deficits.    Pt will benefit from skilled therapeutic intervention in order to improve on the following deficits Abnormal gait;Hypomobility;Decreased scar mobility;Decreased activity tolerance;Decreased strength;Pain;Decreased balance;Decreased range of motion;Postural dysfunction   Rehab Potential Excellent   Clinical Impairments Affecting Rehab Potential respiratory limitations- chronic bronchitis and asthma    PT Frequency 2x / week   PT Duration 6 weeks   PT  Treatment/Interventions ADLs/Self Care Home Management;Cryotherapy;Gait training;DME Instruction;Stair training;Functional mobility training;Therapeutic activities;Therapeutic exercise;Balance training;Neuromuscular re-education;Patient/family education;Manual techniques;Scar mobilization;Moist Heat;Biofeedback;Aquatic Therapy;Passive range of motion;Taping   PT Next Visit Plan Continue to address knee extension ROM, hip strenghtening (hip extensors specifically) and functional quad strength; balance training progression uneven surfaces, etc.   PT Home Exercise Plan Updated with bridge, continue to update as needed   Consulted and Agree with Plan of Care Patient        Problem List Patient Active Problem List   Diagnosis Date Noted  . Medial meniscus, posterior horn derangement   . Arthritis of knee   . Primary osteoarthritis of left knee 04/05/2014  . Diastolic dysfunction     34:14 AM,08/21/2015 Elly Modena PT, DPT [facility] [place] [address] [place], [place], [address] Phone: [phone]   Fax:  [phone]  Name: MAUDY YONAN MRN: [id number] Date of Birth: 01/01/1941

## 2015-08-23 ENCOUNTER — Encounter (HOSPITAL_COMMUNITY): Payer: Medicare Other | Admitting: Physical Therapy

## 2015-08-23 DIAGNOSIS — M1712 Unilateral primary osteoarthritis, left knee: Secondary | ICD-10-CM | POA: Diagnosis not present

## 2015-08-23 DIAGNOSIS — M25562 Pain in left knee: Secondary | ICD-10-CM | POA: Diagnosis not present

## 2015-08-25 ENCOUNTER — Ambulatory Visit (HOSPITAL_COMMUNITY): Payer: Medicare Other | Admitting: Physical Therapy

## 2015-08-25 DIAGNOSIS — R600 Localized edema: Secondary | ICD-10-CM

## 2015-08-25 DIAGNOSIS — M25562 Pain in left knee: Secondary | ICD-10-CM

## 2015-08-25 DIAGNOSIS — M25662 Stiffness of left knee, not elsewhere classified: Secondary | ICD-10-CM

## 2015-08-25 DIAGNOSIS — R262 Difficulty in walking, not elsewhere classified: Secondary | ICD-10-CM

## 2015-08-25 DIAGNOSIS — Z9889 Other specified postprocedural states: Secondary | ICD-10-CM

## 2015-08-25 NOTE — Therapy (Signed)
[place], [place], [address] Phone: [phone]   Fax:  [phone]  Physical Therapy Treatment  Patient Details  Name: Laura Carter MRN: [id number] Date of Birth: 08-13-1940 Referring Provider: Arther Abbott, MD  Encounter Date: 08/25/2015      PT End of Session - 08/25/15 1010    Visit Number 25   Number of Visits 32   Date for PT Re-Evaluation 09/11/15   Authorization Type Medicare/BCBS Supplemental (G-code done 21st session)   Authorization Time Period 06/21/15 to 08/19/15; 08/14/2015- 09/25/2015   Authorization - Visit Number 25   Authorization - Number of Visits 31   PT Start Time 0928   PT Stop Time 1013   PT Time Calculation (min) 45 min   Equipment Utilized During Treatment Gait belt   Activity Tolerance Patient tolerated treatment well      Past Medical History  Diagnosis Date  . Diastolic dysfunction   . Hypertension   . Asthma   . GERD (gastroesophageal reflux disease)     Past Surgical History  Procedure Laterality Date  . Cholecystectomy  10/10  . Knee arthroscopy with medial menisectomy Left 06/16/2015    Procedure: KNEE ARTHROSCOPY WITH PARTIAL MEDIAL MENISECTOMY;  Surgeon: Carole Civil, MD;  Location: [facility];  Service: Orthopedics;  Laterality: Left;    There were no vitals filed for this visit.  Visit Diagnosis:  Left knee pain  Localized edema  S/P left knee arthroscopy  Difficulty walking  Knee stiffness, left      Subjective Assessment - 08/25/15 0932    Subjective Pt states that things like soft ground, declines and inclines are giving her problems but she can tell that she is doing much better.  She states balance is probably her worst problem.     Currently in Pain? No/denies                         [facility] Adult PT Treatment/Exercise - 08/25/15 0001    Balance Poses: Yoga   Tree Pose 2 reps;30 seconds   Knee/Hip Exercises: Stretches   Press photographer Both;3 reps;30 seconds   Gastroc Stretch Limitations slant board   Knee/Hip Exercises: Standing   Stairs 2 Round trip   4" slowly    Rocker Board 2 minutes   Knee/Hip Exercises: Seated   Sit to [employer] 10 reps;Other (comment)  eccentric, cues for form 10 with right leg behind; 10left    Knee/Hip Exercises: Prone   Straight Leg Raises --  at counter hip extension x 15 reps each              Balance Exercises - 08/25/15 0934    Balance Exercises: Standing   SLS Eyes open;Solid surface;5 reps   Balance Beam forward x 2 RT; retro x 1 RT    Sidestepping 2 reps;Theraband  RTB             PT Short Term Goals - 08/14/15 1009    PT SHORT TERM GOAL #1   Title Patient to demonstrate ROM 0-120 degrees L knee in order to improve overall mechanics and enhance mobiltiy    Baseline 2/20- 8-132; patient reports she had never been able to get L knee completely straight even before surgery; 08/14/15: 10-130 deg   Time 3   Period Weeks   Status Partially Met   PT SHORT TERM GOAL #2   Title Patient to be independent in  at least 3 edema/pain management techniques, including but not limited to icing, exercise/stretching, retro-grade massage in roder to increase independence in managing condition    Baseline 2/20- has been doing some but not a lot    Time 3   Period Weeks   Status Achieved   PT SHORT TERM GOAL #3   Title Paitent to be able to ambulate unlimited distances with no assistive device, improved gait mechanics including equal stance/step lengths/times, improved gait speed, and full L knee ROM in order to enhance mobilty    Baseline 2/20- patient is still taking cane with her but does not use it a lot; however does have a bit of trouble with mechanics the further she walks; 08/14/15: Pt not using SPC recently, but occasionally will use when her LLE feels weak.     Time 3   Period Weeks   Status Partially Met   PT SHORT TERM GOAL #4   Title Patient to be indpendent in  correctly and consistently performing appropriate HEP, to be updated PRN    Baseline 2/20- doing everything throughout the day    Time 3   Period Weeks   Status Achieved           PT Long Term Goals - 08/14/15 1010    PT LONG TERM GOAL #1   Title Patient to demonstrate strength 5/5 in all tested muscle groups in order to reduce pain, enhance regional stability, and improve gait    Baseline 08/14/15: All but hip extension, 5/5   Time 6   Period Weeks   Status Partially Met   PT LONG TERM GOAL #2   Title Patient to report she has been able to return to yoga with L knee pain no more than 2/10 and with no ROM based limiations that prevent her from particicpating in program in order to assist in returning to PLOF    Baseline 2/20- has not tried yet but reports she may get private lessons to start off; 08/14/15: pt started back to yoga last wednesday, however she is unable to perform alll of the acitivities secondary to ROM limitations.    Time 6   Period Weeks   Status Partially Met   PT LONG TERM GOAL #3   Title Patient to be able to ascend/descend full flight of stairs with reciprocal pattern, no railings, minimal unsteadiness, good eccentric control in order to improve overall mobilty at home and in community    Baseline 2/20- stairs is still a major difficulty for patient 08/14/15: poor eccentric control with LLE during ascend; requires handrails for steadiness during ascend/descend.    Time 6   Period Weeks   Status On-going   PT LONG TERM GOAL #4   Title Patient to be able to perform TUG in 8 seconds and 5x sit to stand in 12 seconds or less in order to demonstrate improved balance and muscle power for more independence in mobiltyi    Baseline 2/20- TUG 12 seconds and 5x sit to stand in 11.23 seconds; 08/14/15: Sit to stand: 8 sec, TUG: 9.3 sec   Time 6   Period Weeks   Status Partially Met               Plan - 08/25/15 1010    Clinical Impression Statement Pt treatment  focused on balance as well as strengthening her hip extensor musculature.  Added steps, balance beam and yoga poses to treatment.    PT Next Visit Plan Continue to address  knee extension ROM, hip strenghtening (hip extensors specifically) and functional quad strength; balance training progression uneven surfaces, etc.        Problem List Patient Active Problem List   Diagnosis Date Noted  . Medial meniscus, posterior horn derangement   . Arthritis of knee   . Primary osteoarthritis of left knee 04/05/2014  . Diastolic dysfunction     Rayetta Humphrey, [place] CLT [phone] 08/25/2015, 10:19 AM  [place] [address] [place], [place], [address] Phone: [phone]   Fax:  [phone]  Name: OMMIE DEGEORGE MRN: [id number] Date of Birth: [birth year]

## 2015-08-28 DIAGNOSIS — M1711 Unilateral primary osteoarthritis, right knee: Secondary | ICD-10-CM | POA: Diagnosis not present

## 2015-08-28 DIAGNOSIS — M25561 Pain in right knee: Secondary | ICD-10-CM | POA: Diagnosis not present

## 2015-08-29 ENCOUNTER — Ambulatory Visit (HOSPITAL_COMMUNITY): Payer: Medicare Other | Attending: Orthopedic Surgery | Admitting: Physical Therapy

## 2015-08-29 DIAGNOSIS — R262 Difficulty in walking, not elsewhere classified: Secondary | ICD-10-CM

## 2015-08-29 DIAGNOSIS — M25662 Stiffness of left knee, not elsewhere classified: Secondary | ICD-10-CM | POA: Diagnosis not present

## 2015-08-29 DIAGNOSIS — R2242 Localized swelling, mass and lump, left lower limb: Secondary | ICD-10-CM | POA: Insufficient documentation

## 2015-08-29 DIAGNOSIS — R600 Localized edema: Secondary | ICD-10-CM

## 2015-08-29 DIAGNOSIS — M25562 Pain in left knee: Secondary | ICD-10-CM | POA: Diagnosis not present

## 2015-08-29 DIAGNOSIS — Z9889 Other specified postprocedural states: Secondary | ICD-10-CM

## 2015-08-29 NOTE — Therapy (Signed)
[place], [place], [address] Phone: [phone]   Fax:  [phone]  Physical Therapy Treatment  Patient Details  Name: Laura Carter MRN: [id number] Date of Birth: 11/16/1940 Referring Provider: Arther Abbott, MD  Encounter Date: 08/29/2015      PT End of Session - 08/29/15 1742    Visit Number 26   Number of Visits 32   Date for PT Re-Evaluation 09/11/15   Authorization Type Medicare/BCBS Supplemental (G-code done 21st session)   Authorization Time Period 06/21/15 to 08/19/15; 08/14/2015- 09/25/2015   Authorization - Visit Number 26   Authorization - Number of Visits 31   PT Start Time 1346   PT Stop Time 1430   PT Time Calculation (min) 44 min   Equipment Utilized During Treatment Gait belt   Activity Tolerance Patient tolerated treatment well      Past Medical History  Diagnosis Date  . Diastolic dysfunction   . Hypertension   . Asthma   . GERD (gastroesophageal reflux disease)     Past Surgical History  Procedure Laterality Date  . Cholecystectomy  10/10  . Knee arthroscopy with medial menisectomy Left 06/16/2015    Procedure: KNEE ARTHROSCOPY WITH PARTIAL MEDIAL MENISECTOMY;  Surgeon: Carole Civil, MD;  Location: [facility];  Service: Orthopedics;  Laterality: Left;    There were no vitals filed for this visit.  Visit Diagnosis:  Left knee pain  Localized edema  Difficulty walking  Knee stiffness, left  S/P left knee arthroscopy      Subjective Assessment - 08/29/15 1731    Subjective Pt states she goes for her 3rd injections this week at [facility].  Pt states she is hurting a little but not enough to report a number.     Currently in Pain? No/denies                         [facility] Adult PT Treatment/Exercise - 08/29/15 0001    Balance Poses: Yoga   Tree Pose 2 reps;30 seconds   Knee/Hip Exercises: Stretches   Press photographer Both;3 reps;30 seconds   Gastroc  Stretch Limitations slant board   Knee/Hip Exercises: Machines for Strengthening   Total Gym Leg Press squats at 31 degrees 20 reps   Knee/Hip Exercises: Standing   Rocker Board 2 minutes   Rocker Board Limitations Rt/Lt and A/P tap downs 1 minute and balance 1 minute   SLS with Vectors 10X5" each LE                  PT Short Term Goals - 08/14/15 1009    PT SHORT TERM GOAL #1   Title Patient to demonstrate ROM 0-120 degrees L knee in order to improve overall mechanics and enhance mobiltiy    Baseline 2/20- 8-132; patient reports she had never been able to get L knee completely straight even before surgery; 08/14/15: 10-130 deg   Time 3   Period Weeks   Status Partially Met   PT SHORT TERM GOAL #2   Title Patient to be independent in at least 3 edema/pain management techniques, including but not limited to icing, exercise/stretching, retro-grade massage in roder to increase independence in managing condition    Baseline 2/20- has been doing some but not a lot    Time 3   Period Weeks   Status Achieved   PT SHORT TERM GOAL #3   Title Paitent to be able to  ambulate unlimited distances with no assistive device, improved gait mechanics including equal stance/step lengths/times, improved gait speed, and full L knee ROM in order to enhance mobilty    Baseline 2/20- patient is still taking cane with her but does not use it a lot; however does have a bit of trouble with mechanics the further she walks; 08/14/15: Pt not using SPC recently, but occasionally will use when her LLE feels weak.     Time 3   Period Weeks   Status Partially Met   PT SHORT TERM GOAL #4   Title Patient to be indpendent in correctly and consistently performing appropriate HEP, to be updated PRN    Baseline 2/20- doing everything throughout the day    Time 3   Period Weeks   Status Achieved           PT Long Term Goals - 08/14/15 1010    PT LONG TERM GOAL #1   Title Patient to demonstrate strength 5/5  in all tested muscle groups in order to reduce pain, enhance regional stability, and improve gait    Baseline 08/14/15: All but hip extension, 5/5   Time 6   Period Weeks   Status Partially Met   PT LONG TERM GOAL #2   Title Patient to report she has been able to return to yoga with L knee pain no more than 2/10 and with no ROM based limiations that prevent her from particicpating in program in order to assist in returning to PLOF    Baseline 2/20- has not tried yet but reports she may get private lessons to start off; 08/14/15: pt started back to yoga last wednesday, however she is unable to perform alll of the acitivities secondary to ROM limitations.    Time 6   Period Weeks   Status Partially Met   PT LONG TERM GOAL #3   Title Patient to be able to ascend/descend full flight of stairs with reciprocal pattern, no railings, minimal unsteadiness, good eccentric control in order to improve overall mobilty at home and in community    Baseline 2/20- stairs is still a major difficulty for patient 08/14/15: poor eccentric control with LLE during ascend; requires handrails for steadiness during ascend/descend.    Time 6   Period Weeks   Status On-going   PT LONG TERM GOAL #4   Title Patient to be able to perform TUG in 8 seconds and 5x sit to stand in 12 seconds or less in order to demonstrate improved balance and muscle power for more independence in mobiltyi    Baseline 2/20- TUG 12 seconds and 5x sit to stand in 11.23 seconds; 08/14/15: Sit to stand: 8 sec, TUG: 9.3 sec   Time 6   Period Weeks   Status Partially Met               Plan - 08/29/15 1744    Clinical Impression Statement Continued with balance and glute stengthening therex.  Able to increase reps without difficulty.  Added vector stance for glute strengthening with noted weakness.  Encouraged patient to continue HEP.     PT Next Visit Plan Continue to address knee extension ROM, hip strenghtening (hip extensors specifically)  and functional quad strength; balance training progression uneven surfaces, etc.        Problem List Patient Active Problem List   Diagnosis Date Noted  . Medial meniscus, posterior horn derangement   . Arthritis of knee   . Primary osteoarthritis of left  knee 04/05/2014  . Diastolic dysfunction     Teena Irani, PTA/CLT [phone] 08/29/2015, 5:54 PM  [place] [address] [place], [place], [address] Phone: [phone]   Fax:  [phone]  Name: Laura Carter MRN: [id number] Date of Birth: 10/22/1940

## 2015-08-30 DIAGNOSIS — M1712 Unilateral primary osteoarthritis, left knee: Secondary | ICD-10-CM | POA: Diagnosis not present

## 2015-08-30 DIAGNOSIS — M25562 Pain in left knee: Secondary | ICD-10-CM | POA: Diagnosis not present

## 2015-09-01 ENCOUNTER — Ambulatory Visit (HOSPITAL_COMMUNITY): Payer: Medicare Other | Admitting: Physical Therapy

## 2015-09-01 DIAGNOSIS — R600 Localized edema: Secondary | ICD-10-CM | POA: Diagnosis not present

## 2015-09-01 DIAGNOSIS — M25562 Pain in left knee: Secondary | ICD-10-CM

## 2015-09-01 DIAGNOSIS — R262 Difficulty in walking, not elsewhere classified: Secondary | ICD-10-CM | POA: Diagnosis not present

## 2015-09-01 DIAGNOSIS — Z9889 Other specified postprocedural states: Secondary | ICD-10-CM

## 2015-09-01 DIAGNOSIS — M25662 Stiffness of left knee, not elsewhere classified: Secondary | ICD-10-CM

## 2015-09-01 DIAGNOSIS — R2242 Localized swelling, mass and lump, left lower limb: Secondary | ICD-10-CM | POA: Diagnosis not present

## 2015-09-01 NOTE — Therapy (Signed)
[place], [place], [address] Phone: [phone]   Fax:  [phone]  Physical Therapy Treatment  Patient Details  Name: Laura Carter MRN: [id number] Date of Birth: [birth year] Referring Provider: Arther Abbott, MD  Encounter Date: 09/01/2015      PT End of Session - 09/01/15 1102    Visit Number 27   Number of Visits 32   Date for PT Re-Evaluation 09/11/15   Authorization Type Medicare/BCBS Supplemental (G-code done 21st session)   Authorization Time Period 06/21/15 to 08/19/15; 08/14/2015- 09/25/2015   Authorization - Visit Number 11   Authorization - Number of Visits 31   PT Start Time 1017   PT Stop Time 1100  not inlcuding 5 min for ice   PT Time Calculation (min) 43 min   Equipment Utilized During Treatment Gait belt   Activity Tolerance Patient tolerated treatment well   Behavior During Therapy [facility] for tasks assessed/performed      Past Medical History  Diagnosis Date  . Diastolic dysfunction   . Hypertension   . Asthma   . GERD (gastroesophageal reflux disease)     Past Surgical History  Procedure Laterality Date  . Cholecystectomy  10/10  . Knee arthroscopy with medial menisectomy Left 06/16/2015    Procedure: KNEE ARTHROSCOPY WITH PARTIAL MEDIAL MENISECTOMY;  Surgeon: Carole Civil, MD;  Location: [facility];  Service: Orthopedics;  Laterality: Left;    There were no vitals filed for this visit.      Subjective Assessment - 09/01/15 1020    Subjective Pt notes that she has been experiencing some pain in her Lt knee which seems to be some muscle soreness. She also reports a "catching" inferior to her knee cap during activity, especially walking. No other complaints   Pertinent History Patient reports that her knee pain has gone on for awhile; she saw Dr. [name] the fall of 2016, and was told that she had arthritis and a possible meniscus tear. Knee just kept progressing and she began  having constant pain late last year. Had arthoscopy performed on Friday the 20th.    Currently in Pain? No/denies                         [facility] Adult PT Treatment/Exercise - 09/01/15 0001    Knee/Hip Exercises: Stretches   Active Hamstring Stretch Left;2 reps;30 seconds   Gastroc Stretch Left;2 reps;30 seconds   Gastroc Stretch Limitations against wall    Knee/Hip Exercises: Machines for Strengthening   Total Gym Leg Press single leg squat L23 3x5 reps   Manual Therapy   Manual Therapy Joint mobilization;Soft tissue mobilization;Passive ROM   Manual therapy comments performed separately from all other interventions   Joint Mobilization L patella mobs I<>S using grade I-IV in supine   Soft tissue mobilization trigger point release to Lt popliteus              Balance Exercises - 09/01/15 1055    Balance Exercises: Standing   Standing Eyes Opened Narrow base of support (BOS);Foam/compliant surface;2 reps;20 secs  with external perturbations    Tandem Stance Eyes open;Intermittent upper extremity support;2 reps;20 secs  with external perturbations           PT Education - 09/01/15 1100    Education provided Yes   Education Details Discussed alternative activities for improved fitness and strength without increased pressure on joints.   Person(s) Educated Patient  Methods Explanation   Comprehension Verbalized understanding          PT Short Term Goals - 08/14/15 1009    PT SHORT TERM GOAL #1   Title Patient to demonstrate ROM 0-120 degrees L knee in order to improve overall mechanics and enhance mobiltiy    Baseline 2/20- 8-132; patient reports she had never been able to get L knee completely straight even before surgery; 08/14/15: 10-130 deg   Time 3   Period Weeks   Status Partially Met   PT SHORT TERM GOAL #2   Title Patient to be independent in at least 3 edema/pain management techniques, including but not limited to icing, exercise/stretching,  retro-grade massage in roder to increase independence in managing condition    Baseline 2/20- has been doing some but not a lot    Time 3   Period Weeks   Status Achieved   PT SHORT TERM GOAL #3   Title Paitent to be able to ambulate unlimited distances with no assistive device, improved gait mechanics including equal stance/step lengths/times, improved gait speed, and full L knee ROM in order to enhance mobilty    Baseline 2/20- patient is still taking cane with her but does not use it a lot; however does have a bit of trouble with mechanics the further she walks; 08/14/15: Pt not using SPC recently, but occasionally will use when her LLE feels weak.     Time 3   Period Weeks   Status Partially Met   PT SHORT TERM GOAL #4   Title Patient to be indpendent in correctly and consistently performing appropriate HEP, to be updated PRN    Baseline 2/20- doing everything throughout the day    Time 3   Period Weeks   Status Achieved           PT Long Term Goals - 08/14/15 1010    PT LONG TERM GOAL #1   Title Patient to demonstrate strength 5/5 in all tested muscle groups in order to reduce pain, enhance regional stability, and improve gait    Baseline 08/14/15: All but hip extension, 5/5   Time 6   Period Weeks   Status Partially Met   PT LONG TERM GOAL #2   Title Patient to report she has been able to return to yoga with L knee pain no more than 2/10 and with no ROM based limiations that prevent her from particicpating in program in order to assist in returning to PLOF    Baseline 2/20- has not tried yet but reports she may get private lessons to start off; 08/14/15: pt started back to yoga last wednesday, however she is unable to perform alll of the acitivities secondary to ROM limitations.    Time 6   Period Weeks   Status Partially Met   PT LONG TERM GOAL #3   Title Patient to be able to ascend/descend full flight of stairs with reciprocal pattern, no railings, minimal unsteadiness,  good eccentric control in order to improve overall mobilty at home and in community    Baseline 2/20- stairs is still a major difficulty for patient 08/14/15: poor eccentric control with LLE during ascend; requires handrails for steadiness during ascend/descend.    Time 6   Period Weeks   Status On-going   PT LONG TERM GOAL #4   Title Patient to be able to perform TUG in 8 seconds and 5x sit to stand in 12 seconds or less in order to demonstrate improved  balance and muscle power for more independence in mobiltyi    Baseline 2/20- TUG 12 seconds and 5x sit to stand in 11.23 seconds; 08/14/15: Sit to stand: 8 sec, TUG: 9.3 sec   Time 6   Period Weeks   Status Partially Met               Plan - 09/01/15 1103    Clinical Impression Statement This session continued to focus on LE strength and balance activity for improved LE mobility. Pt reporting TTP along Lt popliteus which improved some after trigger point release technique. She continues to demonstrate increased awareness of catching and grinding sensations in her Lt knee during activity which therapist follow with discussion of the importance of continued flexibility/strengthening activity as well as ways to exercise without increased wear and tear on her joints. Pt understanding and tolerated session well with report of minimal muscle fatigue after activity.   Rehab Potential Excellent   Clinical Impairments Affecting Rehab Potential respiratory limitations- chronic bronchitis and asthma    PT Frequency 2x / week   PT Duration 6 weeks   PT Treatment/Interventions ADLs/Self Care Home Management;Cryotherapy;Gait training;DME Instruction;Stair training;Functional mobility training;Therapeutic activities;Therapeutic exercise;Balance training;Neuromuscular re-education;Patient/family education;Manual techniques;Scar mobilization;Moist Heat;Biofeedback;Aquatic Therapy;Passive range of motion;Taping   PT Next Visit Plan Continue to address knee  extension ROM, hip strenghtening (hip extensors specifically) and functional quad strength; balance training progression uneven surfaces, etc.   PT Home Exercise Plan no updates this session   Consulted and Agree with Plan of Care Patient      Patient will benefit from skilled therapeutic intervention in order to improve the following deficits and impairments:  Abnormal gait, Hypomobility, Decreased scar mobility, Decreased activity tolerance, Decreased strength, Pain, Decreased balance, Decreased range of motion, Postural dysfunction  Visit Diagnosis: Left knee pain  Localized edema  Difficulty walking  S/P left knee arthroscopy  Knee stiffness, left     Problem List Patient Active Problem List   Diagnosis Date Noted  . Medial meniscus, posterior horn derangement   . Arthritis of knee   . Primary osteoarthritis of left knee 04/05/2014  . Diastolic dysfunction    87:56 AM,09/01/2015 Elly Modena PT, DPT [facility] [place] [address] [place], [place], [address] Phone: [phone]   Fax:  [phone]  Name: MAURISA TESMER MRN: [id number] Date of Birth: Oct 12, 1940

## 2015-09-04 ENCOUNTER — Ambulatory Visit (HOSPITAL_COMMUNITY): Payer: Medicare Other | Admitting: Physical Therapy

## 2015-09-04 DIAGNOSIS — R2242 Localized swelling, mass and lump, left lower limb: Secondary | ICD-10-CM | POA: Diagnosis not present

## 2015-09-04 DIAGNOSIS — R262 Difficulty in walking, not elsewhere classified: Secondary | ICD-10-CM

## 2015-09-04 DIAGNOSIS — M25562 Pain in left knee: Secondary | ICD-10-CM

## 2015-09-04 DIAGNOSIS — Z9889 Other specified postprocedural states: Secondary | ICD-10-CM | POA: Diagnosis not present

## 2015-09-04 DIAGNOSIS — M25561 Pain in right knee: Secondary | ICD-10-CM | POA: Diagnosis not present

## 2015-09-04 DIAGNOSIS — M1711 Unilateral primary osteoarthritis, right knee: Secondary | ICD-10-CM | POA: Diagnosis not present

## 2015-09-04 DIAGNOSIS — R600 Localized edema: Secondary | ICD-10-CM | POA: Diagnosis not present

## 2015-09-04 DIAGNOSIS — F411 Generalized anxiety disorder: Secondary | ICD-10-CM | POA: Diagnosis not present

## 2015-09-04 DIAGNOSIS — M25662 Stiffness of left knee, not elsewhere classified: Secondary | ICD-10-CM | POA: Diagnosis not present

## 2015-09-04 NOTE — Therapy (Signed)
[place], [place], [address] Phone: [phone]   Fax:  [phone]  Physical Therapy Treatment  Patient Details  Name: Laura Carter MRN: [id number] Date of Birth: [birth year] Referring Provider: Arther Abbott, MD  Encounter Date: 09/04/2015      PT End of Session - 09/04/15 1038    Visit Number 28   Number of Visits 32   Date for PT Re-Evaluation 09/11/15   Authorization Type Medicare/BCBS Supplemental (G-code done 21st session)   Authorization Time Period 06/21/15 to 08/19/15; 08/14/2015- 09/25/2015   Authorization - Visit Number 28   Authorization - Number of Visits 31   PT Start Time 0945   PT Stop Time 1030   PT Time Calculation (min) 45 min   Equipment Utilized During Treatment Gait belt   Activity Tolerance Patient tolerated treatment well   Behavior During Therapy [facility] for tasks assessed/performed      Past Medical History  Diagnosis Date  . Diastolic dysfunction   . Hypertension   . Asthma   . GERD (gastroesophageal reflux disease)     Past Surgical History  Procedure Laterality Date  . Cholecystectomy  10/10  . Knee arthroscopy with medial menisectomy Left 06/16/2015    Procedure: KNEE ARTHROSCOPY WITH PARTIAL MEDIAL MENISECTOMY;  Surgeon: Carole Civil, MD;  Location: [facility];  Service: Orthopedics;  Laterality: Left;    There were no vitals filed for this visit.      Subjective Assessment - 09/04/15 0951    Subjective Pt notes she was doing much better this past weekend after her last session, but continues to have stiffness along the lateral side of her L knee. She didn't have difficulty walking around and staying busy. She is going to get another injection today.   Pertinent History Patient reports that her knee pain has gone on for awhile; she saw Dr. [name] the fall of 2016, and was told that she had arthritis and a possible meniscus tear. Knee just kept progressing and she  began having constant pain late last year. Had arthoscopy performed on Friday the 20th.    Currently in Pain? No/denies                         [facility] Adult PT Treatment/Exercise - 09/04/15 0001    Knee/Hip Exercises: Stretches   Quad Stretch Left;3 reps;30 seconds   Knee/Hip Exercises: Aerobic   Nustep x5 min L1   Knee/Hip Exercises: Standing   Wall Squat 2 sets;10 seconds   Manual Therapy   Manual Therapy Joint mobilization;Soft tissue mobilization;Passive ROM   Manual therapy comments performed separately from all other interventions   Joint Mobilization [place] I-III tibiofemoral jt mobs in prone   Soft tissue mobilization trigger point release to Lt popliteus; Palmar spreading and rolling to Lt distal quad                 PT Education - 09/04/15 1036    Education provided Yes   Education Details implications for manual treatment; reviewed rolling techniques for distal quad/hamstring/ITB; updated HEP   Person(s) Educated Patient   Methods Explanation;Demonstration;Handout   Comprehension Verbalized understanding;Returned demonstration          PT Short Term Goals - 08/14/15 1009    PT SHORT TERM GOAL #1   Title Patient to demonstrate ROM 0-120 degrees L knee in order to improve overall mechanics and enhance mobiltiy    Baseline 2/20-  8-132; patient reports she had never been able to get L knee completely straight even before surgery; 08/14/15: 10-130 deg   Time 3   Period Weeks   Status Partially Met   PT SHORT TERM GOAL #2   Title Patient to be independent in at least 3 edema/pain management techniques, including but not limited to icing, exercise/stretching, retro-grade massage in roder to increase independence in managing condition    Baseline 2/20- has been doing some but not a lot    Time 3   Period Weeks   Status Achieved   PT SHORT TERM GOAL #3   Title Paitent to be able to ambulate unlimited distances with no assistive device, improved gait  mechanics including equal stance/step lengths/times, improved gait speed, and full L knee ROM in order to enhance mobilty    Baseline 2/20- patient is still taking cane with her but does not use it a lot; however does have a bit of trouble with mechanics the further she walks; 08/14/15: Pt not using SPC recently, but occasionally will use when her LLE feels weak.     Time 3   Period Weeks   Status Partially Met   PT SHORT TERM GOAL #4   Title Patient to be indpendent in correctly and consistently performing appropriate HEP, to be updated PRN    Baseline 2/20- doing everything throughout the day    Time 3   Period Weeks   Status Achieved           PT Long Term Goals - 08/14/15 1010    PT LONG TERM GOAL #1   Title Patient to demonstrate strength 5/5 in all tested muscle groups in order to reduce pain, enhance regional stability, and improve gait    Baseline 08/14/15: All but hip extension, 5/5   Time 6   Period Weeks   Status Partially Met   PT LONG TERM GOAL #2   Title Patient to report she has been able to return to yoga with L knee pain no more than 2/10 and with no ROM based limiations that prevent her from particicpating in program in order to assist in returning to PLOF    Baseline 2/20- has not tried yet but reports she may get private lessons to start off; 08/14/15: pt started back to yoga last wednesday, however she is unable to perform alll of the acitivities secondary to ROM limitations.    Time 6   Period Weeks   Status Partially Met   PT LONG TERM GOAL #3   Title Patient to be able to ascend/descend full flight of stairs with reciprocal pattern, no railings, minimal unsteadiness, good eccentric control in order to improve overall mobilty at home and in community    Baseline 2/20- stairs is still a major difficulty for patient 08/14/15: poor eccentric control with LLE during ascend; requires handrails for steadiness during ascend/descend.    Time 6   Period Weeks   Status  On-going   PT LONG TERM GOAL #4   Title Patient to be able to perform TUG in 8 seconds and 5x sit to stand in 12 seconds or less in order to demonstrate improved balance and muscle power for more independence in mobiltyi    Baseline 2/20- TUG 12 seconds and 5x sit to stand in 11.23 seconds; 08/14/15: Sit to stand: 8 sec, TUG: 9.3 sec   Time 6   Period Weeks   Status Partially Met  Plan - 09/04/15 1039    Clinical Impression Statement This session focused on manual techniques to improve soft tissue restrictions and stiffness as reported by pt during activity. Pt responded well to activity with improved report of stiffness and ability to walk. Therapist reviewed rolling techniques and stretches for pt to perform while on vacation with pt verbalizing understanding. Will continue with current POC.    Rehab Potential Excellent   Clinical Impairments Affecting Rehab Potential respiratory limitations- chronic bronchitis and asthma    PT Frequency 2x / week   PT Duration 4 weeks   PT Treatment/Interventions ADLs/Self Care Home Management;Cryotherapy;Gait training;DME Instruction;Stair training;Functional mobility training;Therapeutic activities;Therapeutic exercise;Balance training;Neuromuscular re-education;Patient/family education;Manual techniques;Scar mobilization;Moist Heat;Biofeedback;Aquatic Therapy;Passive range of motion;Taping   PT Next Visit Plan Continue to address hip strengthening (hip extensors specifically) and functional quad strength; balance training progression uneven surfaces, etc.   PT Home Exercise Plan updated with rolling distal quad/hamstring/ITB and prone quad stretch   Consulted and Agree with Plan of Care Patient      Patient will benefit from skilled therapeutic intervention in order to improve the following deficits and impairments:  Abnormal gait, Hypomobility, Decreased scar mobility, Decreased activity tolerance, Decreased strength, Pain, Decreased  balance, Decreased range of motion, Postural dysfunction  Visit Diagnosis: Pain in left knee - Plan: PT plan of care cert/re-cert  Localized swelling, mass and lump, left lower limb - Plan: PT plan of care cert/re-cert  Difficulty in walking, not elsewhere classified - Plan: PT plan of care cert/re-cert  Stiffness of left knee, not elsewhere classified - Plan: PT plan of care cert/re-cert     Problem List Patient Active Problem List   Diagnosis Date Noted  . Medial meniscus, posterior horn derangement   . Arthritis of knee   . Primary osteoarthritis of left knee 04/05/2014  . Diastolic dysfunction    24:00 AM,09/04/2015 Elly Modena PT, DPT [facility] [place] [address] [place], [place], [address] Phone: [phone]   Fax:  [phone]  Name: SIONA COULSTON MRN: [id number] Date of Birth: 27-Mar-1941

## 2015-09-06 DIAGNOSIS — M1712 Unilateral primary osteoarthritis, left knee: Secondary | ICD-10-CM | POA: Diagnosis not present

## 2015-09-06 DIAGNOSIS — M25562 Pain in left knee: Secondary | ICD-10-CM | POA: Diagnosis not present

## 2015-09-07 ENCOUNTER — Ambulatory Visit (HOSPITAL_COMMUNITY): Payer: Medicare Other | Admitting: Physical Therapy

## 2015-09-07 DIAGNOSIS — M25562 Pain in left knee: Secondary | ICD-10-CM

## 2015-09-07 DIAGNOSIS — R262 Difficulty in walking, not elsewhere classified: Secondary | ICD-10-CM | POA: Diagnosis not present

## 2015-09-07 DIAGNOSIS — R2242 Localized swelling, mass and lump, left lower limb: Secondary | ICD-10-CM

## 2015-09-07 DIAGNOSIS — Z9889 Other specified postprocedural states: Secondary | ICD-10-CM | POA: Diagnosis not present

## 2015-09-07 DIAGNOSIS — R600 Localized edema: Secondary | ICD-10-CM | POA: Diagnosis not present

## 2015-09-07 DIAGNOSIS — M25662 Stiffness of left knee, not elsewhere classified: Secondary | ICD-10-CM

## 2015-09-07 NOTE — Therapy (Signed)
[facility] [address] [place], [place], [address] Phone: [phone]   Fax:  [phone]  Physical Therapy Treatment  Patient Details  Name: Laura Carter MRN: [id number] Date of Birth: December 18, 1940 Referring Provider: Arther Abbott, MD  Encounter Date: 09/07/2015      PT End of Session - 09/07/15 1205    Visit Number (p) 29   Number of Visits (p) 32   Date for PT Re-Evaluation (p) 09/11/15   Authorization Type (p) Medicare/BCBS Supplemental (G-code done 21st session)   Authorization Time Period (p) 06/21/15 to 08/19/15; 08/14/2015- 09/25/2015   Authorization - Visit Number (p) 29   Authorization - Number of Visits (p) 31   PT Start Time (p) 0945   PT Stop Time (p) 1030   PT Time Calculation (min) (p) 45 min   Equipment Utilized During Treatment (p) Gait belt   Activity Tolerance (p) Patient tolerated treatment well   Behavior During Therapy (p) WFL for tasks assessed/performed      Past Medical History  Diagnosis Date  . Diastolic dysfunction   . Hypertension   . Asthma   . GERD (gastroesophageal reflux disease)     Past Surgical History  Procedure Laterality Date  . Cholecystectomy  10/10  . Knee arthroscopy with medial menisectomy Left 06/16/2015    Procedure: KNEE ARTHROSCOPY WITH PARTIAL MEDIAL MENISECTOMY;  Surgeon: Carole Civil, MD;  Location: [facility];  Service: Orthopedics;  Laterality: Left;    There were no vitals filed for this visit.      Subjective Assessment - 09/07/15 1201    Subjective Pt feels she is doing good, but is somewhat discouraged that she is beginning to have a catching sensation with activity. She has been going to flexogenics for 4 weeks so far. she feels her balance is improving but she continues to have some difficulty with that and walking long distances.    Pertinent History Patient reports that her knee pain has gone on for awhile; she saw Dr. [name] the fall of 2016, and was told  that she had arthritis and a possible meniscus tear. Knee just kept progressing and she began having constant pain late last year. Had arthoscopy performed on Friday the 20th.    Limitations Other (comment)   How long can you sit comfortably? unlimited   How long can you stand comfortably? unsure   How long can you walk comfortably? 20 minutes   Patient Stated Goals improved balance and strength   Currently in Pain? No/denies            [facility] PT Assessment - 09/07/15 0001    Assessment   Medical Diagnosis s/p L knee arthoscopy    Referring Provider Arther Abbott, MD   Onset Date/Surgical Date 06/16/15   Next MD Visit 09/18/15  d/c   Precautions   Precautions None   Restrictions   Weight Bearing Restrictions No   Balance Screen   Has the patient fallen in the past 6 months No   Has the patient had a decrease in activity level because of a fear of falling?  No   Is the patient reluctant to leave their home because of a fear of falling?  No   Prior Function   Level of Independence Independent;Independent with basic ADLs;Independent with gait;Independent with transfers   Vocation Retired   Leisure yoga, massages, walking    AROM   Left Knee Extension 0   Left Knee Flexion 130  Strength   Right Hip Flexion 5/5   Right Hip Extension 3+/5   Right Hip ABduction 5/5   Left Hip Flexion 5/5   Left Hip Extension 3+/5   Left Hip ABduction 4+/5   Right Knee Flexion 5/5   Right Knee Extension 5/5   Left Knee Flexion 5/5   Left Knee Extension 5/5   Right Ankle Dorsiflexion 5/5   Left Ankle Dorsiflexion 5/5   Transfers   Five time sit to stand comments  8.8sec   6 minute walk test results    Aerobic Endurance Distance Walked --   Endurance additional comments --   High Level Balance   High Level Balance Comments TUG 8.9 without AD                     [facility] Adult PT Treatment/Exercise - 09/07/15 0001    Ambulation/Gait   Stairs Yes   Stairs Assistance 7:  Independent   Stair Management Technique Alternating pattern  cuing from therapist for continuous alternating pattern   Number of Stairs 5   Gait Comments reduced gait speed             Balance Exercises - 09/07/15 1202    Balance Exercises: Standing   Tandem Stance Eyes open;2 reps;10 secs   SLS Eyes open;Solid surface;5 reps  max 5 sec            PT Education - 09/07/15 1204    Education provided Yes   Education Details discussed goals and POC; correct sequencing with stairs   Person(s) Educated Patient   Methods Explanation   Comprehension Verbalized understanding;Returned demonstration          PT Short Term Goals - 09/07/15 1016    PT SHORT TERM GOAL #1   Title Patient to demonstrate ROM 0-120 degrees L knee in order to improve overall mechanics and enhance mobiltiy    Baseline 2/20- 8-132; patient reports she had never been able to get L knee completely straight even before surgery; 08/14/15: 10-130 deg; 0 deg extension   Time 3   Period Weeks   Status Achieved   PT SHORT TERM GOAL #2   Title Patient to be independent in at least 3 edema/pain management techniques, including but not limited to icing, exercise/stretching, retro-grade massage in roder to increase independence in managing condition    Baseline 2/20- has been doing some but not a lot    Time 3   Period Weeks   Status Achieved   PT SHORT TERM GOAL #3   Title Paitent to be able to ambulate unlimited distances with no assistive device, improved gait mechanics including equal stance/step lengths/times, improved gait speed, and full L knee ROM in order to enhance mobilty    Baseline 2/20- patient is still taking cane with her but does not use it a lot; however does have a bit of trouble with mechanics the further she walks; 08/14/15: Pt not using SPC recently, but occasionally will use when her LLE feels weak.; discomfort noted only with knee catching   Time 3   Period Weeks   Status Achieved   PT  SHORT TERM GOAL #4   Title Patient to be indpendent in correctly and consistently performing appropriate HEP, to be updated PRN    Baseline 2/20- doing everything throughout the day;     Time 3   Period Weeks   Status Achieved           PT Long  Term Goals - 09/07/15 1018    PT LONG TERM GOAL #1   Title Patient to demonstrate strength 5/5 in all tested muscle groups in order to reduce pain, enhance regional stability, and improve gait    Baseline 08/14/15: All but hip extension, 5/5; 4-/5 hip extension   Time 6   Period Weeks   Status Partially Met   PT LONG TERM GOAL #2   Title Patient to report she has been able to return to yoga with L knee pain no more than 2/10 and with no ROM based limiations that prevent her from particicpating in program in order to assist in returning to PLOF    Baseline 2/20- has not tried yet but reports she may get private lessons to start off; 08/14/15: pt started back to yoga last wednesday, however she is unable to perform alll of the acitivities secondary to ROM limitations.    Time 6   Period Weeks   Status Achieved   PT LONG TERM GOAL #3   Title Patient to be able to ascend/descend full flight of stairs with reciprocal pattern, no railings, minimal unsteadiness, good eccentric control in order to improve overall mobilty at home and in community    Baseline 2/20- stairs is still a major difficulty for patient 08/14/15: poor eccentric control with LLE during ascend; requires handrails for steadiness during ascend/descend.    Time 6   Period Weeks   Status On-going   PT LONG TERM GOAL #4   Title Patient to be able to perform TUG in 8 seconds and 5x sit to stand in 12 seconds or less in order to demonstrate improved balance and muscle power for more independence in mobiltyi    Baseline 2/20- TUG 12 seconds and 5x sit to stand in 11.23 seconds; 08/14/15: Sit to stand: 8 sec, TUG: 9.3 sec; 09/07/15: TUG 8.5 sec   Time 6   Period Weeks   Status Achieved                Plan - 09/07/15 1225    Clinical Impression Statement Pt was reassessed this session with noted improvements in strength, ROM, mobility and overall pain report. She has met most of her updated goals this period and is progressing towards the others. Her remaining strength deficits remain in B hip extension. Knee ROM is improved to 0-130 degrees at this time, with pt reporting improved ability to walk and perform other ADLs. Her TUG and 5x sit/stand are within acceptable limits putting her at decreased risk of falls in the community. She is currently performing her HEP consistently without difficulty and is regularly participating in yoga within her tolerance. At this time, her remaining limitations include balance and occasional pain report which is likely due to structural changes of the knee. Discussed POC and goals with pt and possible d/c next session if no new issues arise.  Will finalize home care plan at this time.    Rehab Potential Excellent   Clinical Impairments Affecting Rehab Potential respiratory limitations- chronic bronchitis and asthma    PT Frequency 2x / week   PT Duration 4 weeks   PT Treatment/Interventions ADLs/Self Care Home Management;Cryotherapy;Gait training;DME Instruction;Stair training;Functional mobility training;Therapeutic activities;Therapeutic exercise;Balance training;Neuromuscular re-education;Patient/family education;Manual techniques;Scar mobilization;Moist Heat;Biofeedback;Aquatic Therapy;Passive range of motion;Taping   PT Next Visit Plan d/c if no new issues.   PT Home Exercise Plan finalized HEP   Consulted and Agree with Plan of Care Patient      Patient will benefit from  skilled therapeutic intervention in order to improve the following deficits and impairments:  Abnormal gait, Hypomobility, Decreased scar mobility, Decreased activity tolerance, Decreased strength, Pain, Decreased balance, Decreased range of motion, Postural  dysfunction  Visit Diagnosis: Pain in left knee  Localized swelling, mass and lump, left lower limb  Difficulty in walking, not elsewhere classified  Stiffness of left knee, not elsewhere classified     Problem List Patient Active Problem List   Diagnosis Date Noted  . Medial meniscus, posterior horn derangement   . Arthritis of knee   . Primary osteoarthritis of left knee 04/05/2014  . Diastolic dysfunction     14:97 PM,09/07/2015 Elly Modena PT, DPT [facility] [address] [place], [place], [address] Phone: [phone]   Fax:  [phone]  Name: JORDANNE ELSBURY MRN: [id number] Date of Birth: [birth year]

## 2015-09-11 ENCOUNTER — Encounter (HOSPITAL_COMMUNITY): Payer: Medicare Other | Admitting: Physical Therapy

## 2015-09-12 DIAGNOSIS — M25561 Pain in right knee: Secondary | ICD-10-CM | POA: Diagnosis not present

## 2015-09-12 DIAGNOSIS — M1711 Unilateral primary osteoarthritis, right knee: Secondary | ICD-10-CM | POA: Diagnosis not present

## 2015-09-14 ENCOUNTER — Encounter (HOSPITAL_COMMUNITY): Payer: Medicare Other | Admitting: Physical Therapy

## 2015-09-18 ENCOUNTER — Encounter: Payer: Self-pay | Admitting: Orthopedic Surgery

## 2015-09-18 ENCOUNTER — Ambulatory Visit (INDEPENDENT_AMBULATORY_CARE_PROVIDER_SITE_OTHER): Payer: Medicare Other | Admitting: Orthopedic Surgery

## 2015-09-18 ENCOUNTER — Ambulatory Visit (HOSPITAL_COMMUNITY): Payer: Medicare Other | Admitting: Physical Therapy

## 2015-09-18 VITALS — BP 166/79 | Ht 65.0 in | Wt 177.0 lb

## 2015-09-18 DIAGNOSIS — Z9889 Other specified postprocedural states: Secondary | ICD-10-CM | POA: Diagnosis not present

## 2015-09-18 DIAGNOSIS — R262 Difficulty in walking, not elsewhere classified: Secondary | ICD-10-CM | POA: Diagnosis not present

## 2015-09-18 DIAGNOSIS — R2242 Localized swelling, mass and lump, left lower limb: Secondary | ICD-10-CM

## 2015-09-18 DIAGNOSIS — R600 Localized edema: Secondary | ICD-10-CM | POA: Diagnosis not present

## 2015-09-18 DIAGNOSIS — M25662 Stiffness of left knee, not elsewhere classified: Secondary | ICD-10-CM | POA: Diagnosis not present

## 2015-09-18 DIAGNOSIS — M25562 Pain in left knee: Secondary | ICD-10-CM

## 2015-09-18 DIAGNOSIS — M1712 Unilateral primary osteoarthritis, left knee: Secondary | ICD-10-CM | POA: Diagnosis not present

## 2015-09-18 NOTE — Therapy (Signed)
[place], [place], [address] Phone: [phone]   Fax:  [phone]  Physical Therapy Treatment  Patient Details  Name: Laura Carter MRN: [id number] Date of Birth: 1941-05-22 Referring Provider: Arther Abbott, MD  Encounter Date: 09/18/2015      PT End of Session - 09/18/15 2121    Visit Number 30   Number of Visits 32   Date for PT Re-Evaluation 09/25/15   Authorization Type Medicare/BCBS Supplemental (G-code done 21st session)   Authorization Time Period 06/21/15 to 08/19/15; 08/14/2015- 09/25/2015   Authorization - Visit Number 33   Authorization - Number of Visits 31   PT Start Time 3790   PT Stop Time 1430   PT Time Calculation (min) 45 min   Equipment Utilized During Treatment Gait belt   Activity Tolerance Patient tolerated treatment well   Behavior During Therapy [facility] for tasks assessed/performed      Past Medical History  Diagnosis Date  . Diastolic dysfunction   . Hypertension   . Asthma   . GERD (gastroesophageal reflux disease)     Past Surgical History  Procedure Laterality Date  . Cholecystectomy  10/10  . Knee arthroscopy with medial menisectomy Left 06/16/2015    Procedure: KNEE ARTHROSCOPY WITH PARTIAL MEDIAL MENISECTOMY;  Surgeon: Carole Civil, MD;  Location: [facility];  Service: Orthopedics;  Laterality: Left;    There were no vitals filed for this visit.      Subjective Assessment - 09/18/15 1349    Subjective Pt states she is doing good and worked on her balance and steps yesterday. She reports that her husband feels her balance is not good, but she feels it is just her leg getting tired. Has occasional "catching" in her knee but no pain.   Pertinent History Patient reports that her knee pain has gone on for awhile; she saw Dr. [name] the fall of 2016, and was told that she had arthritis and a possible meniscus tear. Knee just kept progressing and she began having constant  pain late last year. Had arthoscopy performed on Friday the 20th.    Patient Stated Goals improved balance and strength   Currently in Pain? No/denies            [facility] PT Assessment - 09/18/15 0001    Balance   Balance Assessed Yes   Standardized Balance Assessment   Standardized Balance Assessment Berg Balance Test   Berg Balance Test   Sit to Stand Able to stand without using hands and stabilize independently   Standing Unsupported Able to stand safely 2 minutes   Sitting with Back Unsupported but Feet Supported on Floor or Stool Able to sit safely and securely 2 minutes   Stand to Sit Sits safely with minimal use of hands   Transfers Able to transfer safely, minor use of hands   Standing Unsupported with Eyes Closed Able to stand 10 seconds safely   Standing Ubsupported with Feet Together Able to place feet together independently and stand 1 minute safely   From Standing, Reach Forward with Outstretched Arm Can reach forward >12 cm safely (5")   From Standing Position, Pick up Object from Floor Able to pick up shoe safely and easily   From Standing Position, Turn to Look Behind Over each Shoulder Looks behind from both sides and weight shifts well   Turn 360 Degrees Able to turn 360 degrees safely in 4 seconds or less   Standing Unsupported, Alternately  Place Feet on Step/Stool Able to stand independently and safely and complete 8 steps in 20 seconds   Standing Unsupported, One Foot in [place] to place foot tandem independently and hold 30 seconds   Standing on One Leg Able to lift leg independently and hold 5-10 seconds   Total Score 54                          Balance Exercises - 09/18/15 1435    Balance Exercises: Standing   Tandem Stance Eyes open;10 secs;Foam/compliant surface;3 reps  max 10 sec   SLS Eyes open;Solid surface;5 reps  x7 sec max   Other Standing Exercises warrior pose 3x20sec each           PT Education - 09/18/15 2120     Education provided Yes   Education Details discussed progress and POC; updated HEP; discussed pt's improved balance and Berg Balance score; test strip of K-tape applied to knee and reviewed possible signs of irritation   Person(s) Educated Patient   Methods Explanation;Demonstration   Comprehension Verbalized understanding;Returned demonstration          PT Short Term Goals - 09/07/15 1016    PT SHORT TERM GOAL #1   Title Patient to demonstrate ROM 0-120 degrees L knee in order to improve overall mechanics and enhance mobiltiy    Baseline 2/20- 8-132; patient reports she had never been able to get L knee completely straight even before surgery; 08/14/15: 10-130 deg; 0 deg extension   Time 3   Period Weeks   Status Achieved   PT SHORT TERM GOAL #2   Title Patient to be independent in at least 3 edema/pain management techniques, including but not limited to icing, exercise/stretching, retro-grade massage in roder to increase independence in managing condition    Baseline 2/20- has been doing some but not a lot    Time 3   Period Weeks   Status Achieved   PT SHORT TERM GOAL #3   Title Paitent to be able to ambulate unlimited distances with no assistive device, improved gait mechanics including equal stance/step lengths/times, improved gait speed, and full L knee ROM in order to enhance mobilty    Baseline 2/20- patient is still taking cane with her but does not use it a lot; however does have a bit of trouble with mechanics the further she walks; 08/14/15: Pt not using SPC recently, but occasionally will use when her LLE feels weak.; discomfort noted only with knee catching   Time 3   Period Weeks   Status Achieved   PT SHORT TERM GOAL #4   Title Patient to be indpendent in correctly and consistently performing appropriate HEP, to be updated PRN    Baseline 2/20- doing everything throughout the day;     Time 3   Period Weeks   Status Achieved           PT Long Term Goals -  09/07/15 1018    PT LONG TERM GOAL #1   Title Patient to demonstrate strength 5/5 in all tested muscle groups in order to reduce pain, enhance regional stability, and improve gait    Baseline 08/14/15: All but hip extension, 5/5; 4-/5 hip extension   Time 6   Period Weeks   Status Partially Met   PT LONG TERM GOAL #2   Title Patient to report she has been able to return to yoga with L knee pain no more than  2/10 and with no ROM based limiations that prevent her from particicpating in program in order to assist in returning to PLOF    Baseline 2/20- has not tried yet but reports she may get private lessons to start off; 08/14/15: pt started back to yoga last wednesday, however she is unable to perform alll of the acitivities secondary to ROM limitations.    Time 6   Period Weeks   Status Achieved   PT LONG TERM GOAL #3   Title Patient to be able to ascend/descend full flight of stairs with reciprocal pattern, no railings, minimal unsteadiness, good eccentric control in order to improve overall mobilty at home and in community    Baseline 2/20- stairs is still a major difficulty for patient 08/14/15: poor eccentric control with LLE during ascend; requires handrails for steadiness during ascend/descend.    Time 6   Period Weeks   Status On-going   PT LONG TERM GOAL #4   Title Patient to be able to perform TUG in 8 seconds and 5x sit to stand in 12 seconds or less in order to demonstrate improved balance and muscle power for more independence in mobiltyi    Baseline 2/20- TUG 12 seconds and 5x sit to stand in 11.23 seconds; 08/14/15: Sit to stand: 8 sec, TUG: 9.3 sec; 09/07/15: TUG 8.5 sec   Time 6   Period Weeks   Status Achieved               Plan - 09/18/15 2121    Clinical Impression Statement Today's session focused on pt's balance and performance on the Berg Balance test. She performed well with a score of 54/56 indicating she is at a decreased risk of falls in the community.  Therapist noting slight asymmetry with SLS between the L and R LE and she was provided with several activities for home to improve this. Anticipate d/c next visit if no new concerns/issues arise.    Rehab Potential Excellent   Clinical Impairments Affecting Rehab Potential respiratory limitations- chronic bronchitis and asthma    PT Frequency 2x / week   PT Duration 4 weeks   PT Treatment/Interventions ADLs/Self Care Home Management;Cryotherapy;Gait training;DME Instruction;Stair training;Functional mobility training;Therapeutic activities;Therapeutic exercise;Balance training;Neuromuscular re-education;Patient/family education;Manual techniques;Scar mobilization;Moist Heat;Biofeedback;Aquatic Therapy;Passive range of motion;Taping   PT Next Visit Plan d/c if no new issues.   PT Home Exercise Plan finalized HEP   Consulted and Agree with Plan of Care Patient      Patient will benefit from skilled therapeutic intervention in order to improve the following deficits and impairments:  Abnormal gait, Hypomobility, Decreased scar mobility, Decreased activity tolerance, Decreased strength, Pain, Decreased balance, Decreased range of motion, Postural dysfunction  Visit Diagnosis: Pain in left knee  Localized swelling, mass and lump, left lower limb  Difficulty in walking, not elsewhere classified  Stiffness of left knee, not elsewhere classified     Problem List Patient Active Problem List   Diagnosis Date Noted  . Medial meniscus, posterior horn derangement   . Arthritis of knee   . Primary osteoarthritis of left knee 04/05/2014  . Diastolic dysfunction    1:63 PM,09/18/2015 Elly Modena PT, DPT [facility] [address] [place], [place], [address] Phone: [phone]   Fax:  [phone]  Name: Laura Carter MRN: [id number] Date of Birth: 02-02-1941

## 2015-09-18 NOTE — Progress Notes (Signed)
Patient ID: Laura Carter, female   DOB: [birth year], 75 y.o.   MRN: [id number]  Chief Complaint  Patient presents with  . Follow-up    follow up ,left knee arthroscopy,06/16/15   94 days post op   HPI -Status post left knee arthroscopy had some residual hamstring issues which have now been worked up with therapy  Flexogenics-gave her 2 sets of hyaluronic injections and she feels better   ROS occasional swelling after increased activity  BP 166/79 mmHg  Ht 5\' 5"  (1.651 m)  Wt 177 lb (80.287 kg)  BMI 29.45 kg/m2  Physical Exam  Constitutional: She is oriented to person, place, and time. She appears well-developed and well-nourished. No distress.  Cardiovascular: Normal rate and intact distal pulses.   Neurological: She is alert and oriented to person, place, and time. She has normal reflexes. She exhibits normal muscle tone. Coordination normal.  Skin: Skin is warm and dry. No rash noted. She is not diaphoretic. No erythema. No pallor.  Psychiatric: She has a normal mood and affect. Her behavior is normal. Judgment and thought content normal.    Ortho Exam  Full extension and flexion left knee no effusion no pain tenderness swelling or crepitance extension strength normal  ASSESSMENT AND PLAN   Follow-up in 3-4 months

## 2015-09-19 DIAGNOSIS — M25562 Pain in left knee: Secondary | ICD-10-CM | POA: Diagnosis not present

## 2015-09-19 DIAGNOSIS — M170 Bilateral primary osteoarthritis of knee: Secondary | ICD-10-CM | POA: Diagnosis not present

## 2015-09-19 DIAGNOSIS — M25561 Pain in right knee: Secondary | ICD-10-CM | POA: Diagnosis not present

## 2015-09-21 ENCOUNTER — Ambulatory Visit (HOSPITAL_COMMUNITY): Payer: Medicare Other | Admitting: Physical Therapy

## 2015-09-21 ENCOUNTER — Encounter (HOSPITAL_COMMUNITY): Payer: Medicare Other | Admitting: Physical Therapy

## 2015-09-21 DIAGNOSIS — G8929 Other chronic pain: Secondary | ICD-10-CM

## 2015-09-21 DIAGNOSIS — R2242 Localized swelling, mass and lump, left lower limb: Secondary | ICD-10-CM | POA: Diagnosis not present

## 2015-09-21 DIAGNOSIS — R262 Difficulty in walking, not elsewhere classified: Secondary | ICD-10-CM | POA: Diagnosis not present

## 2015-09-21 DIAGNOSIS — M25662 Stiffness of left knee, not elsewhere classified: Secondary | ICD-10-CM | POA: Diagnosis not present

## 2015-09-21 DIAGNOSIS — R600 Localized edema: Secondary | ICD-10-CM | POA: Diagnosis not present

## 2015-09-21 DIAGNOSIS — M25562 Pain in left knee: Secondary | ICD-10-CM | POA: Diagnosis not present

## 2015-09-21 DIAGNOSIS — Z9889 Other specified postprocedural states: Secondary | ICD-10-CM | POA: Diagnosis not present

## 2015-09-21 NOTE — Therapy (Addendum)
[place], [place], [address] Phone: [phone]   Fax:  [phone]  Physical Therapy Treatment/Discharge  Patient Details  Name: Laura Carter MRN: [id number] Date of Birth: 1941-01-05 Referring Provider: Arther Abbott, MD  Encounter Date: 09/21/2015    Past Medical History  Diagnosis Date  . Diastolic dysfunction   . Hypertension   . Asthma   . GERD (gastroesophageal reflux disease)     Past Surgical History  Procedure Laterality Date  . Cholecystectomy  10/10  . Knee arthroscopy with medial menisectomy Left 06/16/2015    Procedure: KNEE ARTHROSCOPY WITH PARTIAL MEDIAL MENISECTOMY;  Surgeon: Carole Civil, MD;  Location: [facility];  Service: Orthopedics;  Laterality: Left;    There were no vitals filed for this visit.   Time in: 1602 Time out: 1645 Visit 31 of 32       Subjective Assessment - 09/21/15 1609    Subjective Pt states she is in "great shape" and feels she knows what to do to improve her strength, balance and flexibility. She went to yoga last night and did fine. She is considering getting a knee brace for community ambulation until her strength improves.   Pertinent History Patient reports that her knee pain has gone on for awhile; she saw Dr. [name] the fall of 2016, and was told that she had arthritis and a possible meniscus tear. Knee just kept progressing and she began having constant pain late last year. Had arthoscopy performed on Friday the 20th.    How long can you sit comfortably? unlimited   How long can you stand comfortably? unlimited   How long can you walk comfortably? unsure because she doensn't usually need to walk for a long time   Patient Stated Goals improved balance and strength   Currently in Pain? No/denies            [facility] PT Assessment - 09/21/15 0001    Assessment   Medical Diagnosis s/p L knee arthoscopy    Referring Provider Arther Abbott, MD    Onset Date/Surgical Date 06/16/15   Next MD Visit Not for another 6 months  d/c   Precautions   Precautions None   Restrictions   Weight Bearing Restrictions No   Balance Screen   Has the patient fallen in the past 6 months No   Has the patient had a decrease in activity level because of a fear of falling?  No   Is the patient reluctant to leave their home because of a fear of falling?  No   Prior Function   Level of Independence Independent;Independent with basic ADLs;Independent with gait;Independent with transfers   Vocation Retired   Leisure yoga, massages, walking    AROM   Left Knee Extension 0   Left Knee Flexion 130   Strength   Right Hip Flexion 5/5   Right Hip Extension 4/5   Right Hip ABduction 5/5   Left Hip Flexion 5/5   Left Hip Extension 4/5   Left Hip ABduction 5/5   Right Knee Flexion 5/5   Right Knee Extension 5/5   Left Knee Flexion 5/5   Left Knee Extension 5/5   Right Ankle Dorsiflexion 5/5   Left Ankle Dorsiflexion 5/5   Transfers   Five time sit to stand comments  8.8sec   Ambulation/Gait   Stairs Yes   Stairs Assistance 7: Independent   Stair Management Technique Alternating pattern  cuing from therapist for continuous  alternating pattern   Number of Stairs 5   Gait Comments reduced gait speed   High Level Balance   High Level Balance Comments TUG 8.9 without AD                          Balance Exercises - 09/21/15 1652    Balance Exercises: Standing   Tandem Stance Eyes open;3 reps  with trunk rotation 3x5 reps, occasional LOB   SLS Eyes open;Solid surface;Intermittent upper extremity support;2 reps  cone taps   Tandem Gait Forward;Foam/compliant surface;3 reps  3 RT with 3 LOB           PT Education - 09/21/15 1700    Education provided Yes   Education Details reviewed goals, d/c status. reviewed updated HEP; encouraged minimal use of knee brace in community settings for improved proprioception when knee becomes  too fatigued   Person(s) Educated Patient   Methods Explanation;Demonstration;Handout   Comprehension Verbalized understanding;Returned demonstration          PT Short Term Goals - 09/21/15 1808    PT SHORT TERM GOAL #1   Title Patient to demonstrate ROM 0-120 degrees L knee in order to improve overall mechanics and enhance mobiltiy    Baseline 2/20- 8-132; patient reports she had never been able to get L knee completely straight even before surgery; 08/14/15: 10-130 deg; 0 deg extension   Time 3   Period Weeks   Status Achieved   PT SHORT TERM GOAL #2   Title Patient to be independent in at least 3 edema/pain management techniques, including but not limited to icing, exercise/stretching, retro-grade massage in roder to increase independence in managing condition    Baseline 2/20- has been doing some but not a lot    Time 3   Period Weeks   Status Achieved   PT SHORT TERM GOAL #3   Title Paitent to be able to ambulate unlimited distances with no assistive device, improved gait mechanics including equal stance/step lengths/times, improved gait speed, and full L knee ROM in order to enhance mobilty    Baseline 2/20- patient is still taking cane with her but does not use it a lot; however does have a bit of trouble with mechanics the further she walks; 08/14/15: Pt not using SPC recently, but occasionally will use when her LLE feels weak.; discomfort noted only with knee catching   Time 3   Period Weeks   Status Achieved   PT SHORT TERM GOAL #4   Title Patient to be indpendent in correctly and consistently performing appropriate HEP, to be updated PRN    Baseline 2/20- doing everything throughout the day;     Time 3   Period Weeks   Status Achieved           PT Long Term Goals - 09/21/15 1808    PT LONG TERM GOAL #1   Title Patient to demonstrate strength 5/5 in all tested muscle groups in order to reduce pain, enhance regional stability, and improve gait    Baseline 08/14/15:  All but hip extension, 5/5; 4-/5 hip extension   Time 6   Period Weeks   Status Achieved   PT LONG TERM GOAL #2   Title Patient to report she has been able to return to yoga with L knee pain no more than 2/10 and with no ROM based limiations that prevent her from particicpating in program in order to assist in returning to  PLOF    Baseline 2/20- has not tried yet but reports she may get private lessons to start off; 08/14/15: pt started back to yoga last wednesday, however she is unable to perform alll of the acitivities secondary to ROM limitations.    Time 6   Period Weeks   Status Achieved   PT LONG TERM GOAL #3   Title Patient to be able to ascend/descend full flight of stairs with reciprocal pattern, no railings, minimal unsteadiness, good eccentric control in order to improve overall mobilty at home and in community    Baseline 2/20- stairs is still a major difficulty for patient 08/14/15: poor eccentric control with LLE during ascend; requires handrails for steadiness during ascend/descend.    Time 6   Period Weeks   Status Achieved   PT LONG TERM GOAL #4   Title Patient to be able to perform TUG in 8 seconds and 5x sit to stand in 12 seconds or less in order to demonstrate improved balance and muscle power for more independence in mobiltyi    Baseline 2/20- TUG 12 seconds and 5x sit to stand in 11.23 seconds; 08/14/15: Sit to stand: 8 sec, TUG: 9.3 sec; 09/07/15: TUG 8.5 sec   Time 6   Period Weeks   Status Achieved               Plan - 2015/10/17 1801    Clinical Impression Statement Pt has been making great progress lately and was discharged this visit with all goals met. She demonstrates atleast BLE strength of atleast 4/5 MMT, her balance is overall improved evident by her Berg Balance score of 54/56 indicating she is at a low risk of falls and she is currently returned to full activity with only minor limitations noted with uneven surfaces. Therapist reviewed goals and  progress made so far and provided a finalized home management program which will continue to address her Lt knee muscle endurance and balance. She feels comfortable with her current status and is confident with her exercises at home. Pt verbalizing interest in a knee brace for additional support and therapist discussed importance of not relying on brace for all activity but using it for circumstances where the knee is extremely fatigued. Pt no longer benefits from skilled PT services and will continue to improve with compliance with HEP at home.    Rehab Potential Excellent   Clinical Impairments Affecting Rehab Potential respiratory limitations- chronic bronchitis and asthma    PT Frequency 2x / week   PT Duration 4 weeks   PT Treatment/Interventions ADLs/Self Care Home Management;Cryotherapy;Gait training;DME Instruction;Stair training;Functional mobility training;Therapeutic activities;Therapeutic exercise;Balance training;Neuromuscular re-education;Patient/family education;Manual techniques;Scar mobilization;Moist Heat;Biofeedback;Aquatic Therapy;Passive range of motion;Taping   PT Next Visit Plan d/c this visit   PT Home Exercise Plan finalized HEP   Consulted and Agree with Plan of Care Patient      Patient will benefit from skilled therapeutic intervention in order to improve the following deficits and impairments:  Abnormal gait, Hypomobility, Decreased scar mobility, Decreased activity tolerance, Decreased strength, Pain, Decreased balance, Decreased range of motion, Postural dysfunction  Visit Diagnosis: Pain in left knee  Localized swelling, mass and lump, left lower limb  Difficulty in walking, not elsewhere classified  Stiffness of left knee, not elsewhere classified  Left knee pain       G-Codes - 2015-10-17 1809    Functional Assessment Tool Used Clinical judgement based on assessment of functional strength, balance, gait and mobility.    Functional Limitation Mobility:  Walking and moving around       Mobility: Walking and Moving Around Goal Status [phone]) At least 1 percent but less than 20 percent impaired, limited or restricted   Mobility: Walking and Moving Around Discharge Status [phone]) At least 1 percent but less than 20 percent impaired, limited or restricted      Problem List Patient Active Problem List   Diagnosis Date Noted  . Medial meniscus, posterior horn derangement   . Arthritis of knee   . Primary osteoarthritis of left knee 04/05/2014  . Diastolic dysfunction     4:60 PM,09/21/2015 Elly Modena PT, DPT [facility] [place] [address] [place], [place], [address] Phone: [phone]   Fax:  [phone]  Name: Laura Carter MRN: [id number] Date of Birth: 07/21/1940    *Addedum to adjust G-Codes to display goal and discharge codes only  4:55 PM,2016/04/28 Elly Modena PT, DPT [facility] [phone]   *addendum to include time in/out and visit #  11:06 AM,09/13/16 Elly Modena PT, DPT [facility] [phone]

## 2015-09-21 NOTE — Patient Instructions (Signed)
SINGLE LEG BRIDGE - MODIFIED  While lying on your back, raise your buttocks off the floor/bed into a bridge position.    Next straighten a leg so that only one leg is supporting your body. Then, return that leg back to the ground and change to the other side.      Try and maintain your pelvis level the entire time.  **Once the bridge gets easy. 2x10   BRIDGING  While lying on your back, tighten your lower abdominals, squeeze your buttocks and then raise your buttocks off the floor/bed as creating a "Bridge" with your body.  2x10   TOES RAISES - DORSIFLEXION STANDING  In a standing position with your feet on the ground, raise up your forefoot and toes as you bend at your ankle.    x20    STANDING HEEL RAISES  While standing, raise up on your toes as you lift your heels off the ground. x20    WALL SQUATS  Leaning up against a wall or closed door on your back, slide your body downward and then return back to upright position.  A door was used here because it was smoother and had less friction than the wall.   Knees should bend in line with the 2nd toe and not pass the front of the foot. 2x10    SINGLE LEG STANCE - SLS  Stand on one leg and maintain your balance.  Hold as long as you can, during household activities, watching tv, etc.     Pyramid Pose  Legs in Warrior 1 stance (heel to heel alignment with back leg rotated slightly, hips squared.)  Keep spine long and tip forward from pelvis until you feel a stretch, hold for 3-5 breaths maintaining good posture with shoulders back.  Repeat other side.      Warrior One   Starting in [employer] (equal standing with hip feet width apart and neutral spine), gently step one foot back hip width distance about 2-3 feet. Turn the back foot out about 45 degrees for stability.  Keeping the hips square, place your hands forward onto the wall or a chair to help stabilize your core and square the hips. Pull the belly gently  towards the spine and drop the tailbone towards the floor.  When you feel ready, bring the hands to the chest in Prayer Position.  Hold the pose for 10-20 breaths.   Chair Yoga: Warrior 1  From [employer], plant back heel down so that foot is at an angle.  Lift up through arms and abdomen and hold for 3-5 breaths.  Alternatively, perform this pose on a higher surface (plinth) or standing holding the chair for balance as needed.  Whether seated or standing, the front knee should line up directly over the front ankle.    Hold pose for 30sec    Gastrocnemius/Soleus Stretch  Keep back leg straight and heel on floor, lean into wall until a stretch is felt in calf.  Hold for 30 seconds.  Next, keep back leg slightly bent and heel flat on floor.  Lean into wall until stretch is felt in calf.  Hold for 30 seconds.  Repeat on other leg.   Hold 30 sec, 3x    PIRIFORMIS STRETCH  While lying on your back with both knee bent, cross your affected leg on the other knee.   Next, hold your unaffected thigh and pull it up towards your chest until a stretch is felt in the buttock.  2x10    TANDEM STANCE WITH SUPPORT  Stand in front of a chair, table or counter top for support. Then place the heel of one foot so that it is touching the toes of the other foot. Maintain your balance in this position. While brushing teeth, during commercial breaks, washing dishes. Hold as long as you can.  Hold as long as you can without losing balance    Hamstring stretch with foot on stool  While standing, place foot on stool or elevated surface.  Keep the foot of the leg that is being stretched in an upward facing direction and the hip in a neutral position.  Gently lean forward at the hip while keeping the spine in a neutral position.  Keeping the hip in a neutral position stretches the hamstrings globally while placing the hip in an externally rotated position stretches the medial hamstrings and an internally  rotated position stretches the lateral hamstring. Hold 30 sec ,3x

## 2015-09-25 ENCOUNTER — Encounter (HOSPITAL_COMMUNITY): Payer: Medicare Other | Admitting: Physical Therapy

## 2015-09-28 ENCOUNTER — Ambulatory Visit (HOSPITAL_COMMUNITY): Payer: Medicare Other | Admitting: Physical Therapy

## 2015-10-24 DIAGNOSIS — F411 Generalized anxiety disorder: Secondary | ICD-10-CM | POA: Diagnosis not present

## 2015-11-20 ENCOUNTER — Emergency Department (HOSPITAL_COMMUNITY): Payer: No Typology Code available for payment source

## 2015-11-20 ENCOUNTER — Emergency Department (HOSPITAL_COMMUNITY)
Admission: EM | Admit: 2015-11-20 | Discharge: 2015-11-20 | Disposition: A | Discharge status: Home / Self Care | Payer: No Typology Code available for payment source | Attending: Emergency Medicine | Admitting: Emergency Medicine

## 2015-11-20 ENCOUNTER — Telehealth: Payer: Self-pay | Admitting: Orthopedic Surgery

## 2015-11-20 ENCOUNTER — Encounter (HOSPITAL_COMMUNITY): Payer: Self-pay | Admitting: Emergency Medicine

## 2015-11-20 DIAGNOSIS — S199XXA Unspecified injury of neck, initial encounter: Secondary | ICD-10-CM | POA: Diagnosis not present

## 2015-11-20 DIAGNOSIS — J45909 Unspecified asthma, uncomplicated: Secondary | ICD-10-CM | POA: Insufficient documentation

## 2015-11-20 DIAGNOSIS — Y9389 Activity, other specified: Secondary | ICD-10-CM | POA: Diagnosis not present

## 2015-11-20 DIAGNOSIS — Z87891 Personal history of nicotine dependence: Secondary | ICD-10-CM | POA: Diagnosis not present

## 2015-11-20 DIAGNOSIS — M545 Low back pain: Secondary | ICD-10-CM | POA: Diagnosis not present

## 2015-11-20 DIAGNOSIS — S161XXA Strain of muscle, fascia and tendon at neck level, initial encounter: Secondary | ICD-10-CM | POA: Insufficient documentation

## 2015-11-20 DIAGNOSIS — Z79899 Other long term (current) drug therapy: Secondary | ICD-10-CM | POA: Insufficient documentation

## 2015-11-20 DIAGNOSIS — M542 Cervicalgia: Secondary | ICD-10-CM | POA: Diagnosis not present

## 2015-11-20 DIAGNOSIS — Y999 Unspecified external cause status: Secondary | ICD-10-CM | POA: Insufficient documentation

## 2015-11-20 DIAGNOSIS — Y92410 Unspecified street and highway as the place of occurrence of the external cause: Secondary | ICD-10-CM | POA: Diagnosis not present

## 2015-11-20 DIAGNOSIS — S39012A Strain of muscle, fascia and tendon of lower back, initial encounter: Secondary | ICD-10-CM

## 2015-11-20 DIAGNOSIS — Z7982 Long term (current) use of aspirin: Secondary | ICD-10-CM | POA: Insufficient documentation

## 2015-11-20 DIAGNOSIS — I10 Essential (primary) hypertension: Secondary | ICD-10-CM | POA: Diagnosis not present

## 2015-11-20 DIAGNOSIS — Z7951 Long term (current) use of inhaled steroids: Secondary | ICD-10-CM | POA: Insufficient documentation

## 2015-11-20 DIAGNOSIS — M25462 Effusion, left knee: Secondary | ICD-10-CM | POA: Diagnosis not present

## 2015-11-20 DIAGNOSIS — M25562 Pain in left knee: Secondary | ICD-10-CM

## 2015-11-20 DIAGNOSIS — S3992XA Unspecified injury of lower back, initial encounter: Secondary | ICD-10-CM | POA: Diagnosis present

## 2015-11-20 NOTE — Discharge Instructions (Signed)
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
## 2015-11-23 ENCOUNTER — Telehealth: Payer: Self-pay | Admitting: Family Medicine
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
## 2015-11-24 ENCOUNTER — Emergency Department (HOSPITAL_COMMUNITY): Payer: No Typology Code available for payment source

## 2015-11-24 ENCOUNTER — Encounter (HOSPITAL_COMMUNITY): Payer: Self-pay | Admitting: Emergency Medicine

## 2015-11-24 ENCOUNTER — Emergency Department (HOSPITAL_COMMUNITY)
Admission: EM | Admit: 2015-11-24 | Discharge: 2015-11-24 | Disposition: A | Discharge status: Home / Self Care | Payer: No Typology Code available for payment source | Attending: Emergency Medicine | Admitting: Emergency Medicine

## 2015-11-24 DIAGNOSIS — S0990XA Unspecified injury of head, initial encounter: Secondary | ICD-10-CM | POA: Diagnosis not present

## 2015-11-24 DIAGNOSIS — F0781 Postconcussional syndrome: Secondary | ICD-10-CM | POA: Insufficient documentation

## 2015-11-24 DIAGNOSIS — I5030 Unspecified diastolic (congestive) heart failure: Secondary | ICD-10-CM | POA: Diagnosis not present

## 2015-11-24 DIAGNOSIS — R040 Epistaxis: Secondary | ICD-10-CM | POA: Insufficient documentation

## 2015-11-24 DIAGNOSIS — R4182 Altered mental status, unspecified: Secondary | ICD-10-CM | POA: Insufficient documentation

## 2015-11-24 DIAGNOSIS — Z87891 Personal history of nicotine dependence: Secondary | ICD-10-CM | POA: Insufficient documentation

## 2015-11-24 DIAGNOSIS — Z79899 Other long term (current) drug therapy: Secondary | ICD-10-CM | POA: Diagnosis not present

## 2015-11-24 DIAGNOSIS — I110 Hypertensive heart disease with heart failure: Secondary | ICD-10-CM | POA: Diagnosis not present

## 2015-11-24 DIAGNOSIS — J45909 Unspecified asthma, uncomplicated: Secondary | ICD-10-CM | POA: Insufficient documentation

## 2015-11-24 DIAGNOSIS — S199XXA Unspecified injury of neck, initial encounter: Secondary | ICD-10-CM | POA: Diagnosis not present

## 2015-11-24 DIAGNOSIS — Y92410 Unspecified street and highway as the place of occurrence of the external cause: Secondary | ICD-10-CM | POA: Diagnosis not present

## 2015-11-24 DIAGNOSIS — Y939 Activity, unspecified: Secondary | ICD-10-CM | POA: Insufficient documentation

## 2015-11-24 DIAGNOSIS — Y999 Unspecified external cause status: Secondary | ICD-10-CM | POA: Diagnosis not present

## 2015-11-24 DIAGNOSIS — Z7982 Long term (current) use of aspirin: Secondary | ICD-10-CM | POA: Diagnosis not present
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
## 2015-11-27 NOTE — Telephone Encounter (Signed)
noted 

## 2015-12-06 ENCOUNTER — Ambulatory Visit (INDEPENDENT_AMBULATORY_CARE_PROVIDER_SITE_OTHER): Payer: Medicare Other | Admitting: Physician Assistant

## 2015-12-06 ENCOUNTER — Ambulatory Visit (HOSPITAL_COMMUNITY)
Admission: RE | Admit: 2015-12-06 | Discharge: 2015-12-06 | Disposition: A | Discharge status: Home / Self Care | Payer: Medicare Other | Source: Ambulatory Visit | Attending: Physician Assistant | Admitting: Physician Assistant

## 2015-12-06 ENCOUNTER — Encounter: Payer: Self-pay | Admitting: Physician Assistant

## 2015-12-06 VITALS — BP 144/84 | HR 76 | Temp 98.3°F | Resp 18 | Wt 182.0 lb

## 2015-12-06 DIAGNOSIS — I6523 Occlusion and stenosis of bilateral carotid arteries: Secondary | ICD-10-CM | POA: Diagnosis not present

## 2015-12-06 DIAGNOSIS — W19XXXA Unspecified fall, initial encounter: Secondary | ICD-10-CM | POA: Diagnosis not present

## 2015-12-06 DIAGNOSIS — S0990XA Unspecified injury of head, initial encounter: Secondary | ICD-10-CM

## 2015-12-06 DIAGNOSIS — R51 Headache: Secondary | ICD-10-CM | POA: Diagnosis not present
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
## 2015-12-25 DIAGNOSIS — F411 Generalized anxiety disorder: Secondary | ICD-10-CM | POA: Diagnosis not present

## 2016-01-11 DIAGNOSIS — Z961 Presence of intraocular lens: Secondary | ICD-10-CM | POA: Diagnosis not present

## 2016-01-11 DIAGNOSIS — H5213 Myopia, bilateral: Secondary | ICD-10-CM | POA: Diagnosis not present

## 2016-01-11 DIAGNOSIS — H2512 Age-related nuclear cataract, left eye: Secondary | ICD-10-CM | POA: Diagnosis not present

## 2016-01-11 DIAGNOSIS — H524 Presbyopia: Secondary | ICD-10-CM | POA: Diagnosis not present

## 2016-01-11 DIAGNOSIS — H52223 Regular astigmatism, bilateral: Secondary | ICD-10-CM | POA: Diagnosis not present

## 2016-01-22 DIAGNOSIS — F411 Generalized anxiety disorder: Secondary | ICD-10-CM | POA: Diagnosis not present

## 2016-02-08 DIAGNOSIS — M170 Bilateral primary osteoarthritis of knee: Secondary | ICD-10-CM | POA: Diagnosis not present

## 2016-02-08 DIAGNOSIS — M25561 Pain in right knee: Secondary | ICD-10-CM | POA: Diagnosis not present

## 2016-02-08 DIAGNOSIS — M25562 Pain in left knee: Secondary | ICD-10-CM | POA: Diagnosis not present

## 2016-02-08 DIAGNOSIS — R262 Difficulty in walking, not elsewhere classified: Secondary | ICD-10-CM | POA: Diagnosis not present

## 2016-02-09 ENCOUNTER — Ambulatory Visit (INDEPENDENT_AMBULATORY_CARE_PROVIDER_SITE_OTHER): Payer: Medicare Other | Admitting: Family Medicine

## 2016-02-09 ENCOUNTER — Encounter: Payer: Self-pay | Admitting: Family Medicine

## 2016-02-09 VITALS — BP 132/74 | HR 78 | Temp 98.2°F | Resp 18 | Ht 65.0 in | Wt 182.0 lb

## 2016-02-09 DIAGNOSIS — Z23 Encounter for immunization: Secondary | ICD-10-CM

## 2016-02-09 DIAGNOSIS — H9113 Presbycusis, bilateral: Secondary | ICD-10-CM | POA: Diagnosis not present

## 2016-02-09 NOTE — Progress Notes (Signed)
Subjective:    Patient ID: Laura Carter, female    DOB: 1941-02-19, 75 y.o.   MRN: [id number]  HPI Patient is here today complaining of bilateral hearing loss. She denies any ear pain. She denies any headache. She denies any vertigo. She denies any sinus symptoms or rhinorrhea or head congestion. Symptoms have been gradually worsening for several months. She's had difficult time hearing in crowds for years. 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 Exam today is unremarkable Past Medical History:  Diagnosis Date  . Asthma   . Diastolic dysfunction   . GERD (gastroesophageal reflux disease)   . Hypertension    Past Surgical History:  Procedure Laterality Date  . CHOLECYSTECTOMY  10/10  . KNEE ARTHROSCOPY WITH MEDIAL MENISECTOMY Left 06/16/2015   Procedure: KNEE ARTHROSCOPY WITH PARTIAL MEDIAL MENISECTOMY;  Surgeon: Carole Civil, MD;  Location: [facility];  Service: Orthopedics;  Laterality: Left;   Current Outpatient Prescriptions on File Prior to Visit  Medication Sig Dispense Refill  . albuterol (PROVENTIL HFA;VENTOLIN HFA) 108 (90 Base) MCG/ACT inhaler Inhale 2 puffs into the lungs every 4 (four) hours as needed for wheezing or shortness of breath. 1 Inhaler 1  . aspirin 81 MG tablet Take 81 mg by mouth daily.    Marland Kitchen b complex vitamins tablet Take 1 tablet by mouth daily.    Marland Kitchen Bioflavonoid Products (BIOFLEX PO) Take 2 tablets by mouth daily.    . calcium carbonate (TUMS EX) 750 MG chewable tablet Chew 1 tablet by mouth daily as needed for heartburn.     . cholecalciferol (VITAMIN D) 1000 UNITS tablet Take 1,000 Units by mouth daily.    Marland Kitchen co-enzyme Q-10 30 MG capsule Take 30 mg by mouth 3 (three) times daily.    . fluticasone (FLONASE) 50 MCG/ACT nasal spray Place 2 sprays into both nostrils daily. (Patient taking differently: Place 2 sprays into both nostrils daily as needed for allergies. ) 16 g 6  . Krill Oil 500 MG CAPS Take 1 capsule by mouth daily.     . Magnesium  500 MG CAPS Take 1 capsule by mouth daily.     . metoprolol succinate (TOPROL-XL) 25 MG 24 hr tablet Take 1 tablet (25 mg total) by mouth daily. 90 tablet 3  . Multiple Vitamins-Minerals (ZINC PO) Take 1 tablet by mouth daily as needed (cold).    . ranitidine (ZANTAC) 150 MG tablet Take 150 mg by mouth daily.     . Saccharomyces boulardii (PROBIOTIC) 250 MG CAPS Take 1 capsule by mouth daily.    . TURMERIC PO Take 1 capsule by mouth daily.     No current facility-administered medications on file prior to visit.    No Known Allergies Social History   Social History  . Marital status: Married    Spouse name: N/A  . Number of children: N/A  . Years of education: N/A   Occupational History  . Not on file.   Social History Main Topics  . Smoking status: Former Smoker    Packs/day: 1.00    Years: 10.00    Types: Cigarettes    Quit date: 06/13/1965  . Smokeless tobacco: Not on file  . Alcohol use No  . Drug use: No  . Sexual activity: No   Other Topics Concern  . Not on file   Social History Narrative  . No narrative on file      Review of Systems  All other systems reviewed and are negative.  Objective:   Physical Exam  Constitutional: She appears well-developed and well-nourished.  HENT:  Right Ear: Tympanic membrane, external ear and ear canal normal.  Left Ear: Tympanic membrane, external ear and ear canal normal.  Mouth/Throat: Oropharynx is clear and moist. No oropharyngeal exudate.  Eyes: Conjunctivae and EOM are normal. Pupils are equal, round, and reactive to light.  Cardiovascular: Normal rate, regular rhythm and normal heart sounds.   Pulmonary/Chest: Effort normal and breath sounds normal.  Vitals reviewed.         Assessment & Plan:  Presbycusis of both ears  Need for prophylactic vaccination against Streptococcus pneumoniae (pneumococcus) - Plan: Pneumococcal polysaccharide vaccine 23-valent greater than or equal to 2yo subcutaneous/IM  I  believe patient has age-related hearing loss. I recommended an ENT consultation and evaluation for hearing aids. She already has this scheduled for 2 weeks. I see no indication for any intracranial pathology. Patient did receive her pneumonia vaccine today. She declined a flu shot.

## 2016-02-22 DIAGNOSIS — H43813 Vitreous degeneration, bilateral: Secondary | ICD-10-CM | POA: Diagnosis not present

## 2016-02-22 DIAGNOSIS — H5310 Unspecified subjective visual disturbances: Secondary | ICD-10-CM | POA: Diagnosis not present

## 2016-02-22 DIAGNOSIS — H25812 Combined forms of age-related cataract, left eye: Secondary | ICD-10-CM | POA: Diagnosis not present

## 2016-02-22 DIAGNOSIS — H5213 Myopia, bilateral: Secondary | ICD-10-CM | POA: Diagnosis not present

## 2016-02-26 DIAGNOSIS — F411 Generalized anxiety disorder: Secondary | ICD-10-CM | POA: Diagnosis not present

## 2016-03-06 DIAGNOSIS — H903 Sensorineural hearing loss, bilateral: Secondary | ICD-10-CM | POA: Diagnosis not present

## 2016-03-28 ENCOUNTER — Other Ambulatory Visit: Payer: Self-pay | Admitting: Family Medicine

## 2016-03-28 NOTE — Telephone Encounter (Signed)
Medication refilled per protocol. 

## 2016-05-30 ENCOUNTER — Other Ambulatory Visit: Payer: Self-pay | Admitting: Family Medicine

## 2016-05-30 DIAGNOSIS — Z1231 Encounter for screening mammogram for malignant neoplasm of breast: Secondary | ICD-10-CM

## 2016-05-31 ENCOUNTER — Ambulatory Visit
Admission: RE | Admit: 2016-05-31 | Discharge: 2016-05-31 | Disposition: A | Discharge status: Home / Self Care | Payer: Medicare Other | Source: Ambulatory Visit | Attending: Family Medicine | Admitting: Family Medicine

## 2016-05-31 DIAGNOSIS — Z1231 Encounter for screening mammogram for malignant neoplasm of breast: Secondary | ICD-10-CM

## 2016-06-05 ENCOUNTER — Ambulatory Visit: Payer: Medicare Other

## 2016-06-06 ENCOUNTER — Ambulatory Visit (INDEPENDENT_AMBULATORY_CARE_PROVIDER_SITE_OTHER): Payer: Medicare Other | Admitting: Family Medicine

## 2016-06-06 DIAGNOSIS — Z23 Encounter for immunization: Secondary | ICD-10-CM

## 2016-06-07 ENCOUNTER — Ambulatory Visit (INDEPENDENT_AMBULATORY_CARE_PROVIDER_SITE_OTHER): Payer: Medicare Other | Admitting: Orthopedic Surgery

## 2016-06-07 DIAGNOSIS — Z9889 Other specified postprocedural states: Secondary | ICD-10-CM | POA: Diagnosis not present

## 2016-06-07 DIAGNOSIS — M48061 Spinal stenosis, lumbar region without neurogenic claudication: Secondary | ICD-10-CM | POA: Diagnosis not present

## 2016-06-07 DIAGNOSIS — M1712 Unilateral primary osteoarthritis, left knee: Secondary | ICD-10-CM | POA: Diagnosis not present
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
## 2016-06-14 ENCOUNTER — Ambulatory Visit (HOSPITAL_COMMUNITY): Payer: Medicare Other | Attending: Orthopedic Surgery | Admitting: Physical Therapy

## 2016-06-14 ENCOUNTER — Telehealth (HOSPITAL_COMMUNITY): Payer: Self-pay | Admitting: Physical Therapy

## 2016-06-14 DIAGNOSIS — M25562 Pain in left knee: Secondary | ICD-10-CM | POA: Diagnosis not present

## 2016-06-14 DIAGNOSIS — R2242 Localized swelling, mass and lump, left lower limb: Secondary | ICD-10-CM | POA: Diagnosis not present

## 2016-06-14 DIAGNOSIS — G8929 Other chronic pain: Secondary | ICD-10-CM

## 2016-06-14 DIAGNOSIS — R262 Difficulty in walking, not elsewhere classified: Secondary | ICD-10-CM

## 2016-06-14 DIAGNOSIS — R2681 Unsteadiness on feet: Secondary | ICD-10-CM | POA: Diagnosis not present

## 2016-06-14 NOTE — Therapy (Addendum)
[facility] [place], [place], [address] Phone: [phone]   Fax:  [phone]  Physical Therapy Evaluation  Patient Details  Name: Laura Carter MRN: [id number] Date of Birth: June 24, 1940 Referring Provider: Arther Abbott   Encounter Date: 06/14/2016      PT End of Session - 06/14/16 1603    Visit Number 1   Number of Visits 12   Date for PT Re-Evaluation 07/14/16   Authorization - Visit Number 1   Authorization - Number of Visits 10   PT Start Time [id number]   PT Stop Time 1603   PT Time Calculation (min) 33 min   Activity Tolerance Patient tolerated treatment well      Past Medical History:  Diagnosis Date  . Asthma   . Diastolic dysfunction   . GERD (gastroesophageal reflux disease)   . Hypertension     Past Surgical History:  Procedure Laterality Date  . CHOLECYSTECTOMY  10/10  . KNEE ARTHROSCOPY WITH MEDIAL MENISECTOMY Left 06/16/2015   Procedure: KNEE ARTHROSCOPY WITH PARTIAL MEDIAL MENISECTOMY;  Surgeon: Carole Civil, MD;  Location: [facility];  Service: Orthopedics;  Laterality: Left;    There were no vitals filed for this visit.       Subjective Assessment - 06/14/16 1534    Subjective Laura Carter states that she has been having constant pain in her left knee.  She had an arthroscopic surgery, flexigenic, accupuncture, chiropractic and physical therapy.  She continues to have difficulty going up and down steps, squatting and going up and down inclines and prolong weightbearing. .  She finally decided that it was time to have a total knee replacement.  She went to the orthopedic surgeon who stated that she did not need a TKR she needed therapy on her back.    Pertinent History arthroscopic surgery,    How long can you sit comfortably? no problem    How long can you stand comfortably? after 15 minutes she wants to prop.    How long can you walk comfortably? able to walk for about 15 minutes    Patient  Stated Goals core and LE to be stronger.    Currently in Pain? --  highest pain is a 3/10 more discomfort    Pain Location Knee   Pain Orientation Left   Pain Descriptors / Indicators Discomfort   Pain Type Chronic pain   Pain Onset More than a month ago   Pain Frequency Intermittent   Aggravating Factors  weight bearing    Pain Relieving Factors ice             [facility] PT Assessment - 06/14/16 0001      Assessment   Medical Diagnosis Lumbar stenosis   Referring Provider Arther Abbott    Onset Date/Surgical Date 06/16/15  for arthroscopic surgery on the Lt knee   Next MD Visit 07/08/2016  d/c     Precautions   Precautions None     Restrictions   Weight Bearing Restrictions No     Balance Screen   Has the patient fallen in the past 6 months No   Has the patient had a decrease in activity level because of a fear of falling?  No   Is the patient reluctant to leave their home because of a fear of falling?  No     Prior Function   Level of Independence Independent;Independent with basic ADLs;Independent with gait;Independent with transfers   Vocation Retired  Leisure yoga, massages, walking      Functional Tests   Functional tests Single leg stance;Sit to Stand     Single Leg Stance   Comments Rt: 27"; Lt:11     Sit to Stand   Comments 5x 15.44      ROM / Strength   AROM / PROM / Strength AROM     AROM   AROM Assessment Site Lumbar   Left Knee Extension 0   Left Knee Flexion 130   Lumbar Flexion wfl  reps increases pain pulling self up    Lumbar Extension 35 reps increases painl     Strength   Overall Strength --  abdominal 3-/5    Right Hip Flexion 5/5   Right Hip Extension 4+/5   Right Hip ABduction 5/5   Left Hip Flexion 5/5   Left Hip Extension 3/5   Left Hip ABduction 4+/5   Right Knee Flexion 5/5   Right Knee Extension 5/5   Left Knee Flexion 5/5   Left Knee Extension 5/5   Right Ankle Dorsiflexion 5/5   Left Ankle Dorsiflexion 5/5      Transfers   Five time sit to stand comments  8.8sec     Ambulation/Gait   Stairs Yes   Stairs Assistance 7: Independent   Stair Management Technique Alternating pattern  cuing from therapist for continuous alternating pattern   Number of Stairs 5   Gait Comments reduced gait speed     High Level Balance   High Level Balance Comments TUG 8.9 without AD                   [facility] Adult PT Treatment/Exercise - 06/14/16 0001      Exercises   Exercises Lumbar     Lumbar Exercises: Supine   Ab Set --  6 point abdominal 5 times each    Bridge 10 reps                PT Education - 06/14/16 1602    Education provided Yes   Education Details HEP   Person(s) Educated Patient   Methods Explanation   Comprehension Verbalized understanding          PT Short Term Goals - 06/14/16 1613      PT SHORT TERM GOAL #1   Title Pt Lt knee pain to be no greater than a 1/10 to allow pt to walk for 20 minutes without a limp.    Time 2   Period Weeks   Status New     PT SHORT TERM GOAL #2   Title Pt to be able to walk up steps without increased Lt knee pain    Time 2   Period Weeks   Status New     PT SHORT TERM GOAL #3   Title Pt to be able to stand for 30 mintues to be able to cook a meal without having to rest.    Time 2   Period Weeks           PT Long Term Goals - 06/14/16 1615      PT LONG TERM GOAL #1   Title Pt to be able to descend 2 flights of steps and descend inclines without increased Lt knee pain    Time 4   Period Weeks   Status New     PT LONG TERM GOAL #2   Title PT to be able to walk for 30 mintues without a limp  or Lt knee pain    Time 4   Period Weeks   Status New     PT LONG TERM GOAL #3   Title Pt to be able to single leg stance on both LE for 30 seconds to allow pt to feel confident walking on uneven ground    Time 4   Period Weeks   Status New         Assessment:  Laura Carter is a 76 yo female who has been having  continued pain in her LT knee that is interfering with Her ability to walk, go up and down steps or inclines.  She went to her orthopedic who stated that she has lumbar Stenosis and would benefit from stabilization exercises.  Her evaluation reveals decreased hip extensor and abductor  Strength on her LT LE as well as decreased balance, increased pain and a bias for lumbar flexion.  Ms.  Carter will benefit  From skilled physical therapy to address these issues.       Plan - 06/14/16 1610    Rehab Potential Good   PT Frequency 3x / week   PT Duration 4 weeks    Pt will benefit from lumbar stabilization:  Begin bent knee lift, isometric hip flexion, sidelying clams, heel raise and  Functional squats.  Pt has flexion bias.   Patient will benefit from skilled therapeutic intervention in order to improve the following deficits and impairments:  Abnormal gait, Decreased activity tolerance, Decreased balance, Pain, Decreased strength, Difficulty walking  Visit Diagnosis: Difficulty in walking, not elsewhere classified - Plan: PT plan of care cert/re-cert  Chronic pain of left knee - Plan: PT plan of care cert/re-cert  Unsteadiness on feet - Plan: PT plan of care cert/re-cert      G-Codes - [phone]    Functional Assessment Tool Used clinical judgement, walking limitation, strength    Functional Limitation Mobility: Walking and moving around   Mobility: Walking and Moving Around Current Status [phone]) At least 20 percent but less than 40 percent impaired, limited or restricted   Mobility: Walking and Moving Around Goal Status [phone]) At least 1 percent but less than 20 percent impaired, limited or restricted       Problem List Patient Active Problem List   Diagnosis Date Noted  . Medial meniscus, posterior horn derangement   . Arthritis of knee   . Primary osteoarthritis of left knee 04/05/2014  . Diastolic dysfunction     Rayetta Humphrey, [place] CLT [phone] 06/14/2016,  4:21 PM  [facility] [place], [place], [address] Phone: [phone]   Fax:  [phone]  Name: Laura Carter MRN: [id number] Date of Birth: 1940-12-25

## 2016-06-14 NOTE — Patient Instructions (Addendum)
Bridging    Slowly raise buttocks from floor, keeping stomach tight. Repeat ___10_ times per set. Do __1__ sets per session. Do ___2_ sessions per day.  http://orth.exer.us/1096   Copyright  VHI. All rights reserved.  6 point abdominal_given sheet Knee-to-Chest Stretch: Unilateral    With hand behind right knee, pull knee in to chest until a comfortable stretch is felt in lower back and buttocks. Keep back relaxed. Hold __30__ seconds. Repeat __3__ times per set. Do __1__ sets per session. Do __2__ sessions per day.  http://orth.exer.us/126   Copyright  VHI. All rights reserved.

## 2016-06-17 NOTE — Telephone Encounter (Signed)
Called pt re possibly coming in early.  No answer.   contraindication

## 2016-06-18 ENCOUNTER — Ambulatory Visit (HOSPITAL_COMMUNITY): Payer: Medicare Other | Admitting: Physical Therapy

## 2016-06-18 DIAGNOSIS — R262 Difficulty in walking, not elsewhere classified: Secondary | ICD-10-CM

## 2016-06-18 DIAGNOSIS — R2681 Unsteadiness on feet: Secondary | ICD-10-CM | POA: Diagnosis not present

## 2016-06-18 DIAGNOSIS — M25562 Pain in left knee: Secondary | ICD-10-CM

## 2016-06-18 DIAGNOSIS — G8929 Other chronic pain: Secondary | ICD-10-CM

## 2016-06-18 DIAGNOSIS — R2242 Localized swelling, mass and lump, left lower limb: Secondary | ICD-10-CM | POA: Diagnosis not present

## 2016-06-18 NOTE — Therapy (Signed)
[place], [place], [address] Phone: [phone]   Fax:  [phone]  Physical Therapy Treatment  Patient Details  Name: HANIFAH SCHIRRA MRN: [id number] Date of Birth: June 16, 1940 Referring Provider: Arther Abbott   Encounter Date: 06/18/2016      PT End of Session - 06/18/16 1337    Visit Number 2   Number of Visits 12   Date for PT Re-Evaluation 07/14/16   Authorization - Visit Number 2   Authorization - Number of Visits 10   PT Start Time 1034   PT Stop Time 1116   PT Time Calculation (min) 42 min   Activity Tolerance Patient tolerated treatment well      Past Medical History:  Diagnosis Date  . Asthma   . Diastolic dysfunction   . GERD (gastroesophageal reflux disease)   . Hypertension     Past Surgical History:  Procedure Laterality Date  . CHOLECYSTECTOMY  10/10  . KNEE ARTHROSCOPY WITH MEDIAL MENISECTOMY Left 06/16/2015   Procedure: KNEE ARTHROSCOPY WITH PARTIAL MEDIAL MENISECTOMY;  Surgeon: Carole Civil, MD;  Location: [facility];  Service: Orthopedics;  Laterality: Left;    There were no vitals filed for this visit.      Subjective Assessment - 06/18/16 1333    Subjective Pt states her Lt knee is hurting a little today, wearing a patellar band that seems to help.  Pt reports her back was tight this morning but not really bothering her currently.                         [facility] Adult PT Treatment/Exercise - 06/18/16 0001      Lumbar Exercises: Supine   Ab Set 5 reps  reveiwed 6 point abdominal exercise given as HEP   Bent Knee Raise 10 reps   Bridge 10 reps  With Lt LE back further   Bridge Limitations with Lt LE back further   Isometric Hip Flexion 10 reps;5 seconds     Lumbar Exercises: Sidelying   Clam 10 reps     Lumbar Exercises: Prone   Straight Leg Raise 10 reps                PT Education - 06/18/16 1337    Education provided Yes   Education  Details reviewed HEP and intial evaluation goals.     Person(s) Educated Patient   Methods Explanation;Demonstration;Tactile cues;Verbal cues;Handout   Comprehension Verbalized understanding;Returned demonstration;Verbal cues required;Tactile cues required;Need further instruction          PT Short Term Goals - 06/14/16 1613      PT SHORT TERM GOAL #1   Title Pt Lt knee pain to be no greater than a 1/10 to allow pt to walk for 20 minutes without a limp.    Time 2   Period Weeks   Status New     PT SHORT TERM GOAL #2   Title Pt to be able to walk up steps without increased Lt knee pain    Time 2   Period Weeks   Status New     PT SHORT TERM GOAL #3   Title Pt to be able to stand for 30 mintues to be able to cook a meal without having to rest.    Time 2   Period Weeks           PT Long Term Goals - 06/14/16 1615  PT LONG TERM GOAL #1   Title Pt to be able to descend 2 flights of steps and descend inclines without increased Lt knee pain    Time 4   Period Weeks   Status New     PT LONG TERM GOAL #2   Title PT to be able to walk for 30 mintues without a limp or Lt knee pain    Time 4   Period Weeks   Status New     PT LONG TERM GOAL #3   Title Pt to be able to single leg stance on both LE for 30 seconds to allow pt to feel confident walking on uneven ground    Time 5   Period Weeks   Status New               Plan - 06/18/16 1338    Clinical Impression Statement Reviewed initial evaluation and goals moving forward with rehab.  Pt able to completel HEP with questions regarding isometric 6 point isometrics.  Progressed per plan with addition of bent knee raises, isometric hip flexion and side lying clams.  Completed bridge with Lt LE back further to work the weaker side harder.  Pt required cues to complete slowly and controlled.    Rehab Potential Good   PT Frequency 3x / week   PT Duration 4 weeks   PT Treatment/Interventions Patient/family  education;Manual techniques;Therapeutic exercise;Balance training;Gait training   PT Next Visit Plan progress lumbar stabilization to include, heel raises, functional squat, standing hip exercises (Left is significantly weaker than Rt).     Consulted and Agree with Plan of Care Patient      Patient will benefit from skilled therapeutic intervention in order to improve the following deficits and impairments:  Abnormal gait, Decreased activity tolerance, Decreased balance, Pain, Decreased strength, Difficulty walking  Visit Diagnosis: Difficulty in walking, not elsewhere classified  Chronic pain of left knee  Unsteadiness on feet     Problem List Patient Active Problem List   Diagnosis Date Noted  . Medial meniscus, posterior horn derangement   . Arthritis of knee   . Primary osteoarthritis of left knee 04/05/2014  . Diastolic dysfunction     Teena Irani, PTA/CLT [phone]  06/18/2016, 1:48 PM  [facility] [address] [place], [place], [address] Phone: [phone]   Fax:  [phone]  Name: DNIA STREIGHT MRN: [id number] Date of Birth: [birth year]

## 2016-06-19 ENCOUNTER — Ambulatory Visit (HOSPITAL_COMMUNITY): Payer: Medicare Other | Admitting: Physical Therapy

## 2016-06-19 DIAGNOSIS — R262 Difficulty in walking, not elsewhere classified: Secondary | ICD-10-CM

## 2016-06-19 DIAGNOSIS — G8929 Other chronic pain: Secondary | ICD-10-CM | POA: Diagnosis not present

## 2016-06-19 DIAGNOSIS — R2681 Unsteadiness on feet: Secondary | ICD-10-CM | POA: Diagnosis not present

## 2016-06-19 DIAGNOSIS — M25562 Pain in left knee: Secondary | ICD-10-CM

## 2016-06-19 DIAGNOSIS — R2242 Localized swelling, mass and lump, left lower limb: Secondary | ICD-10-CM | POA: Diagnosis not present

## 2016-06-19 NOTE — Therapy (Signed)
[place], [place], [address] Phone: [phone]   Fax:  [phone]  Physical Therapy Treatment  Patient Details  Name: Laura Carter MRN: [id number] Date of Birth: [birth year] Referring Provider: Arther Abbott   Encounter Date: 06/19/2016      PT End of Session - 06/19/16 1556    Visit Number 3   Number of Visits 12   Date for PT Re-Evaluation 07/14/16   Authorization - Visit Number 3   Authorization - Number of Visits 10   PT Start Time [id number]   PT Stop Time 1520   PT Time Calculation (min) 45 min   Activity Tolerance Patient tolerated treatment well      Past Medical History:  Diagnosis Date  . Asthma   . Diastolic dysfunction   . GERD (gastroesophageal reflux disease)   . Hypertension     Past Surgical History:  Procedure Laterality Date  . CHOLECYSTECTOMY  10/10  . KNEE ARTHROSCOPY WITH MEDIAL MENISECTOMY Left 06/16/2015   Procedure: KNEE ARTHROSCOPY WITH PARTIAL MEDIAL MENISECTOMY;  Surgeon: Carole Civil, MD;  Location: [facility];  Service: Orthopedics;  Laterality: Left;    There were no vitals filed for this visit.      Subjective Assessment - 06/19/16 1503    Subjective Pt states she ran some errands after leaving here yesterday and between her workout here and the walking she did after she began to feel the soreness late yesterday afternoon.  Pt reports no pain today, however some lower back soreness   Currently in Pain? No/denies                         [facility] Adult PT Treatment/Exercise - 06/19/16 0001      Lumbar Exercises: Stretches   Active Hamstring Stretch Limitations   Active Hamstring Stretch Limitations pt completes this every morning with HEP     Lumbar Exercises: Seated   Sit to Stand 10 reps   Sit to Stand Limitations no UE's     Lumbar Exercises: Supine   Bridge 15 reps   Bridge Limitations 2 sets, 1 with Lt LE back further, 1 with Rt back further    Straight Leg Raise 10 reps  bilaterally   Isometric Hip Flexion 10 reps;5 seconds     Lumbar Exercises: Sidelying   Hip Abduction 15 reps     Lumbar Exercises: Prone   Straight Leg Raise 10 reps     Knee/Hip Exercises: Standing   SLS 5 X max of 16" each no UE's                PT Education - 06/18/16 1337    Education provided Yes   Education Details reviewed HEP and intial evaluation goals.     Person(s) Educated Patient   Methods Explanation;Demonstration;Tactile cues;Verbal cues;Handout   Comprehension Verbalized understanding;Returned demonstration;Verbal cues required;Tactile cues required;Need further instruction          PT Short Term Goals - 06/14/16 1613      PT SHORT TERM GOAL #1   Title Pt Lt knee pain to be no greater than a 1/10 to allow pt to walk for 20 minutes without a limp.    Time 2   Period Weeks   Status New     PT SHORT TERM GOAL #2   Title Pt to be able to walk up steps without increased Lt knee pain    Time  2   Period Weeks   Status New     PT SHORT TERM GOAL #3   Title Pt to be able to stand for 30 mintues to be able to cook a meal without having to rest.    Time 2   Period Weeks           PT Long Term Goals - 06/14/16 1615      PT LONG TERM GOAL #1   Title Pt to be able to descend 2 flights of steps and descend inclines without increased Lt knee pain    Time 4   Period Weeks   Status New     PT LONG TERM GOAL #2   Title PT to be able to walk for 30 mintues without a limp or Lt knee pain    Time 4   Period Weeks   Status New     PT LONG TERM GOAL #3   Title Pt to be able to single leg stance on both LE for 30 seconds to allow pt to feel confident walking on uneven ground    Time 5   Period Weeks   Status New               Plan - 06/19/16 1553    Clinical Impression Statement Pt with some soreness and with back to back appointments today.  Reviewed HEP and added exercises from last session.  Progressed  to sidelying hip abduction as pateint reported clams being easy and progressed reps with some exercises.  Also added sit to stands and SLS to work on hip stability this session.  Pt without pain, only general fatigue at end of session.    Rehab Potential Good   PT Frequency 3x / week   PT Duration 4 weeks   PT Treatment/Interventions Patient/family education;Manual techniques;Therapeutic exercise;Balance training;Gait training   PT Next Visit Plan progress lumbar stabilization to include, heel raises, functional squat, standing hip exercises (Left is significantly weaker than Rt).   Begin with SLS next sesson as not to fatigue and add vector stance.   Consulted and Agree with Plan of Care Patient      Patient will benefit from skilled therapeutic intervention in order to improve the following deficits and impairments:  Abnormal gait, Decreased activity tolerance, Decreased balance, Pain, Decreased strength, Difficulty walking  Visit Diagnosis: Difficulty in walking, not elsewhere classified  Chronic pain of left knee  Unsteadiness on feet     Problem List Patient Active Problem List   Diagnosis Date Noted  . Medial meniscus, posterior horn derangement   . Arthritis of knee   . Primary osteoarthritis of left knee 04/05/2014  . Diastolic dysfunction     Teena Irani, PTA/CLT [phone]  06/19/2016, 3:57 PM  [place], [place], [address] Phone: [phone]   Fax:  [phone]  Name: Laura Carter MRN: [id number] Date of Birth: 1941-04-18

## 2016-06-20 ENCOUNTER — Encounter: Payer: Self-pay | Admitting: Orthopedic Surgery

## 2016-06-21 ENCOUNTER — Ambulatory Visit (HOSPITAL_COMMUNITY): Payer: Medicare Other

## 2016-06-21 DIAGNOSIS — M25562 Pain in left knee: Secondary | ICD-10-CM

## 2016-06-21 DIAGNOSIS — R2242 Localized swelling, mass and lump, left lower limb: Secondary | ICD-10-CM | POA: Diagnosis not present

## 2016-06-21 DIAGNOSIS — G8929 Other chronic pain: Secondary | ICD-10-CM | POA: Diagnosis not present

## 2016-06-21 DIAGNOSIS — R2681 Unsteadiness on feet: Secondary | ICD-10-CM | POA: Diagnosis not present

## 2016-06-21 DIAGNOSIS — R262 Difficulty in walking, not elsewhere classified: Secondary | ICD-10-CM | POA: Diagnosis not present

## 2016-06-21 NOTE — Therapy (Signed)
[place], [place], [address] Phone: [phone]   Fax:  [phone]  Physical Therapy Treatment  Patient Details  Name: Laura Carter MRN: [id number] Date of Birth: 09/04/1940 Referring Provider: Arther Abbott   Encounter Date: 06/21/2016      PT End of Session - 06/21/16 1454    Visit Number 4   Number of Visits 12   Date for PT Re-Evaluation 07/14/16   Authorization - Visit Number 4   Authorization - Number of Visits 10   PT Start Time [id number]   PT Stop Time 1530   PT Time Calculation (min) 41 min   Activity Tolerance Patient tolerated treatment well   Behavior During Therapy [facility] for tasks assessed/performed      Past Medical History:  Diagnosis Date  . Asthma   . Diastolic dysfunction   . GERD (gastroesophageal reflux disease)   . Hypertension     Past Surgical History:  Procedure Laterality Date  . CHOLECYSTECTOMY  10/10  . KNEE ARTHROSCOPY WITH MEDIAL MENISECTOMY Left 06/16/2015   Procedure: KNEE ARTHROSCOPY WITH PARTIAL MEDIAL MENISECTOMY;  Surgeon: Carole Civil, MD;  Location: [facility];  Service: Orthopedics;  Laterality: Left;    There were no vitals filed for this visit.      Subjective Assessment - 06/21/16 1449    Subjective Pt stated she has decreased pain following the shots 2 weeks ago, currently pain free.  Reports compliance with HEP and has completed yoga classes yesterday.     Pertinent History arthroscopic surgery,    Patient Stated Goals core and LE to be stronger.    Currently in Pain? No/denies                         [facility] Adult PT Treatment/Exercise - 06/21/16 0001      Lumbar Exercises: Standing   Heel Raises 15 reps   Heel Raises Limitations toe raises on slope   Functional Squats 10 reps   Other Standing Lumbar Exercises SLS initial session Rt 22", Lt 7" max of 3; vector stance with HHA 2x 5" holds   Other Standing Lumbar Exercises hip  abduction 2x 10 reps (cueing for form)     Lumbar Exercises: Seated   Sit to Stand 10 reps   Sit to Stand Limitations no UE's     Lumbar Exercises: Sidelying   Clam 15 reps   Hip Abduction 15 reps     Lumbar Exercises: Prone   Straight Leg Raise 10 reps                  PT Short Term Goals - 06/14/16 1613      PT SHORT TERM GOAL #1   Title Pt Lt knee pain to be no greater than a 1/10 to allow pt to walk for 20 minutes without a limp.    Time 2   Period Weeks   Status New     PT SHORT TERM GOAL #2   Title Pt to be able to walk up steps without increased Lt knee pain    Time 2   Period Weeks   Status New     PT SHORT TERM GOAL #3   Title Pt to be able to stand for 30 mintues to be able to cook a meal without having to rest.    Time 2   Period Weeks  PT Long Term Goals - 06/14/16 1615      PT LONG TERM GOAL #1   Title Pt to be able to descend 2 flights of steps and descend inclines without increased Lt knee pain    Time 4   Period Weeks   Status New     PT LONG TERM GOAL #2   Title PT to be able to walk for 30 mintues without a limp or Lt knee pain    Time 4   Period Weeks   Status New     PT LONG TERM GOAL #3   Title Pt to be able to single leg stance on both LE for 30 seconds to allow pt to feel confident walking on uneven ground    Time 5   Period Weeks   Status New               Plan - 06/21/16 1525    Clinical Impression Statement Pt stated she is progressing well with compliance with HEP as well as yoga classess weekly and pain free for 2 weeks.  Session foucs on improving lumbar stabilization.  Progressed to close chain exercises for functional strengthening.  Began session wtih balance training per request due to fatigue.  Pt continues to demonstrate proximal weakness especially with Lt LE.  Pt able to complete all exercises with therapist facilitaiton for proper form and technique.  No reports of pain through session, was  limited by fatigue.     Rehab Potential Good   PT Frequency 3x / week   PT Duration 4 weeks   PT Treatment/Interventions Patient/family education;Manual techniques;Therapeutic exercise;Balance training;Gait training   PT Next Visit Plan Progress lumbar stabilization including hip strengtheing exercises and SLS/vector stance (Left LE is significantly weaker than Rt.)      Patient will benefit from skilled therapeutic intervention in order to improve the following deficits and impairments:  Abnormal gait, Decreased activity tolerance, Decreased balance, Pain, Decreased strength, Difficulty walking  Visit Diagnosis: Difficulty in walking, not elsewhere classified  Chronic pain of left knee  Unsteadiness on feet     Problem List Patient Active Problem List   Diagnosis Date Noted  . Medial meniscus, posterior horn derangement   . Arthritis of knee   . Primary osteoarthritis of left knee 04/05/2014  . Diastolic dysfunction    Ihor Austin, LPTA; CBIS [phone]  Aldona Lento 06/21/2016, 3:46 PM  [place], [place], [address] Phone: [phone]   Fax:  [phone]  Name: Laura Carter MRN: [id number] Date of Birth: 09/09/1940

## 2016-06-24 ENCOUNTER — Ambulatory Visit (HOSPITAL_COMMUNITY): Payer: Medicare Other | Admitting: Physical Therapy

## 2016-06-24 DIAGNOSIS — G8929 Other chronic pain: Secondary | ICD-10-CM

## 2016-06-24 DIAGNOSIS — R2681 Unsteadiness on feet: Secondary | ICD-10-CM

## 2016-06-24 DIAGNOSIS — R2242 Localized swelling, mass and lump, left lower limb: Secondary | ICD-10-CM | POA: Diagnosis not present

## 2016-06-24 DIAGNOSIS — R262 Difficulty in walking, not elsewhere classified: Secondary | ICD-10-CM

## 2016-06-24 DIAGNOSIS — M25562 Pain in left knee: Secondary | ICD-10-CM

## 2016-06-24 NOTE — Therapy (Addendum)
[place], [place], [address] Phone: [phone]   Fax:  [phone]  Physical Therapy Treatment  Patient Details  Name: Laura Carter MRN: [id number] Date of Birth: 1940-10-12 Referring Provider: Arther Abbott   Encounter Date: 06/24/2016      PT End of Session - 06/24/16 1627    Visit Number 5   Number of Visits 12   Date for PT Re-Evaluation 07/14/16   Authorization - Visit Number 5   Authorization - Number of Visits 10   PT Start Time 1430   PT Stop Time 1520   PT Time Calculation (min) 50 min   Activity Tolerance Patient tolerated treatment well   Behavior During Therapy [facility] for tasks assessed/performed      Past Medical History:  Diagnosis Date  . Asthma   . Diastolic dysfunction   . GERD (gastroesophageal reflux disease)   . Hypertension     Past Surgical History:  Procedure Laterality Date  . CHOLECYSTECTOMY  10/10  . KNEE ARTHROSCOPY WITH MEDIAL MENISECTOMY Left 06/16/2015   Procedure: KNEE ARTHROSCOPY WITH PARTIAL MEDIAL MENISECTOMY;  Surgeon: Carole Civil, MD;  Location: [facility];  Service: Orthopedics;  Laterality: Left;    There were no vitals filed for this visit.       Subjective Assessment - 06/24/16 1434    Subjective Pt states she did alot of walking and steps this week with descending being most difficult.  Pt also reports she continues to attend the yoga   classes and has found the warrior poses are the hardest on her Lt knee.  Medial Lt knee pain reported at 4/10.     Currently in Pain? Yes   Pain Location Knee   Pain Orientation Left                    [facility] Adult PT Treatment/Exercise - 06/24/16 0001      Lumbar Exercises: Standing   Heel Raises 15 reps   Heel Raises Limitations toe raises on slope 15reps   Functional Squats 15 reps   Forward Lunge 10 reps   Forward Lunge Limitations onto 4" box no UE's   Side Lunge 10 reps  onto 4" box no UE's   Other Standing Lumbar Exercises hip abduction 10 reps, hip extension 10 reps (cueing for form)     Lumbar Exercises: Seated   Sit to Stand 10 reps   Sit to Stand Limitations no UE's             Balance Exercises - 06/24/16 1448      Balance Exercises: Standing   SLS Eyes open;5 reps;Solid surface  max 32" Rt , 8" Lt no UE's.   SLS with Vectors 5 reps;Upper extremity assist 1  5" holds   Balance Beam sidestepping 2RT, tandem 2 RT   Tandem Gait 2 reps  on line and on balance beam   Retro Gait 2 reps             PT Short Term Goals - 06/14/16 1613      PT SHORT TERM GOAL #1   Title Pt Lt knee pain to be no greater than a 1/10 to allow pt to walk for 20 minutes without a limp.    Time 2   Period Weeks   Status New     PT SHORT TERM GOAL #2   Title Pt to be able to walk up steps without increased Lt  knee pain    Time 2   Period Weeks   Status New     PT SHORT TERM GOAL #3   Title Pt to be able to stand for 30 mintues to be able to cook a meal without having to rest.    Time 2   Period Weeks           PT Long Term Goals - 06/14/16 1615      PT LONG TERM GOAL #1   Title Pt to be able to descend 2 flights of steps and descend inclines without increased Lt knee pain    Time 4   Period Weeks   Status New     PT LONG TERM GOAL #2   Title PT to be able to walk for 30 mintues without a limp or Lt knee pain    Time 4   Period Weeks   Status New     PT LONG TERM GOAL #3   Title Pt to be able to single leg stance on both LE for 30 seconds to allow pt to feel confident walking on uneven ground    Time 5   Period Weeks   Status New               Plan - 06/24/16 1628    Clinical Impression Statement Pt with a little more discomfort in Lt knee today due to arthritis, however requested to focus on balance and stability this session.  Progressed to dynamic standing balance activities with tandem being most difficult for her to complete.Pt required 3  short seated rest breaks during session today but overall with minimal fatigue.  Pt reported reduced pain/discomfort at end of session.    Rehab Potential Good   PT Frequency 3x / week   PT Duration 4 weeks   PT Treatment/Interventions Patient/family education;Manual techniques;Therapeutic exercise;Balance training;Gait training   PT Next Visit Plan Progress lumbar stabilization including hip strengtheing exercises and SLS/vector stance (Left LE is significantly weaker than Rt.)      Patient will benefit from skilled therapeutic intervention in order to improve the following deficits and impairments:  Abnormal gait, Decreased activity tolerance, Decreased balance, Pain, Decreased strength, Difficulty walking  Visit Diagnosis: Difficulty in walking, not elsewhere classified  Chronic pain of left knee  Unsteadiness on feet  Localized swelling, mass and lump, left lower limb     Problem List Patient Active Problem List   Diagnosis Date Noted  . Medial meniscus, posterior horn derangement   . Arthritis of knee   . Primary osteoarthritis of left knee 04/05/2014  . Diastolic dysfunction     Laura Carter, PTA/CLT [phone]  06/24/2016, 4:31 PM  [place], [place], [address] Phone: [phone]   Fax:  [phone]  Name: Laura Carter MRN: [id number] Date of Birth: 02/09/1941

## 2016-06-26 ENCOUNTER — Telehealth (HOSPITAL_COMMUNITY): Payer: Self-pay

## 2016-06-26 ENCOUNTER — Ambulatory Visit (HOSPITAL_COMMUNITY): Payer: Medicare Other

## 2016-06-26 NOTE — Telephone Encounter (Signed)
SHe has an upset stomach and afraid to come in today

## 2016-06-28 ENCOUNTER — Ambulatory Visit (HOSPITAL_COMMUNITY): Payer: Medicare Other | Attending: Orthopedic Surgery | Admitting: Physical Therapy

## 2016-06-28 DIAGNOSIS — M25562 Pain in left knee: Secondary | ICD-10-CM | POA: Insufficient documentation

## 2016-06-28 DIAGNOSIS — G8929 Other chronic pain: Secondary | ICD-10-CM | POA: Insufficient documentation

## 2016-06-28 DIAGNOSIS — R262 Difficulty in walking, not elsewhere classified: Secondary | ICD-10-CM | POA: Insufficient documentation

## 2016-06-28 DIAGNOSIS — R2681 Unsteadiness on feet: Secondary | ICD-10-CM | POA: Insufficient documentation

## 2016-06-28 NOTE — Therapy (Signed)
[place], [place], [address] Phone: [phone]   Fax:  [phone]  Physical Therapy Treatment  Patient Details  Name: Laura Carter MRN: [id number] Date of Birth: 10-Feb-1941 Referring Provider: Arther Abbott   Encounter Date: 06/28/2016      PT End of Session - 06/28/16 1520    Visit Number 6   Number of Visits 12   Date for PT Re-Evaluation 07/14/16   Authorization - Visit Number 6   Authorization - Number of Visits 10   PT Start Time [id number]   PT Stop Time 1515   PT Time Calculation (min) 43 min   Activity Tolerance Patient tolerated treatment well   Behavior During Therapy [facility] for tasks assessed/performed      Past Medical History:  Diagnosis Date  . Asthma   . Diastolic dysfunction   . GERD (gastroesophageal reflux disease)   . Hypertension     Past Surgical History:  Procedure Laterality Date  . CHOLECYSTECTOMY  10/10  . KNEE ARTHROSCOPY WITH MEDIAL MENISECTOMY Left 06/16/2015   Procedure: KNEE ARTHROSCOPY WITH PARTIAL MEDIAL MENISECTOMY;  Surgeon: Carole Civil, MD;  Location: [facility];  Service: Orthopedics;  Laterality: Left;    There were no vitals filed for this visit.      Subjective Assessment - 06/28/16 1435    Subjective Pt states that she went to the chiropractor yesterday morning. She has not been doing her abdominal exercises.     Currently in Pain? Yes   Pain Score 3    Pain Location Back   Pain Orientation Left   Pain Descriptors / Indicators Aching   Pain Type Chronic pain   Pain Radiating Towards Lt knee    Pain Onset More than a month ago   Pain Frequency Intermittent                         [facility] Adult PT Treatment/Exercise - 06/28/16 0001      Lumbar Exercises: Standing   Heel Raises 10 reps   Functional Squats 15 reps     Lumbar Exercises: Seated   Sit to Stand 10 reps     Lumbar Exercises: Supine   Other Supine Lumbar Exercises  decompression 1 -5     Lumbar Exercises: Sidelying   Clam 10 reps   Hip Abduction 10 reps   Hip Abduction Weights (lbs) 3     Lumbar Exercises: Prone   Straight Leg Raise 10 reps   Opposite Arm/Leg Raise Right arm/Left leg;Left arm/Right leg;10 reps             Balance Exercises - 06/28/16 1519      Balance Exercises: Standing   Tandem Stance Eyes open;3 reps  with head turns.    SLS --  Attempted but caused increased pain in Lt knee.            PT Education - 06/28/16 1524    Education provided Yes   Education Details decompression exercises    Person(s) Educated Patient   Comprehension Verbalized understanding          PT Short Term Goals - 06/14/16 1613      PT SHORT TERM GOAL #1   Title Pt Lt knee pain to be no greater than a 1/10 to allow pt to walk for 20 minutes without a limp.    Time 2   Period Weeks   Status New  PT SHORT TERM GOAL #2   Title Pt to be able to walk up steps without increased Lt knee pain    Time 2   Period Weeks   Status New     PT SHORT TERM GOAL #3   Title Pt to be able to stand for 30 mintues to be able to cook a meal without having to rest.    Time 2   Period Weeks           PT Long Term Goals - 06/14/16 1615      PT LONG TERM GOAL #1   Title Pt to be able to descend 2 flights of steps and descend inclines without increased Lt knee pain    Time 4   Period Weeks   Status New     PT LONG TERM GOAL #2   Title PT to be able to walk for 30 mintues without a limp or Lt knee pain    Time 4   Period Weeks   Status New     PT LONG TERM GOAL #3   Title Pt to be able to single leg stance on both LE for 30 seconds to allow pt to feel confident walking on uneven ground    Time 5   Period Weeks   Status New               Plan - 06/28/16 1521    Clinical Impression Statement Pt stating she is not doing her abdominal isometrics because she just can't feel anything.  Reviewed exercises with the addition of  decompression exercises as well as prone opposite arm leg lift. Stopped single leg stance (SLS) due to  SLS causeing  increased pain to Lt knee do not do if this continues.    Rehab Potential Good   PT Frequency 3x / week   PT Duration 4 weeks   PT Treatment/Interventions Patient/family education;Manual techniques;Therapeutic exercise;Balance training;Gait training   PT Next Visit Plan Progress lumbar stabilization including hip strengtheing exercises ; warrior poses for stability and balance.       Patient will benefit from skilled therapeutic intervention in order to improve the following deficits and impairments:  Abnormal gait, Decreased activity tolerance, Decreased balance, Pain, Decreased strength, Difficulty walking  Visit Diagnosis: Difficulty in walking, not elsewhere classified     Problem List Patient Active Problem List   Diagnosis Date Noted  . Medial meniscus, posterior horn derangement   . Arthritis of knee   . Primary osteoarthritis of left knee 04/05/2014  . Diastolic dysfunction    Rayetta Humphrey, [place] CLT [phone] 06/28/2016, 3:27 PM  [place], [place], [address] Phone: [phone]   Fax:  [phone]  Name: Laura Carter MRN: [id number] Date of Birth: 07-19-1940

## 2016-07-01 ENCOUNTER — Ambulatory Visit (HOSPITAL_COMMUNITY): Payer: Medicare Other | Admitting: Physical Therapy

## 2016-07-01 DIAGNOSIS — M25562 Pain in left knee: Secondary | ICD-10-CM

## 2016-07-01 DIAGNOSIS — G8929 Other chronic pain: Secondary | ICD-10-CM | POA: Diagnosis not present

## 2016-07-01 DIAGNOSIS — R2681 Unsteadiness on feet: Secondary | ICD-10-CM

## 2016-07-01 DIAGNOSIS — R262 Difficulty in walking, not elsewhere classified: Secondary | ICD-10-CM

## 2016-07-01 NOTE — Therapy (Signed)
[place], [place], [address] Phone: [phone]   Fax:  [phone]  Physical Therapy Treatment  Patient Details  Name: Laura Carter MRN: [id number] Date of Birth: September 13, 1940 Referring Provider: Arther Abbott   Encounter Date: 07/01/2016      PT End of Session - 07/01/16 1604    Visit Number 7   Number of Visits 12   Date for PT Re-Evaluation 07/14/16   Authorization - Visit Number 7   Authorization - Number of Visits 10   PT Start Time [id number]   PT Stop Time 1558   PT Time Calculation (min) 41 min   Activity Tolerance Patient tolerated treatment well   Behavior During Therapy [facility] for tasks assessed/performed      Past Medical History:  Diagnosis Date  . Asthma   . Diastolic dysfunction   . GERD (gastroesophageal reflux disease)   . Hypertension     Past Surgical History:  Procedure Laterality Date  . CHOLECYSTECTOMY  10/10  . KNEE ARTHROSCOPY WITH MEDIAL MENISECTOMY Left 06/16/2015   Procedure: KNEE ARTHROSCOPY WITH PARTIAL MEDIAL MENISECTOMY;  Surgeon: Carole Civil, MD;  Location: [facility];  Service: Orthopedics;  Laterality: Left;    There were no vitals filed for this visit.      Subjective Assessment - 07/01/16 1521    Subjective Patient arrives today stating that she is doing well, her pain is low today and she states that in her mind her biggest concern is her balance. She mostly has pain when she steps, it feels like a "catch" in her back.    Pertinent History arthroscopic surgery,    Patient Stated Goals core and LE to be stronger.    Currently in Pain? Yes   Pain Score 1    Pain Location Back   Pain Orientation Left   Pain Descriptors / Indicators Other (Comment)  catching   Pain Type Chronic pain   Pain Radiating Towards a little bit down towards L knee    Pain Onset More than a month ago   Pain Frequency Intermittent   Aggravating Factors  being in one position for a long  time, carrying weighted loads    Pain Relieving Factors rest   Effect of Pain on Daily Activities none, just annoying                          [facility] Adult PT Treatment/Exercise - 07/01/16 0001      Lumbar Exercises: Supine   Ab Set 15 reps   AB Set Limitations 3 second holds    Bent Knee Raise 15 reps   Bent Knee Raise Limitations with core set    Bridge 15 reps   Bridge Limitations staggered sets x2; 1 second holds    Other Supine Lumbar Exercises modified reverse crunch 1x10     Lumbar Exercises: Sidelying   Clam 15 reps   Clam Limitations red TB    Hip Abduction 15 reps   Hip Abduction Weights (lbs) 3             Balance Exercises - 07/01/16 1603      Balance Exercises: Standing   Standing Eyes Closed Narrow base of support (BOS);Foam/compliant surface;3 reps;30 secs   Tandem Stance Eyes open;3 reps;20 secs;Foam/compliant surface   Other Standing Exercises attempted anterior step strategy balance exercise however patient unable today due to trepedation, will require further practice  PT Education - 07/01/16 1603    Education provided Yes   Education Details clamshells with red TB    Person(s) Educated Patient   Methods Explanation;Handout;Demonstration   Comprehension Verbalized understanding;Need further instruction;Returned demonstration          PT Short Term Goals - 06/14/16 1613      PT SHORT TERM GOAL #1   Title Pt Lt knee pain to be no greater than a 1/10 to allow pt to walk for 20 minutes without a limp.    Time 2   Period Weeks   Status New     PT SHORT TERM GOAL #2   Title Pt to be able to walk up steps without increased Lt knee pain    Time 2   Period Weeks   Status New     PT SHORT TERM GOAL #3   Title Pt to be able to stand for 30 mintues to be able to cook a meal without having to rest.    Time 2   Period Weeks           PT Long Term Goals - 06/14/16 1615      PT LONG TERM GOAL #1   Title Pt to  be able to descend 2 flights of steps and descend inclines without increased Lt knee pain    Time 4   Period Weeks   Status New     PT LONG TERM GOAL #2   Title PT to be able to walk for 30 mintues without a limp or Lt knee pain    Time 4   Period Weeks   Status New     PT LONG TERM GOAL #3   Title Pt to be able to single leg stance on both LE for 30 seconds to allow pt to feel confident walking on uneven ground    Time 5   Period Weeks   Status New               Plan - 07/01/16 1604    Clinical Impression Statement Patient arrives today stating she is doing well, pain is low and mostly she just feels a "catching" when she is walking; she continues to report, and appears to be fixated, that she feels she will do much better in terms of her knee if she just has a TKR. Educated regarding ability to manage knee pain with proactive exercise/stretching, as well as 3 possible outcomes of surgery (better, worse, same). Otherwise performed lumbar stabilization and hip strengthening per PT POC, also worked on balance with available time left this session.    Rehab Potential Good   PT Frequency 3x / week   PT Duration 4 weeks   PT Treatment/Interventions Patient/family education;Manual techniques;Therapeutic exercise;Balance training;Gait training   PT Next Visit Plan Progress lumbar stabilization including hip strengtheing exercises ; warrior poses for stability and balance.    Consulted and Agree with Plan of Care Patient      Patient will benefit from skilled therapeutic intervention in order to improve the following deficits and impairments:  Abnormal gait, Decreased activity tolerance, Decreased balance, Pain, Decreased strength, Difficulty walking  Visit Diagnosis: Difficulty in walking, not elsewhere classified  Chronic pain of left knee  Unsteadiness on feet     Problem List Patient Active Problem List   Diagnosis Date Noted  . Medial meniscus, posterior horn  derangement   . Arthritis of knee   . Primary osteoarthritis of left knee 04/05/2014  .  Diastolic dysfunction     Deniece Ree PT, DPT [place] [address] [place], [place], [address] Phone: [phone]   Fax:  [phone]  Name: Laura Carter MRN: [id number] Date of Birth: 01-21-1941

## 2016-07-01 NOTE — Patient Instructions (Signed)
   ELASTIC BAND - SIDELYING CLAM-   While lying on your side with your knees bent and an elastic band wrapped around your knees, draw up the top knee while keeping contact of your feet together as shown.   Do not let your pelvis roll back during the lifting movement.    Repeat 10-15 times each side, 1-2 times per day with red band.

## 2016-07-03 ENCOUNTER — Ambulatory Visit (HOSPITAL_COMMUNITY): Payer: Medicare Other

## 2016-07-03 DIAGNOSIS — R262 Difficulty in walking, not elsewhere classified: Secondary | ICD-10-CM

## 2016-07-03 DIAGNOSIS — R2681 Unsteadiness on feet: Secondary | ICD-10-CM | POA: Diagnosis not present

## 2016-07-03 DIAGNOSIS — M25562 Pain in left knee: Secondary | ICD-10-CM | POA: Diagnosis not present

## 2016-07-03 DIAGNOSIS — G8929 Other chronic pain: Secondary | ICD-10-CM | POA: Diagnosis not present

## 2016-07-03 NOTE — Therapy (Signed)
[place], [place], [address] Phone: [phone]   Fax:  [phone]  Physical Therapy Treatment  Patient Details  Name: Laura Carter MRN: [id number] Date of Birth: 19-Oct-1940 Referring Provider: Arther Abbott   Encounter Date: 07/03/2016      PT End of Session - 07/03/16 1402    Visit Number 8   Number of Visits 12   Date for PT Re-Evaluation 07/14/16   Authorization - Visit Number 8   Authorization - Number of Visits 10   PT Start Time [id number]   PT Stop Time 1438   PT Time Calculation (min) 46 min   Activity Tolerance Patient tolerated treatment well;Patient limited by fatigue   Behavior During Therapy [facility] for tasks assessed/performed      Past Medical History:  Diagnosis Date  . Asthma   . Diastolic dysfunction   . GERD (gastroesophageal reflux disease)   . Hypertension     Past Surgical History:  Procedure Laterality Date  . CHOLECYSTECTOMY  10/10  . KNEE ARTHROSCOPY WITH MEDIAL MENISECTOMY Left 06/16/2015   Procedure: KNEE ARTHROSCOPY WITH PARTIAL MEDIAL MENISECTOMY;  Surgeon: Carole Civil, MD;  Location: [facility];  Service: Orthopedics;  Laterality: Left;    There were no vitals filed for this visit.      Subjective Assessment - 07/03/16 1354    Subjective Pt stated she continues to be compliant with HEP every morning working on balance.  Pt reports minimal pain on lower back   Pertinent History arthroscopic surgery,    Patient Stated Goals core and LE to be stronger.    Currently in Pain? Yes   Pain Score 2    Pain Location Back   Pain Orientation Lower   Pain Descriptors / Indicators --  strained   Pain Type Chronic pain   Pain Radiating Towards no radicular symptoms today   Pain Onset More than a month ago   Pain Frequency Intermittent   Aggravating Factors  being in one position for a long time, carrying weighted loads   Pain Relieving Factors rest   Effect of Pain on Daily  Activities none, just annoying                         [facility] Adult PT Treatment/Exercise - 07/03/16 0001      Lumbar Exercises: Standing   Heel Raises 15 reps   Heel Raises Limitations toe raises on slope 15reps   Functional Squats 15 reps   Other Standing Lumbar Exercises warrior pose I and II 2x30" each     Lumbar Exercises: Seated   Sit to Stand 10 reps   Sit to Stand Limitations no UE's     Lumbar Exercises: Sidelying   Hip Abduction 15 reps   Hip Abduction Weights (lbs) 3     Lumbar Exercises: Prone   Opposite Arm/Leg Raise Right arm/Left leg;Left arm/Right leg;10 reps             Balance Exercises - 07/03/16 1444      Balance Exercises: Standing   Tandem Stance Eyes open;3 reps;20 secs;Foam/compliant surface   SLS --  held due to Lt knee pain   Balance Beam sidestepping 2RT, tandem 2 RT             PT Short Term Goals - 06/14/16 1613      PT SHORT TERM GOAL #1   Title Pt Lt knee pain to be  no greater than a 1/10 to allow pt to walk for 20 minutes without a limp.    Time 2   Period Weeks   Status New     PT SHORT TERM GOAL #2   Title Pt to be able to walk up steps without increased Lt knee pain    Time 2   Period Weeks   Status New     PT SHORT TERM GOAL #3   Title Pt to be able to stand for 30 mintues to be able to cook a meal without having to rest.    Time 2   Period Weeks           PT Long Term Goals - 06/14/16 1615      PT LONG TERM GOAL #1   Title Pt to be able to descend 2 flights of steps and descend inclines without increased Lt knee pain    Time 4   Period Weeks   Status New     PT LONG TERM GOAL #2   Title PT to be able to walk for 30 mintues without a limp or Lt knee pain    Time 4   Period Weeks   Status New     PT LONG TERM GOAL #3   Title Pt to be able to single leg stance on both LE for 30 seconds to allow pt to feel confident walking on uneven ground    Time 5   Period Weeks   Status New                Plan - 07/03/16 1445    Clinical Impression Statement Pt progressing well with reports of complaince with HEP daily and pain minimal today.  Continued session focus on improving lumbar stabilization, proximal strengthening and balance activities complete with BLE WB as precaution for Lt knee pain.  Progressed to warrior I and II poses for lumbar stabilizaiton and balance training wiht min guard for safety.  No reports of increased pain through session, was limited by fatigue.     Rehab Potential Good   PT Frequency 3x / week   PT Duration 4 weeks   PT Treatment/Interventions Patient/family education;Manual techniques;Therapeutic exercise;Balance training;Gait training   PT Next Visit Plan Progress lumbar stabilization including hip strengtheing exercises ; warrior poses for stability and balance training with BLE WB for Lt knee pain control.         Patient will benefit from skilled therapeutic intervention in order to improve the following deficits and impairments:  Abnormal gait, Decreased activity tolerance, Decreased balance, Pain, Decreased strength, Difficulty walking  Visit Diagnosis: Difficulty in walking, not elsewhere classified  Chronic pain of left knee  Unsteadiness on feet     Problem List Patient Active Problem List   Diagnosis Date Noted  . Medial meniscus, posterior horn derangement   . Arthritis of knee   . Primary osteoarthritis of left knee 04/05/2014  . Diastolic dysfunction    Ihor Austin, LPTA; CBIS [phone]  Aldona Lento 07/03/2016, 2:57 PM  [place], [place], [address] Phone: [phone]   Fax:  [phone]  Name: INA RIEVES MRN: [id number] Date of Birth: 12/17/1940

## 2016-07-05 ENCOUNTER — Ambulatory Visit (HOSPITAL_COMMUNITY): Payer: Medicare Other

## 2016-07-05 DIAGNOSIS — G8929 Other chronic pain: Secondary | ICD-10-CM | POA: Diagnosis not present

## 2016-07-05 DIAGNOSIS — M25562 Pain in left knee: Secondary | ICD-10-CM | POA: Diagnosis not present

## 2016-07-05 DIAGNOSIS — R262 Difficulty in walking, not elsewhere classified: Secondary | ICD-10-CM

## 2016-07-05 DIAGNOSIS — R2681 Unsteadiness on feet: Secondary | ICD-10-CM

## 2016-07-05 NOTE — Therapy (Signed)
[place], [place], [address] Phone: [phone]   Fax:  [phone]  Physical Therapy Treatment  Patient Details  Name: Laura Carter MRN: [id number] Date of Birth: Aug 23, 1940 Referring Provider: Arther Abbott   Encounter Date: 07/05/2016      PT End of Session - 07/05/16 1442    Visit Number 9   Number of Visits 12   Date for PT Re-Evaluation 07/14/16   Authorization - Visit Number 9   Authorization - Number of Visits 10   PT Start Time [id number]   PT Stop Time 1514   PT Time Calculation (min) 41 min   Activity Tolerance Patient tolerated treatment well;Patient limited by fatigue   Behavior During Therapy [facility] for tasks assessed/performed      Past Medical History:  Diagnosis Date  . Asthma   . Diastolic dysfunction   . GERD (gastroesophageal reflux disease)   . Hypertension     Past Surgical History:  Procedure Laterality Date  . CHOLECYSTECTOMY  10/10  . KNEE ARTHROSCOPY WITH MEDIAL MENISECTOMY Left 06/16/2015   Procedure: KNEE ARTHROSCOPY WITH PARTIAL MEDIAL MENISECTOMY;  Surgeon: Carole Civil, MD;  Location: [facility];  Service: Orthopedics;  Laterality: Left;    There were no vitals filed for this visit.      Subjective Assessment - 07/05/16 1440    Subjective Pt stated very minimal LBP maybe 1/10 with main difficulty with balance.     Pertinent History arthroscopic surgery,    Patient Stated Goals core and LE to be stronger.    Currently in Pain? Yes   Pain Score 1    Pain Orientation Lower   Pain Descriptors / Indicators Sore   Pain Type Chronic pain   Pain Radiating Towards no radicular symtoms today   Pain Onset More than a month ago   Pain Frequency Intermittent   Aggravating Factors  being in one position for a long time, carrying weighted loads   Pain Relieving Factors rest   Effect of Pain on Daily Activities none, just annoying                         [facility]  Adult PT Treatment/Exercise - 07/05/16 0001      Lumbar Exercises: Standing   Heel Raises 15 reps   Heel Raises Limitations toe raises on slope 15reps   Functional Squats 15 reps   Other Standing Lumbar Exercises warrior pose I and II 2x30" each     Lumbar Exercises: Sidelying   Hip Abduction 15 reps   Hip Abduction Weights (lbs) 3     Lumbar Exercises: Prone   Opposite Arm/Leg Raise Right arm/Left leg;Left arm/Right leg;10 reps   Other Prone Lumbar Exercises rows and shoulder extension 10x             Balance Exercises - 07/05/16 1511      Balance Exercises: Standing   Tandem Stance 3 reps;30 secs;Foam/compliant surface   SLS --  held due to Lt knee pan   SLS with Vectors 5 reps;Upper extremity assist 1   Balance Beam sidestepping 2RT, tandem 2 RT             PT Short Term Goals - 06/14/16 1613      PT SHORT TERM GOAL #1   Title Pt Lt knee pain to be no greater than a 1/10 to allow pt to walk for 20 minutes without a limp.  Time 2   Period Weeks   Status New     PT SHORT TERM GOAL #2   Title Pt to be able to walk up steps without increased Lt knee pain    Time 2   Period Weeks   Status New     PT SHORT TERM GOAL #3   Title Pt to be able to stand for 30 mintues to be able to cook a meal without having to rest.    Time 2   Period Weeks           PT Long Term Goals - 06/14/16 1615      PT LONG TERM GOAL #1   Title Pt to be able to descend 2 flights of steps and descend inclines without increased Lt knee pain    Time 4   Period Weeks   Status New     PT LONG TERM GOAL #2   Title PT to be able to walk for 30 mintues without a limp or Lt knee pain    Time 4   Period Weeks   Status New     PT LONG TERM GOAL #3   Title Pt to be able to single leg stance on both LE for 30 seconds to allow pt to feel confident walking on uneven ground    Time 5   Period Weeks   Status New               Plan - 07/05/16 1527    Clinical Impression  Statement Continued session foucs on improving lumbar stabilization and balance training for core strengthening.  Added prone posture strengthening exercises.  Pt able to complete all exercises with minimal cueing for form.  Improved stability noted with warrior poses with min guard for balance today.  No reports of increased pain through session.  Was limited by fatigue with activities.   Rehab Potential Good   PT Frequency 3x / week   PT Duration 4 weeks   PT Treatment/Interventions Patient/family education;Manual techniques;Therapeutic exercise;Balance training;Gait training   PT Next Visit Plan Progress lumbar stabilization including hip strengtheing exercises ; warrior poses for stability and balance training with BLE WB for Lt knee pain control.         Patient will benefit from skilled therapeutic intervention in order to improve the following deficits and impairments:  Abnormal gait, Decreased activity tolerance, Decreased balance, Pain, Decreased strength, Difficulty walking  Visit Diagnosis: Difficulty in walking, not elsewhere classified  Chronic pain of left knee  Unsteadiness on feet     Problem List Patient Active Problem List   Diagnosis Date Noted  . Medial meniscus, posterior horn derangement   . Arthritis of knee   . Primary osteoarthritis of left knee 04/05/2014  . Diastolic dysfunction    Ihor Austin, LPTA; CBIS [phone]  Aldona Lento 07/05/2016, 3:37 PM  [place], [place], [address] Phone: [phone]   Fax:  [phone]  Name: Laura Carter MRN: [id number] Date of Birth: 01/01/1941

## 2016-07-08 ENCOUNTER — Ambulatory Visit (HOSPITAL_COMMUNITY): Payer: Medicare Other

## 2016-07-08 DIAGNOSIS — R2681 Unsteadiness on feet: Secondary | ICD-10-CM

## 2016-07-08 DIAGNOSIS — M25562 Pain in left knee: Secondary | ICD-10-CM

## 2016-07-08 DIAGNOSIS — G8929 Other chronic pain: Secondary | ICD-10-CM | POA: Diagnosis not present

## 2016-07-08 DIAGNOSIS — R262 Difficulty in walking, not elsewhere classified: Secondary | ICD-10-CM

## 2016-07-08 NOTE — Therapy (Signed)
[place], [place], [address] Phone: [phone]   Fax:  [phone]  Physical Therapy Treatment  Patient Details  Name: Laura Carter MRN: [id number] Date of Birth: 1940/06/05 Referring Provider: Arther Abbott   Encounter Date: 07/08/2016      PT End of Session - 07/08/16 1404    Visit Number 10   Number of Visits 12   Date for PT Re-Evaluation 07/14/16   Authorization - Visit Number 10   Authorization - Number of Visits 10   PT Start Time [id number]   PT Stop Time 1427   PT Time Calculation (min) 38 min   Activity Tolerance Patient tolerated treatment well;Patient limited by fatigue   Behavior During Therapy [facility] for tasks assessed/performed      Past Medical History:  Diagnosis Date  . Asthma   . Diastolic dysfunction   . GERD (gastroesophageal reflux disease)   . Hypertension     Past Surgical History:  Procedure Laterality Date  . CHOLECYSTECTOMY  10/10  . KNEE ARTHROSCOPY WITH MEDIAL MENISECTOMY Left 06/16/2015   Procedure: KNEE ARTHROSCOPY WITH PARTIAL MEDIAL MENISECTOMY;  Surgeon: Carole Civil, MD;  Location: [facility];  Service: Orthopedics;  Laterality: Left;    There were no vitals filed for this visit.      Subjective Assessment - 07/08/16 1358    Subjective Pt doin gfair today. She says the warm rainy weather is making her feel sleepy and hypoactive. Her Lt knee was stiff and sore bu tis better now after an Advil.    Pertinent History arthroscopic surgery,    Currently in Pain? No/denies                         [facility] Adult PT Treatment/Exercise - 07/08/16 0001      Lumbar Exercises: Standing   Heel Raises 20 reps   Heel Raises Limitations toe raises on slope 20 reps   Functional Squats 15 reps   Other Standing Lumbar Exercises warrior pose I and II 2x30" each   Other Standing Lumbar Exercises SLS: 15x3sH alt bilat     Lumbar Exercises: Sidelying   Hip Abduction  15 reps   Hip Abduction Weights (lbs) 3     Lumbar Exercises: Prone   Opposite Arm/Leg Raise Right arm/Left leg;Left arm/Right leg;10 reps   Other Prone Lumbar Exercises rows and shoulder extension 10x             Balance Exercises - 07/08/16 1402      Balance Exercises: Standing   Tandem Stance 30 secs;Foam/compliant surface;5 reps   SLS --  held due to Lt knee pan   Balance Beam sidestepping 4RT c 3lb cuffs on ankles             PT Short Term Goals - 06/14/16 1613      PT SHORT TERM GOAL #1   Title Pt Lt knee pain to be no greater than a 1/10 to allow pt to walk for 20 minutes without a limp.    Time 2   Period Weeks   Status New     PT SHORT TERM GOAL #2   Title Pt to be able to walk up steps without increased Lt knee pain    Time 2   Period Weeks   Status New     PT SHORT TERM GOAL #3   Title Pt to be able to stand for 30 mintues  to be able to cook a meal without having to rest.    Time 2   Period Weeks           PT Long Term Goals - 06/14/16 1615      PT LONG TERM GOAL #1   Title Pt to be able to descend 2 flights of steps and descend inclines without increased Lt knee pain    Time 4   Period Weeks   Status New     PT LONG TERM GOAL #2   Title PT to be able to walk for 30 mintues without a limp or Lt knee pain    Time 4   Period Weeks   Status New     PT LONG TERM GOAL #3   Title Pt to be able to single leg stance on both LE for 30 seconds to allow pt to feel confident walking on uneven ground    Time 5   Period Weeks   Status New               Plan - 07/08/16 1412    Clinical Impression Statement Pt continues to demonstrate improvement and progress toward goals. Balanc eexercises are perfomred first this session. Able to progress balance and weights this session with desired response. No changes to POC at this time.    Rehab Potential Good   PT Frequency 3x / week   PT Duration 4 weeks   PT Treatment/Interventions  Patient/family education;Manual techniques;Therapeutic exercise;Balance training;Gait training   PT Next Visit Plan Continue to progress lumbar stabilization including hip strengtheing exercises ; warrior poses for stability and balance training with BLE WB for Lt knee pain control.         Patient will benefit from skilled therapeutic intervention in order to improve the following deficits and impairments:  Abnormal gait, Decreased activity tolerance, Decreased balance, Pain, Decreased strength, Difficulty walking  Visit Diagnosis: Difficulty in walking, not elsewhere classified  Chronic pain of left knee  Unsteadiness on feet     Problem List Patient Active Problem List   Diagnosis Date Noted  . Medial meniscus, posterior horn derangement   . Arthritis of knee   . Primary osteoarthritis of left knee 04/05/2014  . Diastolic dysfunction     Laden Fieldhouse C 07/08/2016, 2:21 PM  [place] [address] [place], [place], [address] Phone: [phone]   Fax:  [phone]  Name: SACHA HA MRN: [id number] Date of Birth: November 21, 1940   2:22 PM, 07/08/16 Etta Grandchild, PT, DPT Physical Therapist at [facility] [phone] (office)

## 2016-07-10 ENCOUNTER — Ambulatory Visit (HOSPITAL_COMMUNITY): Payer: Medicare Other | Admitting: Physical Therapy

## 2016-07-10 ENCOUNTER — Telehealth (HOSPITAL_COMMUNITY): Payer: Self-pay | Admitting: Family Medicine

## 2016-07-10 NOTE — Telephone Encounter (Signed)
07/10/16 cx today because she is going to be with her daughter for a court date.  Wants to reschedule for 2/15 before 4 if anything opens up

## 2016-07-11 ENCOUNTER — Ambulatory Visit (HOSPITAL_COMMUNITY): Payer: Medicare Other | Admitting: Physical Therapy

## 2016-07-11 DIAGNOSIS — G8929 Other chronic pain: Secondary | ICD-10-CM | POA: Diagnosis not present

## 2016-07-11 DIAGNOSIS — M25562 Pain in left knee: Secondary | ICD-10-CM | POA: Diagnosis not present

## 2016-07-11 DIAGNOSIS — R262 Difficulty in walking, not elsewhere classified: Secondary | ICD-10-CM

## 2016-07-11 DIAGNOSIS — R2681 Unsteadiness on feet: Secondary | ICD-10-CM

## 2016-07-11 NOTE — Therapy (Signed)
[place], [place], [address] Phone: [phone]   Fax:  [phone]  Physical Therapy Treatment  Patient Details  Name: Laura Carter MRN: [id number] Date of Birth: Oct 19, 1940 Referring Provider: Arther Abbott   Encounter Date: 07/11/2016      PT End of Session - 07/11/16 1626    Visit Number 11   Number of Visits 12   Date for PT Re-Evaluation 07/14/16   Authorization - Visit Number 11   Authorization - Number of Visits 10   PT Start Time [id number]   PT Stop Time 1440   PT Time Calculation (min) 45 min   Activity Tolerance Patient tolerated treatment well;Patient limited by fatigue   Behavior During Therapy [facility] for tasks assessed/performed      Past Medical History:  Diagnosis Date  . Asthma   . Diastolic dysfunction   . GERD (gastroesophageal reflux disease)   . Hypertension     Past Surgical History:  Procedure Laterality Date  . CHOLECYSTECTOMY  10/10  . KNEE ARTHROSCOPY WITH MEDIAL MENISECTOMY Left 06/16/2015   Procedure: KNEE ARTHROSCOPY WITH PARTIAL MEDIAL MENISECTOMY;  Surgeon: Carole Civil, MD;  Location: [facility];  Service: Orthopedics;  Laterality: Left;    There were no vitals filed for this visit.      Subjective Assessment - 07/11/16 1404    Subjective Pt states she thinks alot of her issues are due to arthritis.  Currently without pain, states it went away and she didnt' have to take any meds.   Currently in Pain? No/denies                         [facility] Adult PT Treatment/Exercise - 07/11/16 0001      Lumbar Exercises: Standing   Heel Raises 20 reps   Heel Raises Limitations toe raises on slope 20 reps   Functional Squats 15 reps   Other Standing Lumbar Exercises SLS: 15x3sH alt bilat     Lumbar Exercises: Prone   Opposite Arm/Leg Raise Right arm/Left leg;Left arm/Right leg;10 reps   Other Prone Lumbar Exercises rows and shoulder extension 10x              Balance Exercises - 07/11/16 1405      Balance Exercises: Standing   Tandem Stance 30 secs;Foam/compliant surface;3 reps   SLS with Vectors 5 reps;Upper extremity assist 1             PT Short Term Goals - 06/14/16 1613      PT SHORT TERM GOAL #1   Title Pt Lt knee pain to be no greater than a 1/10 to allow pt to walk for 20 minutes without a limp.    Time 2   Period Weeks   Status New     PT SHORT TERM GOAL #2   Title Pt to be able to walk up steps without increased Lt knee pain    Time 2   Period Weeks   Status New     PT SHORT TERM GOAL #3   Title Pt to be able to stand for 30 mintues to be able to cook a meal without having to rest.    Time 2   Period Weeks           PT Long Term Goals - 06/14/16 1615      PT LONG TERM GOAL #1   Title Pt to be able to descend  2 flights of steps and descend inclines without increased Lt knee pain    Time 4   Period Weeks   Status New     PT LONG TERM GOAL #2   Title PT to be able to walk for 30 mintues without a limp or Lt knee pain    Time 4   Period Weeks   Status New     PT LONG TERM GOAL #3   Title Pt to be able to single leg stance on both LE for 30 seconds to allow pt to feel confident walking on uneven ground    Time 5   Period Weeks   Status New               Plan - 07/11/16 1627    Clinical Impression Statement Began session with standing strengtheing and balance actvities.  Pt began to c/o Lt knee discomfort following so completed session with mat actvities.  Pt reported no pain in knee at end of session.  Pt reports overall improvement, however continues to have discomfort in knee that comes and goes with actvities feeling this is mostly related to arthritis.  Instructed to contiue HEP.     Rehab Potential Good   PT Frequency 3x / week   PT Duration 4 weeks   PT Treatment/Interventions Patient/family education;Manual techniques;Therapeutic exercise;Balance training;Gait training    PT Next Visit Plan Re-evaluate next session.      Patient will benefit from skilled therapeutic intervention in order to improve the following deficits and impairments:  Abnormal gait, Decreased activity tolerance, Decreased balance, Pain, Decreased strength, Difficulty walking  Visit Diagnosis: Difficulty in walking, not elsewhere classified  Chronic pain of left knee  Unsteadiness on feet     Problem List Patient Active Problem List   Diagnosis Date Noted  . Medial meniscus, posterior horn derangement   . Arthritis of knee   . Primary osteoarthritis of left knee 04/05/2014  . Diastolic dysfunction     Teena Irani, PTA/CLT [phone]  07/11/2016, 4:32 PM  [place], [place], [address] Phone: [phone]   Fax:  [phone]  Name: Laura Carter MRN: [id number] Date of Birth: 01-20-1941

## 2016-07-12 ENCOUNTER — Ambulatory Visit (HOSPITAL_COMMUNITY): Payer: Medicare Other | Admitting: Physical Therapy

## 2016-07-12 DIAGNOSIS — G8929 Other chronic pain: Secondary | ICD-10-CM

## 2016-07-12 DIAGNOSIS — M25562 Pain in left knee: Secondary | ICD-10-CM | POA: Diagnosis not present

## 2016-07-12 DIAGNOSIS — R2681 Unsteadiness on feet: Secondary | ICD-10-CM | POA: Diagnosis not present

## 2016-07-12 DIAGNOSIS — R262 Difficulty in walking, not elsewhere classified: Secondary | ICD-10-CM

## 2016-07-12 NOTE — Therapy (Addendum)
[place], [place], [address] Phone: [phone]   Fax:  [phone]  Physical Therapy Treatment  Patient Details  Name: Laura Carter MRN: [id number] Date of Birth: May 08, 1941 Referring Provider: Arther Abbott   Encounter Date: 07/12/2016      PT End of Session - 07/12/16 1519    Visit Number 12   Number of Visits 16   Date for PT Re-Evaluation 07/14/16   Authorization Type reassess done 2/16 Gcode done on visit 10   Authorization - Visit Number 12   Authorization - Number of Visits 16   PT Start Time 1430   PT Stop Time 1512   PT Time Calculation (min) 42 min   Activity Tolerance Patient tolerated treatment well;Patient limited by fatigue   Behavior During Therapy [facility] for tasks assessed/performed      Past Medical History:  Diagnosis Date  . Asthma   . Diastolic dysfunction   . GERD (gastroesophageal reflux disease)   . Hypertension     Past Surgical History:  Procedure Laterality Date  . CHOLECYSTECTOMY  10/10  . KNEE ARTHROSCOPY WITH MEDIAL MENISECTOMY Left 06/16/2015   Procedure: KNEE ARTHROSCOPY WITH PARTIAL MEDIAL MENISECTOMY;  Surgeon: Carole Civil, MD;  Location: [facility];  Service: Orthopedics;  Laterality: Left;    There were no vitals filed for this visit.      Subjective Assessment - 07/12/16 1434    Subjective PT states that she has improved but is still having difficulty with her balance    Pertinent History arthroscopic surgery,    How long can you sit comfortably? no problem    How long can you stand comfortably? Pt able to stand for 20-25 minutes was 15.    How long can you walk comfortably? PT has not gone directly for a walk but she has walked 20 minutes at one time.    Patient Stated Goals core and LE to be stronger.    Currently in Pain? No/denies   Pain Onset More than a month ago            [facility] PT Assessment - 07/12/16 0001      Assessment   Medical  Diagnosis Lumbar stenosis   Referring Provider Arther Abbott    Onset Date/Surgical Date 06/16/15  for arthroscopic surgery on the Lt knee   Next MD Visit 07/08/2016  d/c     Precautions   Precautions None     Restrictions   Weight Bearing Restrictions No     Balance Screen   Has the patient fallen in the past 6 months No   Has the patient had a decrease in activity level because of a fear of falling?  No   Is the patient reluctant to leave their home because of a fear of falling?  No     Prior Function   Level of Independence Independent;Independent with basic ADLs;Independent with gait;Independent with transfers   Vocation Retired   Leisure yoga, massages, walking      Functional Tests   Functional tests Single leg stance;Sit to Stand     Single Leg Stance   Comments Rt: 30"; Lt:7  Rt was 27, Lt was 11      Sit to Stand   Comments 5x 14.52 was 15.44      AROM   Left Knee Extension 0   Left Knee Flexion 130   Lumbar Flexion wfl  reps increases pain pulling self up  Lumbar Extension 35 reps increases painl  was 35      Strength   Overall Strength --  abdominal 3-/5    Right Hip Flexion 5/5   Right Hip Extension 5/5  was 4+/5    Right Hip ABduction 5/5   Left Hip Flexion 5/5   Left Hip Extension 4/5  was3/5   Left Hip ABduction --  was 4+/5    Right Knee Flexion 5/5   Right Knee Extension 5/5   Left Knee Flexion 5/5   Left Knee Extension 5/5   Right Ankle Dorsiflexion 5/5   Left Ankle Dorsiflexion 5/5     Transfers   Five time sit to stand comments  --     Ambulation/Gait   Stairs --   Stairs Assistance --   Stair Management Technique --  cuing from therapist for continuous alternating pattern   Number of Stairs --   Gait Comments --     High Level Balance   High Level Balance Comments --                     [facility] Adult PT Treatment/Exercise - 07/12/16 0001      Lumbar Exercises: Standing   Scapular Retraction  Strengthening;Both;10 reps   Row Strengthening;Both;10 reps   Shoulder Extension Strengthening;Both;10 reps     Lumbar Exercises: Prone   Opposite Arm/Leg Raise Right arm/Left leg;Left arm/Right leg;15 reps   Other Prone Lumbar Exercises rows and shoulder extension x15 2#      Heel Raises x 10 Functional strength x 10 Lunges x 10 B         Balance Exercises - 07/11/16 1405      Balance Exercises: Standing   Tandem Stance 30 secs;Foam/compliant surface;3 reps   SLS with Vectors 5 reps;Upper extremity assist 1           PT Education - 07/12/16 1518    Education provided Yes   Education Details New HEP    Person(s) Educated Patient   Methods Explanation;Handout   Comprehension Verbalized understanding;Returned demonstration      [phone]  CI [id number]  CI      PT Short Term Goals - 07/12/16 1523      PT SHORT TERM GOAL #1   Title Pt Lt knee pain to be no greater than a 1/10 to allow pt to walk for 20 minutes without a limp.    Time 2   Period Weeks   Status Achieved     PT SHORT TERM GOAL #2   Title Pt to be able to walk up steps without increased Lt knee pain    Time 2   Period Weeks   Status Partially Met     PT SHORT TERM GOAL #3   Title Pt to be able to stand for 30 mintues to be able to cook a meal without having to rest.    Time 2   Period Weeks   Status Achieved           PT Long Term Goals - 07/12/16 1524      PT LONG TERM GOAL #1   Title Pt to be able to descend 2 flights of steps and descend inclines without increased Lt knee pain    Time 4   Period Weeks   Status On-going     PT LONG TERM GOAL #2   Title PT to be able to walk for 30 mintues without a limp or Lt knee pain  Time 4   Period Weeks   Status On-going     PT LONG TERM GOAL #3   Title Pt to be able to single leg stance on both LE for 30 seconds to allow pt to feel confident walking on uneven ground    Time 5   Period Weeks   Status On-going               Plan  - 07/12/16 1520    Clinical Impression Statement Pt reassessed with some mm strength improvement.  Pt states that she feels more stable when walking and is not holding onto the walls as much at home compared to prior to therapy.  She desires to continue therapy for a few more weeks to ensure that she is completing all the exercises correctly which I feel is appropriate.  She will return to the MD on Tuesday to see if he is in agreement.    Rehab Potential Good   PT Frequency 3x / week   PT Duration 6 weeks   PT Treatment/Interventions Patient/family education;Manual techniques;Therapeutic exercise;Balance training;Gait training   PT Next Visit Plan Continue for two more weeks focusing on higher standing stability activity to include warrior poses    Consulted and Agree with Plan of Care Patient      Patient will benefit from skilled therapeutic intervention in order to improve the following deficits and impairments:  Abnormal gait, Decreased activity tolerance, Decreased balance, Pain, Decreased strength, Difficulty walking  Visit Diagnosis: Difficulty in walking, not elsewhere classified - Plan: PT plan of care cert/re-cert  Chronic pain of left knee - Plan: PT plan of care cert/re-cert  Unsteadiness on feet - Plan: PT plan of care cert/re-cert     Problem List Patient Active Problem List   Diagnosis Date Noted  . Medial meniscus, posterior horn derangement   . Arthritis of knee   . Primary osteoarthritis of left knee 04/05/2014  . Diastolic dysfunction     Rayetta Humphrey, [place] CLT [phone] 07/12/2016, 3:27 PM  [place], [place], [address] Phone: [phone]   Fax:  [phone]  Name: Laura Carter MRN: [id number] Date of Birth: 02-Sep-1940  PHYSICAL THERAPY DISCHARGE SUMMARY  Visits from Start of Care: 12  Current functional level related to goals / functional outcomes: See above   Remaining  deficits: See above    Education / Equipment: HEP  Plan: Patient agrees to discharge.  Patient goals were partially met. Patient is being discharged due to meeting the stated rehab goals.  ?????        Rayetta Humphrey, [place] CLT [phone]

## 2016-07-12 NOTE — Patient Instructions (Addendum)
Functional Quadriceps: Chair Squat    Keeping feet flat on floor, shoulder width apart, squat as low as is comfortable. Use support as necessary. Repeat ____ times per set. Do ____ sets per session. Do ____ sessions per day.  http://orth.exer.us/736   Copyright  VHI. All rights reserved.  Heel Raise: Bilateral (Standing)    Rise on balls of feet. Repeat _10___ times per set. Do __1__ sets per session. Do _2__ sessions per day.  http://orth.exer.us/38   Copyright  VHI. All rights reserved.  Forward Lunge    Standing with feet shoulder width apart and stomach tight, step forward with left leg. Repeat _10___ times per set. Do ____ sets per session. Do ___2_ sessions per day.  http://orth.exer.us/1146   Copyright  VHI. All rights reserved.

## 2016-07-16 ENCOUNTER — Ambulatory Visit (INDEPENDENT_AMBULATORY_CARE_PROVIDER_SITE_OTHER): Payer: Medicare Other | Admitting: Orthopedic Surgery

## 2016-07-16 DIAGNOSIS — M1712 Unilateral primary osteoarthritis, left knee: Secondary | ICD-10-CM | POA: Diagnosis not present

## 2016-07-16 DIAGNOSIS — M48061 Spinal stenosis, lumbar region without neurogenic claudication: Secondary | ICD-10-CM

## 2016-07-16 NOTE — Progress Notes (Signed)
FOLLOW UP VISIT   Patient ID: Laura Carter, female   DOB: [birth year], 76 y.o.   MRN: [id number]  Chief complaint recheck left knee for arthritis and lumbar spine for spinal stenosis  HPI Laura Carter is a 76 y.o. female.   HPI  The patient was doing well since her last visit until she walked and shop for 8 hours and then had some pain in the left knee. She is tolerating therapy well improving in terms of her spinal stabilization and is doing reasonably well except for high stress activities as shopping for 8 hours and occasionally when she's bringing the groceries.    Review of Systems Review of Systems  Constitutional: Negative for fever and unexpected weight change.  Gastrointestinal: Negative.   Genitourinary: Negative.   Neurological: Negative for numbness.    Physical Exam  Constitutional: She is oriented to person, place, and time. She appears well-developed and well-nourished. No distress.  Cardiovascular: Normal rate and intact distal pulses.   Neurological: She is alert and oriented to person, place, and time. She has normal reflexes. She exhibits normal muscle tone. Coordination normal.  Skin: Skin is warm and dry. No rash noted. She is not diaphoretic. No erythema. No pallor.  Psychiatric: She has a normal mood and affect. Her behavior is normal. Judgment and thought content normal.    The knee looks good there is no swelling she can flex the knee past 125 comes to full extension is no instability MEDICAL DECISION MAKING  DATA   Physical therapy notes confirmed by the patient's progress although slow  DIAGNOSIS  Encounter Diagnoses  Name Primary?  . Spinal stenosis of lumbar region, unspecified whether neurogenic claudication present Yes  . Primary osteoarthritis of left knee      PLAN(RISK)    Continue physical therapy. I still don't think she's having enough pain and loss of function have knee replacement  Follow-up 2 months

## 2016-07-17 ENCOUNTER — Ambulatory Visit (HOSPITAL_COMMUNITY): Payer: Medicare Other | Admitting: Physical Therapy

## 2016-07-17 ENCOUNTER — Ambulatory Visit: Payer: Medicare Other | Admitting: Orthopedic Surgery

## 2016-07-19 ENCOUNTER — Encounter (HOSPITAL_COMMUNITY): Payer: Medicare Other

## 2016-07-29 ENCOUNTER — Telehealth (HOSPITAL_COMMUNITY): Payer: Self-pay | Admitting: Physical Therapy

## 2016-07-29 NOTE — Telephone Encounter (Signed)
Pt wants to come back to PT if Dr. [name] sends referral. Patient will call Dr. Ruthe Mannan office to see if they are sending a referrral. NF

## 2016-09-13 ENCOUNTER — Ambulatory Visit (INDEPENDENT_AMBULATORY_CARE_PROVIDER_SITE_OTHER): Payer: Medicare Other | Admitting: Orthopedic Surgery

## 2016-09-13 ENCOUNTER — Encounter: Payer: Self-pay | Admitting: Orthopedic Surgery

## 2016-09-13 DIAGNOSIS — M48061 Spinal stenosis, lumbar region without neurogenic claudication: Secondary | ICD-10-CM | POA: Diagnosis not present

## 2016-09-13 DIAGNOSIS — M1712 Unilateral primary osteoarthritis, left knee: Secondary | ICD-10-CM

## 2016-09-13 DIAGNOSIS — Z9889 Other specified postprocedural states: Secondary | ICD-10-CM

## 2016-09-13 MED ORDER — GABAPENTIN 100 MG PO CAPS: 100.0000 mg | ORAL_CAPSULE | Freq: Every day | ORAL | 0 refills | Status: DC

## 2016-09-13 NOTE — Progress Notes (Signed)
Follow-up visit  Patient has diagnosis of osteoarthritis left knee and spinal stenosis lumbar spine  Her complaints are pain along the lateral portion of the knee and thigh and posterior pain in the knee joint  She's not having any back pain at this point. She was complaining of stiffness in her left knee after sitting for long periods of time and trouble going down the steps but not going up  Review of systems she has no other complaints at this time in the musculoskeletal skeletal system or any nerve related complaints.  Exam shows no tenderness in her knee joint and there is no effusion she still has easy range of motion in flexion with 120. Ligaments are stable. She has no neurovascular deficits skin is intact. She is awake alert and oriented 3 mood and affect are normal she is able torn without any assistive devices  She still seems to have more pain unrelated to the knee joint or arthritis and I'm going to start her on gabapentin 100 mg at night and see her again in 4 weeks  No diagnosis found.

## 2016-10-04 ENCOUNTER — Ambulatory Visit (INDEPENDENT_AMBULATORY_CARE_PROVIDER_SITE_OTHER): Payer: Medicare Other | Admitting: Family Medicine

## 2016-10-04 ENCOUNTER — Encounter: Payer: Self-pay | Admitting: Family Medicine

## 2016-10-04 ENCOUNTER — Other Ambulatory Visit: Payer: Self-pay | Admitting: Family Medicine

## 2016-10-04 VITALS — BP 128/80 | HR 72 | Temp 98.5°F | Resp 14 | Ht 65.0 in | Wt 183.0 lb

## 2016-10-04 DIAGNOSIS — R5382 Chronic fatigue, unspecified: Secondary | ICD-10-CM | POA: Diagnosis not present

## 2016-10-04 LAB — CBC WITH DIFFERENTIAL/PLATELET
BASOS ABS: 0 {cells}/uL (ref 0–200)
Basophils Relative: 0 %
EOS PCT: 4 %
Eosinophils Absolute: 184 cells/uL (ref 15–500)
HCT: 41.7 % (ref 35.0–45.0)
HEMOGLOBIN: 13.6 g/dL (ref 12.0–15.0)
LYMPHS ABS: 1288 {cells}/uL (ref 850–3900)
Lymphocytes Relative: 28 %
MCH: 28.8 pg (ref 27.0–33.0)
MCHC: 32.6 g/dL (ref 32.0–36.0)
MCV: 88.3 fL (ref 80.0–100.0)
MONOS PCT: 8 %
MPV: 8.7 fL (ref 7.5–12.5)
Monocytes Absolute: 368 cells/uL (ref 200–950)
Neutro Abs: 2760 cells/uL (ref 1500–7800)
Neutrophils Relative %: 60 %
Platelets: 311 10*3/uL (ref 140–400)
RBC: 4.72 MIL/uL (ref 3.80–5.10)
RDW: 14.6 % (ref 11.0–15.0)
WBC: 4.6 10*3/uL (ref 3.8–10.8)

## 2016-10-04 LAB — COMPLETE METABOLIC PANEL WITH GFR
ALT: 18 U/L (ref 6–29)
AST: 16 U/L (ref 10–35)
Albumin: 3.8 g/dL (ref 3.6–5.1)
Alkaline Phosphatase: 74 U/L (ref 33–130)
BILIRUBIN TOTAL: 0.8 mg/dL (ref 0.2–1.2)
BUN: 17 mg/dL (ref 7–25)
CALCIUM: 9.1 mg/dL (ref 8.6–10.4)
CO2: 22 mmol/L (ref 20–31)
CREATININE: 0.77 mg/dL (ref 0.60–0.93)
Chloride: 107 mmol/L (ref 98–110)
GFR, EST AFRICAN AMERICAN: 86 mL/min (ref >=60)
GFR, Est Non African American: 75 mL/min (ref >=60)
Glucose, Bld: 83 mg/dL (ref 70–99)
Potassium: 4.7 mmol/L (ref 3.5–5.3)
SODIUM: 139 mmol/L (ref 135–146)
TOTAL PROTEIN: 6.5 g/dL (ref 6.1–8.1)

## 2016-10-04 LAB — TSH: TSH: 2.06 mIU/L

## 2016-10-04 NOTE — Progress Notes (Signed)
Subjective:    Patient ID: Laura Carter, female    DOB: 12/29/1940, 76 y.o.   MRN: [id number]  HPI Patient is here today for fatigue. Symptoms began when she underwent surgery on her left knee for meniscal tear. Ever since that surgery, she has had chronic pain in the posterior lateral aspect of her left knee. She is seen her orthopedist numerous times and he is recommended no further surgery. He is currently putting her on gabapentin for possible neuropathic pain from the lumbar spine as a contributing factor to her left knee pain. However the patient is specific that the pain is located in the posterior lateral aspect of the left knee only and there is no radiation of pain down her leg or into her feet. She has not seen benefit with gabapentin. She is currently seeing an acupuncturist as well as a chiropractor and also getting regular massages but none of this seems to be helping the pain. As a result she believes she is developing depression. This is been going on now for more than a year. There is also stress in her family. Her daughter has going through a divorce that has lasted 6 years. There are other situations in the family that she does not wish to expand upon this causing stress. All this leaves the patient feeling extremely tired with no motivation. She reports no desire. She denies any suicidal ideation. She denies any true anhedonia. She does get pleasure from life. However she just reports no energy. She feels somewhat apathetic. She has trouble sleeping. She is curious if depression could be causing her fatigue. Past Medical History:  Diagnosis Date  . Asthma   . Diastolic dysfunction   . GERD (gastroesophageal reflux disease)   . Hypertension    Past Surgical History:  Procedure Laterality Date  . CHOLECYSTECTOMY  10/10  . KNEE ARTHROSCOPY WITH MEDIAL MENISECTOMY Left 06/16/2015   Procedure: KNEE ARTHROSCOPY WITH PARTIAL MEDIAL MENISECTOMY;  Surgeon: Carole Civil, MD;   Location: [facility];  Service: Orthopedics;  Laterality: Left;   Current Outpatient Prescriptions on File Prior to Visit  Medication Sig Dispense Refill  . albuterol (PROVENTIL HFA;VENTOLIN HFA) 108 (90 Base) MCG/ACT inhaler Inhale 2 puffs into the lungs every 4 (four) hours as needed for wheezing or shortness of breath. 1 Inhaler 1  . aspirin 81 MG tablet Take 81 mg by mouth daily.    Marland Kitchen gabapentin (NEURONTIN) 100 MG capsule Take 1 capsule (100 mg total) by mouth at bedtime. 30 capsule 0  . Magnesium 500 MG CAPS Take 1 capsule by mouth daily.     . metoprolol succinate (TOPROL-XL) 25 MG 24 hr tablet TAKE ONE TABLET BY MOUTH ONCE DAILY. 90 tablet 0  . TURMERIC PO Take 1 capsule by mouth daily.     No current facility-administered medications on file prior to visit.    No Known Allergies Social History   Social History  . Marital status: Married    Spouse name: N/A  . Number of children: N/A  . Years of education: N/A   Occupational History  . Not on file.   Social History Main Topics  . Smoking status: Former Smoker    Packs/day: 1.00    Years: 10.00    Types: Cigarettes    Quit date: 06/13/1965  . Smokeless tobacco: Never Used  . Alcohol use No  . Drug use: No  . Sexual activity: No   Other Topics Concern  . Not on file  Social History Narrative  . No narrative on file      Review of Systems  All other systems reviewed and are negative.      Objective:   Physical Exam  Constitutional: She appears well-developed and well-nourished.  Neck: Neck supple. No thyromegaly present.  Cardiovascular: Normal rate, regular rhythm and normal heart sounds.   No murmur heard. Pulmonary/Chest: Effort normal and breath sounds normal. No respiratory distress. She has no wheezes. She has no rales.  Musculoskeletal: She exhibits no edema.  Lymphadenopathy:    She has no cervical adenopathy.  Psychiatric: She has a normal mood and affect. Her behavior is normal. Judgment and  thought content normal.  Vitals reviewed.         Assessment & Plan:  Chronic fatigue - Plan: CBC with Differential/Platelet, COMPLETE METABOLIC PANEL WITH GFR, TSH, Vitamin B12  I am not so quick to believe the depression is the cause of her fatigue. She is certainly frustrated by her knee pain and I imagine that the stress in her family in the knee pain can be getting her down. I am not sure that this would qualify as a clinical depression. Therefore I would like to obtain lab work first including a CBC to evaluate for anemia or any bone marrow abnormalities, CMP to check her electrolytes along with her kidney and liver function, a vitamin B12 level, and a TSH. If lab work is normal, I would consider trying her temporarily on an SSRI such as Lexapro 10 mg a day to see if this will help improve her fatigue by treating undiagnosed depression. Await the results of lab work

## 2016-10-05 LAB — VITAMIN B12: VITAMIN B 12: 389 pg/mL (ref 200–1100)

## 2016-10-07 ENCOUNTER — Other Ambulatory Visit: Payer: Self-pay | Admitting: Family Medicine

## 2016-10-07 MED ORDER — ESCITALOPRAM OXALATE 10 MG PO TABS: 10.0000 mg | ORAL_TABLET | Freq: Every day | ORAL | 3 refills | Status: DC

## 2016-10-14 ENCOUNTER — Ambulatory Visit (INDEPENDENT_AMBULATORY_CARE_PROVIDER_SITE_OTHER): Payer: Medicare Other | Admitting: Orthopedic Surgery

## 2016-10-14 ENCOUNTER — Encounter: Payer: Self-pay | Admitting: Orthopedic Surgery

## 2016-10-14 DIAGNOSIS — M48061 Spinal stenosis, lumbar region without neurogenic claudication: Secondary | ICD-10-CM

## 2016-10-14 DIAGNOSIS — M1712 Unilateral primary osteoarthritis, left knee: Secondary | ICD-10-CM | POA: Diagnosis not present

## 2016-10-14 DIAGNOSIS — Z9889 Other specified postprocedural states: Secondary | ICD-10-CM

## 2016-10-14 MED ORDER — GABAPENTIN 100 MG PO CAPS: 100.0000 mg | ORAL_CAPSULE | Freq: Every day | ORAL | 0 refills | Status: DC

## 2016-10-14 NOTE — Progress Notes (Signed)
Follow-up  Encounter Diagnoses  Name Primary?  . Spinal stenosis of lumbar region, unspecified whether neurogenic claudication present Yes  . Primary osteoarthritis of left knee   . S/P left knee arthroscopy     Recommend continue gabapentin 100 mg daily at bedtime  Continue physical therapy  Follow-up 3 months  Chief Complaint  Patient presents with  . Follow-up    LEFT KNEE/BACK    The patient's left knee is feeling much better, it's a combination of warm weather, acupuncture and physical therapy  Review of systems she's not having any back pain at present she still having some lateral leg soft tissue pain and no knee joint pain  She says she is walking better  Examination  Physical Exam  Constitutional: She is oriented to person, place, and time. She appears well-developed and well-nourished.  Neurological: She is alert and oriented to person, place, and time.  Psychiatric: She has a normal mood and affect.  Vitals reviewed.  Left knee has no tenderness or swelling she can fully extend the knee has good quadriceps strength. There is no erythema. She has normal sensation in the left leg skin over the left knee has normal motor exam is normal she has some tenderness around the fibular head and the iliotibial band.  She is ambulatory without a limp and no assisted devices are acquired

## 2016-11-12 ENCOUNTER — Telehealth: Payer: Self-pay | Admitting: Orthopedic Surgery

## 2016-11-12 NOTE — Telephone Encounter (Signed)
Routing to Dr. Harrison to advise 

## 2016-11-12 NOTE — Telephone Encounter (Signed)
Patient calling about the L leg. She states she has been seen by Dr. [name] 3 times relating to the knee and it has been suggested (from her chiropractor and message therapist) that she may have a baker's cyst.  Patient would like to know if an ultrasound or MRI could rule this out.

## 2016-11-12 NOTE — Telephone Encounter (Signed)
NO NEED TO RULE OUT A BAKERS CYST  IN THE ORTHOPEDIC WORLD IT IS OF NO CONSEQUENCE

## 2016-11-20 NOTE — Telephone Encounter (Signed)
Called patient, no answer 

## 2016-12-05 NOTE — Telephone Encounter (Signed)
Patient called back in regard to this question; I relayed to patient per Dr Harrison's response. Still some questions. Routing back to nurse to call back to patient at home [phone]  (states may leave message at this number which is also business#.)

## 2016-12-05 NOTE — Telephone Encounter (Signed)
Returned call to patient and she states that she is still having a lot of problems with the knee and has tried a lot of different treatments to no avail. I moved her appointment up to 12/16/16 with Dr. [name].

## 2016-12-11 DIAGNOSIS — F411 Generalized anxiety disorder: Secondary | ICD-10-CM | POA: Diagnosis not present

## 2016-12-16 ENCOUNTER — Encounter: Payer: Self-pay | Admitting: Orthopedic Surgery

## 2016-12-16 ENCOUNTER — Ambulatory Visit (INDEPENDENT_AMBULATORY_CARE_PROVIDER_SITE_OTHER): Payer: Medicare Other | Admitting: Orthopedic Surgery

## 2016-12-16 DIAGNOSIS — M48061 Spinal stenosis, lumbar region without neurogenic claudication: Secondary | ICD-10-CM

## 2016-12-16 MED ORDER — GABAPENTIN 100 MG PO CAPS: 200.0000 mg | ORAL_CAPSULE | Freq: Every day | ORAL | 0 refills | Status: DC

## 2016-12-16 NOTE — Patient Instructions (Signed)
MRI L SPINE

## 2016-12-16 NOTE — Progress Notes (Signed)
Patient ID: Laura Carter, female   DOB: [birth year], 76 y.o.   MRN: [id number]   FOLLOW UP VISIT    Chief Complaint  Patient presents with  . Follow-up    LUMBAR SPINE    76 year old female with spinal stenosis and osteoarthritis left knee. She came in for earlier than her scheduled appointment because of persistent posterior leg and knee pain and lateral leg and knee pain.  She is on gabapentin 100 mg daily at bedtime with no side effects.  She gets chiropractic manipulation, acupuncture, massage therapy and her symptoms are fairly well controlled but she would like them a laminated      Review of Systems  Constitutional: Negative for chills, fever, malaise/fatigue and weight loss.  Gastrointestinal: Negative.   Genitourinary: Negative.    Ortho Exam Exam she is well-developed well-nourished. Her grooming hygiene are normal. Her gait is normal. She has a high left hip compared to right  This is from lumbar disease. Her leg lengths are otherwise equal  Her lumbar spine today is nontender. She has some mild tenderness in the left buttock. Her knee is nontender. Her knee flexion is good today. Her knee is stable motor exam is excellent she has no peripheral edema and normal sensation in the left leg    A/P  Medical decision-making  Encounter Diagnosis  Name Primary?  . Spinal stenosis of lumbar region, unspecified whether neurogenic claudication present      Meds ordered this encounter  Medications  . gabapentin (NEURONTIN) 100 MG capsule    Sig: Take 2 capsules (200 mg total) by mouth at bedtime.    Dispense:  60 capsule    Refill:  0   Lumbar spine x-ray  FINDINGS: Frontal, lateral, spot lumbosacral lateral, and bilateral oblique views were obtained. There are 5 non-rib-bearing lumbar type vertebral bodies. There is no fracture or spondylolisthesis. There is marked disc space narrowing at L1-2, L2-3, and L5-S1. There is mild disc space narrowing at L4-5. There  is facet osteoarthritic change at L5-S1 bilaterally as well as to a lesser extent at L2-3 and L4-5 bilaterally.   IMPRESSION: Multilevel osteoarthritic change.  No fracture or spondylolisthesis.   We will increase her gabapentin to 200 mg at night and will get an MRI of her spine to inject her for the     Arther Abbott, MD 12/16/2016 5:19 PM

## 2016-12-18 ENCOUNTER — Other Ambulatory Visit: Payer: Self-pay | Admitting: *Deleted

## 2016-12-18 MED ORDER — DIAZEPAM 5 MG PO TABS: ORAL_TABLET | ORAL | 0 refills | Status: DC

## 2016-12-23 ENCOUNTER — Ambulatory Visit (HOSPITAL_COMMUNITY): Payer: Medicare Other

## 2016-12-24 ENCOUNTER — Ambulatory Visit (HOSPITAL_COMMUNITY)
Admission: RE | Admit: 2016-12-24 | Discharge: 2016-12-24 | Disposition: A | Discharge status: Home / Self Care | Payer: Medicare Other | Source: Ambulatory Visit | Attending: Orthopedic Surgery | Admitting: Orthopedic Surgery

## 2016-12-24 DIAGNOSIS — M8938 Hypertrophy of bone, other site: Secondary | ICD-10-CM | POA: Diagnosis not present

## 2016-12-24 DIAGNOSIS — M545 Low back pain: Secondary | ICD-10-CM | POA: Diagnosis not present

## 2016-12-24 DIAGNOSIS — M5136 Other intervertebral disc degeneration, lumbar region: Secondary | ICD-10-CM | POA: Diagnosis not present

## 2016-12-24 DIAGNOSIS — M5137 Other intervertebral disc degeneration, lumbosacral region: Secondary | ICD-10-CM | POA: Insufficient documentation

## 2016-12-24 DIAGNOSIS — M48061 Spinal stenosis, lumbar region without neurogenic claudication: Secondary | ICD-10-CM | POA: Diagnosis not present

## 2016-12-27 DIAGNOSIS — H43813 Vitreous degeneration, bilateral: Secondary | ICD-10-CM | POA: Diagnosis not present

## 2016-12-27 DIAGNOSIS — Z961 Presence of intraocular lens: Secondary | ICD-10-CM | POA: Diagnosis not present

## 2016-12-27 DIAGNOSIS — H524 Presbyopia: Secondary | ICD-10-CM | POA: Diagnosis not present

## 2016-12-27 DIAGNOSIS — H25812 Combined forms of age-related cataract, left eye: Secondary | ICD-10-CM | POA: Diagnosis not present

## 2017-01-03 ENCOUNTER — Ambulatory Visit (INDEPENDENT_AMBULATORY_CARE_PROVIDER_SITE_OTHER): Payer: Medicare Other | Admitting: Orthopedic Surgery

## 2017-01-03 DIAGNOSIS — Z9889 Other specified postprocedural states: Secondary | ICD-10-CM

## 2017-01-03 DIAGNOSIS — M48061 Spinal stenosis, lumbar region without neurogenic claudication: Secondary | ICD-10-CM

## 2017-01-03 DIAGNOSIS — M1712 Unilateral primary osteoarthritis, left knee: Secondary | ICD-10-CM

## 2017-01-03 NOTE — Progress Notes (Signed)
Routine follow-up status post MRI lumbar spine  Patient has 2 problems 1 osteoarthritis left knee  2 spinal stenosis  She continues to have posterior knee pain and lateral knee pain and she will limp significantly after walking such as shopping  The MRI report is as follows Disc desiccation throughout the lumbar spine. Severe disc space narrowing at L1-2, L2-3, and L5-S1.   L1-2: Circumferential disc bulging and mild right greater than left facet hypertrophy result in mild bilateral neural foraminal stenosis without spinal stenosis.   L2-3: Mild disc bulging and mild-to-moderate facet and ligamentum flavum hypertrophy without significant stenosis.   L3-4: Minimal disc bulging, shallow bilateral foraminal disc protrusions, and moderate facet and ligamentum flavum hypertrophy without stenosis.   L4-5: Advanced bilateral facet arthrosis with bulky spurring and ligamentum flavum thickening and minimal disc bulging result in severe spinal stenosis. No significant neural foraminal stenosis.   L5-S1: Circumferential disc bulging, small central disc extrusion with slight caudal migration, and mild facet hypertrophy result in mild bilateral lateral recess stenosis and mild bilateral neural foraminal stenosis without significant spinal stenosis.   IMPRESSION: 1. Severe spinal stenosis at L4-5 due to posterior element hypertrophy. 2. Advanced disc degeneration at L1-2, L2-3, and L5-S1 without high-grade stenosis.  I interpret the MRI on my review as severe spinal stenosis  As I discussed with her she really needs to have a neurosurgical evaluation to make sure she doesn't need any surgery. I don't think her situation will be improved by knee surgery such as replacement as her symptoms do not localize to the compartments of either knee  I am not sure with the neurosurgeons will say but based on the changes at L4-5 mainly severe spinal stenosis that I think she should have evaluated and  she finally agrees  Encounter Diagnoses  Name Primary?  . Spinal stenosis of lumbar region, unspecified whether neurogenic claudication present Yes  . Primary osteoarthritis of left knee   . S/P left knee arthroscopy     She will come back to me after the evaluation and we will continue to treat her knee problem

## 2017-01-06 NOTE — Addendum Note (Signed)
Addended byCandice Camp on: 01/06/2017 01:40 PM   Modules accepted: Orders

## 2017-01-20 ENCOUNTER — Ambulatory Visit: Payer: Medicare Other | Admitting: Orthopedic Surgery

## 2017-01-21 DIAGNOSIS — M546 Pain in thoracic spine: Secondary | ICD-10-CM | POA: Diagnosis not present

## 2017-01-21 DIAGNOSIS — Z6830 Body mass index (BMI) 30.0-30.9, adult: Secondary | ICD-10-CM | POA: Diagnosis not present

## 2017-01-21 DIAGNOSIS — M4316 Spondylolisthesis, lumbar region: Secondary | ICD-10-CM | POA: Diagnosis not present

## 2017-01-21 DIAGNOSIS — M5136 Other intervertebral disc degeneration, lumbar region: Secondary | ICD-10-CM | POA: Diagnosis not present

## 2017-01-21 DIAGNOSIS — I10 Essential (primary) hypertension: Secondary | ICD-10-CM | POA: Diagnosis not present

## 2017-01-21 DIAGNOSIS — M47816 Spondylosis without myelopathy or radiculopathy, lumbar region: Secondary | ICD-10-CM | POA: Diagnosis not present

## 2017-01-21 DIAGNOSIS — M549 Dorsalgia, unspecified: Secondary | ICD-10-CM | POA: Diagnosis not present

## 2017-01-21 DIAGNOSIS — M48061 Spinal stenosis, lumbar region without neurogenic claudication: Secondary | ICD-10-CM | POA: Diagnosis not present

## 2017-01-31 DIAGNOSIS — F411 Generalized anxiety disorder: Secondary | ICD-10-CM | POA: Diagnosis not present

## 2017-02-12 DIAGNOSIS — Z23 Encounter for immunization: Secondary | ICD-10-CM | POA: Diagnosis not present

## 2017-02-12 DIAGNOSIS — D485 Neoplasm of uncertain behavior of skin: Secondary | ICD-10-CM | POA: Diagnosis not present

## 2017-02-12 DIAGNOSIS — L814 Other melanin hyperpigmentation: Secondary | ICD-10-CM | POA: Diagnosis not present

## 2017-02-12 DIAGNOSIS — L988 Other specified disorders of the skin and subcutaneous tissue: Secondary | ICD-10-CM | POA: Diagnosis not present

## 2017-02-12 DIAGNOSIS — L821 Other seborrheic keratosis: Secondary | ICD-10-CM | POA: Diagnosis not present

## 2017-02-12 DIAGNOSIS — D225 Melanocytic nevi of trunk: Secondary | ICD-10-CM | POA: Diagnosis not present

## 2017-02-12 DIAGNOSIS — D1801 Hemangioma of skin and subcutaneous tissue: Secondary | ICD-10-CM | POA: Diagnosis not present

## 2017-02-12 DIAGNOSIS — L570 Actinic keratosis: Secondary | ICD-10-CM | POA: Diagnosis not present

## 2017-02-27 DIAGNOSIS — M1712 Unilateral primary osteoarthritis, left knee: Secondary | ICD-10-CM | POA: Diagnosis not present

## 2017-02-27 DIAGNOSIS — M25562 Pain in left knee: Secondary | ICD-10-CM | POA: Diagnosis not present

## 2017-02-27 DIAGNOSIS — G8929 Other chronic pain: Secondary | ICD-10-CM | POA: Diagnosis not present

## 2017-03-12 ENCOUNTER — Ambulatory Visit (HOSPITAL_COMMUNITY): Payer: Medicare Other | Attending: Orthopedic Surgery | Admitting: Physical Therapy

## 2017-03-12 DIAGNOSIS — R2242 Localized swelling, mass and lump, left lower limb: Secondary | ICD-10-CM | POA: Diagnosis not present

## 2017-03-12 DIAGNOSIS — G8929 Other chronic pain: Secondary | ICD-10-CM | POA: Diagnosis not present

## 2017-03-12 DIAGNOSIS — R262 Difficulty in walking, not elsewhere classified: Secondary | ICD-10-CM | POA: Insufficient documentation

## 2017-03-12 DIAGNOSIS — R2681 Unsteadiness on feet: Secondary | ICD-10-CM | POA: Insufficient documentation

## 2017-03-12 DIAGNOSIS — M25562 Pain in left knee: Secondary | ICD-10-CM | POA: Diagnosis not present

## 2017-03-12 DIAGNOSIS — M25662 Stiffness of left knee, not elsewhere classified: Secondary | ICD-10-CM | POA: Insufficient documentation

## 2017-03-12 NOTE — Patient Instructions (Addendum)
Toe Raise (Sitting)    Raise toes, keeping heels on floor. Repeat ____ times per set. Do ____ sets per session. Do ____ sessions per day.  http://orth.exer.us/46   Copyright  VHI. All rights reserved.  Strengthening: Quadriceps Set    Tighten muscles on top of thighs by pushing knees down into surface. Hold __5__ seconds. Repeat __10__ times per set. Do _1___ sets per session. Do __3__ sessions per day.  http://orth.exer.us/602   Copyright  VHI. All rights reserved.  Self-Mobilization: Heel Slide (Supine)    Slide left heel toward buttocks until a gentle stretch is felt. Hold __5__ seconds. Relax. Repeat _10___ times per set. Do ___1_ sets per session. Do 2____ sessions per day.  http://orth.exer.us/710   Copyright  VHI. All rights reserved.  Functional Quadriceps: Chair Squat    Keeping feet flat on floor, shoulder width apart, squat as low as is comfortable. Use support as necessary. Repeat __10__ times per set. Do ___1_ sets per session. Do __2__ sessions per day.  http://orth.exer.us/736   Copyright  VHI. All rights reserved.

## 2017-03-12 NOTE — Therapy (Signed)
[facility] [address] [place], [place], [address] Phone: [phone]   Fax:  [phone]  Physical Therapy Evaluation  Patient Details  Name: Laura Carter MRN: [id number] Date of Birth: 1941-03-08 Referring Provider: Paralee Cancel   Encounter Date: 03/12/2017      PT End of Session - 03/12/17 1617    Visit Number 1   Number of Visits 8   Date for PT Re-Evaluation 04/11/17   Authorization Type medicare   Authorization - Visit Number 1   Authorization - Number of Visits 8   PT Start Time 9373   PT Stop Time 1610   PT Time Calculation (min) 40 min   Equipment Utilized During Treatment Gait belt   Activity Tolerance Patient tolerated treatment well   Behavior During Therapy [facility] for tasks assessed/performed      Past Medical History:  Diagnosis Date  . Asthma   . Diastolic dysfunction   . GERD (gastroesophageal reflux disease)   . Hypertension     Past Surgical History:  Procedure Laterality Date  . CHOLECYSTECTOMY  10/10  . KNEE ARTHROSCOPY WITH MEDIAL MENISECTOMY Left 06/16/2015   Procedure: KNEE ARTHROSCOPY WITH PARTIAL MEDIAL MENISECTOMY;  Surgeon: Carole Civil, MD;  Location: [facility];  Service: Orthopedics;  Laterality: Left;    There were no vitals filed for this visit.       Subjective Assessment - 03/12/17 1551    Subjective Pt states that she has had pain with her LT knee ever since she had arthroscopic surgery on it on 06/16/15.  She had a cortisone shot in her knee in the past two weeks which has improved her pain significantly.  She limps when she walks and the more she does the more she limps.  She went for a second opinion and was referred back to therapy.    Pertinent History arthroscopic sugery    Limitations Sitting;Standing;Walking   How long can you sit comfortably? as long as she wants    How long can you stand comfortably? 20 minutes    How long can you walk comfortably? 20 mintues    Patient  Stated Goals less pain, to be able to walk longer, to carry items into the house, to be able to go down steps easier,    Currently in Pain? No/denies  stiffness ; when it hurts max pain is at 6/10    Pain Location Knee   Pain Orientation Left   Pain Descriptors / Indicators Aching   Pain Type Chronic pain   Pain Onset More than a month ago   Pain Frequency Intermittent   Aggravating Factors  weightbearing    Pain Relieving Factors rest     Multiple Pain Sites No            [facility] PT Assessment - 03/12/17 0001      Assessment   Medical Diagnosis Lt knee pain   Referring Provider Paralee Cancel    Onset Date/Surgical Date 06/15/16   Prior Therapy PT, chiropractor, acupunture, yoga, massage therapy      Precautions   Precautions None     Restrictions   Weight Bearing Restrictions No     Balance Screen   Has the patient fallen in the past 6 months No   Has the patient had a decrease in activity level because of a fear of falling?  Yes   Is the patient reluctant to leave their home because of a fear of falling?  No     Home Ecologist residence     Prior Function   Level of Independence Independent   Vocation Retired   Leisure yoga, walking      Cognition   Overall Cognitive Status Within Functional Limits for tasks assessed     Observation/Other Assessments   Focus on Therapeutic Outcomes (FOTO)  38     Functional Tests   Functional tests Sit to Stand;Single leg stance     Single Leg Stance   Comments RT:: 10 seconds LT: 4     Sit to Stand   Comments 5 x 14.93      ROM / Strength   AROM / PROM / Strength Strength;AROM     AROM   AROM Assessment Site Knee   Right/Left Knee Left   Left Knee Extension 10   Left Knee Flexion 120     Strength   Strength Assessment Site Hip;Knee;Ankle   Right/Left Hip Left   Left Hip Flexion 5/5   Left Hip Extension 5/5   Left Hip ABduction 4+/5   Right/Left Knee Left   Left Knee Flexion 5/5    Left Knee Extension 5/5   Right/Left Ankle Left   Left Ankle Dorsiflexion 4+/5            Objective measurements completed on examination: See above findings.          [place] Adult PT Treatment/Exercise - 03/12/17 0001      Exercises   Exercises Knee/Hip     Knee/Hip Exercises: Standing   Functional Squat 5 reps     Knee/Hip Exercises: Seated   Other Seated Knee/Hip Exercises Ankle DF x 10      Knee/Hip Exercises: Supine   Quad Sets Left;10 reps   Heel Slides 5 reps                PT Education - 03/12/17 1616    Education provided Yes   Education Details HEP   Person(s) Educated Patient   Methods Explanation;Verbal cues;Handout   Comprehension Verbalized understanding;Returned demonstration          PT Short Term Goals - 03/12/17 1623      PT SHORT TERM GOAL #1   Title Pt to be able to sit without increased Left  knee pain for up to 45 minutes to be able to enjoy a meal at a restaurant.    Time 2   Period Weeks   Status New   Target Date 03/26/17     PT SHORT TERM GOAL #2   Title Pt to be able to stand for 30 mintues without increased Left knee pain to cook a small meal    Time 2   Period Weeks   Status New     PT SHORT TERM GOAL #3   Title Pt to be  able to walk for 30 mintues without increased left knee pain to be able to perform shopping activities.    Time 2   Period Weeks   Status New     PT SHORT TERM GOAL #4   Title Pt pain in her left knee to be no greater than a 4/10 to be able to complete 30 minutes of light housework without sitting down    Time 2   Period Weeks   Status New     PT SHORT TERM GOAL #5   Title Pt to be able to single leg stance for 12 seconds on her left  leg for decreased risks of falls.    Time 2   Period Weeks   Status New           PT Long Term Goals - 03/12/17 1627      PT LONG TERM GOAL #1   Title Pt to be able to sit for 60 minutes without increased left knee pain for traveling.    Time 4    Period Weeks   Status New   Target Date 04/09/17     PT LONG TERM GOAL #2   Title Pt to be able to stand for 60 minutes without increased left knee pain to be  able to cook an larger meal    Time 4   Period Weeks   Status New     PT LONG TERM GOAL #3   Title Pt to be able to walk  for 60 minutes without increased left knee pain to allow pt to get back to her normal exercise routine for weight control.    Time 4   Period Weeks   Status New     PT LONG TERM GOAL #4   Title Pt left knee pain to be no greater than a 1/10 to allow pt to be able to complete an hour and a half of yard/housework.     Time 4   Period Weeks   Status New                Plan - 03/12/17 1618    Clinical Impression Statement Ms. Brodbeck is a 76 yo female who has been having chronic knee pain which was exacerbated in the past few months.  The patient has been referred to skilled physical therapy.  Evaluation demonstrates decreased ROM, increased pain, decreased balance and slight decreased strength .  Ms. Sollenberger will benefit from skilled physical therapy to address these issues and maximize her functional ability.    History and Personal Factors relevant to plan of care: arthroscopic surgery    Clinical Presentation Stable   Clinical Decision Making Low   Rehab Potential Good   PT Frequency 2x / week   PT Duration 4 weeks   PT Treatment/Interventions ADLs/Self Care Home Management;Iontophoresis 4mg /ml Dexamethasone;Ultrasound;Gait training;Functional mobility training;Therapeutic activities;Therapeutic exercise;Balance training;Patient/family education;Manual techniques;Other (comment)  LASER   PT Next Visit Plan Begin slant board and hamstring stretches, SLS, heel toe gait training, heel raises maual and LASER   PT Home Exercise Plan EVAL:  ankle dorsiflexion, quad set, heelslide and functional minisquats    Consulted and Agree with Plan of Care Patient      Patient will benefit from skilled  therapeutic intervention in order to improve the following deficits and impairments:  Decreased activity tolerance, Abnormal gait, Decreased balance, Decreased range of motion, Decreased strength, Difficulty walking, Pain  Visit Diagnosis: Difficulty in walking, not elsewhere classified - Plan: PT plan of care cert/re-cert  Chronic pain of left knee - Plan: PT plan of care cert/re-cert  Stiffness of left knee, not elsewhere classified - Plan: PT plan of care cert/re-cert      G-Codes - 85/46/27 1631    Functional Assessment Tool Used (Outpatient Only) foto    Functional Limitation Changing and maintaining body position   Changing and Maintaining Body Position Current Status (O3500) At least 60 percent but less than 80 percent impaired, limited or restricted   Changing and Maintaining Body Position Goal Status (X3818) At least 40 percent but less than 60 percent impaired, limited or restricted  Problem List Patient Active Problem List   Diagnosis Date Noted  . Medial meniscus, posterior horn derangement   . Arthritis of knee   . Primary osteoarthritis of left knee 04/05/2014  . Diastolic dysfunction     Rayetta Humphrey, [place] CLT [phone] 03/12/2017, 4:34 PM  [facility] [address] [place], [place], [address] Phone: [phone]   Fax:  [phone]  Name: CLAYTON JARMON MRN: [id number] Date of Birth: 1940-12-05

## 2017-03-13 ENCOUNTER — Encounter: Payer: Self-pay | Admitting: Family Medicine

## 2017-03-13 ENCOUNTER — Ambulatory Visit (HOSPITAL_COMMUNITY): Payer: Medicare Other | Admitting: Physical Therapy

## 2017-03-13 ENCOUNTER — Ambulatory Visit (INDEPENDENT_AMBULATORY_CARE_PROVIDER_SITE_OTHER): Payer: Medicare Other | Admitting: Family Medicine

## 2017-03-13 VITALS — BP 120/84 | HR 85 | Ht 65.0 in | Wt 182.0 lb

## 2017-03-13 DIAGNOSIS — G8929 Other chronic pain: Secondary | ICD-10-CM

## 2017-03-13 DIAGNOSIS — K439 Ventral hernia without obstruction or gangrene: Secondary | ICD-10-CM | POA: Diagnosis not present

## 2017-03-13 DIAGNOSIS — R2681 Unsteadiness on feet: Secondary | ICD-10-CM | POA: Diagnosis not present

## 2017-03-13 DIAGNOSIS — R2242 Localized swelling, mass and lump, left lower limb: Secondary | ICD-10-CM | POA: Diagnosis not present

## 2017-03-13 DIAGNOSIS — M25562 Pain in left knee: Secondary | ICD-10-CM

## 2017-03-13 DIAGNOSIS — R262 Difficulty in walking, not elsewhere classified: Secondary | ICD-10-CM | POA: Diagnosis not present

## 2017-03-13 DIAGNOSIS — M25662 Stiffness of left knee, not elsewhere classified: Secondary | ICD-10-CM

## 2017-03-13 NOTE — Patient Instructions (Signed)
Dr. Ria Comment [facility]

## 2017-03-13 NOTE — Progress Notes (Signed)
Patient ID: Laura Carter, female    DOB: [birth year], 76 y.o.   MRN: [id number]  Chief Complaint  Patient presents with  . Establish Care    new patient    Allergies Patient has no known allergies.  Subjective:   Laura Carter is a 76 y.o. female who presents to [facility] today.  HPI Very pleasant 76 year old female who presents today to establish care. She reports that she has some aches and pains but overall feels good. Has chronic knee pain on left side and has seen by orthopedics. Currently now does weekly chiropracter visits, yoga classes, accupuncture, and homeopathic remedies. Reports that she has been seen multiple times by Dr. [name] and Dr. Charm Rings in [place]. Has undergone hyaluronic acid injections into the knee, steroid knee injections, and is currently in physical therapy.  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 Reports that then was seen by spine doctor in [place]. Reports that she was told she does not have symptoms of spinal stenosis, but may develop them, but has evidence of this condition on her x-rays.  Reports that she most recently saw orthopedic doctor 2 weeks in [facility] orthopedic, Dr. Charm Rings. Reports that she was told she was going to have to have left knee replacement. She does not want to have knee replacement surgery at this time and so she was told to do PT. Is doing it at outpatient rehab, doing laser therapy. Reports that her pain is back behind her knee. Dr. Charm Rings gave her a steroid shot in the knee and it really helped. Using ginger, bee pollen, apple cider vinegar, green tea.   Reports that she feels good but one concern she wants to discuss today is that since she had her gallbladder removed multiple years ago she has had an area of her stomach that sticks out. He was told by her previous primary care physician that this was just a normal part of aging. However  patient reports she is concerned about this and would like to have it checked. She reports that the area in her abdomen will get bigger and smaller. She believes that she has a hernia.      Past Medical History:  Diagnosis Date  . Asthma   . Diastolic dysfunction   . GERD (gastroesophageal reflux disease)   . Hypertension     Past Surgical History:  Procedure Laterality Date  . CHOLECYSTECTOMY  10/10  . KNEE ARTHROSCOPY WITH MEDIAL MENISECTOMY Left 06/16/2015   Procedure: KNEE ARTHROSCOPY WITH PARTIAL MEDIAL MENISECTOMY;  Surgeon: Carole Civil, MD;  Location: [facility];  Service: Orthopedics;  Laterality: Left;    Family History  Problem Relation Age of Onset  . Cancer Mother        breast  . Heart disease Father   . Heart disease Sister      Social History   Social History  . Marital status: Married    Spouse name: N/A  . Number of children: N/A  . Years of education: N/A   Social History Main Topics  . Smoking status: Former Smoker    Packs/day: 1.00    Years: 10.00    Types: Cigarettes    Quit date: 06/13/1965  . Smokeless tobacco: Never Used  . Alcohol use No  . Drug use: No  . Sexual activity: No   Other Topics Concern  . None   Social  History Narrative   Grew up in [place], [place]. Married. Has one daughter and one son. Has two grandchildren.    Current Outpatient Prescriptions on File Prior to Visit  Medication Sig Dispense Refill  . aspirin 81 MG tablet Take 81 mg by mouth daily.    Marland Kitchen b complex vitamins capsule Take 1 capsule by mouth daily.    . diazepam (VALIUM) 5 MG tablet TAKE ONE TAB 30 MINUTES PRIOR TO MRI 1 tablet 0  . escitalopram (LEXAPRO) 10 MG tablet Take 1 tablet (10 mg total) by mouth daily. 30 tablet 3  . KRILL OIL PO Take by mouth.    . Magnesium 500 MG CAPS Take 1 capsule by mouth daily.     . metoprolol succinate (TOPROL-XL) 25 MG 24 hr tablet TAKE ONE TABLET BY MOUTH ONCE DAILY. 90 tablet 3   No current facility-administered  medications on file prior to visit.     Review of Systems  Constitutional: Negative for activity change, appetite change and fever.  Eyes: Negative for visual disturbance.  Respiratory: Negative for cough, chest tightness and shortness of breath.   Cardiovascular: Negative for chest pain, palpitations and leg swelling.  Gastrointestinal: Negative for abdominal distention, abdominal pain, anal bleeding, blood in stool, constipation, diarrhea, nausea and vomiting.  Genitourinary: Negative for dysuria, frequency and urgency.  Musculoskeletal: Positive for arthralgias.       Pain in left knee.  Skin: Negative for rash.  Neurological: Negative for dizziness, syncope and light-headedness.  Hematological: Negative for adenopathy.  Psychiatric/Behavioral: Negative for behavioral problems, dysphoric mood, hallucinations, self-injury, sleep disturbance and suicidal ideas. The patient is not nervous/anxious.      Objective:   BP 120/84   Pulse 85   Ht 5\' 5"  (1.651 m)   Wt 182 lb (82.6 kg)   SpO2 97%   BMI 30.29 kg/m   Physical Exam  Constitutional: She is oriented to person, place, and time. She appears well-developed and well-nourished. No distress.  HENT:  Head: Normocephalic and atraumatic.  Eyes: Pupils are equal, round, and reactive to light.  Neck: Normal range of motion. Neck supple. No thyromegaly present.  Cardiovascular: Normal rate, regular rhythm and normal heart sounds.   Pulmonary/Chest: Effort normal and breath sounds normal. No respiratory distress.  Abdominal: Soft. Bowel sounds are normal.  Approximate 4-5 cm reducible ventral hernia at the right epigastric region. Nontender to palpation. No skin discoloration or erythema noted.  Neurological: She is alert and oriented to person, place, and time. No cranial nerve deficit.  Skin: Skin is warm and dry.  Psychiatric: She has a normal mood and affect. Her behavior is normal. Judgment and thought content normal.  Nursing  note and vitals reviewed.    Assessment and Plan   1. Ventral hernia without obstruction or gangrene Patient reports that she does not want to see surgeon for evaluation at this time. She reports that she will consider this and follow back up if she is interested in this. She does not want me to place a referral at this time. She wanted confirmation that this was just not a normal part of aging and was possibly a result of her previous abdominal surgery. Concerning worrisome signs and symptoms of hernia. She was told if they develop to please contact medical help.  Patient's labs that were done in May 2018 were reviewed and discussed with her. She reports that she'll follow back up for physical examination and blood work. She will also consider getting pneumonia and  influenza shots and if she like to have them done she will follow back up for immunizations. She reports that she has other issues that she will discuss at subsequent visit but that is all of her concerns today. Office visit today was greater than 45 minutes. Time was spent counseling and reassuring patient and reviewing her past medical history. She will follow-up with her orthopedic doctor as directed. Return if symptoms worsen or fail to improve, for nurse visit/pneumonia shot. Caren Macadam, MD 03/13/2017

## 2017-03-13 NOTE — Therapy (Signed)
[facility] [address] [place], [place], [address] Phone: [phone]   Fax:  [phone]  Physical Therapy Treatment  Patient Details  Name: Laura Carter MRN: [id number] Date of Birth: January 19, 1941 Referring Provider: Paralee Cancel   Encounter Date: 03/13/2017      PT End of Session - 03/13/17 1308    Visit Number 2   Number of Visits 8   Date for PT Re-Evaluation 04/11/17   Authorization Type medicare   Authorization - Visit Number 2   Authorization - Number of Visits 8   PT Start Time 1122   PT Stop Time 1210   PT Time Calculation (min) 48 min   Equipment Utilized During Treatment Gait belt   Activity Tolerance Patient tolerated treatment well   Behavior During Therapy [facility] for tasks assessed/performed      Past Medical History:  Diagnosis Date  . Asthma   . Diastolic dysfunction   . GERD (gastroesophageal reflux disease)   . Hypertension     Past Surgical History:  Procedure Laterality Date  . CHOLECYSTECTOMY  10/10  . KNEE ARTHROSCOPY WITH MEDIAL MENISECTOMY Left 06/16/2015   Procedure: KNEE ARTHROSCOPY WITH PARTIAL MEDIAL MENISECTOMY;  Surgeon: Carole Civil, MD;  Location: [facility];  Service: Orthopedics;  Laterality: Left;    There were no vitals filed for this visit.      Subjective Assessment - 03/13/17 1127    Subjective PT reports pain in her posterior Lt knee as well as two points above the knee.   Reports no pain at times and gets up to 6/10.   Currently in Pain? No/denies                         [facility] Adult PT Treatment/Exercise - 03/13/17 0001      Knee/Hip Exercises: Stretches   Gastroc Stretch Both;3 reps;30 seconds   Gastroc Stretch Limitations slant board     Knee/Hip Exercises: Standing   Heel Raises 15 reps   Functional Squat 5 reps   SLS bilateral max Lt:8", Rt:10"     Knee/Hip Exercises: Seated   Other Seated Knee/Hip Exercises ankle DF, QS, HS reviewed for HEP     Modalities   Modalities --  LASER 4 sites posterior, 2 sites each anterior 1:45" each       Manual Therapy   Manual Therapy Soft tissue mobilization;Other (comment)   Manual therapy comments completed in adjunt to laser treatment to opposing sites   Soft tissue mobilization to decrease pain and tightness   Other Manual Therapy LASER:  see above parameters.  jt/muscle/bone pain, chronic continuous                PT Education - 03/13/17 1315    Education provided Yes          PT Short Term Goals - 03/12/17 1623      PT SHORT TERM GOAL #1   Title Pt to be able to sit without increased Left  knee pain for up to 45 minutes to be able to enjoy a meal at a restaurant.    Time 2   Period Weeks   Status New   Target Date 03/26/17     PT SHORT TERM GOAL #2   Title Pt to be able to stand for 30 mintues without increased Left knee pain to cook a small meal    Time 2   Period Weeks   Status  New     PT SHORT TERM GOAL #3   Title Pt to be  able to walk for 30 mintues without increased left knee pain to be able to perform shopping activities.    Time 2   Period Weeks   Status New     PT SHORT TERM GOAL #4   Title Pt pain in her left knee to be no greater than a 4/10 to be able to complete 30 minutes of light housework without sitting down    Time 2   Period Weeks   Status New     PT SHORT TERM GOAL #5   Title Pt to be able to single leg stance for 12 seconds on her left leg for decreased risks of falls.    Time 2   Period Weeks   Status New           PT Long Term Goals - 03/12/17 1627      PT LONG TERM GOAL #1   Title Pt to be able to sit for 60 minutes without increased left knee pain for traveling.    Time 4   Period Weeks   Status New   Target Date 04/09/17     PT LONG TERM GOAL #2   Title Pt to be able to stand for 60 minutes without increased left knee pain to be  able to cook an larger meal    Time 4   Period Weeks   Status New     PT LONG TERM  GOAL #3   Title Pt to be able to walk  for 60 minutes without increased left knee pain to allow pt to get back to her normal exercise routine for weight control.    Time 4   Period Weeks   Status New     PT LONG TERM GOAL #4   Title Pt left knee pain to be no greater than a 1/10 to allow pt to be able to complete an hour and a half of yard/housework.     Time 4   Period Weeks   Status New               Plan - 03/13/17 1308    Clinical Impression Statement Pt returns today with reports of compliance with HEP and eagerness to try laser treatment.  REveiwed HEP and goals per initial evalatuion.  Began standing LE strengtheniing/stretching per PT POC all without c/o pain or increased symptoms.  Completed laser to area posterior and two areas anteriorly.  Manual completed to oppositing site during manual.  No immediate results following laser.    Rehab Potential Good   PT Frequency 2x / week   PT Duration 4 weeks   PT Treatment/Interventions ADLs/Self Care Home Management;Iontophoresis 4mg /ml Dexamethasone;Ultrasound;Gait training;Functional mobility training;Therapeutic activities;Therapeutic exercise;Balance training;Patient/family education;Manual techniques;Other (comment)  LASER   PT Next Visit Plan Begin hamstring stretches and work on  heel toe gait training.  Assess results of manual and LASER   PT Home Exercise Plan EVAL:  ankle dorsiflexion, quad set, heelslide and functional minisquats    Consulted and Agree with Plan of Care Patient      Patient will benefit from skilled therapeutic intervention in order to improve the following deficits and impairments:  Decreased activity tolerance, Abnormal gait, Decreased balance, Decreased range of motion, Decreased strength, Difficulty walking, Pain  Visit Diagnosis: Difficulty in walking, not elsewhere classified  Chronic pain of left knee  Stiffness of left knee, not elsewhere  classified       G-Codes - 03/12/17 1631     Functional Assessment Tool Used (Outpatient Only) foto    Functional Limitation Changing and maintaining body position   Changing and Maintaining Body Position Current Status (Z6109) At least 60 percent but less than 80 percent impaired, limited or restricted   Changing and Maintaining Body Position Goal Status (U0454) At least 40 percent but less than 60 percent impaired, limited or restricted      Problem List Patient Active Problem List   Diagnosis Date Noted  . Medial meniscus, posterior horn derangement   . Arthritis of knee   . Primary osteoarthritis of left knee 04/05/2014  . Diastolic dysfunction    Teena Irani, PTA/CLT [phone]  Teena Irani 03/13/2017, 1:15 PM  [facility] [place], [place], [address] Phone: [phone]   Fax:  [phone]  Name: Laura Carter MRN: [id number] Date of Birth: Feb 06, 1941

## 2017-03-17 ENCOUNTER — Ambulatory Visit (HOSPITAL_COMMUNITY): Payer: Medicare Other | Admitting: Physical Therapy

## 2017-03-17 DIAGNOSIS — M25662 Stiffness of left knee, not elsewhere classified: Secondary | ICD-10-CM

## 2017-03-17 DIAGNOSIS — M25562 Pain in left knee: Secondary | ICD-10-CM

## 2017-03-17 DIAGNOSIS — R2681 Unsteadiness on feet: Secondary | ICD-10-CM | POA: Diagnosis not present

## 2017-03-17 DIAGNOSIS — R262 Difficulty in walking, not elsewhere classified: Secondary | ICD-10-CM

## 2017-03-17 DIAGNOSIS — R2242 Localized swelling, mass and lump, left lower limb: Secondary | ICD-10-CM | POA: Diagnosis not present

## 2017-03-17 DIAGNOSIS — G8929 Other chronic pain: Secondary | ICD-10-CM

## 2017-03-17 NOTE — Therapy (Signed)
[facility] [place], [place], [address] Phone: [phone]   Fax:  [phone]  Physical Therapy Treatment  Patient Details  Name: Laura Carter MRN: [id number] Date of Birth: [birth year] Referring Provider: Paralee Cancel   Encounter Date: 03/17/2017      PT End of Session - 03/17/17 1325    Visit Number 3   Number of Visits 8   Date for PT Re-Evaluation 04/11/17   Authorization Type medicare   Authorization - Visit Number 3   Authorization - Number of Visits 8   PT Start Time 3267   PT Stop Time 1355   PT Time Calculation (min) 50 min   Equipment Utilized During Treatment Gait belt   Activity Tolerance Patient tolerated treatment well   Behavior During Therapy [facility] for tasks assessed/performed      Past Medical History:  Diagnosis Date  . Asthma   . Diastolic dysfunction   . GERD (gastroesophageal reflux disease)   . Hypertension     Past Surgical History:  Procedure Laterality Date  . CHOLECYSTECTOMY  10/10  . KNEE ARTHROSCOPY WITH MEDIAL MENISECTOMY Left 06/16/2015   Procedure: KNEE ARTHROSCOPY WITH PARTIAL MEDIAL MENISECTOMY;  Surgeon: Carole Civil, MD;  Location: [facility];  Service: Orthopedics;  Laterality: Left;    There were no vitals filed for this visit.      Subjective Assessment - 03/17/17 1318    Currently in Pain? Yes   Pain Location Knee   Pain Orientation Left   Pain Descriptors / Indicators Aching;Tightness   Pain Type Chronic pain                         [facility] Adult PT Treatment/Exercise - 03/17/17 0001      Knee/Hip Exercises: Stretches   Active Hamstring Stretch Both;2 reps;30 seconds   Active Hamstring Stretch Limitations 12" step   Gastroc Stretch Both;3 reps;30 seconds   Gastroc Stretch Limitations slant board     Knee/Hip Exercises: Standing   Heel Raises 15 reps   Forward Lunges Both;10 reps   Forward Lunges Limitations no UE's onto 4" surface   Functional Squat 10 reps     Knee/Hip Exercises: Supine   Quad Sets Left;10 reps     Manual Therapy   Manual Therapy Soft tissue mobilization;Other (comment)   Manual therapy comments completed in adjunt to laser treatment to opposing sites   Soft tissue mobilization to decrease pain and tightness   Other Manual Therapy LASER:  joint/bone pain/stiffness, chronic continuous, 1:45 at 75Joules medial and lateral knee 3 places at each location                  PT Short Term Goals - 03/12/17 1623      PT SHORT TERM GOAL #1   Title Pt to be able to sit without increased Left  knee pain for up to 45 minutes to be able to enjoy a meal at a restaurant.    Time 2   Period Weeks   Status New   Target Date 03/26/17     PT SHORT TERM GOAL #2   Title Pt to be able to stand for 30 mintues without increased Left knee pain to cook a small meal    Time 2   Period Weeks   Status New     PT SHORT TERM GOAL #3   Title Pt to be  able to walk for 30  mintues without increased left knee pain to be able to perform shopping activities.    Time 2   Period Weeks   Status New     PT SHORT TERM GOAL #4   Title Pt pain in her left knee to be no greater than a 4/10 to be able to complete 30 minutes of light housework without sitting down    Time 2   Period Weeks   Status New     PT SHORT TERM GOAL #5   Title Pt to be able to single leg stance for 12 seconds on her left leg for decreased risks of falls.    Time 2   Period Weeks   Status New           PT Long Term Goals - 03/12/17 1627      PT LONG TERM GOAL #1   Title Pt to be able to sit for 60 minutes without increased left knee pain for traveling.    Time 4   Period Weeks   Status New   Target Date 04/09/17     PT LONG TERM GOAL #2   Title Pt to be able to stand for 60 minutes without increased left knee pain to be  able to cook an larger meal    Time 4   Period Weeks   Status New     PT LONG TERM GOAL #3   Title Pt to  be able to walk  for 60 minutes without increased left knee pain to allow pt to get back to her normal exercise routine for weight control.    Time 4   Period Weeks   Status New     PT LONG TERM GOAL #4   Title Pt left knee pain to be no greater than a 1/10 to allow pt to be able to complete an hour and a half of yard/housework.     Time 4   Period Weeks   Status New               Plan - 03/17/17 1325    Clinical Impression Statement overall good results reported from use of laser.  No longer with posterior knee pain, only in anterior aspect.  Progressed with lunges this session with good control and minimal discomfort.  Continued with manual and laser wtih focus on anterior knee this session.    No pain or additional irritation noted at EOS.   Rehab Potential Good   PT Frequency 2x / week   PT Duration 4 weeks   PT Treatment/Interventions ADLs/Self Care Home Management;Iontophoresis 4mg /ml Dexamethasone;Ultrasound;Gait training;Functional mobility training;Therapeutic activities;Therapeutic exercise;Balance training;Patient/family education;Manual techniques;Other (comment)  LASER   PT Next Visit Plan Continue to progress towards goals. Continue manual and LASER as long as beneficial.   PT Home Exercise Plan EVAL:  ankle dorsiflexion, quad set, heelslide and functional minisquats    Consulted and Agree with Plan of Care Patient      Patient will benefit from skilled therapeutic intervention in order to improve the following deficits and impairments:  Decreased activity tolerance, Abnormal gait, Decreased balance, Decreased range of motion, Decreased strength, Difficulty walking, Pain  Visit Diagnosis: Difficulty in walking, not elsewhere classified  Chronic pain of left knee  Stiffness of left knee, not elsewhere classified     Problem List Patient Active Problem List   Diagnosis Date Noted  . Medial meniscus, posterior horn derangement   . Arthritis of knee   .  Primary osteoarthritis  of left knee 04/05/2014  . Diastolic dysfunction    Teena Irani, PTA/CLT [phone]  Teena Irani 03/17/2017, 2:10 PM  [facility] [place], [place], [address] Phone: [phone]   Fax:  [phone]  Name: Laura Carter MRN: [id number] Date of Birth: Aug 15, 1940

## 2017-03-20 ENCOUNTER — Ambulatory Visit (HOSPITAL_COMMUNITY): Payer: Medicare Other | Admitting: Physical Therapy

## 2017-03-20 DIAGNOSIS — M25562 Pain in left knee: Secondary | ICD-10-CM

## 2017-03-20 DIAGNOSIS — R262 Difficulty in walking, not elsewhere classified: Secondary | ICD-10-CM

## 2017-03-20 DIAGNOSIS — R2681 Unsteadiness on feet: Secondary | ICD-10-CM

## 2017-03-20 DIAGNOSIS — M25662 Stiffness of left knee, not elsewhere classified: Secondary | ICD-10-CM

## 2017-03-20 DIAGNOSIS — R2242 Localized swelling, mass and lump, left lower limb: Secondary | ICD-10-CM | POA: Diagnosis not present

## 2017-03-20 DIAGNOSIS — G8929 Other chronic pain: Secondary | ICD-10-CM

## 2017-03-20 NOTE — Therapy (Signed)
[facility] [address] [place], [place], [address] Phone: [phone]   Fax:  [phone]  Physical Therapy Treatment  Patient Details  Name: Laura Carter MRN: [id number] Date of Birth: June 05, 1940 Referring Provider: Paralee Cancel   Encounter Date: 03/20/2017      PT End of Session - 03/20/17 1420    Visit Number 4   Number of Visits 8   Date for PT Re-Evaluation 04/11/17   Authorization Type medicare   Authorization - Visit Number 4   Authorization - Number of Visits 8   PT Start Time 1350   PT Stop Time 1430   PT Time Calculation (min) 40 min   Equipment Utilized During Treatment Gait belt   Activity Tolerance Patient tolerated treatment well   Behavior During Therapy [facility] for tasks assessed/performed      Past Medical History:  Diagnosis Date  . Asthma   . Diastolic dysfunction   . GERD (gastroesophageal reflux disease)   . Hypertension     Past Surgical History:  Procedure Laterality Date  . CHOLECYSTECTOMY  10/10  . KNEE ARTHROSCOPY WITH MEDIAL MENISECTOMY Left 06/16/2015   Procedure: KNEE ARTHROSCOPY WITH PARTIAL MEDIAL MENISECTOMY;  Surgeon: Carole Civil, MD;  Location: [facility];  Service: Orthopedics;  Laterality: Left;    There were no vitals filed for this visit.      Subjective Assessment - 03/20/17 1400    Subjective Pt states that she was able to get into her attic and clean up due to the back of her knee being pain free.  She is still having pain along the anterior aspect of her knee    Pertinent History arthroscopic sugery    Limitations Sitting;Standing;Walking   How long can you sit comfortably? as long as she wants    How long can you stand comfortably? 20 minutes    How long can you walk comfortably? 20 mintues    Patient Stated Goals less pain, to be able to walk longer, to carry items into the house, to be able to go down steps easier,    Currently in Pain? Yes   Pain Score 3    Pain  Location Knee   Pain Orientation Left   Pain Onset More than a month ago                         [facility] Adult PT Treatment/Exercise - 03/20/17 0001      Knee/Hip Exercises: Stretches   Gastroc Stretch Both;3 reps;30 seconds   Gastroc Stretch Limitations slant board     Knee/Hip Exercises: Standing   Heel Raises 15 reps   Forward Lunges 15 reps   Functional Squat 15 reps   SLS x5   Other Standing Knee Exercises side stepping with green t-band x 10      Knee/Hip Exercises: Supine   Terminal Knee Extension Left;10 reps     Manual Therapy   Manual Therapy Soft tissue mobilization;Other (comment)   Manual therapy comments completed in adjunt to laser treatment to opposing sites   Soft tissue mobilization to decrease pain and tightness   Other Manual Therapy LASER:  joint/bone pain/stiffness, chronic continuous, 1:45 at 75Joules medial and lateral knee 3 places at each location                  PT Short Term Goals - 03/12/17 1623      PT SHORT TERM GOAL #1  Title Pt to be able to sit without increased Left  knee pain for up to 45 minutes to be able to enjoy a meal at a restaurant.    Time 2   Period Weeks   Status New   Target Date 03/26/17     PT SHORT TERM GOAL #2   Title Pt to be able to stand for 30 mintues without increased Left knee pain to cook a small meal    Time 2   Period Weeks   Status New     PT SHORT TERM GOAL #3   Title Pt to be  able to walk for 30 mintues without increased left knee pain to be able to perform shopping activities.    Time 2   Period Weeks   Status New     PT SHORT TERM GOAL #4   Title Pt pain in her left knee to be no greater than a 4/10 to be able to complete 30 minutes of light housework without sitting down    Time 2   Period Weeks   Status New     PT SHORT TERM GOAL #5   Title Pt to be able to single leg stance for 12 seconds on her left leg for decreased risks of falls.    Time 2   Period Weeks    Status New           PT Long Term Goals - 03/12/17 1627      PT LONG TERM GOAL #1   Title Pt to be able to sit for 60 minutes without increased left knee pain for traveling.    Time 4   Period Weeks   Status New   Target Date 04/09/17     PT LONG TERM GOAL #2   Title Pt to be able to stand for 60 minutes without increased left knee pain to be  able to cook an larger meal    Time 4   Period Weeks   Status New     PT LONG TERM GOAL #3   Title Pt to be able to walk  for 60 minutes without increased left knee pain to allow pt to get back to her normal exercise routine for weight control.    Time 4   Period Weeks   Status New     PT LONG TERM GOAL #4   Title Pt left knee pain to be no greater than a 1/10 to allow pt to be able to complete an hour and a half of yard/housework.     Time 4   Period Weeks   Status New               Plan - 03/20/17 1420    Clinical Impression Statement PT continues to have anterior pain only but noted tightness in posterior lateral aspect with manual.   Added terminal extension and hip abduction strengthening to program    Rehab Potential Good   PT Frequency 2x / week   PT Duration 4 weeks   PT Treatment/Interventions ADLs/Self Care Home Management;Iontophoresis 4mg /ml Dexamethasone;Ultrasound;Gait training;Functional mobility training;Therapeutic activities;Therapeutic exercise;Balance training;Patient/family education;Manual techniques;Other (comment)  LASER   PT Next Visit Plan Emphasis manual on posterior medial aspect as well as terminal extension exercise.   PT Home Exercise Plan EVAL:  ankle dorsiflexion, quad set, heelslide and functional minisquats    Consulted and Agree with Plan of Care Patient      Patient will benefit from skilled therapeutic intervention  in order to improve the following deficits and impairments:  Decreased activity tolerance, Abnormal gait, Decreased balance, Decreased range of motion, Decreased strength,  Difficulty walking, Pain  Visit Diagnosis: Difficulty in walking, not elsewhere classified  Chronic pain of left knee  Stiffness of left knee, not elsewhere classified  Unsteadiness on feet     Problem List Patient Active Problem List   Diagnosis Date Noted  . Medial meniscus, posterior horn derangement   . Arthritis of knee   . Primary osteoarthritis of left knee 04/05/2014  . Diastolic dysfunction     Rayetta Humphrey, [place] CLT [phone] 03/20/2017, 2:37 PM  [place], [place], [address] Phone: [phone]   Fax:  [phone]  Name: DELORUS LANGWELL MRN: [id number] Date of Birth: 1941-03-30

## 2017-03-24 ENCOUNTER — Ambulatory Visit (HOSPITAL_COMMUNITY): Payer: Medicare Other | Admitting: Physical Therapy

## 2017-03-24 DIAGNOSIS — M25562 Pain in left knee: Secondary | ICD-10-CM | POA: Diagnosis not present

## 2017-03-24 DIAGNOSIS — R2242 Localized swelling, mass and lump, left lower limb: Secondary | ICD-10-CM | POA: Diagnosis not present

## 2017-03-24 DIAGNOSIS — R2681 Unsteadiness on feet: Secondary | ICD-10-CM | POA: Diagnosis not present

## 2017-03-24 DIAGNOSIS — R262 Difficulty in walking, not elsewhere classified: Secondary | ICD-10-CM

## 2017-03-24 DIAGNOSIS — M25662 Stiffness of left knee, not elsewhere classified: Secondary | ICD-10-CM | POA: Diagnosis not present

## 2017-03-24 DIAGNOSIS — G8929 Other chronic pain: Secondary | ICD-10-CM | POA: Diagnosis not present

## 2017-03-24 NOTE — Therapy (Signed)
[facility] [address] [place], [place], [address] Phone: [phone]   Fax:  [phone]  Physical Therapy Treatment  Patient Details  Name: Laura Carter MRN: [id number] Date of Birth: 1940/12/22 Referring Provider: Paralee Cancel   Encounter Date: 03/24/2017      PT End of Session - 03/24/17 1346    Visit Number 5   Number of Visits 8   Date for PT Re-Evaluation 04/11/17   Authorization Type medicare   Authorization - Visit Number 5   Authorization - Number of Visits 8   PT Start Time 1093   PT Stop Time 1346   PT Time Calculation (min) 44 min   Equipment Utilized During Treatment Gait belt   Activity Tolerance Patient tolerated treatment well   Behavior During Therapy [facility] for tasks assessed/performed      Past Medical History:  Diagnosis Date  . Asthma   . Diastolic dysfunction   . GERD (gastroesophageal reflux disease)   . Hypertension     Past Surgical History:  Procedure Laterality Date  . CHOLECYSTECTOMY  10/10  . KNEE ARTHROSCOPY WITH MEDIAL MENISECTOMY Left 06/16/2015   Procedure: KNEE ARTHROSCOPY WITH PARTIAL MEDIAL MENISECTOMY;  Surgeon: Carole Civil, MD;  Location: [facility];  Service: Orthopedics;  Laterality: Left;    There were no vitals filed for this visit.      Subjective Assessment - 03/24/17 1305    Subjective PT states that she had a good weekend.  She has gone to acupuncture today and is doing well.    Pertinent History arthroscopic sugery    Limitations Sitting;Standing;Walking   How long can you sit comfortably? as long as she wants    How long can you stand comfortably? 20 minutes    How long can you walk comfortably? 20 mintues    Patient Stated Goals less pain, to be able to walk longer, to carry items into the house, to be able to go down steps easier,    Currently in Pain? No/denies   Pain Onset More than a month ago                         [facility] Adult PT  Treatment/Exercise - 03/24/17 0001      Knee/Hip Exercises: Stretches   Gastroc Stretch Both;3 reps;30 seconds   Gastroc Stretch Limitations slant board     Knee/Hip Exercises: Standing   Heel Raises 15 reps   Forward Lunges 15 reps   Side Lunges Both;10 reps   Forward Step Up 10 reps;Step Height: 4"   Functional Squat 15 reps   Other Standing Knee Exercises side stepping with green t-band x 10      Manual Therapy   Manual Therapy Soft tissue mobilization;Other (comment)   Manual therapy comments completed in adjunt to laser treatment to opposing sites   Soft tissue mobilization to decrease pain and tightness   Other Manual Therapy LASER:  joint/bone pain/stiffness, chronic continuous, 1:45 at 75Joules medial and lateral knee 3 places at each location                  PT Short Term Goals - 03/12/17 1623      PT SHORT TERM GOAL #1   Title Pt to be able to sit without increased Left  knee pain for up to 45 minutes to be able to enjoy a meal at a restaurant.    Time 2   Period  Weeks   Status New   Target Date 03/26/17     PT SHORT TERM GOAL #2   Title Pt to be able to stand for 30 mintues without increased Left knee pain to cook a small meal    Time 2   Period Weeks   Status New     PT SHORT TERM GOAL #3   Title Pt to be  able to walk for 30 mintues without increased left knee pain to be able to perform shopping activities.    Time 2   Period Weeks   Status New     PT SHORT TERM GOAL #4   Title Pt pain in her left knee to be no greater than a 4/10 to be able to complete 30 minutes of light housework without sitting down    Time 2   Period Weeks   Status New     PT SHORT TERM GOAL #5   Title Pt to be able to single leg stance for 12 seconds on her left leg for decreased risks of falls.    Time 2   Period Weeks   Status New           PT Long Term Goals - 03/12/17 1627      PT LONG TERM GOAL #1   Title Pt to be able to sit for 60 minutes without  increased left knee pain for traveling.    Time 4   Period Weeks   Status New   Target Date 04/09/17     PT LONG TERM GOAL #2   Title Pt to be able to stand for 60 minutes without increased left knee pain to be  able to cook an larger meal    Time 4   Period Weeks   Status New     PT LONG TERM GOAL #3   Title Pt to be able to walk  for 60 minutes without increased left knee pain to allow pt to get back to her normal exercise routine for weight control.    Time 4   Period Weeks   Status New     PT LONG TERM GOAL #4   Title Pt left knee pain to be no greater than a 1/10 to allow pt to be able to complete an hour and a half of yard/housework.     Time 4   Period Weeks   Status New               Plan - 03/24/17 1346    Clinical Impression Statement Pt ROM at 0 now.  Today pt is painfree which has not occured in a while.  Added step ups and side lunging to pt program.    Rehab Potential Good   PT Frequency 2x / week   PT Duration 4 weeks   PT Treatment/Interventions ADLs/Self Care Home Management;Iontophoresis 4mg /ml Dexamethasone;Ultrasound;Gait training;Functional mobility training;Therapeutic activities;Therapeutic exercise;Balance training;Patient/family education;Manual techniques;Other (comment)  LASER   PT Next Visit Plan Emphasis manual on posterior medial aspect as well as terminal extension exercise.   PT Home Exercise Plan EVAL:  ankle dorsiflexion, quad set, heelslide and functional minisquats    Consulted and Agree with Plan of Care Patient      Patient will benefit from skilled therapeutic intervention in order to improve the following deficits and impairments:  Decreased activity tolerance, Abnormal gait, Decreased balance, Decreased range of motion, Decreased strength, Difficulty walking, Pain  Visit Diagnosis: Difficulty in walking, not elsewhere classified  Chronic pain of left knee  Stiffness of left knee, not elsewhere classified  Unsteadiness on  feet  Localized swelling, mass and lump, left lower limb     Problem List Patient Active Problem List   Diagnosis Date Noted  . Medial meniscus, posterior horn derangement   . Arthritis of knee   . Primary osteoarthritis of left knee 04/05/2014  . Diastolic dysfunction    Rayetta Humphrey, [place] CLT [phone] 03/24/2017, 1:49 PM  [place], [place], [address] Phone: [phone]   Fax:  [phone]  Name: Laura Carter MRN: [id number] Date of Birth: March 17, 1941

## 2017-03-26 ENCOUNTER — Ambulatory Visit (HOSPITAL_COMMUNITY): Payer: Medicare Other | Admitting: Physical Therapy

## 2017-03-26 DIAGNOSIS — R262 Difficulty in walking, not elsewhere classified: Secondary | ICD-10-CM

## 2017-03-26 DIAGNOSIS — M25662 Stiffness of left knee, not elsewhere classified: Secondary | ICD-10-CM

## 2017-03-26 DIAGNOSIS — M25562 Pain in left knee: Secondary | ICD-10-CM

## 2017-03-26 DIAGNOSIS — R2681 Unsteadiness on feet: Secondary | ICD-10-CM | POA: Diagnosis not present

## 2017-03-26 DIAGNOSIS — G8929 Other chronic pain: Secondary | ICD-10-CM | POA: Diagnosis not present

## 2017-03-26 DIAGNOSIS — R2242 Localized swelling, mass and lump, left lower limb: Secondary | ICD-10-CM | POA: Diagnosis not present

## 2017-03-26 NOTE — Therapy (Signed)
[facility] [address] [place], [place], [address] Phone: [phone]   Fax:  [phone]  Physical Therapy Treatment  Patient Details  Name: Laura Carter MRN: [id number] Date of Birth: June 26, 1940 Referring Provider: Paralee Cancel   Encounter Date: 03/26/2017      PT End of Session - 03/26/17 1418    Visit Number 6   Number of Visits 8   Date for PT Re-Evaluation 04/11/17   Authorization Type medicare   Authorization - Visit Number 6   Authorization - Number of Visits 8   PT Start Time 3825   PT Stop Time 1438   PT Time Calculation (min) 44 min   Equipment Utilized During Treatment Gait belt   Activity Tolerance Patient tolerated treatment well   Behavior During Therapy [facility] for tasks assessed/performed      Past Medical History:  Diagnosis Date  . Asthma   . Diastolic dysfunction   . GERD (gastroesophageal reflux disease)   . Hypertension     Past Surgical History:  Procedure Laterality Date  . CHOLECYSTECTOMY  10/10  . KNEE ARTHROSCOPY WITH MEDIAL MENISECTOMY Left 06/16/2015   Procedure: KNEE ARTHROSCOPY WITH PARTIAL MEDIAL MENISECTOMY;  Surgeon: Carole Civil, MD;  Location: [facility];  Service: Orthopedics;  Laterality: Left;    There were no vitals filed for this visit.      Subjective Assessment - 03/26/17 1418    Subjective Pt states she's had 2 days of no pain.  States she also noticed the bulge posteriorly was down considerably.  states she has an appointment with her chiropractor this afternoon.                         [facility] Adult PT Treatment/Exercise - 03/26/17 0001      Knee/Hip Exercises: Stretches   Gastroc Stretch Both;3 reps;30 seconds   Gastroc Stretch Limitations slant board     Knee/Hip Exercises: Standing   Heel Raises 15 reps   Forward Lunges 15 reps   Forward Lunges Limitations no UE's onto 2" surface   Side Lunges Both;10 reps   Side Lunges Limitations --    Functional Squat 15 reps     Manual Therapy   Manual Therapy Soft tissue mobilization;Other (comment)   Manual therapy comments completed in adjunt to laser treatment to opposing sites   Soft tissue mobilization to decrease pain and tightness   Other Manual Therapy LASER:  joint/bone pain/stiffness, chronic continuous, 1:45 at 75Joules medial and lateral knee 3 places at each location                  PT Short Term Goals - 03/12/17 1623      PT SHORT TERM GOAL #1   Title Pt to be able to sit without increased Left  knee pain for up to 45 minutes to be able to enjoy a meal at a restaurant.    Time 2   Period Weeks   Status New   Target Date 03/26/17     PT SHORT TERM GOAL #2   Title Pt to be able to stand for 30 mintues without increased Left knee pain to cook a small meal    Time 2   Period Weeks   Status New     PT SHORT TERM GOAL #3   Title Pt to be  able to walk for 30 mintues without increased left knee pain to be able to perform  shopping activities.    Time 2   Period Weeks   Status New     PT SHORT TERM GOAL #4   Title Pt pain in her left knee to be no greater than a 4/10 to be able to complete 30 minutes of light housework without sitting down    Time 2   Period Weeks   Status New     PT SHORT TERM GOAL #5   Title Pt to be able to single leg stance for 12 seconds on her left leg for decreased risks of falls.    Time 2   Period Weeks   Status New           PT Long Term Goals - 03/12/17 1627      PT LONG TERM GOAL #1   Title Pt to be able to sit for 60 minutes without increased left knee pain for traveling.    Time 4   Period Weeks   Status New   Target Date 04/09/17     PT LONG TERM GOAL #2   Title Pt to be able to stand for 60 minutes without increased left knee pain to be  able to cook an larger meal    Time 4   Period Weeks   Status New     PT LONG TERM GOAL #3   Title Pt to be able to walk  for 60 minutes without increased left knee  pain to allow pt to get back to her normal exercise routine for weight control.    Time 4   Period Weeks   Status New     PT LONG TERM GOAL #4   Title Pt left knee pain to be no greater than a 1/10 to allow pt to be able to complete an hour and a half of yard/housework.     Time 4   Period Weeks   Status New               Plan - 03/26/17 1445    Clinical Impression Statement Pt remains painfree with full extension.  States she only feels some discomfort and "bone on bone" with certain movements and transitions.  good form and control with all exercises today requiring minimal cues.    Rehab Potential Good   PT Frequency 2x / week   PT Duration 4 weeks   PT Treatment/Interventions ADLs/Self Care Home Management;Iontophoresis 4mg /ml Dexamethasone;Ultrasound;Gait training;Functional mobility training;Therapeutic activities;Therapeutic exercise;Balance training;Patient/family education;Manual techniques;Other (comment)  LASER   PT Next Visit Plan Emphasis manual on posterior medial aspect as well as terminal extension exercise.  continue X 2 more sessions.   PT Home Exercise Plan EVAL:  ankle dorsiflexion, quad set, heelslide and functional minisquats    Consulted and Agree with Plan of Care Patient      Patient will benefit from skilled therapeutic intervention in order to improve the following deficits and impairments:  Decreased activity tolerance, Abnormal gait, Decreased balance, Decreased range of motion, Decreased strength, Difficulty walking, Pain  Visit Diagnosis: Difficulty in walking, not elsewhere classified  Chronic pain of left knee  Stiffness of left knee, not elsewhere classified  Unsteadiness on feet     Problem List Patient Active Problem List   Diagnosis Date Noted  . Medial meniscus, posterior horn derangement   . Arthritis of knee   . Primary osteoarthritis of left knee 04/05/2014  . Diastolic dysfunction    Teena Irani,  PTA/CLT [place], Layann Bluett B 03/26/2017, 2:49  PM  [facility] [place], [place], [address] Phone: [phone]   Fax:  [phone]  Name: Laura Carter MRN: [id number] Date of Birth: Nov 28, 1940

## 2017-03-31 ENCOUNTER — Ambulatory Visit (HOSPITAL_COMMUNITY): Payer: Medicare Other | Attending: Orthopedic Surgery | Admitting: Physical Therapy

## 2017-03-31 DIAGNOSIS — M25562 Pain in left knee: Secondary | ICD-10-CM | POA: Diagnosis not present

## 2017-03-31 DIAGNOSIS — M25662 Stiffness of left knee, not elsewhere classified: Secondary | ICD-10-CM

## 2017-03-31 DIAGNOSIS — G8929 Other chronic pain: Secondary | ICD-10-CM | POA: Insufficient documentation

## 2017-03-31 DIAGNOSIS — R2681 Unsteadiness on feet: Secondary | ICD-10-CM | POA: Diagnosis not present

## 2017-03-31 DIAGNOSIS — R262 Difficulty in walking, not elsewhere classified: Secondary | ICD-10-CM | POA: Diagnosis not present

## 2017-03-31 NOTE — Therapy (Signed)
[place], [place], [address] Phone: [phone]   Fax:  [phone]  Physical Therapy Treatment  Patient Details  Name: Laura Carter MRN: [id number] Date of Birth: [birth year] Referring Provider: Paralee Cancel    Encounter Date: 03/31/2017  PT End of Session - 03/31/17 1331    Visit Number  7    Number of Visits  8    Date for PT Re-Evaluation  04/11/17    Authorization Type  medicare    Authorization - Visit Number  7    Authorization - Number of Visits  8    PT Start Time  4627    PT Stop Time  1346    PT Time Calculation (min)  38 min    Equipment Utilized During Treatment  Gait belt    Activity Tolerance  Patient tolerated treatment well    Behavior During Therapy  [facility] for tasks assessed/performed       Past Medical History:  Diagnosis Date  . Asthma   . Diastolic dysfunction   . GERD (gastroesophageal reflux disease)   . Hypertension     Past Surgical History:  Procedure Laterality Date  . CHOLECYSTECTOMY  10/10    There were no vitals filed for this visit.  Subjective Assessment - 03/31/17 1707    Subjective  Pt reports some frustration as her knee is not feeling as well today.  States she was up on it alot over the weekeend.  States she is realizing she is going to have to get the replacement.    Currently in Pain?  Yes    Pain Score  2     Pain Location  Knee    Pain Orientation  Left    Pain Descriptors / Indicators  Aching                      [facility] Adult PT Treatment/Exercise - 03/31/17 0001      Knee/Hip Exercises: Stretches   Gastroc Stretch  Both;3 reps;30 seconds    Gastroc Stretch Limitations  slant board      Knee/Hip Exercises: Standing   Heel Raises  20 reps    Forward Lunges  15 reps    Forward Lunges Limitations  no UE's onto 2" surface    Side Lunges  Both;10 reps    Lateral Step Up  10 reps;Step Height: 4";Left;Hand Hold: 1    Functional Squat  15  reps      Manual Therapy   Manual Therapy  Soft tissue mobilization;Other (comment)    Manual therapy comments  completed in adjunt to laser treatment to opposing sites    Soft tissue mobilization  to decrease pain and tightness    Other Manual Therapy  LASER:  joint/bone pain/stiffness, chronic continuous, 1:45 at 75Joules medial and lateral knee 3 places at each location               PT Short Term Goals - 03/12/17 1623      PT SHORT TERM GOAL #1   Title  Pt to be able to sit without increased Left  knee pain for up to 45 minutes to be able to enjoy a meal at a restaurant.     Time  2    Period  Weeks    Status  New    [employer] Date  03/26/17      PT SHORT TERM GOAL #2   Title  Pt  to be able to stand for 30 mintues without increased Left knee pain to cook a small meal     Time  2    Period  Weeks    Status  New      PT SHORT TERM GOAL #3   Title  Pt to be  able to walk for 30 mintues without increased left knee pain to be able to perform shopping activities.     Time  2    Period  Weeks    Status  New      PT SHORT TERM GOAL #4   Title  Pt pain in her left knee to be no greater than a 4/10 to be able to complete 30 minutes of light housework without sitting down     Time  2    Period  Weeks    Status  New      PT SHORT TERM GOAL #5   Title  Pt to be able to single leg stance for 12 seconds on her left leg for decreased risks of falls.     Time  2    Period  Weeks    Status  New        PT Long Term Goals - 03/12/17 1627      PT LONG TERM GOAL #1   Title  Pt to be able to sit for 60 minutes without increased left knee pain for traveling.     Time  4    Period  Weeks    Status  New    [employer] Date  04/09/17      PT LONG TERM GOAL #2   Title  Pt to be able to stand for 60 minutes without increased left knee pain to be  able to cook an larger meal     Time  4    Period  Weeks    Status  New      PT LONG TERM GOAL #3   Title  Pt to be able to walk  for  60 minutes without increased left knee pain to allow pt to get back to her normal exercise routine for weight control.     Time  4    Period  Weeks    Status  New      PT LONG TERM GOAL #4   Title  Pt left knee pain to be no greater than a 1/10 to allow pt to be able to complete an hour and a half of yard/housework.      Time  4    Period  Weeks    Status  New            Plan - 03/31/17 1331    Clinical Impression Statement  Pt with increased discomfort and overall frustration this session.  Able to continue with most therex with exception of forward step ups which were too painful to complete.  Pt relies on UE a great deal to decrease pressure on LE's.  Pt with questions regarding shoe wear and activities that may increase discomfort.    Rehab Potential  Good    PT Frequency  2x / week    PT Duration  4 weeks    PT Treatment/Interventions  ADLs/Self Care Home Management;Iontophoresis 4mg /ml Dexamethasone;Ultrasound;Gait training;Functional mobility training;Therapeutic activities;Therapeutic exercise;Balance training;Patient/family education;Manual techniques;Other (comment) LASER   LASER   PT Next Visit Plan  Re-evaluate next session.    PT Home Exercise Plan  EVAL:  ankle dorsiflexion,  quad set, heelslide and functional minisquats     Consulted and Agree with Plan of Care  Patient       Patient will benefit from skilled therapeutic intervention in order to improve the following deficits and impairments:  Decreased activity tolerance, Abnormal gait, Decreased balance, Decreased range of motion, Decreased strength, Difficulty walking, Pain  Visit Diagnosis: Difficulty in walking, not elsewhere classified  Chronic pain of left knee  Stiffness of left knee, not elsewhere classified     Problem List Patient Active Problem List   Diagnosis Date Noted  . Medial meniscus, posterior horn derangement   . Arthritis of knee   . Primary osteoarthritis of left knee 04/05/2014  .  Diastolic dysfunction    Teena Irani, PTA/CLT [phone]  Teena Irani 03/31/2017, 5:12 PM  [facility] [address] [place], [place], [address] Phone: [phone]   Fax:  [phone]  Name: KIMBERLA DRISKILL MRN: [id number] Date of Birth: Aug 24, 1940

## 2017-04-03 ENCOUNTER — Encounter (HOSPITAL_COMMUNITY): Payer: Self-pay | Admitting: Physical Therapy

## 2017-04-03 ENCOUNTER — Other Ambulatory Visit: Payer: Self-pay

## 2017-04-03 ENCOUNTER — Ambulatory Visit (HOSPITAL_COMMUNITY): Payer: Medicare Other | Admitting: Physical Therapy

## 2017-04-03 DIAGNOSIS — G8929 Other chronic pain: Secondary | ICD-10-CM

## 2017-04-03 DIAGNOSIS — R2681 Unsteadiness on feet: Secondary | ICD-10-CM | POA: Diagnosis not present

## 2017-04-03 DIAGNOSIS — R262 Difficulty in walking, not elsewhere classified: Secondary | ICD-10-CM

## 2017-04-03 DIAGNOSIS — M25562 Pain in left knee: Secondary | ICD-10-CM

## 2017-04-03 DIAGNOSIS — M25662 Stiffness of left knee, not elsewhere classified: Secondary | ICD-10-CM

## 2017-04-03 NOTE — Therapy (Signed)
[place] [address] [place], [place], [address] Phone: [phone]   Fax:  [phone]  Physical Therapy Treatment  Patient Details  Name: Laura Carter MRN: [id number] Date of Birth: 31-Aug-1940 Referring Provider: Paralee Cancel    Encounter Date: 04/03/2017    Past Medical History:  Diagnosis Date  . Asthma   . Diastolic dysfunction   . GERD (gastroesophageal reflux disease)   . Hypertension     Past Surgical History:  Procedure Laterality Date  . CHOLECYSTECTOMY  10/10    There were no vitals filed for this visit.  Subjective Assessment - 04/03/17 1443    Subjective  Pt states that she feels stiff today.  She is doing alot of walking the last several days. She was working in her attic for three hours yesterday morning and recieved an accupuncture treatment yesterday afternoon.     Pertinent History  arthroscopic sugery     Limitations  Sitting;Standing;Walking    How long can you sit comfortably?  as long as she wants     How long can you stand comfortably?  20 minutes no change     How long can you walk comfortably?  20 mintues no change     Patient Stated Goals  less pain, to be able to walk longer, to carry items into the house, to be able to go down steps easier,     Currently in Pain?  Yes    Pain Score  1  highest pain has been a 4/10     Pain Location  Knee    Pain Orientation  Left;Posterior    Pain Descriptors / Indicators  Aching;Dull;Discomfort    Pain Type  Chronic pain    Pain Onset  More than a month ago    Pain Frequency  Intermittent    Aggravating Factors   long weight bearing     Pain Relieving Factors  exercises     Effect of Pain on Daily Activities  increases         [facility] PT Assessment - 04/03/17 0001      Assessment   Medical Diagnosis  Lt knee pain    Onset Date/Surgical Date  06/15/16    Prior Therapy  PT, chiropractor, acupunture, yoga, massage therapy       Precautions   Precautions  None      Restrictions   Weight Bearing Restrictions  No      Balance Screen   Has the patient fallen in the past 6 months  No    Has the patient had a decrease in activity level because of a fear of falling?   No    Is the patient reluctant to leave their home because of a fear of falling?   No      Home Film/video editor residence      Prior Function   Level of Independence  Independent    Vocation  Retired    Leisure  yoga, walking       Cognition   Overall Cognitive Status  Within Functional Limits for tasks assessed      Observation/Other Assessments   Focus on Therapeutic Outcomes (FOTO)   38      Functional Tests   Functional tests  Sit to Stand;Single leg stance      Single Leg Stance   Comments  RT:  18 was  10 seconds LT: 9 was 4  Sit to Stand   Comments  5 x12.41      was 14.93       AROM   Left Knee Extension  0 was 10    Left Knee Flexion  125 was 120       Strength   Left Hip Flexion  5/5    Left Hip Extension  5/5    Left Hip ABduction  5/5 was 4+/5    Left Knee Flexion  5/5    Left Knee Extension  5/5    Left Ankle Dorsiflexion  5/5 was 4+/5                            PT Short Term Goals - 04/03/17 1516      PT SHORT TERM GOAL #1   Title  Pt to be able to sit without increased Left  knee pain for up to 45 minutes to be able to enjoy a meal at a restaurant.     Time  2    Period  Weeks    Status  Achieved      PT SHORT TERM GOAL #2   Title  Pt to be able to stand for 30 mintues without increased Left knee pain to cook a small meal     Time  2    Period  Weeks    Status  Partially Met      PT SHORT TERM GOAL #3   Title  Pt to be  able to walk for 30 mintues without increased left knee pain to be able to perform shopping activities.     Time  2    Period  Weeks    Status  Partially Met      PT SHORT TERM GOAL #4   Title  Pt pain in her left knee to be no greater than a 4/10  to be able to complete 30 minutes of light housework without sitting down     Time  2    Period  Weeks    Status  Achieved      PT SHORT TERM GOAL #5   Title  Pt to be able to single leg stance for 12 seconds on her left leg for decreased risks of falls.     Time  2    Period  Weeks    Status  Partially Met        PT Long Term Goals - 04/03/17 1516      PT LONG TERM GOAL #1   Title  Pt to be able to sit for 60 minutes without increased left knee pain for traveling.     Time  4    Period  Weeks    Status  Achieved      PT LONG TERM GOAL #2   Title  Pt to be able to stand for 60 minutes without increased left knee pain to be  able to cook an larger meal     Time  4    Period  Weeks    Status  Not Met      PT LONG TERM GOAL #3   Title  Pt to be able to walk  for 60 minutes without increased left knee pain to allow pt to get back to her normal exercise routine for weight control.     Time  4    Period  Weeks    Status  Not Met  PT LONG TERM GOAL #4   Title  Pt left knee pain to be no greater than a 1/10 to allow pt to be able to complete an hour and a half of yard/housework.      Time  4    Period  Weeks    Status  Not Met              Patient will benefit from skilled therapeutic intervention in order to improve the following deficits and impairments:     Visit Diagnosis: Difficulty in walking, not elsewhere classified  Chronic pain of left knee  Stiffness of left knee, not elsewhere classified  Unsteadiness on feet   G-Codes - 24-Apr-2017 1521    Functional Limitation  Changing and maintaining body position    Changing and Maintaining Body Position Goal Status (Y2482)  At least 40 percent but less than 60 percent impaired, limited or restricted    Changing and Maintaining Body Position Discharge Status (N0037)  At least 40 percent but less than 60 percent impaired, limited or restricted       Problem List Patient Active Problem List   Diagnosis  Date Noted  . Medial meniscus, posterior horn derangement   . Arthritis of knee   . Primary osteoarthritis of left knee 04/05/2014  . Diastolic dysfunction     RUSSELL,CINDY 2017/04/24, 3:22 PM  [place], [place], [address] Phone: [phone]   Fax:  [phone]  Name: Laura Carter MRN: [id number] Date of Birth: August 14, 1940

## 2017-04-03 NOTE — Therapy (Signed)
[place], [place], [address] Phone: [phone]   Fax:  [phone]  Physical Therapy Treatment  Patient Details  Name: Laura Carter MRN: [id number] Date of Birth: Jan 13, 1941 Referring Provider: Paralee Cancel    Encounter Date: 04/03/2017  PT End of Session - 04/03/17 1522    Visit Number  8    Number of Visits  8    Date for PT Re-Evaluation  04/11/17    Authorization Type  medicare    Authorization - Visit Number  8    Authorization - Number of Visits  8    PT Start Time  6962    PT Stop Time  1523    PT Time Calculation (min)  47 min    Activity Tolerance  Patient tolerated treatment well    Behavior During Therapy  [facility] for tasks assessed/performed       Past Medical History:  Diagnosis Date  . Asthma   . Diastolic dysfunction   . GERD (gastroesophageal reflux disease)   . Hypertension     Past Surgical History:  Procedure Laterality Date  . CHOLECYSTECTOMY  10/10    There were no vitals filed for this visit.  Subjective Assessment - 04/03/17 1443    Subjective  Pt states that she feels stiff today.  She is doing alot of walking the last several days. She was working in her attic for three hours yesterday morning and recieved an accupuncture treatment yesterday afternoon.     Pertinent History  arthroscopic sugery     Limitations  Sitting;Standing;Walking    How long can you sit comfortably?  as long as she wants     How long can you stand comfortably?  20 minutes no change     How long can you walk comfortably?  20 mintues no change     Patient Stated Goals  less pain, to be able to walk longer, to carry items into the house, to be able to go down steps easier,     Currently in Pain?  Yes    Pain Score  1  highest pain has been a 4/10     Pain Location  Knee    Pain Orientation  Left;Posterior    Pain Descriptors / Indicators  Aching;Dull;Discomfort    Pain Type  Chronic pain    Pain Onset   More than a month ago    Pain Frequency  Intermittent    Aggravating Factors   long weight bearing     Pain Relieving Factors  exercises     Effect of Pain on Daily Activities  increases         [facility] PT Assessment - 04/03/17 0001      Assessment   Medical Diagnosis  Lt knee pain    Onset Date/Surgical Date  06/15/16    Prior Therapy  PT, chiropractor, acupunture, yoga, massage therapy       Precautions   Precautions  None      Restrictions   Weight Bearing Restrictions  No      Balance Screen   Has the patient fallen in the past 6 months  No    Has the patient had a decrease in activity level because of a fear of falling?   No    Is the patient reluctant to leave their home because of a fear of falling?   No      Home Environment   Living Environment  Private residence      Prior Function   Level of Independence  Independent    Vocation  Retired    Leisure  yoga, walking       Cognition   Overall Cognitive Status  Within Functional Limits for tasks assessed      Observation/Other Assessments   Focus on Therapeutic Outcomes (FOTO)   38      Functional Tests   Functional tests  Sit to Stand;Single leg stance      Single Leg Stance   Comments  RT:  18 was  10 seconds LT: 9 was 4      Sit to Stand   Comments  5 x12.41      was 14.93       AROM   Left Knee Extension  0 was 10    Left Knee Flexion  125 was 120       Strength   Left Hip Flexion  5/5    Left Hip Extension  5/5    Left Hip ABduction  5/5 was 4+/5    Left Knee Flexion  5/5    Left Knee Extension  5/5    Left Ankle Dorsiflexion  5/5 was 4+/5                  [facility] Adult PT Treatment/Exercise - 04/03/17 0001      Manual Therapy   Manual Therapy  Soft tissue mobilization;Other (comment)    Manual therapy comments  completed in adjunt to laser treatment to opposing sites    Soft tissue mobilization  to decrease pain and tightness    Other Manual Therapy  LASER:  joint/bone  pain/stiffness, chronic continuous, 1:45 at 75Joules medial and lateral knee 3 places at each location               PT Short Term Goals - 04/03/17 1516      PT SHORT TERM GOAL #1   Title  Pt to be able to sit without increased Left  knee pain for up to 45 minutes to be able to enjoy a meal at a restaurant.     Time  2    Period  Weeks    Status  Achieved      PT SHORT TERM GOAL #2   Title  Pt to be able to stand for 30 mintues without increased Left knee pain to cook a small meal     Time  2    Period  Weeks    Status  Partially Met      PT SHORT TERM GOAL #3   Title  Pt to be  able to walk for 30 mintues without increased left knee pain to be able to perform shopping activities.     Time  2    Period  Weeks    Status  Partially Met      PT SHORT TERM GOAL #4   Title  Pt pain in her left knee to be no greater than a 4/10 to be able to complete 30 minutes of light housework without sitting down     Time  2    Period  Weeks    Status  Achieved      PT SHORT TERM GOAL #5   Title  Pt to be able to single leg stance for 12 seconds on her left leg for decreased risks of falls.     Time  2    Period  Weeks  Status  Partially Met        PT Long Term Goals - 04-13-17 1516      PT LONG TERM GOAL #1   Title  Pt to be able to sit for 60 minutes without increased left knee pain for traveling.     Time  4    Period  Weeks    Status  Achieved      PT LONG TERM GOAL #2   Title  Pt to be able to stand for 60 minutes without increased left knee pain to be  able to cook an larger meal     Time  4    Period  Weeks    Status  Not Met      PT LONG TERM GOAL #3   Title  Pt to be able to walk  for 60 minutes without increased left knee pain to allow pt to get back to her normal exercise routine for weight control.     Time  4    Period  Weeks    Status  Not Met      PT LONG TERM GOAL #4   Title  Pt left knee pain to be no greater than a 1/10 to allow pt to be able to  complete an hour and a half of yard/housework.      Time  4    Period  Weeks    Status  Not Met            Plan - Apr 13, 2017 1523    Clinical Impression Statement  Pt reassessed; she has improved in all aspects but has continued to have LT knee pain especially in the posterior lateral aspect.  Strength and ROM are normal with improved but decreased balance.  Pt urged to continue continue balance and strengthening exercises on her own at home.     Rehab Potential  Good    PT Frequency  2x / week    PT Duration  4 weeks    PT Treatment/Interventions  ADLs/Self Care Home Management;Iontophoresis 35m/ml Dexamethasone;Ultrasound;Gait training;Functional mobility training;Therapeutic activities;Therapeutic exercise;Balance training;Patient/family education;Manual techniques;Other (comment) LASER    PT Next Visit Plan  Discharge to [facility]     PT Home Exercise Plan  EVAL:  ankle dorsiflexion, quad set, heelslide and functional minisquats     Consulted and Agree with Plan of Care  Patient       Patient will benefit from skilled therapeutic intervention in order to improve the following deficits and impairments:  Decreased activity tolerance, Abnormal gait, Decreased balance, Decreased range of motion, Decreased strength, Difficulty walking, Pain  Visit Diagnosis: Difficulty in walking, not elsewhere classified  Chronic pain of left knee  Stiffness of left knee, not elsewhere classified  Unsteadiness on feet   G-Codes - [id number]    Functional Limitation  Changing and maintaining body position    Changing and Maintaining Body Position Goal Status ((E0814  At least 40 percent but less than 60 percent impaired, limited or restricted    Changing and Maintaining Body Position Discharge Status ((G8185  At least 40 percent but less than 60 percent impaired, limited or restricted       Problem List Patient Active Problem List   Diagnosis Date Noted  . Medial meniscus, posterior horn  derangement   . Arthritis of knee   . Primary osteoarthritis of left knee 04/05/2014  . Diastolic dysfunction     CRayetta Humphrey PT CLT [phone]/18/18 3:27 PM  [facility]  [place], [place], [address] Phone: [phone]   Fax:  [phone]  Name: Laura Carter MRN: [id number] Date of Birth: 11/20/1940  PHYSICAL THERAPY DISCHARGE SUMMARY  Visits from Start of Care: 8  Current functional level related to goals / functional outcomes: See above   Remaining deficits: See above   Education / Equipment: [facility]  Plan: Patient agrees to discharge.  Patient goals were partially met. Patient is being discharged due to lack of progress.  ?????        Rayetta Humphrey, [place] CLT [phone]

## 2017-04-07 ENCOUNTER — Encounter (HOSPITAL_COMMUNITY): Payer: Medicare Other | Admitting: Physical Therapy

## 2017-04-09 ENCOUNTER — Encounter (HOSPITAL_COMMUNITY): Payer: Medicare Other | Admitting: Physical Therapy

## 2017-04-11 DIAGNOSIS — M1712 Unilateral primary osteoarthritis, left knee: Secondary | ICD-10-CM | POA: Diagnosis not present

## 2017-04-11 DIAGNOSIS — M25562 Pain in left knee: Secondary | ICD-10-CM | POA: Diagnosis not present

## 2017-04-11 DIAGNOSIS — G8929 Other chronic pain: Secondary | ICD-10-CM | POA: Diagnosis not present

## 2017-05-22 ENCOUNTER — Telehealth: Payer: Self-pay | Admitting: Orthopedic Surgery

## 2017-05-22 NOTE — Telephone Encounter (Signed)
Laura Carter called this afternoon stating she wanted to get a follow up appointment with Dr. [name] to discuss knee replacement.  She then proceeds to tell me that she has seen Dr. Alvan Dame for a 2nd opinion regarding this knee and that she has also had an MRI.  I told her that we would need those notes from Dr. Alvan Dame and  the MRI report and disc for Dr. [name] to review.  She understands that she must get the notes and MRI to [place] ASAP for Dr. [name] to review.  She wanted to know when she might could get an appointment here.  I told her pending approval from Dr. [name], we may could get her in sometime the week of the 14 but this depends on when it gets reviewed.  She understood and will get information to [place].

## 2017-05-30 ENCOUNTER — Emergency Department (HOSPITAL_COMMUNITY)
Admission: EM | Admit: 2017-05-30 | Discharge: 2017-05-30 | Disposition: A | Discharge status: Home / Self Care | Payer: No Typology Code available for payment source | Attending: Emergency Medicine | Admitting: Emergency Medicine

## 2017-05-30 ENCOUNTER — Encounter (HOSPITAL_COMMUNITY): Payer: Self-pay | Admitting: Emergency Medicine

## 2017-05-30 ENCOUNTER — Other Ambulatory Visit: Payer: Self-pay

## 2017-05-30 ENCOUNTER — Emergency Department (HOSPITAL_COMMUNITY): Payer: No Typology Code available for payment source

## 2017-05-30 DIAGNOSIS — Y9389 Activity, other specified: Secondary | ICD-10-CM | POA: Diagnosis not present

## 2017-05-30 DIAGNOSIS — S0990XA Unspecified injury of head, initial encounter: Secondary | ICD-10-CM | POA: Diagnosis not present

## 2017-05-30 DIAGNOSIS — I10 Essential (primary) hypertension: Secondary | ICD-10-CM | POA: Insufficient documentation

## 2017-05-30 DIAGNOSIS — R51 Headache: Secondary | ICD-10-CM | POA: Insufficient documentation

## 2017-05-30 DIAGNOSIS — Z87891 Personal history of nicotine dependence: Secondary | ICD-10-CM | POA: Diagnosis not present

## 2017-05-30 DIAGNOSIS — H539 Unspecified visual disturbance: Secondary | ICD-10-CM | POA: Diagnosis not present

## 2017-05-30 DIAGNOSIS — H538 Other visual disturbances: Secondary | ICD-10-CM | POA: Insufficient documentation

## 2017-05-30 DIAGNOSIS — J45909 Unspecified asthma, uncomplicated: Secondary | ICD-10-CM | POA: Diagnosis not present

## 2017-05-30 DIAGNOSIS — Y998 Other external cause status: Secondary | ICD-10-CM | POA: Diagnosis not present

## 2017-05-30 DIAGNOSIS — Z7982 Long term (current) use of aspirin: Secondary | ICD-10-CM | POA: Diagnosis not present

## 2017-05-30 DIAGNOSIS — R42 Dizziness and giddiness: Secondary | ICD-10-CM | POA: Diagnosis not present

## 2017-05-30 DIAGNOSIS — Y92410 Unspecified street and highway as the place of occurrence of the external cause: Secondary | ICD-10-CM | POA: Diagnosis not present

## 2017-05-30 NOTE — Discharge Instructions (Signed)
Your CT scan is negative tonight for acute injury (no trauma or bleeding noted in your brain).  You do have a small brain nodule which is unchanged from a previous CT scan so is reassuring, however having an mri at some point as an outpatient is recommended to better define the nature of this finding. Please discuss this with Dr Mannie Stabile at your followup appointment with her.  Refer to the head injury instructions below.
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
## 2017-06-02 DIAGNOSIS — M25562 Pain in left knee: Secondary | ICD-10-CM | POA: Diagnosis not present

## 2017-06-10 ENCOUNTER — Ambulatory Visit (INDEPENDENT_AMBULATORY_CARE_PROVIDER_SITE_OTHER): Payer: Medicare Other | Admitting: Family Medicine

## 2017-06-10 ENCOUNTER — Other Ambulatory Visit: Payer: Self-pay

## 2017-06-10 ENCOUNTER — Encounter: Payer: Self-pay | Admitting: Family Medicine

## 2017-06-10 VITALS — BP 146/84 | HR 76 | Temp 98.6°F | Resp 16 | Ht 65.0 in | Wt 182.8 lb

## 2017-06-10 DIAGNOSIS — I10 Essential (primary) hypertension: Secondary | ICD-10-CM

## 2017-06-10 DIAGNOSIS — E538 Deficiency of other specified B group vitamins: Secondary | ICD-10-CM | POA: Diagnosis not present

## 2017-06-10 DIAGNOSIS — R358 Other polyuria: Secondary | ICD-10-CM | POA: Diagnosis not present

## 2017-06-10 DIAGNOSIS — Z1231 Encounter for screening mammogram for malignant neoplasm of breast: Secondary | ICD-10-CM

## 2017-06-10 DIAGNOSIS — Z780 Asymptomatic menopausal state: Secondary | ICD-10-CM

## 2017-06-10 DIAGNOSIS — R739 Hyperglycemia, unspecified: Secondary | ICD-10-CM

## 2017-06-10 DIAGNOSIS — Z0000 Encounter for general adult medical examination without abnormal findings: Secondary | ICD-10-CM | POA: Diagnosis not present

## 2017-06-10 DIAGNOSIS — R5383 Other fatigue: Secondary | ICD-10-CM

## 2017-06-10 DIAGNOSIS — R3589 Other polyuria: Secondary | ICD-10-CM

## 2017-06-10 DIAGNOSIS — Z01818 Encounter for other preprocedural examination: Secondary | ICD-10-CM | POA: Diagnosis not present

## 2017-06-10 DIAGNOSIS — Z23 Encounter for immunization: Secondary | ICD-10-CM

## 2017-06-10 DIAGNOSIS — Z1239 Encounter for other screening for malignant neoplasm of breast: Secondary | ICD-10-CM

## 2017-06-10 NOTE — Patient Instructions (Signed)
Omron BP cuff Check readings and call me with results in a few days Bring your cuff to the office visit
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
## 2017-06-11 ENCOUNTER — Encounter: Payer: Self-pay | Admitting: Family Medicine

## 2017-06-11 LAB — LIPID PANEL
Cholesterol: 191 mg/dL (ref <200)
HDL: 44 mg/dL — AB (ref >50)
LDL CHOLESTEROL (CALC): 112 mg/dL — AB
NON-HDL CHOLESTEROL (CALC): 147 mg/dL — AB (ref <130)
Total CHOL/HDL Ratio: 4.3 (calc) (ref <5.0)
Triglycerides: 232 mg/dL — ABNORMAL HIGH (ref <150)

## 2017-06-11 LAB — CBC WITH DIFFERENTIAL/PLATELET
BASOS PCT: 0.8 %
Basophils Absolute: 38 cells/uL (ref 0–200)
EOS PCT: 3.2 %
Eosinophils Absolute: 154 cells/uL (ref 15–500)
HEMATOCRIT: 40.8 % (ref 35.0–45.0)
HEMOGLOBIN: 13.7 g/dL (ref 11.7–15.5)
LYMPHS ABS: 1171 {cells}/uL (ref 850–3900)
MCH: 28.4 pg (ref 27.0–33.0)
MCHC: 33.6 g/dL (ref 32.0–36.0)
MCV: 84.5 fL (ref 80.0–100.0)
MPV: 9.5 fL (ref 7.5–12.5)
Monocytes Relative: 7.8 %
NEUTROS ABS: 3062 {cells}/uL (ref 1500–7800)
NEUTROS PCT: 63.8 %
Platelets: 329 10*3/uL (ref 140–400)
RBC: 4.83 10*6/uL (ref 3.80–5.10)
RDW: 13.2 % (ref 11.0–15.0)
Total Lymphocyte: 24.4 %
WBC: 4.8 10*3/uL (ref 3.8–10.8)
WBCMIX: 374 {cells}/uL (ref 200–950)

## 2017-06-11 LAB — HEPATIC FUNCTION PANEL
AG RATIO: 1.5 (calc) (ref 1.0–2.5)
ALKALINE PHOSPHATASE (APISO): 82 U/L (ref 33–130)
ALT: 21 U/L (ref 6–29)
AST: 14 U/L (ref 10–35)
Albumin: 4 g/dL (ref 3.6–5.1)
BILIRUBIN INDIRECT: 0.5 mg/dL (ref 0.2–1.2)
BILIRUBIN TOTAL: 0.6 mg/dL (ref 0.2–1.2)
Bilirubin, Direct: 0.1 mg/dL (ref 0.0–0.2)
Globulin: 2.7 g/dL (calc) (ref 1.9–3.7)
TOTAL PROTEIN: 6.7 g/dL (ref 6.1–8.1)

## 2017-06-11 LAB — URINE CULTURE
MICRO NUMBER: 90060161
SPECIMEN QUALITY:: ADEQUATE

## 2017-06-11 LAB — HEMOGLOBIN A1C
EAG (MMOL/L): 7 (calc)
Hgb A1c MFr Bld: 6 % of total Hgb — ABNORMAL HIGH (ref <5.7)
MEAN PLASMA GLUCOSE: 126 (calc)

## 2017-06-11 LAB — URINALYSIS, MICROSCOPIC ONLY
BACTERIA UA: NONE SEEN /HPF
HYALINE CAST: NONE SEEN /LPF
RBC / HPF: NONE SEEN /HPF (ref 0–2)
SQUAMOUS EPITHELIAL / LPF: NONE SEEN /HPF (ref <=5)
WBC, UA: NONE SEEN /HPF (ref 0–5)

## 2017-06-11 LAB — BASIC METABOLIC PANEL
BUN: 21 mg/dL (ref 7–25)
CALCIUM: 9.8 mg/dL (ref 8.6–10.4)
CHLORIDE: 104 mmol/L (ref 98–110)
CO2: 30 mmol/L (ref 20–32)
Creat: 0.91 mg/dL (ref 0.60–0.93)
Glucose, Bld: 98 mg/dL (ref 65–139)
Potassium: 4.6 mmol/L (ref 3.5–5.3)
SODIUM: 140 mmol/L (ref 135–146)

## 2017-06-11 LAB — TSH: TSH: 1.86 mIU/L (ref 0.40–4.50)

## 2017-06-11 LAB — VITAMIN B12: Vitamin B-12: 652 pg/mL (ref 200–1100)

## 2017-06-12 ENCOUNTER — Telehealth: Payer: Self-pay | Admitting: Family Medicine

## 2017-06-12 MED ORDER — AMOXICILLIN 500 MG PO CAPS: 500.0000 mg | ORAL_CAPSULE | Freq: Two times a day (BID) | ORAL | 0 refills | Status: DC

## 2017-06-12 NOTE — Telephone Encounter (Signed)
Please call patient and advised that her urine did grow out Streptococcus bacteria.  We will treat her with an antibiotic at this time due to the fact that she was having increased urinary frequency and she has upcoming surgery.  She will need to complete all of the antibiotic.  She will take amoxicillin 500 mg 1 p.o. twice daily times 7 days.  In addition we should repeat her urine culture prior to her surgery.  I would also like for her to follow-up for blood pressure check in the next several weeks to make sure her blood pressure is controlled before surgery.  We can complete the urine testing at that time.  Advise her that her B12, thyroid, electrolytes and kidney function were within normal limits.  Her labs do indicate that she is prediabetic.  Her cholesterol is slightly elevated but we can discuss this at her follow-up visit.  I will let her know once I have received the chest x-ray results.

## 2017-06-12 NOTE — Telephone Encounter (Signed)
Called patient regarding message below. No answer, unable to leave message. Voice mail not set up 

## 2017-06-13 NOTE — Telephone Encounter (Signed)
Please try to call patient again and call other numbers in her chart. Gwen Her. Mannie Stabile, MD

## 2017-06-13 NOTE — Telephone Encounter (Signed)
Called patient regarding message below. No answer, unable to leave message. Tried home number, work number and husband's number.

## 2017-06-13 NOTE — Telephone Encounter (Signed)
Called patient regarding message below. No answer, unable to leave message.  

## 2017-06-16 ENCOUNTER — Ambulatory Visit (HOSPITAL_COMMUNITY)
Admission: RE | Admit: 2017-06-16 | Discharge: 2017-06-16 | Disposition: A | Discharge status: Home / Self Care | Payer: Medicare Other | Source: Ambulatory Visit | Attending: Family Medicine | Admitting: Family Medicine

## 2017-06-16 ENCOUNTER — Telehealth: Payer: Self-pay | Admitting: Family Medicine

## 2017-06-16 DIAGNOSIS — M25562 Pain in left knee: Secondary | ICD-10-CM | POA: Diagnosis not present

## 2017-06-16 DIAGNOSIS — K449 Diaphragmatic hernia without obstruction or gangrene: Secondary | ICD-10-CM | POA: Diagnosis not present

## 2017-06-16 DIAGNOSIS — R0602 Shortness of breath: Secondary | ICD-10-CM | POA: Insufficient documentation

## 2017-06-16 DIAGNOSIS — K219 Gastro-esophageal reflux disease without esophagitis: Secondary | ICD-10-CM | POA: Diagnosis present

## 2017-06-16 DIAGNOSIS — Z01818 Encounter for other preprocedural examination: Secondary | ICD-10-CM

## 2017-06-16 NOTE — Telephone Encounter (Signed)
[facility] calling to get surgical clearance papers for patient. I am still trying to reach patient regarding previous note.

## 2017-06-16 NOTE — Telephone Encounter (Signed)
Claiborne Billings with [facility] is calling --she needs the medical Clearance she sent on 1-8 & 1-16. ASAP

## 2017-06-16 NOTE — H&P (Signed)
PREOPERATIVE H&P Patient ID: Laura Carter MRN: [id number] DOB/AGE: 1940-07-29 77 y.o.  Chief Complaint: OA LEFT KNEE  Planned Procedure Date: 07/08/2017 Medical and Cardiac Clearance by Dr. Mannie Stabile  HPI: Laura Carter is a 77 y.o. female who presents for evaluation of OA LEFT KNEE. The patient has a history of pain and functional disability in the left knee due to arthritis and has failed non-surgical conservative treatments for greater than 12 weeks to include NSAID's and/or analgesics, corticosteriod injections, supervised PT with diminished ADL's post treatment and activity modification.  Onset of symptoms was gradual, starting 2 years ago with gradually worsening course since that time. The patient noted prior procedures on the knee to include arthroscopy and medial menisectomy on the left knee.  Patient currently rates pain at 7 out of 10 with activity. Patient has night pain, worsening of pain with activity and weight bearing and pain that interferes with activities of daily living.  Patient has evidence of severe anterior medial osteoarthritis with preserved lateral space and adequate medial opening on stress view.  There is no active infection.  Past Medical History:  Diagnosis Date  . Asthma   . Diastolic dysfunction   . GERD (gastroesophageal reflux disease)   . Hypertension    Past Surgical History:  Procedure Laterality Date  . CHOLECYSTECTOMY  10/10  . KNEE ARTHROSCOPY WITH MEDIAL MENISECTOMY Left 06/16/2015   Procedure: KNEE ARTHROSCOPY WITH PARTIAL MEDIAL MENISECTOMY;  Surgeon: Carole Civil, MD;  Location: [facility];  Service: Orthopedics;  Laterality: Left;   No Known Allergies Prior to Admission medications   Medication Sig Start Date End Date Taking? Authorizing Provider  aspirin EC 81 MG tablet Take 81 mg by mouth at bedtime.   Yes [provider]  Magnesium 500 MG CAPS Take 500 mg by mouth at bedtime.    Yes [provider]  Melatonin 3 MG TABS Take  3 mg by mouth at bedtime as needed (for sleep.).   Yes [provider]  metoprolol succinate (TOPROL-XL) 25 MG 24 hr tablet TAKE ONE TABLET BY MOUTH ONCE DAILY. 10/04/16  Yes Susy Frizzle, MD  omeprazole (PRILOSEC OTC) 20 MG tablet Take 20 mg by mouth at bedtime.   Yes [provider]  amoxicillin (AMOXIL) 500 MG capsule Take 1 capsule (500 mg total) by mouth 2 (two) times daily. Patient not taking: Reported on 06/13/2017 06/12/17   Caren Macadam, MD  Cholecalciferol (VITAMIN D3 PO) Take 1 tablet by mouth daily.    [provider]  Coenzyme Q10 (COQ10 PO) Take 1 capsule by mouth daily.    [provider]  Cyanocobalamin (VITAMIN B-12 PO) Take 1 tablet by mouth daily.    [provider]  KRILL OIL PO Take 1 capsule by mouth daily.    [provider]  Multiple Vitamins-Minerals (ZINC PO) Take 1 tablet by mouth daily as needed (for immune health/onset of cold).    [provider]  Pyridoxine HCl (VITAMIN B-6 PO) Take 1 tablet by mouth daily.    [provider]   Social History   Socioeconomic History  . Marital status: Married    Spouse name: Not on file  . Number of children: 2  . Years of education: Not on file  . Highest education level: 10th grade  Social Needs  . Financial resource strain: Not on file  . Food insecurity - worry: Not on file  . Food insecurity - inability: Not on file  . Transportation  needs - medical: Not on file  . Transportation needs - non-medical: Not on file  Occupational History  . Not on file  Tobacco Use  . Smoking status: Former Smoker    Packs/day: 1.00    Years: 10.00    Pack years: 10.00    Types: Cigarettes    Last attempt to quit: 06/13/1965    Years since quitting: 52.0  . Smokeless tobacco: Never Used  Substance and Sexual Activity  . Alcohol use: No  . Drug use: No  . Sexual activity: No    Birth control/protection: None  Other Topics Concern  . Not on file  Social  History Narrative   Grew up in [place], [place]. Married. Has one daughter and one son. Has two grandchildren.    Did not work outside the home.   Eats all food groups.    Wear seatbelt.    Went to [employer] and graduated from [employer] in [place].    Family History  Problem Relation Age of Onset  . Cancer Mother        breast  . Stomach cancer Mother   . Heart disease Father   . Heart disease Sister   . Breast cancer Sister     ROS: Currently denies lightheadedness, dizziness, Fever, chills, CP, SOB.   No personal history of DVT, PE, MI, or CVA. No loose teeth or dentures All other systems have been reviewed and were otherwise currently negative with the exception of those mentioned in the HPI and as above.  Objective: Vitals: Ht: 5 feet 5 inches wt: 186 temp: 98 BP: 143/69 pulse: 68 O2 95 % on room air. Physical Exam: General: Alert, NAD.  Antalgic Gait  HEENT: EOMI, Good Neck Extension  Pulm: No increased work of breathing.  Clear B/L A/P w/o crackle or wheeze.  CV: RRR, No m/g/r appreciated  GI: soft, NT, ND Neuro: Neuro without gross focal deficit.  Sensation intact distally Skin: No lesions in the area of chief complaint MSK/Surgical Site: Left knee w/o redness or effusion.  Medial JLT. ROM 5 -110.  5/5 strength in extension and flexion.  +EHL/FHL.  NVI.  Stable varus and valgus stress.   Imaging Review Plain radiographs demonstrate severe anterior medial osteoarthritis with preserved lateral space and adequate medial opening on stress view.  Assessment: OA LEFT KNEE Principal Problem:   Primary osteoarthritis of left knee Active Problems:   Diastolic dysfunction   GERD (gastroesophageal reflux disease)   Plan: Plan for Procedure(s): UNICOMPARTMENTAL KNEE  The patient history, physical exam, clinical judgement of the provider and imaging are consistent with end stage degenerative joint disease and unicompartmental joint arthroplasty is deemed  medically necessary. The treatment options including medical management, injection therapy, and arthroplasty were discussed at length. The risks and benefits of Procedure(s): UNICOMPARTMENTAL KNEE were presented and reviewed.  The risks of nonoperative treatment, versus surgical intervention including but not limited to continued pain, aseptic loosening, stiffness, dislocation/subluxation, infection, bleeding, nerve injury, blood clots, cardiopulmonary complications, morbidity, mortality, among others were discussed. The patient verbalizes understanding and wishes to proceed with the plan.  Patient is being admitted for inpatient treatment for surgery, pain control, PT, OT, prophylactic antibiotics, VTE prophylaxis, progressive ambulation, ADL's and discharge planning.   Dental prophylaxis discussed and recommended for 2 years postoperatively.   The patient does meet the criteria for TXA which will be used perioperatively.    ASA 325 mg will be used postoperatively for DVT prophylaxis in addition to SCDs, and  early ambulation.  The patient is planning to be discharged home with home health services ([facility]) in care of her husband.  Severity of Illness: The appropriate patient status for this patient is OBSERVATION. Observation status is judged to be reasonable and necessary in order to provide the required intensity of service to ensure the patient's safety. The patient's presenting symptoms, physical exam findings, and initial radiographic and laboratory data in the context of their medical condition is felt to place them at decreased risk for further clinical deterioration. Furthermore, it is anticipated that the patient will be medically stable for discharge from the hospital within 2 midnights of admission. The following factors support the patient status of observation.    Prudencio Burly III, PA-C 06/16/2017 5:53 PM

## 2017-06-16 NOTE — Telephone Encounter (Signed)
Called patient regarding message below. No answer, left generic message for patient to return call.   

## 2017-06-17 NOTE — Telephone Encounter (Signed)
Advised patient of the results, and moved her appt, so she would be on the anti for a Full 7 days before we rechecked her Urine

## 2017-06-17 NOTE — Telephone Encounter (Signed)
Spoke to Philo and advised.

## 2017-06-17 NOTE — Telephone Encounter (Signed)
I spoke to Laura Carter earlier and explained the situation.

## 2017-06-17 NOTE — Telephone Encounter (Signed)
Please advise him that I will send over the information that I do have but we have been trying to get in contact with patient regards to treatment for a urinary tract infection and follow-up for her blood pressure.

## 2017-06-17 NOTE — Telephone Encounter (Signed)
Please advise surgery office that patient did have evidence of UTI and BP was elevated at time of OV. Advise them that we have been trying to contact her without success. Advise that if they speak to her to please have her contact our office. Gwen Her. Mannie Stabile, MD

## 2017-06-17 NOTE — Telephone Encounter (Signed)
Please advise patient.  

## 2017-06-24 ENCOUNTER — Ambulatory Visit: Payer: Medicare Other | Admitting: Family Medicine

## 2017-06-24 NOTE — Pre-Procedure Instructions (Signed)
OSMARA DRUMMONDS  06/24/2017      [place], [place] [address] [place] [address] Phone: [phone] Fax: [phone]    Your procedure is scheduled on Tues. Feb. 12.  Report to [facility] Admitting at 12:30 A.M.  Call this number if you have problems the morning of surgery:  [phone]   Remember:  Do not eat food or drink liquids after midnight on Mon. Feb.11   Take these medicines the morning of surgery with A SIP OF WATER : metoprolol succinate (toprol-xl),              7 days prior to surgery STOP taking any Aspirin(unless otherwise instructed by your surgeon), Aleve, Naproxen, Ibuprofen, Motrin, Advil, Goody's, BC's, all herbal medications, fish oil, and all vitamins   Do not wear jewelry, make-up or nail polish.  Do not wear lotions, powders, or perfumes, or deodorant.  Do not shave 48 hours prior to surgery.  Men may shave face and neck.  Do not bring valuables to the hospital.  [facility] is not responsible for any belongings or valuables.  Contacts, dentures or bridgework may not be worn into surgery.  Leave your suitcase in the car.  After surgery it may be brought to your room.  For patients admitted to the hospital, discharge time will be determined by your treatment team.  Patients discharged the day of surgery will not be allowed to drive home.    Special instructions:   [place]- Preparing For Surgery  Before surgery, you can play an important role. Because skin is not sterile, your skin needs to be as free of germs as possible. You can reduce the number of germs on your skin by washing with CHG (chlorahexidine gluconate) Soap before surgery.  CHG is an antiseptic cleaner which kills germs and bonds with the skin to continue killing germs even after washing.  Please do not use if you have an allergy to CHG or antibacterial soaps. If your skin becomes reddened/irritated stop using the  CHG.  Do not shave (including legs and underarms) for at least 48 hours prior to first CHG shower. It is OK to shave your face.  Please follow these instructions carefully.   1. Shower the NIGHT BEFORE SURGERY and the MORNING OF SURGERY with CHG.   2. If you chose to wash your hair, wash your hair first as usual with your normal shampoo.  3. After you shampoo, rinse your hair and body thoroughly to remove the shampoo.  4. Use CHG as you would any other liquid soap. You can apply CHG directly to the skin and wash gently with a scrungie or a clean washcloth.   5. Apply the CHG Soap to your body ONLY FROM THE NECK DOWN.  Do not use on open wounds or open sores. Avoid contact with your eyes, ears, mouth and genitals (private parts). Wash Face and genitals (private parts)  with your normal soap.  6. Wash thoroughly, paying special attention to the area where your surgery will be performed.  7. Thoroughly rinse your body with warm water from the neck down.  8. DO NOT shower/wash with your normal soap after using and rinsing off the CHG Soap.  9. Pat yourself dry with a CLEAN TOWEL.  10. Wear CLEAN PAJAMAS to bed the night before surgery, wear comfortable clothes the morning of surgery  11. Place CLEAN SHEETS on your bed the night of  your first shower and DO NOT SLEEP WITH PETS.    Day of Surgery: Do not apply any deodorants/lotions. Please wear clean clothes to the hospital/surgery center.      Please read over the following fact sheets that you were given. Coughing and Deep Breathing, Total Joint Packet, MRSA Information and Surgical Site Infection Prevention

## 2017-06-25 ENCOUNTER — Encounter (HOSPITAL_COMMUNITY)
Admission: RE | Admit: 2017-06-25 | Discharge: 2017-06-25 | Disposition: A | Discharge status: Home / Self Care | Payer: Medicare Other | Source: Ambulatory Visit | Attending: Orthopedic Surgery | Admitting: Orthopedic Surgery

## 2017-06-25 ENCOUNTER — Other Ambulatory Visit: Payer: Self-pay

## 2017-06-25 ENCOUNTER — Encounter (HOSPITAL_COMMUNITY): Payer: Self-pay

## 2017-06-25 DIAGNOSIS — Z7982 Long term (current) use of aspirin: Secondary | ICD-10-CM | POA: Diagnosis not present

## 2017-06-25 DIAGNOSIS — Z01812 Encounter for preprocedural laboratory examination: Secondary | ICD-10-CM | POA: Insufficient documentation

## 2017-06-25 DIAGNOSIS — Z01818 Encounter for other preprocedural examination: Secondary | ICD-10-CM | POA: Diagnosis not present

## 2017-06-25 DIAGNOSIS — I10 Essential (primary) hypertension: Secondary | ICD-10-CM | POA: Diagnosis not present

## 2017-06-25 DIAGNOSIS — Z87891 Personal history of nicotine dependence: Secondary | ICD-10-CM | POA: Insufficient documentation

## 2017-06-25 DIAGNOSIS — K219 Gastro-esophageal reflux disease without esophagitis: Secondary | ICD-10-CM | POA: Insufficient documentation

## 2017-06-25 DIAGNOSIS — Z79899 Other long term (current) drug therapy: Secondary | ICD-10-CM | POA: Diagnosis not present

## 2017-06-25 DIAGNOSIS — J45909 Unspecified asthma, uncomplicated: Secondary | ICD-10-CM | POA: Diagnosis not present

## 2017-06-25 HISTORY — DX: Personal history of other diseases of the digestive system: Z87.19

## 2017-06-25 LAB — BASIC METABOLIC PANEL
ANION GAP: 9 (ref 5–15)
BUN: 15 mg/dL (ref 6–20)
CHLORIDE: 108 mmol/L (ref 101–111)
CO2: 23 mmol/L (ref 22–32)
Calcium: 9.3 mg/dL (ref 8.9–10.3)
Creatinine, Ser: 0.86 mg/dL (ref 0.44–1.00)
GFR calc Af Amer: >60 mL/min (ref >60)
GLUCOSE: 97 mg/dL (ref 65–99)
POTASSIUM: 4.4 mmol/L (ref 3.5–5.1)
Sodium: 140 mmol/L (ref 135–145)

## 2017-06-25 LAB — CBC
HCT: 42.7 % (ref 36.0–46.0)
HEMOGLOBIN: 14.2 g/dL (ref 12.0–15.0)
MCH: 29.8 pg (ref 26.0–34.0)
MCHC: 33.3 g/dL (ref 30.0–36.0)
MCV: 89.7 fL (ref 78.0–100.0)
Platelets: 295 10*3/uL (ref 150–400)
RBC: 4.76 MIL/uL (ref 3.87–5.11)
RDW: 14.5 % (ref 11.5–15.5)
WBC: 5.1 10*3/uL (ref 4.0–10.5)

## 2017-06-25 LAB — GLUCOSE, CAPILLARY: GLUCOSE-CAPILLARY: 87 mg/dL (ref 65–99)

## 2017-06-25 LAB — SURGICAL PCR SCREEN
MRSA, PCR: NEGATIVE
STAPHYLOCOCCUS AUREUS: NEGATIVE

## 2017-06-25 NOTE — Progress Notes (Signed)
PCP - rachel Hagler Cardiologist - denies  Chest x-ray - 06/16/17 EKG - 06/10/17 Stress Test - > 7 years ago ECHO - denies Cardiac Cath - denies   Aspirin Instructions:stop 7 days prior  Anesthesia review: Yes for PCP to recheck blood sugar on Friday - PCP is concerned with blood sugar last a1c 6.0 05/2017 - pateint has a follow up on Friday this week for evaluation of blood sugar  Patient denies shortness of breath, fever, cough and chest pain at PAT appointment   Patient verbalized understanding of instructions that were given to them at the PAT appointment. Patient was also instructed that they will need to review over the PAT instructions again at home before surgery.

## 2017-06-25 NOTE — Progress Notes (Signed)
Anesthesia Chart Review:  Pt is a 77 year old female scheduled for L unicompartmental knee on 07/08/2017 with Edmonia Lynch, MD  - PCP is Caren Macadam, MD  PMH includes:  HTN, diastolic dysfunction, asthma, GERD. Former smoker, BMI 30.5. S/p L knee arthroscopy 06/16/15.   Medications include: ASA 81 mg, metoprolol, Prilosec  BP 124/65   Pulse 70   Temp 36.7 C   Resp 18   Ht 5\' 5"  (1.651 m)   Wt 183 lb 3.2 oz (83.1 kg)   SpO2 98%   BMI 30.49 kg/m   Preoperative labs reviewed.   - CBC and BMET 06/25/17 acceptable for surgery - Notes in Epic indicate pt dx with UTI 06/10/17 by PCP but has not yet started tx as PCP has not yet been able to reach pt.   CXR 06/16/17:  1.  No acute cardiopulmonary disease. 2. Hiatal hernia.  EKG 06/10/17: NSR. Low voltage QRS  I left voicemail for [facility] in Dr. Debroah Loop office about UTI dx by PCP.   If no changes, I anticipate pt can proceed with surgery as scheduled.   Willeen Cass, FNP-BC [facility] Short Stay [facility]/Anesthesiology Phone: [phone] 06/25/2017 4:24 PM

## 2017-06-26 ENCOUNTER — Ambulatory Visit (INDEPENDENT_AMBULATORY_CARE_PROVIDER_SITE_OTHER): Payer: Medicare Other | Admitting: Family Medicine

## 2017-06-26 ENCOUNTER — Encounter: Payer: Self-pay | Admitting: Family Medicine

## 2017-06-26 VITALS — BP 122/70 | HR 87 | Temp 98.7°F | Resp 16 | Ht 65.0 in | Wt 184.2 lb

## 2017-06-26 DIAGNOSIS — I10 Essential (primary) hypertension: Secondary | ICD-10-CM

## 2017-06-26 DIAGNOSIS — R8290 Unspecified abnormal findings in urine: Secondary | ICD-10-CM

## 2017-06-26 NOTE — Progress Notes (Signed)
Patient ID: Laura Carter, female    DOB: 12-21-1940, 77 y.o.   MRN: [id number]  No chief complaint on file.   Allergies Patient has no known allergies.  Subjective:   Laura Carter is a 77 y.o. female who presents to [facility] today.  HPI Has surgery scheduled Is here for a scheudled follow up for BP.  Has bought a blood pressure cuff and brings it into our office today to get checked.  She reports that she went to get her lab work done yesterday for surgery and her blood pressure was elevated because she got very nervous.  She sat there for a while and calm down.  Her blood pressure came back down to normal upon recheck.  She reports that she has been feeling well.  She is ready to have her surgery on July 08, 2017.  She denies any chest pain, shortness of breath or swelling in her extremities.  She comes in today to make sure her blood pressure is fine because it was elevated at her complete physical examination.  Patient also had urine culture at physical which grew out Streptococcus agalactiae.  She is completed the antibiotic.  She is here for recheck today.  She denies any dysuria, hematuria, or abnormal urine odor.    Past Medical History:  Diagnosis Date  . Asthma   . Diastolic dysfunction   . GERD (gastroesophageal reflux disease)   . History of hiatal hernia   . Hypertension     Past Surgical History:  Procedure Laterality Date  . CHOLECYSTECTOMY  10/10  . COLONOSCOPY    . ESOPHAGOGASTRODUODENOSCOPY    . EYE SURGERY     cataract right  . KNEE ARTHROSCOPY WITH MEDIAL MENISECTOMY Left 06/16/2015   Procedure: KNEE ARTHROSCOPY WITH PARTIAL MEDIAL MENISECTOMY;  Surgeon: Carole Civil, MD;  Location: [facility];  Service: Orthopedics;  Laterality: Left;    Family History  Problem Relation Age of Onset  . Cancer Mother        breast  . Stomach cancer Mother   . Heart disease Father   . Heart disease Sister   . Breast cancer Sister      Social  History   Socioeconomic History  . Marital status: Married    Spouse name: None  . Number of children: 2  . Years of education: None  . Highest education level: 10th grade  Social Needs  . Financial resource strain: None  . Food insecurity - worry: None  . Food insecurity - inability: None  . Transportation needs - medical: None  . Transportation needs - non-medical: None  Occupational History  . None  Tobacco Use  . Smoking status: Former Smoker    Packs/day: 1.00    Years: 10.00    Pack years: 10.00    Types: Cigarettes    Last attempt to quit: 06/13/1965    Years since quitting: 52.0  . Smokeless tobacco: Never Used  Substance and Sexual Activity  . Alcohol use: No  . Drug use: No  . Sexual activity: No    Birth control/protection: None  Other Topics Concern  . None  Social History Narrative   Grew up in [place], [place]. Married. Has one daughter and one son. Has two grandchildren.    Did not work outside the home.   Eats all food groups.    Wear seatbelt.    Went to [employer] and graduated from [employer] in [place].  Current Outpatient Medications on File Prior to Visit  Medication Sig Dispense Refill  . aspirin EC 81 MG tablet Take 81 mg by mouth at bedtime.    . Cholecalciferol (VITAMIN D3 PO) Take 1 tablet by mouth daily.    . Coenzyme Q10 (COQ10 PO) Take 1 capsule by mouth daily.    . Cyanocobalamin (VITAMIN B-12 PO) Take 1 tablet by mouth daily.    Marland Kitchen KRILL OIL PO Take 1 capsule by mouth daily.    . Magnesium 500 MG CAPS Take 500 mg by mouth at bedtime.     . Melatonin 3 MG TABS Take 3 mg by mouth at bedtime as needed (for sleep.).    Marland Kitchen metoprolol succinate (TOPROL-XL) 25 MG 24 hr tablet TAKE ONE TABLET BY MOUTH ONCE DAILY. 90 tablet 3  . Multiple Vitamins-Minerals (ZINC PO) Take 1 tablet by mouth daily as needed (for immune health/onset of cold).    Marland Kitchen omeprazole (PRILOSEC OTC) 20 MG tablet Take 20 mg by mouth at bedtime.    . Pyridoxine HCl  (VITAMIN B-6 PO) Take 1 tablet by mouth daily.    Marland Kitchen amoxicillin (AMOXIL) 500 MG capsule Take 1 capsule (500 mg total) by mouth 2 (two) times daily. (Patient not taking: Reported on 06/13/2017) 14 capsule 0   No current facility-administered medications on file prior to visit.     Review of Systems  Cardiovascular: Negative for chest pain, palpitations and leg swelling.     Objective:   BP 122/70 (BP Location: Left Arm, Patient Position: Sitting, Cuff Size: Normal)   Pulse 87   Temp 98.7 F (37.1 C) (Temporal)   Resp 16   Ht 5\' 5"  (1.651 m)   Wt 184 lb 4 oz (83.6 kg)   SpO2 95%   BMI 30.66 kg/m   Physical Exam  Constitutional: She is oriented to person, place, and time. She appears well-developed and well-nourished.  Cardiovascular: Normal rate, regular rhythm and normal heart sounds.  Neurological: She is alert and oriented to person, place, and time.  Skin: Skin is warm and dry. No rash noted.  Psychiatric: She has a normal mood and affect. Her behavior is normal. Judgment and thought content normal.  Vitals reviewed. Patient brings in on high blood pressure cuff today.  I helped her set up the machine and blood pressure check with this machine was 128/72.   Assessment and Plan  1. Essential hypertension Patient was counseled to continue the metoprolol as directed.  Blood pressure was normotensive today.  She will sporadically continue to monitor at home and call with any problems. Did discuss with patient continue dietary modifications due to her blood pressure and her history of  prediabetes.  Diet and weight loss recommended.  Patient's blood pressure machine was validated in our office today.  It was recommended that she is sporadically checking her blood pressure.  However she was counseled that she did not need to be checking her blood pressure multiple times a day. 2. Urine abnormality Recheck urine culture today to make sure that she is clear bacteria. - Urine  Culture  Surgery scheduled for 07/08/2017. BP looks great! No Follow-up on file. Caren Macadam, MD 06/26/2017

## 2017-06-26 NOTE — Patient Instructions (Signed)
DASH Eating Plan DASH stands for "Dietary Approaches to Stop Hypertension." The DASH eating plan is a healthy eating plan that has been shown to reduce high blood pressure (hypertension). It may also reduce your risk for type 2 diabetes, heart disease, and stroke. The DASH eating plan may also help with weight loss. What are tips for following this plan? General guidelines  Avoid eating more than 2,300 mg (milligrams) of salt (sodium) a day. If you have hypertension, you may need to reduce your sodium intake to 1,500 mg a day.  Limit alcohol intake to no more than 1 drink a day for nonpregnant women and 2 drinks a day for men. One drink equals 12 oz of beer, 5 oz of wine, or 1 oz of hard liquor.  Work with your health care provider to maintain a healthy body weight or to lose weight. Ask what an ideal weight is for you.  Get at least 30 minutes of exercise that causes your heart to beat faster (aerobic exercise) most days of the week. Activities may include walking, swimming, or biking.  Work with your health care provider or diet and nutrition specialist (dietitian) to adjust your eating plan to your individual calorie needs. Reading food labels  Check food labels for the amount of sodium per serving. Choose foods with less than 5 percent of the Daily Value of sodium. Generally, foods with less than 300 mg of sodium per serving fit into this eating plan.  To find whole grains, look for the word "whole" as the first word in the ingredient list. Shopping  Buy products labeled as "low-sodium" or "no salt added."  Buy fresh foods. Avoid canned foods and premade or frozen meals. Cooking  Avoid adding salt when cooking. Use salt-free seasonings or herbs instead of table salt or sea salt. Check with your health care provider or pharmacist before using salt substitutes.  Do not fry foods. Cook foods using healthy methods such as baking, boiling, grilling, and broiling instead.  Cook with  heart-healthy oils, such as olive, canola, soybean, or sunflower oil. Meal planning   Eat a balanced diet that includes: ? 5 or more servings of fruits and vegetables each day. At each meal, try to fill half of your plate with fruits and vegetables. ? Up to 6-8 servings of whole grains each day. ? Less than 6 oz of lean meat, poultry, or fish each day. A 3-oz serving of meat is about the same size as a deck of cards. One egg equals 1 oz. ? 2 servings of low-fat dairy each day. ? A serving of nuts, seeds, or beans 5 times each week. ? Heart-healthy fats. Healthy fats called Omega-3 fatty acids are found in foods such as flaxseeds and coldwater fish, like sardines, salmon, and mackerel.  Limit how much you eat of the following: ? Canned or prepackaged foods. ? Food that is high in trans fat, such as fried foods. ? Food that is high in saturated fat, such as fatty meat. ? Sweets, desserts, sugary drinks, and other foods with added sugar. ? Full-fat dairy products.  Do not salt foods before eating.  Try to eat at least 2 vegetarian meals each week.  Eat more home-cooked food and less restaurant, buffet, and fast food.  When eating at a restaurant, ask that your food be prepared with less salt or no salt, if possible. What foods are recommended? The items listed may not be a complete list. Talk with your dietitian about what   dietary choices are best for you. Grains Whole-grain or whole-wheat bread. Whole-grain or whole-wheat pasta. Brown rice. Oatmeal. Quinoa. Bulgur. Whole-grain and low-sodium cereals. Pita bread. Low-fat, low-sodium crackers. Whole-wheat flour tortillas. Vegetables Fresh or frozen vegetables (raw, steamed, roasted, or grilled). Low-sodium or reduced-sodium tomato and vegetable juice. Low-sodium or reduced-sodium tomato sauce and tomato paste. Low-sodium or reduced-sodium canned vegetables. Fruits All fresh, dried, or frozen fruit. Canned fruit in natural juice (without  added sugar). Meat and other protein foods Skinless chicken or turkey. Ground chicken or turkey. Pork with fat trimmed off. Fish and seafood. Egg whites. Dried beans, peas, or lentils. Unsalted nuts, nut butters, and seeds. Unsalted canned beans. Lean cuts of beef with fat trimmed off. Low-sodium, lean deli meat. Dairy Low-fat (1%) or fat-free (skim) milk. Fat-free, low-fat, or reduced-fat cheeses. Nonfat, low-sodium ricotta or cottage cheese. Low-fat or nonfat yogurt. Low-fat, low-sodium cheese. Fats and oils Soft margarine without trans fats. Vegetable oil. Low-fat, reduced-fat, or light mayonnaise and salad dressings (reduced-sodium). Canola, safflower, olive, soybean, and sunflower oils. Avocado. Seasoning and other foods Herbs. Spices. Seasoning mixes without salt. Unsalted popcorn and pretzels. Fat-free sweets. What foods are not recommended? The items listed may not be a complete list. Talk with your dietitian about what dietary choices are best for you. Grains Baked goods made with fat, such as croissants, muffins, or some breads. Dry pasta or rice meal packs. Vegetables Creamed or fried vegetables. Vegetables in a cheese sauce. Regular canned vegetables (not low-sodium or reduced-sodium). Regular canned tomato sauce and paste (not low-sodium or reduced-sodium). Regular tomato and vegetable juice (not low-sodium or reduced-sodium). Pickles. Olives. Fruits Canned fruit in a light or heavy syrup. Fried fruit. Fruit in cream or butter sauce. Meat and other protein foods Fatty cuts of meat. Ribs. Fried meat. Bacon. Sausage. Bologna and other processed lunch meats. Salami. Fatback. Hotdogs. Bratwurst. Salted nuts and seeds. Canned beans with added salt. Canned or smoked fish. Whole eggs or egg yolks. Chicken or turkey with skin. Dairy Whole or 2% milk, cream, and half-and-half. Whole or full-fat cream cheese. Whole-fat or sweetened yogurt. Full-fat cheese. Nondairy creamers. Whipped toppings.  Processed cheese and cheese spreads. Fats and oils Butter. Stick margarine. Lard. Shortening. Ghee. Bacon fat. Tropical oils, such as coconut, palm kernel, or palm oil. Seasoning and other foods Salted popcorn and pretzels. Onion salt, garlic salt, seasoned salt, table salt, and sea salt. Worcestershire sauce. Tartar sauce. Barbecue sauce. Teriyaki sauce. Soy sauce, including reduced-sodium. Steak sauce. Canned and packaged gravies. Fish sauce. Oyster sauce. Cocktail sauce. Horseradish that you find on the shelf. Ketchup. Mustard. Meat flavorings and tenderizers. Bouillon cubes. Hot sauce and Tabasco sauce. Premade or packaged marinades. Premade or packaged taco seasonings. Relishes. Regular salad dressings. Where to find more information:  National Heart, Lung, and Blood Institute: www.nhlbi.nih.gov  American Heart Association: www.heart.org Summary  The DASH eating plan is a healthy eating plan that has been shown to reduce high blood pressure (hypertension). It may also reduce your risk for type 2 diabetes, heart disease, and stroke.  With the DASH eating plan, you should limit salt (sodium) intake to 2,300 mg a day. If you have hypertension, you may need to reduce your sodium intake to 1,500 mg a day.  When on the DASH eating plan, aim to eat more fresh fruits and vegetables, whole grains, lean proteins, low-fat dairy, and heart-healthy fats.  Work with your health care provider or diet and nutrition specialist (dietitian) to adjust your eating plan to your individual   calorie needs. This information is not intended to replace advice given to you by your health care provider. Make sure you discuss any questions you have with your health care provider. Document Released: 05/02/2011 Document Revised: 05/06/2016 Document Reviewed: 05/06/2016 Elsevier Interactive Patient Education  2018 Elsevier Inc.  

## 2017-06-27 ENCOUNTER — Other Ambulatory Visit (HOSPITAL_COMMUNITY)
Admission: AD | Admit: 2017-06-27 | Discharge: 2017-06-27 | Disposition: A | Discharge status: Home / Self Care | Payer: Medicare Other

## 2017-06-27 DIAGNOSIS — J45909 Unspecified asthma, uncomplicated: Secondary | ICD-10-CM | POA: Diagnosis not present

## 2017-06-27 DIAGNOSIS — Z01818 Encounter for other preprocedural examination: Secondary | ICD-10-CM | POA: Diagnosis not present

## 2017-06-27 DIAGNOSIS — I10 Essential (primary) hypertension: Secondary | ICD-10-CM | POA: Diagnosis not present

## 2017-06-27 DIAGNOSIS — K219 Gastro-esophageal reflux disease without esophagitis: Secondary | ICD-10-CM | POA: Diagnosis not present

## 2017-06-27 DIAGNOSIS — Z87891 Personal history of nicotine dependence: Secondary | ICD-10-CM | POA: Diagnosis not present

## 2017-06-27 DIAGNOSIS — Z01812 Encounter for preprocedural laboratory examination: Secondary | ICD-10-CM | POA: Diagnosis not present

## 2017-06-28 LAB — URINE CULTURE

## 2017-06-30 ENCOUNTER — Other Ambulatory Visit: Payer: Self-pay | Admitting: Family Medicine

## 2017-07-07 MED ORDER — TRANEXAMIC ACID 1000 MG/10ML IV SOLN
1000.0000 mg | INTRAVENOUS | Status: AC
  Administered 2017-07-08: 1000 mg via INTRAVENOUS
  Filled 2017-07-07: qty 1100

## 2017-07-07 NOTE — Anesthesia Preprocedure Evaluation (Signed)
Anesthesia Evaluation  Patient identified by MRN, date of birth, ID band Patient awake    Reviewed: Allergy & Precautions, H&P , Patient's Chart, lab work & pertinent test results, reviewed documented beta blocker date and time   Airway Mallampati: II  TM Distance: >3 FB Neck ROM: full    Dental no notable dental hx.    Pulmonary former smoker,    Pulmonary exam normal breath sounds clear to auscultation       Cardiovascular hypertension,  Rhythm:regular Rate:Normal     Neuro/Psych    GI/Hepatic   Endo/Other    Renal/GU      Musculoskeletal   Abdominal   Peds  Hematology   Anesthesia Other Findings   Reproductive/Obstetrics                             Anesthesia Physical Anesthesia Plan  ASA: II  Anesthesia Plan: Spinal   Post-op Pain Management:    Induction:   PONV Risk Score and Plan: 2 and Treatment may vary due to age or medical condition, Dexamethasone and Ondansetron  Airway Management Planned:   Additional Equipment:   Intra-op Plan:   Post-operative Plan:   Informed Consent: I have reviewed the patients History and Physical, chart, labs and discussed the procedure including the risks, benefits and alternatives for the proposed anesthesia with the patient or authorized representative who has indicated his/her understanding and acceptance.   Dental Advisory Given  Plan Discussed with: CRNA and Surgeon  Anesthesia Plan Comments: (  )        Anesthesia Quick Evaluation

## 2017-07-08 ENCOUNTER — Observation Stay (HOSPITAL_COMMUNITY): Payer: Medicare Other

## 2017-07-08 ENCOUNTER — Observation Stay (HOSPITAL_COMMUNITY)
Admission: RE | Admit: 2017-07-08 | Discharge: 2017-07-09 | Disposition: A | Discharge status: Home / Self Care | Payer: Medicare Other | Source: Ambulatory Visit | Attending: Orthopedic Surgery | Admitting: Orthopedic Surgery

## 2017-07-08 ENCOUNTER — Encounter (HOSPITAL_COMMUNITY): Payer: Self-pay

## 2017-07-08 ENCOUNTER — Inpatient Hospital Stay (HOSPITAL_COMMUNITY): Payer: Medicare Other | Admitting: Emergency Medicine

## 2017-07-08 ENCOUNTER — Other Ambulatory Visit: Payer: Self-pay

## 2017-07-08 ENCOUNTER — Telehealth: Payer: Self-pay | Admitting: Family Medicine

## 2017-07-08 ENCOUNTER — Encounter (HOSPITAL_COMMUNITY)
Admission: RE | Disposition: A | Discharge status: Home / Self Care | Payer: Self-pay | Source: Ambulatory Visit | Attending: Orthopedic Surgery

## 2017-07-08 ENCOUNTER — Inpatient Hospital Stay (HOSPITAL_COMMUNITY): Payer: Medicare Other | Admitting: Certified Registered"

## 2017-07-08 DIAGNOSIS — I10 Essential (primary) hypertension: Secondary | ICD-10-CM | POA: Diagnosis not present

## 2017-07-08 DIAGNOSIS — Z7982 Long term (current) use of aspirin: Secondary | ICD-10-CM | POA: Diagnosis not present

## 2017-07-08 DIAGNOSIS — M1712 Unilateral primary osteoarthritis, left knee: Secondary | ICD-10-CM | POA: Diagnosis not present

## 2017-07-08 DIAGNOSIS — Z471 Aftercare following joint replacement surgery: Secondary | ICD-10-CM | POA: Diagnosis not present

## 2017-07-08 DIAGNOSIS — K219 Gastro-esophageal reflux disease without esophagitis: Secondary | ICD-10-CM | POA: Diagnosis not present

## 2017-07-08 DIAGNOSIS — Z96659 Presence of unspecified artificial knee joint: Secondary | ICD-10-CM

## 2017-07-08 DIAGNOSIS — Z96652 Presence of left artificial knee joint: Secondary | ICD-10-CM | POA: Diagnosis not present

## 2017-07-08 DIAGNOSIS — Z79899 Other long term (current) drug therapy: Secondary | ICD-10-CM | POA: Insufficient documentation

## 2017-07-08 DIAGNOSIS — Z87891 Personal history of nicotine dependence: Secondary | ICD-10-CM | POA: Diagnosis not present

## 2017-07-08 DIAGNOSIS — I5189 Other ill-defined heart diseases: Secondary | ICD-10-CM | POA: Diagnosis present

## 2017-07-08 DIAGNOSIS — G8918 Other acute postprocedural pain: Secondary | ICD-10-CM | POA: Diagnosis not present

## 2017-07-08 DIAGNOSIS — M1710 Unilateral primary osteoarthritis, unspecified knee: Secondary | ICD-10-CM | POA: Diagnosis present

## 2017-07-08 HISTORY — PX: PARTIAL KNEE ARTHROPLASTY: SHX2174

## 2017-07-08 HISTORY — DX: Unilateral primary osteoarthritis, left knee: M17.12

## 2017-07-08 SURGERY — ARTHROPLASTY, KNEE, UNICOMPARTMENTAL
Anesthesia: Spinal | Site: Knee | Laterality: Left

## 2017-07-08 MED ORDER — PANTOPRAZOLE SODIUM 40 MG PO TBEC
40.0000 mg | DELAYED_RELEASE_TABLET | Freq: Every day | ORAL | Status: DC
  Administered 2017-07-08: 40 mg via ORAL
  Filled 2017-07-08 (×2): qty 1

## 2017-07-08 MED ORDER — POLYETHYLENE GLYCOL 3350 17 G PO PACK: 17.0000 g | PACK | Freq: Every day | ORAL | Status: DC | PRN

## 2017-07-08 MED ORDER — 0.9 % SODIUM CHLORIDE (POUR BTL) OPTIME
TOPICAL | Status: DC | PRN
  Administered 2017-07-08: 1000 mL

## 2017-07-08 MED ORDER — KETOROLAC TROMETHAMINE 30 MG/ML IJ SOLN
INTRAMUSCULAR | Status: DC | PRN
  Administered 2017-07-08: 30 mg via INTRAVENOUS

## 2017-07-08 MED ORDER — KETOROLAC TROMETHAMINE 30 MG/ML IJ SOLN
INTRAMUSCULAR | Status: AC
  Filled 2017-07-08: qty 1

## 2017-07-08 MED ORDER — LACTATED RINGERS IV SOLN
INTRAVENOUS | Status: DC
  Administered 2017-07-08 (×2): via INTRAVENOUS

## 2017-07-08 MED ORDER — HYDROMORPHONE HCL 1 MG/ML IJ SOLN: 0.5000 mg | INTRAMUSCULAR | Status: DC | PRN

## 2017-07-08 MED ORDER — ACETAMINOPHEN 500 MG PO TABS: 1000.0000 mg | ORAL_TABLET | Freq: Three times a day (TID) | ORAL | 0 refills | Status: AC

## 2017-07-08 MED ORDER — SODIUM CHLORIDE 0.9 % IR SOLN
Status: DC | PRN
  Administered 2017-07-08: 3000 mL

## 2017-07-08 MED ORDER — PROPOFOL 10 MG/ML IV BOLUS
INTRAVENOUS | Status: DC | PRN
  Administered 2017-07-08: 20 mg via INTRAVENOUS

## 2017-07-08 MED ORDER — PHENOL 1.4 % MT LIQD: 1.0000 | OROMUCOSAL | Status: DC | PRN

## 2017-07-08 MED ORDER — ASPIRIN EC 325 MG PO TBEC
325.0000 mg | DELAYED_RELEASE_TABLET | Freq: Every day | ORAL | Status: DC
  Administered 2017-07-09: 325 mg via ORAL
  Filled 2017-07-08: qty 1

## 2017-07-08 MED ORDER — DOCUSATE SODIUM 100 MG PO CAPS
100.0000 mg | ORAL_CAPSULE | Freq: Two times a day (BID) | ORAL | Status: DC
  Administered 2017-07-08 – 2017-07-09 (×2): 100 mg via ORAL
  Filled 2017-07-08 (×2): qty 1

## 2017-07-08 MED ORDER — CELECOXIB 200 MG PO CAPS: 200.0000 mg | ORAL_CAPSULE | Freq: Two times a day (BID) | ORAL | 0 refills | Status: DC

## 2017-07-08 MED ORDER — ASPIRIN EC 325 MG PO TBEC: 325.0000 mg | DELAYED_RELEASE_TABLET | Freq: Every day | ORAL | 0 refills | Status: DC

## 2017-07-08 MED ORDER — OXYCODONE HCL 5 MG PO TABS: 5.0000 mg | ORAL_TABLET | ORAL | Status: DC | PRN

## 2017-07-08 MED ORDER — SORBITOL 70 % SOLN: 30.0000 mL | Freq: Every day | Status: DC | PRN

## 2017-07-08 MED ORDER — CHLORHEXIDINE GLUCONATE 4 % EX LIQD: 60.0000 mL | Freq: Once | CUTANEOUS | Status: DC

## 2017-07-08 MED ORDER — CEFAZOLIN SODIUM-DEXTROSE 2-4 GM/100ML-% IV SOLN
2.0000 g | INTRAVENOUS | Status: AC
  Administered 2017-07-08: 2 g via INTRAVENOUS
  Filled 2017-07-08: qty 100

## 2017-07-08 MED ORDER — GABAPENTIN 300 MG PO CAPS
300.0000 mg | ORAL_CAPSULE | Freq: Once | ORAL | Status: AC
  Administered 2017-07-08: 300 mg via ORAL
  Filled 2017-07-08: qty 1

## 2017-07-08 MED ORDER — ACETAMINOPHEN 500 MG PO TABS
1000.0000 mg | ORAL_TABLET | Freq: Three times a day (TID) | ORAL | Status: DC
  Administered 2017-07-08 – 2017-07-09 (×4): 1000 mg via ORAL
  Filled 2017-07-08 (×4): qty 2

## 2017-07-08 MED ORDER — METOCLOPRAMIDE HCL 5 MG/ML IJ SOLN: 5.0000 mg | Freq: Three times a day (TID) | INTRAMUSCULAR | Status: DC | PRN

## 2017-07-08 MED ORDER — SODIUM CHLORIDE FLUSH 0.9 % IV SOLN
INTRAVENOUS | Status: DC | PRN
  Administered 2017-07-08: 30 mL

## 2017-07-08 MED ORDER — PHENYLEPHRINE HCL 10 MG/ML IJ SOLN
INTRAVENOUS | Status: DC | PRN
  Administered 2017-07-08: 20 ug/min via INTRAVENOUS

## 2017-07-08 MED ORDER — OXYCODONE HCL 5 MG PO TABS: 10.0000 mg | ORAL_TABLET | ORAL | Status: DC | PRN

## 2017-07-08 MED ORDER — ONDANSETRON HCL 4 MG/2ML IJ SOLN
INTRAMUSCULAR | Status: DC | PRN
  Administered 2017-07-08: 4 mg via INTRAVENOUS

## 2017-07-08 MED ORDER — BUPIVACAINE LIPOSOME 1.3 % IJ SUSP
20.0000 mL | Freq: Once | INTRAMUSCULAR | Status: AC
  Filled 2017-07-08: qty 20

## 2017-07-08 MED ORDER — DEXAMETHASONE SODIUM PHOSPHATE 10 MG/ML IJ SOLN
10.0000 mg | Freq: Once | INTRAMUSCULAR | Status: AC
  Administered 2017-07-09: 10 mg via INTRAVENOUS
  Filled 2017-07-08: qty 1

## 2017-07-08 MED ORDER — CELECOXIB 200 MG PO CAPS
200.0000 mg | ORAL_CAPSULE | Freq: Two times a day (BID) | ORAL | Status: DC
  Administered 2017-07-09: 200 mg via ORAL
  Filled 2017-07-08: qty 1

## 2017-07-08 MED ORDER — METHOCARBAMOL 500 MG PO TABS: 500.0000 mg | ORAL_TABLET | Freq: Four times a day (QID) | ORAL | Status: DC | PRN

## 2017-07-08 MED ORDER — BUPIVACAINE LIPOSOME 1.3 % IJ SUSP
INTRAMUSCULAR | Status: DC | PRN
  Administered 2017-07-08: 20 mL

## 2017-07-08 MED ORDER — SENNA 8.6 MG PO TABS
1.0000 | ORAL_TABLET | Freq: Two times a day (BID) | ORAL | Status: DC
  Administered 2017-07-09: 8.6 mg via ORAL
  Filled 2017-07-08: qty 1

## 2017-07-08 MED ORDER — BUPIVACAINE-EPINEPHRINE (PF) 0.5% -1:200000 IJ SOLN
INTRAMUSCULAR | Status: DC | PRN
  Administered 2017-07-08: 25 mL via PERINEURAL

## 2017-07-08 MED ORDER — MAGNESIUM OXIDE 400 (241.3 MG) MG PO TABS: 400.0000 mg | ORAL_TABLET | Freq: Every day | ORAL | Status: DC

## 2017-07-08 MED ORDER — MENTHOL 3 MG MT LOZG: 1.0000 | LOZENGE | OROMUCOSAL | Status: DC | PRN

## 2017-07-08 MED ORDER — DIPHENHYDRAMINE HCL 12.5 MG/5ML PO ELIX: 12.5000 mg | ORAL_SOLUTION | ORAL | Status: DC | PRN

## 2017-07-08 MED ORDER — CEFAZOLIN SODIUM-DEXTROSE 1-4 GM/50ML-% IV SOLN
1.0000 g | Freq: Four times a day (QID) | INTRAVENOUS | Status: AC
  Administered 2017-07-08 (×2): 1 g via INTRAVENOUS
  Filled 2017-07-08 (×2): qty 50

## 2017-07-08 MED ORDER — ONDANSETRON HCL 4 MG/2ML IJ SOLN: 4.0000 mg | Freq: Four times a day (QID) | INTRAMUSCULAR | Status: DC | PRN

## 2017-07-08 MED ORDER — ACETAMINOPHEN 500 MG PO TABS
1000.0000 mg | ORAL_TABLET | Freq: Once | ORAL | Status: AC
  Administered 2017-07-08: 1000 mg via ORAL
  Filled 2017-07-08: qty 2

## 2017-07-08 MED ORDER — MIDAZOLAM HCL 2 MG/2ML IJ SOLN
INTRAMUSCULAR | Status: AC
  Filled 2017-07-08: qty 2

## 2017-07-08 MED ORDER — METOPROLOL SUCCINATE ER 25 MG PO TB24
25.0000 mg | ORAL_TABLET | Freq: Every day | ORAL | Status: DC
  Filled 2017-07-08: qty 1

## 2017-07-08 MED ORDER — OXYCODONE HCL 5 MG PO TABS: 5.0000 mg | ORAL_TABLET | ORAL | 0 refills | Status: AC | PRN

## 2017-07-08 MED ORDER — DOCUSATE SODIUM 100 MG PO CAPS: 100.0000 mg | ORAL_CAPSULE | Freq: Two times a day (BID) | ORAL | 0 refills | Status: DC

## 2017-07-08 MED ORDER — METHOCARBAMOL 1000 MG/10ML IJ SOLN: 500.0000 mg | Freq: Four times a day (QID) | INTRAVENOUS | Status: DC | PRN

## 2017-07-08 MED ORDER — PROPOFOL 500 MG/50ML IV EMUL
INTRAVENOUS | Status: DC | PRN
  Administered 2017-07-08: 65 ug/kg/min via INTRAVENOUS

## 2017-07-08 MED ORDER — ACETAMINOPHEN 650 MG RE SUPP: 650.0000 mg | RECTAL | Status: DC | PRN

## 2017-07-08 MED ORDER — ONDANSETRON HCL 4 MG/2ML IJ SOLN
INTRAMUSCULAR | Status: AC
  Filled 2017-07-08: qty 2

## 2017-07-08 MED ORDER — DEXAMETHASONE SODIUM PHOSPHATE 10 MG/ML IJ SOLN
INTRAMUSCULAR | Status: AC
  Filled 2017-07-08: qty 1

## 2017-07-08 MED ORDER — ONDANSETRON HCL 4 MG PO TABS: 4.0000 mg | ORAL_TABLET | Freq: Four times a day (QID) | ORAL | Status: DC | PRN

## 2017-07-08 MED ORDER — BACLOFEN 10 MG PO TABS: 10.0000 mg | ORAL_TABLET | Freq: Three times a day (TID) | ORAL | 0 refills | Status: DC | PRN

## 2017-07-08 MED ORDER — MIDAZOLAM HCL 5 MG/5ML IJ SOLN
INTRAMUSCULAR | Status: DC | PRN
  Administered 2017-07-08 (×2): 1 mg via INTRAVENOUS

## 2017-07-08 MED ORDER — ONDANSETRON HCL 4 MG PO TABS: 4.0000 mg | ORAL_TABLET | Freq: Three times a day (TID) | ORAL | 0 refills | Status: DC | PRN

## 2017-07-08 MED ORDER — DEXAMETHASONE SODIUM PHOSPHATE 10 MG/ML IJ SOLN
INTRAMUSCULAR | Status: DC | PRN
  Administered 2017-07-08: 5 mg via INTRAVENOUS

## 2017-07-08 MED ORDER — PROPOFOL 10 MG/ML IV BOLUS
INTRAVENOUS | Status: AC
  Filled 2017-07-08: qty 20

## 2017-07-08 MED ORDER — FENTANYL CITRATE (PF) 250 MCG/5ML IJ SOLN
INTRAMUSCULAR | Status: AC
  Filled 2017-07-08: qty 5

## 2017-07-08 MED ORDER — METOCLOPRAMIDE HCL 5 MG PO TABS: 5.0000 mg | ORAL_TABLET | Freq: Three times a day (TID) | ORAL | Status: DC | PRN

## 2017-07-08 MED ORDER — LACTATED RINGERS IV SOLN
INTRAVENOUS | Status: DC
  Administered 2017-07-08: 07:00:00 via INTRAVENOUS

## 2017-07-08 MED ORDER — OMEPRAZOLE MAGNESIUM 20 MG PO TBEC: 20.0000 mg | DELAYED_RELEASE_TABLET | Freq: Every day | ORAL | Status: DC

## 2017-07-08 MED ORDER — ACETAMINOPHEN 325 MG PO TABS
650.0000 mg | ORAL_TABLET | ORAL | Status: DC | PRN
  Administered 2017-07-08: 650 mg via ORAL

## 2017-07-08 MED ORDER — FENTANYL CITRATE (PF) 250 MCG/5ML IJ SOLN
INTRAMUSCULAR | Status: DC | PRN
  Administered 2017-07-08: 50 ug via INTRAVENOUS

## 2017-07-08 MED ORDER — FENTANYL CITRATE (PF) 250 MCG/5ML IJ SOLN
INTRAMUSCULAR | Status: AC
Start: 2017-07-08 — End: 2017-07-08
  Filled 2017-07-08: qty 5

## 2017-07-08 SURGICAL SUPPLY — 55 items
BANDAGE ESMARK 6X9 LF (GAUZE/BANDAGES/DRESSINGS) ×1 IMPLANT
BNDG CMPR 9X6 STRL LF SNTH (GAUZE/BANDAGES/DRESSINGS) ×1
BNDG ESMARK 6X9 LF (GAUZE/BANDAGES/DRESSINGS) ×3
BONE CEMENT PALACOSE (Cement) ×3 IMPLANT
BOWL SMART MIX CTS (DISPOSABLE) ×3 IMPLANT
CAPT KNEE PARTIAL 2 ×2 IMPLANT
CEMENT BONE PALACOSE (Cement) ×1 IMPLANT
CHLORAPREP W/TINT 26ML (MISCELLANEOUS) ×5 IMPLANT
CLOSURE STERI-STRIP 1/2X4 (GAUZE/BANDAGES/DRESSINGS) ×1
CLSR STERI-STRIP ANTIMIC 1/2X4 (GAUZE/BANDAGES/DRESSINGS) ×2 IMPLANT
COVER BACK TABLE 80X110 HD (DRAPES) ×2 IMPLANT
COVER SURGICAL LIGHT HANDLE (MISCELLANEOUS) ×3 IMPLANT
CUFF TOURNIQUET SINGLE 34IN LL (TOURNIQUET CUFF) ×3 IMPLANT
DRAPE ARTHROSCOPY W/POUCH 114 (DRAPES) ×3 IMPLANT
DRAPE U-SHAPE 47X51 STRL (DRAPES) ×3 IMPLANT
DRSG MEPILEX BORDER 4X4 (GAUZE/BANDAGES/DRESSINGS) IMPLANT
DRSG MEPILEX BORDER 4X8 (GAUZE/BANDAGES/DRESSINGS) ×3 IMPLANT
ELECT CAUTERY BLADE 6.4 (BLADE) ×3 IMPLANT
ELECT REM PT RETURN 9FT ADLT (ELECTROSURGICAL) ×3
ELECTRODE REM PT RTRN 9FT ADLT (ELECTROSURGICAL) ×1 IMPLANT
FACESHIELD WRAPAROUND (MASK) ×6 IMPLANT
FACESHIELD WRAPAROUND OR TEAM (MASK) ×2 IMPLANT
GLOVE BIO SURGEON STRL SZ7.5 (GLOVE) ×6 IMPLANT
GLOVE BIOGEL PI IND STRL 8 (GLOVE) ×2 IMPLANT
GLOVE BIOGEL PI INDICATOR 8 (GLOVE) ×4
GOWN STRL REUS W/ TWL LRG LVL3 (GOWN DISPOSABLE) ×2 IMPLANT
GOWN STRL REUS W/ TWL XL LVL3 (GOWN DISPOSABLE) ×1 IMPLANT
GOWN STRL REUS W/TWL LRG LVL3 (GOWN DISPOSABLE) ×6
GOWN STRL REUS W/TWL XL LVL3 (GOWN DISPOSABLE) ×3
HANDPIECE INTERPULSE COAX TIP (DISPOSABLE) ×3
IMMOBILIZER KNEE 22 UNIV (SOFTGOODS) ×3 IMPLANT
KIT BASIN OR (CUSTOM PROCEDURE TRAY) ×3 IMPLANT
KIT ROOM TURNOVER OR (KITS) ×3 IMPLANT
MANIFOLD NEPTUNE II (INSTRUMENTS) ×3 IMPLANT
NS IRRIG 1000ML POUR BTL (IV SOLUTION) ×3 IMPLANT
PACK BLADE SAW RECIP 70 3 PT (BLADE) ×2 IMPLANT
PACK TOTAL JOINT (CUSTOM PROCEDURE TRAY) ×3 IMPLANT
PAD ARMBOARD 7.5X6 YLW CONV (MISCELLANEOUS) ×3 IMPLANT
SET HNDPC FAN SPRY TIP SCT (DISPOSABLE) ×1 IMPLANT
STAPLER VISISTAT 35W (STAPLE) IMPLANT
SUCTION FRAZIER HANDLE 10FR (MISCELLANEOUS)
SUCTION TUBE FRAZIER 10FR DISP (MISCELLANEOUS) IMPLANT
SUT MNCRL AB 4-0 PS2 18 (SUTURE) ×3 IMPLANT
SUT MON AB 2-0 CT1 27 (SUTURE) ×3 IMPLANT
SUT MON AB 2-0 CT1 36 (SUTURE) ×2 IMPLANT
SUT VIC AB 0 CT1 27 (SUTURE) ×3
SUT VIC AB 0 CT1 27XBRD ANTBC (SUTURE) ×1 IMPLANT
SUT VIC AB 0 CT2 27 (SUTURE) ×2 IMPLANT
SUT VIC AB 0 CTX 36 (SUTURE) ×3
SUT VIC AB 0 CTX36XBRD ANTBCTR (SUTURE) IMPLANT
SUT VIC AB 1 CT1 27 (SUTURE) ×12
SUT VIC AB 1 CT1 27XBRD ANBCTR (SUTURE) IMPLANT
SUT VIC AB 1 CT1 27XBRD ANTBC (SUTURE) ×2 IMPLANT
TOWEL OR 17X26 10 PK STRL BLUE (TOWEL DISPOSABLE) ×3 IMPLANT
TRAY URETHRAL FOLEY CATH 14FR (CATHETERS) ×2 IMPLANT

## 2017-07-08 NOTE — Progress Notes (Signed)
Orthopedic Tech Progress Note Patient Details:  Laura Carter 10/16/1940 [id number]  CPM Left Knee CPM Left Knee: On Left Knee Flexion (Degrees): 90 Left Knee Extension (Degrees): 0 Additional Comments: trapeze bar patient helper  Post Interventions Patient Tolerated: Well Instructions Provided: Care of device  Hildred Priest 07/08/2017, 10:31 AM Viewed order from doctor's order list

## 2017-07-08 NOTE — Care Management Note (Signed)
Case Management Note  Patient Details  Name: Laura Carter MRN: [id number] Date of Birth: [birth year]  Subjective/Objective:     Unicompartmental Knee               Action/Plan: Spoke to pt and offered choice for HH/list provided. Pt has RW, CPM and 3n1 bedside commode at home delivered by [employer]. Pt agreeable to [facility]. Contacted [facility] with new referral.   Expected Discharge Date:                  Expected Discharge Plan:  [place]  In-House Referral:  NA  Discharge planning Services  CM Consult  Post Acute Care Choice:  Home Health Choice offered to:  Patient  DME Arranged:  CPM, 3-N-1, Walker rolling DME Agency:  [employer]  HH Arranged:  PT [place]:  [facility] [employer]  Status of Service:  Completed, signed off  If discussed at [employer], dates discussed:    Additional Comments:  Erenest Rasher, RN 07/08/2017, 3:46 PM

## 2017-07-08 NOTE — Transfer of Care (Signed)
Immediate Anesthesia Transfer of Care Note  Patient: [name]  Procedure(s) Performed: UNICOMPARTMENTAL KNEE (Left Knee)  Patient Location: PACU  Anesthesia Type:Spinal  Level of Consciousness: drowsy and patient cooperative  Airway & Oxygen Therapy: Patient Spontanous Breathing and Patient connected to face mask oxygen  Post-op Assessment: Report given to RN and Post -op Vital signs reviewed and stable  Post vital signs: Reviewed and stable  Last Vitals:  Vitals:   07/08/17 0624  BP: 138/69  Pulse: 63  Resp: 18  Temp: 36.5 C  SpO2: 99%    Last Pain:  Vitals:   07/08/17 0635  TempSrc:   PainSc: 0-No pain      Patients Stated Pain Goal: 3 (66/44/03 4742)  Complications: No apparent anesthesia complications

## 2017-07-08 NOTE — Op Note (Signed)
07/08/2017  8:33 AM  PATIENT:  Laura Carter    PRE-OPERATIVE DIAGNOSIS:  OA LEFT KNEE  POST-OPERATIVE DIAGNOSIS:  Same  PROCEDURE:  UNICOMPARTMENTAL KNEE  SURGEON:  Elmire Amrein, Ernesta Amble, MD  PHYSICIAN ASSISTANT: Roxan Hockey, PA-C, he was present and scrubbed throughout the case, critical for completion in a timely fashion, and for retraction, instrumentation, and closure.   ANESTHESIA:   General  PREOPERATIVE INDICATIONS:  Laura Carter is a  77 y.o. female with a diagnosis of OA LEFT KNEE who failed conservative measures and elected for surgical management.    The risks benefits and alternatives were discussed with the patient preoperatively including but not limited to the risks of infection, bleeding, nerve injury, cardiopulmonary complications, blood clots, the need for revision surgery, among others, and the patient was willing to proceed.  OPERATIVE IMPLANTS: Bi[employer] [place] mobile bearing medial compartment arthroplasty. Femoral Component: small. Tibial tray: C, Size 4 poly.   OPERATIVE FINDINGS: Endstage grade 4 medial compartment osteoarthritis. No significant changes in the lateral or patellofemoral joint  OPERATIVE PROCEDURE: The patient was brought to the operating room placed in supine position. General anesthesia was administered. IV antibiotics were given. The lower extremity was placed in the legholder and prepped and draped in usual sterile fashion.  Time out was performed.  The leg was elevated and exsanguinated and the tourniquet was inflated. Anteromedial incision was performed, and I took care to preserve the MCL. Parapatellar incision was carried out, and the osteophytes were excised, along with the medial meniscus and a small portion of the fat pad.  The extra medullary tibial cutting jig was applied, using the spoon and the 36mm G-Clamp, and I took care to protect the anterior cruciate ligament insertion and the tibial spine. The medial collateral ligament was  also protected, and I resected my proximal tibia, matching the anatomic slope.   The proximal tibial bony cut was removed in one piece, and I turned my attention to the femur.  The intramedullary femoral rod was placed using the drill, and then using the appropriate reference, I assembled the femoral jig, setting my posterior cutting block. I resected my posterior femur, and then measured my gap.   I then used the mill to match the extension gap to the flexion gap. The gaps were then measured again with the appropriate feeler gauges. Once I had balanced flexion and extension gaps, I then completed the preparation of the femur.  I milled off the anterior aspect of the distal femur to prevent impingement. I also exposed the tibia, and selected the above-named component, and then used the cutting jig to prepare the keel slot on the tibia. I also used the awl to curette out the bone to complete the preparation of the keel. The back wall was intact.  I then placed trial components, and it was found to have excellent motion, and appropriate balance.  I then cemented the components into place, cementing the tibia first, removing all excess cement, and then cementing the femur.  All loose cement was removed.  The real polyethylene insert was applied manually, and the knee was taken through functional range of motion, and found to have excellent stability and restoration of joint motion, with excellent balance.  The wounds were irrigated copiously, and the parapatellar tissue closed with Vicryl, followed by Vicryl for the subcutaneous tissue, with routine closure with Steri-Strips and sterile gauze.  The tourniquet was released, and the patient was awakened and extubated and returned to PACU in  stable and satisfactory condition. There were no complications.  POSTOPERATIVE PLAN: DVT px will consist of SCD's and mobilization with chemical px, WBAT     Renette Butters, MD

## 2017-07-08 NOTE — Telephone Encounter (Signed)
[place] at [facility], left message on nurse line regarding patient. She states the received the order for the bone density. She states that the diagnosis associated with the test is post-menopause. She states that Medicare does not cover the bone density under that diagnosis code. She asks that it be changed to estrogen deficiency as Medicare will pay for that.  Callback# [phone]

## 2017-07-08 NOTE — Anesthesia Procedure Notes (Addendum)
Anesthesia Regional Block: Adductor canal block   Pre-Anesthetic Checklist: ,, timeout performed, Correct Patient, Correct Site, Correct Laterality, Correct Procedure, Correct Position, site marked, Risks and benefits discussed, pre-op evaluation,  At surgeon's request and post-op pain management  Laterality: Left  Prep: chloraprep       Needles:   Needle Type: Echogenic Needle     Needle Length: 9cm  Needle Gauge: 21     Additional Needles:   Procedures:,,,, ultrasound used (permanent image in chart),,,,  Narrative:  Start time: 07/08/2017 7:04 AM End time: 07/08/2017 7:10 AM Injection made incrementally with aspirations every 5 mL. Anesthesiologist: Lyndle Herrlich, MD

## 2017-07-08 NOTE — Discharge Instructions (Signed)

## 2017-07-08 NOTE — Interval H&P Note (Signed)
History and Physical Interval Note:  07/08/2017 7:17 AM  [name]  has presented today for surgery, with the diagnosis of OA LEFT KNEE  The various methods of treatment have been discussed with the patient and family. After consideration of risks, benefits and other options for treatment, the patient has consented to  Procedure(s): UNICOMPARTMENTAL KNEE (Left) as a surgical intervention .  The patient's history has been reviewed, patient examined, no change in status, stable for surgery.  I have reviewed the patient's chart and labs.  Questions were answered to the patient's satisfaction.     Roizy Harold D

## 2017-07-08 NOTE — Evaluation (Signed)
Physical Therapy Evaluation Patient Details Name: Laura Carter MRN: [id number] DOB: Mar 20, 1941 Today's Date: 07/08/2017   History of Present Illness  Pt is a 77 y/o female s/p elective L partial knee replacement. PMH inlucdes HTN, asthma, and L knee arthroscopy.   Clinical Impression  Pt s/p surgery above with deficits below. Pt requiring min to min guard assist for mobility this session with RW. Supine HEP initiated and reviewed knee precautions. Will continue to follow acutely to maximize functional mobility independence and safety.     Follow Up Recommendations DC plan and follow up therapy as arranged by surgeon;Supervision for mobility/OOB    Equipment Recommendations  None recommended by PT    Recommendations for Other Services       Precautions / Restrictions Precautions Precautions: Knee Precaution Booklet Issued: Yes (comment) Precaution Comments: Reviewed supine ther ex with pt.  Restrictions Weight Bearing Restrictions: Yes LLE Weight Bearing: Weight bearing as tolerated      Mobility  Bed Mobility Overal bed mobility: Needs Assistance Bed Mobility: Supine to Sit     Supine to sit: Min assist     General bed mobility comments: Min A for trunk elevation. Required increased time and use of bed rails.   Transfers Overall transfer level: Needs assistance Equipment used: Rolling walker (2 wheeled) Transfers: Sit to/from [facility] Sit to Stand: Min assist Stand pivot transfers: Min guard       General transfer comment: Min A for lift assist and steadying to stand. Verbal cues for safe hand placement. Min guard for safety to perform stand pivot to chair. Verbal cues for sequencing using RW.   Ambulation/Gait Ambulation/Gait assistance: Min guard Ambulation Distance (Feet): 5 Feet Assistive device: Rolling walker (2 wheeled) Gait Pattern/deviations: Step-to pattern;Decreased step length - right;Decreased step length - left;Decreased weight  shift to left;Antalgic Gait velocity: Decreased  Gait velocity interpretation: Below normal speed for age/gender General Gait Details: Slow, antalgic gait. Verbal cues for sequencing. Pt reports feeling woozy, therefore distance limited to chair. Verbal cues for sequencing using RW.  Stairs            Wheelchair Mobility    Modified Rankin (Stroke Patients Only)       Balance Overall balance assessment: Needs assistance Sitting-balance support: No upper extremity supported;Feet supported Sitting balance-Leahy Scale: Good     Standing balance support: Bilateral upper extremity supported;During functional activity Standing balance-Leahy Scale: Poor Standing balance comment: Reliant on BUE support.                              Pertinent Vitals/Pain Pain Assessment: 0-10 Pain Score: 2  Pain Location: L knee  Pain Descriptors / Indicators: Discomfort;Operative site guarding Pain Intervention(s): Monitored during session;Limited activity within patient's tolerance;Repositioned    Home Living Family/patient expects to be discharged to:: Private residence Living Arrangements: Spouse/significant other Available Help at Discharge: Family;Available 24 hours/day Type of Home: House Home Access: Stairs to enter Entrance Stairs-Rails: None Entrance Stairs-Number of Steps: 3 Home Layout: Multi-level(has an Media planner ) Home Equipment: Toilet riser;Bedside commode;Walker - 2 wheels;Cane - single point      Prior Function Level of Independence: Independent with assistive device(s)         Comments: Used cane for longer distances, otherwise independent.      Hand Dominance   Dominant Hand: Right    Extremity/Trunk Assessment   Upper Extremity Assessment Upper Extremity Assessment: Defer to OT evaluation  Lower Extremity Assessment Lower Extremity Assessment: LLE deficits/detail LLE Deficits / Details: Slightly decreased sensation. Able to perform ther  ex below. Deficits consistent with post op pain and weakness.     Cervical / Trunk Assessment Cervical / Trunk Assessment: Normal  Communication   Communication: No difficulties  Cognition Arousal/Alertness: Awake/alert Behavior During Therapy: WFL for tasks assessed/performed Overall Cognitive Status: Within Functional Limits for tasks assessed                                        General Comments General comments (skin integrity, edema, etc.): Pt's husband present during session.     Exercises Total Joint Exercises Ankle Circles/Pumps: AROM;Both;20 reps Quad Sets: AROM;Left;10 reps Towel Squeeze: AROM;Both;10 reps Heel Slides: (verbally reviewed; pt practiced X3)   Assessment/Plan    PT Assessment Patient needs continued PT services  PT Problem List Decreased strength;Decreased range of motion;Decreased activity tolerance;Decreased balance;Decreased mobility;Decreased coordination;Decreased knowledge of use of DME;Decreased knowledge of precautions;Pain;Impaired sensation       PT Treatment Interventions DME instruction;Gait training;Functional mobility training;Stair training;Therapeutic exercise;Therapeutic activities;Balance training;Neuromuscular re-education;Patient/family education    PT Goals (Current goals can be found in the Care Plan section)  Acute Rehab PT Goals Patient Stated Goal: to go home  PT Goal Formulation: With patient Time For Goal Achievement: 07/15/17 Potential to Achieve Goals: Good    Frequency 7X/week   Barriers to discharge        Co-evaluation               AM-PAC PT "6 Clicks" Daily Activity  Outcome Measure Difficulty turning over in bed (including adjusting bedclothes, sheets and blankets)?: A Little Difficulty moving from lying on back to sitting on the side of the bed? : Unable Difficulty sitting down on and standing up from a chair with arms (e.g., wheelchair, bedside commode, etc,.)?: Unable Help needed  moving to and from a bed to chair (including a wheelchair)?: A Little Help needed walking in hospital room?: A Little Help needed climbing 3-5 steps with a railing? : A Lot 6 Click Score: 13    End of Session Equipment Utilized During Treatment: Gait belt;Right knee immobilizer Activity Tolerance: Treatment limited secondary to medical complications (Comment)(wooziness ) Patient left: in chair;with call bell/phone within reach;with family/visitor present Nurse Communication: Mobility status PT Visit Diagnosis: Other abnormalities of gait and mobility (R26.89);Pain Pain - Right/Left: Left Pain - part of body: Knee    Time: 6834-1962 PT Time Calculation (min) (ACUTE ONLY): 45 min   Charges:   PT Evaluation $PT Eval Low Complexity: 1 Low PT Treatments $Therapeutic Activity: 23-37 mins   PT G Codes:        Leighton Ruff, PT, DPT  Acute Rehabilitation Services  Pager: [phone]   Rudean Hitt 07/08/2017, 2:47 PM

## 2017-07-09 ENCOUNTER — Encounter (HOSPITAL_COMMUNITY): Payer: Self-pay | Admitting: Orthopedic Surgery

## 2017-07-09 DIAGNOSIS — I10 Essential (primary) hypertension: Secondary | ICD-10-CM | POA: Diagnosis not present

## 2017-07-09 DIAGNOSIS — M1712 Unilateral primary osteoarthritis, left knee: Secondary | ICD-10-CM | POA: Diagnosis not present

## 2017-07-09 DIAGNOSIS — Z79899 Other long term (current) drug therapy: Secondary | ICD-10-CM | POA: Diagnosis not present

## 2017-07-09 DIAGNOSIS — K219 Gastro-esophageal reflux disease without esophagitis: Secondary | ICD-10-CM | POA: Diagnosis not present

## 2017-07-09 DIAGNOSIS — Z87891 Personal history of nicotine dependence: Secondary | ICD-10-CM | POA: Diagnosis not present

## 2017-07-09 DIAGNOSIS — Z7982 Long term (current) use of aspirin: Secondary | ICD-10-CM | POA: Diagnosis not present

## 2017-07-09 NOTE — Discharge Summary (Signed)
Discharge Summary  Patient ID: Laura Carter MRN: [id number] DOB/AGE: 1940-12-13 77 y.o.  Admit date: 07/08/2017 Discharge date: 07/09/2017  Admission Diagnoses:  Primary osteoarthritis of left knee  Discharge Diagnoses:  Principal Problem:   Primary osteoarthritis of left knee Active Problems:   Diastolic dysfunction   GERD (gastroesophageal reflux disease)   Primary osteoarthritis of knee   Past Medical History:  Diagnosis Date  . Asthma   . Diastolic dysfunction   . GERD (gastroesophageal reflux disease)   . History of hiatal hernia   . Hypertension   . Osteoarthritis of left knee     Surgeries: Procedure(s): UNICOMPARTMENTAL KNEE on 07/08/2017   Consultants (if any):   Discharged Condition: Improved  Hospital Course: Laura Carter is an 77 y.o. female who was admitted 07/08/2017 with a diagnosis of Primary osteoarthritis of left knee and went to the operating room on 07/08/2017 and underwent the above named procedures.    She was given perioperative antibiotics:  Anti-infectives (From admission, onward)   Start     Dose/Rate Route Frequency Ordered Stop   07/08/17 1130  ceFAZolin (ANCEF) IVPB 1 g/50 mL premix     1 g 100 mL/hr over 30 Minutes Intravenous Every 6 hours 07/08/17 1119 07/08/17 1839   07/08/17 0607  ceFAZolin (ANCEF) IVPB 2g/100 mL premix     2 g 200 mL/hr over 30 Minutes Intravenous On call to O.R. 07/08/17 6010 07/08/17 9323    .  She was given sequential compression devices, early ambulation, and ASA for DVT prophylaxis.  She benefited maximally from the hospital stay and there were no complications.    Recent vital signs:  Vitals:   07/09/17 0018 07/09/17 0501  BP: 130/60 (!) 141/75  Pulse: 60 60  Resp: 16 16  Temp: 98.4 F (36.9 C) 97.8 F (36.6 C)  SpO2: 95% 95%    Recent laboratory studies:  Lab Results  Component Value Date   HGB 14.2 06/25/2017   HGB 13.7 06/10/2017   HGB 13.6 10/04/2016   Lab Results  Component Value Date    WBC 5.1 06/25/2017   PLT 295 06/25/2017   No results found for: INR Lab Results  Component Value Date   NA 140 06/25/2017   K 4.4 06/25/2017   CL 108 06/25/2017   CO2 23 06/25/2017   BUN 15 06/25/2017   CREATININE 0.86 06/25/2017   GLUCOSE 97 06/25/2017    Discharge Medications:   Allergies as of 07/09/2017   No Known Allergies     Medication List    STOP taking these medications   amoxicillin 500 MG capsule Commonly known as:  AMOXIL     TAKE these medications   acetaminophen 500 MG tablet Commonly known as:  TYLENOL Take 2 tablets (1,000 mg total) by mouth every 8 (eight) hours for 14 days. For Pain.   aspirin EC 325 MG tablet Take 1 tablet (325 mg total) by mouth daily. For 30 days post op for DVT Prophylaxis.  Resume 81 mg Aspirin when completed. What changed:    medication strength  how much to take  when to take this  additional instructions   baclofen 10 MG tablet Commonly known as:  LIORESAL Take 1 tablet (10 mg total) by mouth 3 (three) times daily as needed for muscle spasms.   celecoxib 200 MG capsule Commonly known as:  CELEBREX Take 1 capsule (200 mg total) by mouth 2 (two) times daily. For 2 weeks post op for pain and inflammation.  Discontinue Ibuprofen or other Anti-inflammatory medicine when taking this medicine.   COQ10 PO Take 1 capsule by mouth daily.   docusate sodium 100 MG capsule Commonly known as:  COLACE Take 1 capsule (100 mg total) by mouth 2 (two) times daily. To prevent constipation while taking pain medication.   KRILL OIL PO Take 1 capsule by mouth daily.   Magnesium 500 MG Caps Take 500 mg by mouth at bedtime.   Melatonin 3 MG Tabs Take 3 mg by mouth at bedtime as needed (for sleep.).   metoprolol succinate 25 MG 24 hr tablet Commonly known as:  TOPROL-XL TAKE (1) TABLET BY MOUTH ONCE DAILY.   omeprazole 20 MG tablet Commonly known as:  PRILOSEC OTC Take 20 mg by mouth at bedtime.   ondansetron 4 MG  tablet Commonly known as:  ZOFRAN Take 1 tablet (4 mg total) by mouth every 8 (eight) hours as needed for nausea or vomiting.   oxyCODONE 5 MG immediate release tablet Commonly known as:  ROXICODONE Take 1-2 tablets (5-10 mg total) by mouth every 4 (four) hours as needed for breakthrough pain.   VITAMIN B-12 PO Take 1 tablet by mouth daily.   VITAMIN B-6 PO Take 1 tablet by mouth daily.   VITAMIN D3 PO Take 1 tablet by mouth daily.   ZINC PO Take 1 tablet by mouth daily as needed (for immune health/onset of cold).       Diagnostic Studies: Dg Chest 2 View  Result Date: 06/16/2017 CLINICAL DATA:  PRE OP FOR KNEE SURGERY Jul 08, 2017, YEARLY CHECK UP, COLD SYMPTOMS STARTED TODAY, HTN, FORMER SMOKER SINCE 1970 EXAM: CHEST - 2 VIEW COMPARISON:  07/04/2014 FINDINGS: Linear scarring or subsegmental atelectasis in the lingula as before. No new infiltrate. No overt edema. Heart size and mediastinal contours are within normal limits. Fluid level in small hiatal hernia. No effusion. Visualized bones unremarkable.  Cholecystectomy clips. IMPRESSION: 1.  No acute cardiopulmonary disease. 2. Hiatal hernia. Electronically Signed   By: Lucrezia Europe M.D.   On: 06/16/2017 13:36   Dg Knee Left Port  Result Date: 07/08/2017 CLINICAL DATA:  Status post left knee arthroplasty. EXAM: PORTABLE LEFT KNEE - 1-2 VIEW COMPARISON:  11/20/2015 FINDINGS: Sequelae of interval medial compartment arthroplasty are identified. The prosthetic femoral and tibial components appear normally located. Postoperative gas is noted in the knee joint, and there is likely a small joint effusion. No acute fracture is identified. Mild lateral compartment and patellofemoral compartment osteophytosis is noted. IMPRESSION: Medial compartment arthroplasty without evidence of acute complication. Electronically Signed   By: Logan Bores M.D.   On: 07/08/2017 10:30    Disposition: 01-Home or Self Care  Discharge Instructions    Discharge  patient   Complete by:  As directed    After therapy session this morning.   Discharge disposition:  01-Home or Self Care   Discharge patient date:  07/09/2017      Follow-up Information    Renette Butters, MD Follow up.   Specialty:  Orthopedic Surgery Contact information: [address] [place] [address] [phone]        [facility] Follow up.   Specialty:  [place] Why:  Home Health Physical Therapy- agency will call to arrange initial appointment Contact information: [place] [address] [phone]            Signed: Prudencio Burly III PA-C 07/09/2017, 7:26 AM

## 2017-07-09 NOTE — Progress Notes (Signed)
    Subjective: Patient reports pain as mild.  Tolerating diet.  Urinating.  +Flatus.  No CP, SOB.  OOB w/ therapy.  Objective:   VITALS:   Vitals:   07/08/17 1300 07/08/17 1900 07/09/17 0018 07/09/17 0501  BP: 123/60 137/64 130/60 (!) 141/75  Pulse: 75 61 60 60  Resp: 16 16 16 16   Temp: (!) 95.5 F (35.3 C) 98.5 F (36.9 C) 98.4 F (36.9 C) 97.8 F (36.6 C)  TempSrc: Oral Oral Oral Oral  SpO2: 97% 95% 95% 95%  Weight:      Height:       CBC Latest Ref Rng & Units 06/25/2017 06/10/2017 10/04/2016  WBC 4.0 - 10.5 K/uL 5.1 4.8 4.6  Hemoglobin 12.0 - 15.0 g/dL 14.2 13.7 13.6  Hematocrit 36.0 - 46.0 % 42.7 40.8 41.7  Platelets 150 - 400 K/uL 295 329 311   BMP Latest Ref Rng & Units 06/25/2017 06/10/2017 10/04/2016  Glucose 65 - 99 mg/dL 97 98 83  BUN 6 - 20 mg/dL 15 21 17   Creatinine 0.44 - 1.00 mg/dL 0.86 0.91 0.77  BUN/Creat Ratio 6 - 22 (calc) - NOT APPLICABLE -  Sodium 264 - 145 mmol/L 140 140 139  Potassium 3.5 - 5.1 mmol/L 4.4 4.6 4.7  Chloride 101 - 111 mmol/L 108 104 107  CO2 22 - 32 mmol/L 23 30 22   Calcium 8.9 - 10.3 mg/dL 9.3 9.8 9.1   Intake/Output      02/12 0701 - 02/13 0700 02/13 0701 - 02/14 0700   P.O. 240    I.V. (mL/kg) 1175 (14.1)    IV Piggyback 50    Total Intake(mL/kg) 1465 (17.5)    Urine (mL/kg/hr) 100 (0)    Blood 20    Total Output 120    Net +1345         Urine Occurrence 1 x       Physical Exam: General: NAD.  Upright in bed.  Calm, pleasant, converstant. Resp: No increased wob Cardio: regular rate and rhythm ABD soft Neurologically intact MSK Neurovascularly intact Sensation intact distally Feet warm Dorsiflexion/Plantar flexion intact Incision: dressing C/D/I   Assessment: 1 Day Post-Op  S/P Procedure(s) (LRB): UNICOMPARTMENTAL KNEE (Left) by Dr. Ernesta Amble. Murphy on 07/08/17  Principal Problem:   Primary osteoarthritis of left knee Active Problems:   Diastolic dysfunction   GERD (gastroesophageal reflux disease)  Primary osteoarthritis of knee  S/P Left Unicompartmental Knee Arthroplasty Doing well. Eating, drinking, voiding, mobilizing.  Desiring d/c asap today after therapy.  Plan: Advance diet Up with therapy D/C IV fluids Incentive Spirometry Elevate and Apply ice  Weight Bearing: Weight Bearing as Tolerated (WBAT)  Dressings: Maintain Mepilex.  VTE prophylaxis: Aspirin, SCDs, ambulation Dispo: Home today after therapy   Prudencio Burly III, PA-C 07/09/2017, 7:21 AM

## 2017-07-09 NOTE — Evaluation (Signed)
Occupational Therapy Evaluation Patient Details Name: Laura Carter MRN: [id number] DOB: 10-30-1940 Today's Date: 07/09/2017    History of Present Illness Pt is a 77 y/o female s/p elective L partial knee replacement. PMH inlucdes HTN, asthma, and L knee arthroscopy.    Clinical Impression   This 77 y/o F presents with the above. At baseline Pt is independent with ADLs and functional mobility, using SPC for longer distance ambulation. Pt requiring MinGuard for functional mobility and functional transfers this session at RW level, requires MinA for LB ADLs secondary to LLE functional limitations. Pt will return home with spouse who is able to assist with ADLs PRN. Education provided and questions answered throughout. Feel Pt is safe to return home once medically ready with available spouse assist. No further acute OT needs identified at this time; will sign off.     Follow Up Recommendations  Follow surgeon's recommendation for DC plan and follow-up therapies;Supervision/Assistance - 24 hour    Equipment Recommendations  None recommended by OT           Precautions / Restrictions Precautions Precautions: Knee Precaution Booklet Issued: Yes (comment) Precaution Comments: precautions/positioning reviewed with pt Restrictions Weight Bearing Restrictions: Yes LLE Weight Bearing: Weight bearing as tolerated      Mobility Bed Mobility Overal bed mobility: Needs Assistance Bed Mobility: Supine to Sit     Supine to sit: Min guard;HOB elevated     General bed mobility comments: minguard for safety; HOB elevated   Transfers Overall transfer level: Needs assistance Equipment used: Rolling walker (2 wheeled) Transfers: Sit to/from [facility] Sit to Stand: Min guard Stand pivot transfers: Min guard       General transfer comment: min guard for safety; cues for safe hand placement    Balance Overall balance assessment: Needs assistance Sitting-balance support:  No upper extremity supported;Feet supported Sitting balance-Leahy Scale: Good     Standing balance support: Bilateral upper extremity supported;During functional activity Standing balance-Leahy Scale: Fair Standing balance comment: able to maintain static standing without UE support and close minguard; reliant on UE support during mobility                            ADL either performed or assessed with clinical judgement   ADL Overall ADL's : Needs assistance/impaired Eating/Feeding: Modified independent;Sitting   Grooming: Set up;Sitting   Upper Body Bathing: Min guard;Sitting   Lower Body Bathing: Minimal assistance;Sit to/from stand   Upper Body Dressing : Set up;Sitting   Lower Body Dressing: Sit to/from stand;Minimal assistance Lower Body Dressing Details (indicate cue type and reason): educated on compensatory techinques for task completion  Toilet Transfer: Min guard;Stand-pivot;BSC;RW   Toileting- Water quality scientist and Hygiene: Min guard;Sit to/from stand Toileting - Clothing Manipulation Details (indicate cue type and reason): close guard for safety while Pt completes clothing management and peri care after voiding bladder  Tub/ Shower Transfer: Walk-in shower;Min guard;Ambulation;3 in 1;Rolling walker Tub/Shower Transfer Details (indicate cue type and reason): simulated in room; pt with good recall of technique; educated to wash up seated in front of sink until medically cleared to shower  Functional mobility during ADLs: Min guard;Rolling walker General ADL Comments: Pt completing in-room mobility, toileting, and simulated shower transfer; education provided on safety and compensatory techniques for completing ADLs and functional transfers  Pertinent Vitals/Pain Pain Assessment: Faces Pain Score: 4  Faces Pain Scale: Hurts little more Pain Location: L knee  Pain Descriptors / Indicators: Discomfort;Sore;Tightness Pain  Intervention(s): Limited activity within patient's tolerance;Monitored during session;Ice applied     Hand Dominance Right   Extremity/Trunk Assessment Upper Extremity Assessment Upper Extremity Assessment: Overall WFL for tasks assessed   Lower Extremity Assessment Lower Extremity Assessment: Defer to PT evaluation   Cervical / Trunk Assessment Cervical / Trunk Assessment: Normal   Communication Communication Communication: No difficulties   Cognition Arousal/Alertness: Awake/alert Behavior During Therapy: WFL for tasks assessed/performed Overall Cognitive Status: Within Functional Limits for tasks assessed                                                     Home Living Family/patient expects to be discharged to:: Private residence Living Arrangements: Spouse/significant other Available Help at Discharge: Family;Available 24 hours/day Type of Home: House Home Access: Stairs to enter [employer] of Steps: 3 Entrance Stairs-Rails: None Home Layout: Multi-level(has an Media planner )     Bathroom Shower/Tub: Occupational psychologist: Standard     Home Equipment: Toilet riser;Bedside commode;Walker - 2 wheels;Cane - single point          Prior Functioning/Environment Level of Independence: Independent with assistive device(s)        Comments: Used cane for longer distances, otherwise independent.         OT Problem List: Decreased strength;Decreased activity tolerance;Decreased range of motion;Decreased knowledge of use of DME or AE            OT Goals(Current goals can be found in the care plan section) Acute Rehab OT Goals Patient Stated Goal: to go home  OT Goal Formulation: All assessment and education complete, DC therapy                                 AM-PAC PT "6 Clicks" Daily Activity     Outcome Measure Help from another person eating meals?: None Help from another person taking care of personal  grooming?: A Little Help from another person toileting, which includes using toliet, bedpan, or urinal?: A Little Help from another person bathing (including washing, rinsing, drying)?: A Little Help from another person to put on and taking off regular upper body clothing?: None Help from another person to put on and taking off regular lower body clothing?: A Little 6 Click Score: 20   End of Session Equipment Utilized During Treatment: Gait belt;Rolling walker Nurse Communication: Mobility status  Activity Tolerance: Patient tolerated treatment well Patient left: in chair;with call bell/phone within reach  OT Visit Diagnosis: Other abnormalities of gait and mobility (R26.89)                Time: 1287-8676 OT Time Calculation (min): 38 min Charges:  OT General Charges $OT Visit: 1 Visit OT Evaluation $OT Eval Low Complexity: 1 Low OT Treatments $Self Care/Home Management : 8-22 mins G-Codes:     Lou Cal, OT Pager [phone] 07/09/2017   Raymondo Band 07/09/2017, 1:28 PM

## 2017-07-09 NOTE — Anesthesia Postprocedure Evaluation (Signed)
Anesthesia Post Note  Patient: Laura Carter  Procedure(s) Performed: UNICOMPARTMENTAL KNEE (Left Knee)     Patient location during evaluation: PACU Anesthesia Type: Spinal Level of consciousness: awake Pain management: satisfactory to patient Vital Signs Assessment: post-procedure vital signs reviewed and stable Respiratory status: spontaneous breathing Cardiovascular status: blood pressure returned to baseline Postop Assessment: no headache and spinal receding Anesthetic complications: no    Last Vitals:  Vitals:   07/09/17 0018 07/09/17 0501  BP: 130/60 (!) 141/75  Pulse: 60 60  Resp: 16 16  Temp: 36.9 C 36.6 C  SpO2: 95% 95%    Last Pain:  Vitals:   07/09/17 0501  TempSrc: Oral  PainSc:    Pain Goal: Patients Stated Pain Goal: 3 (07/08/17 1610)               Riccardo Dubin

## 2017-07-09 NOTE — Progress Notes (Signed)
Physical Therapy Treatment Patient Details Name: Laura Carter MRN: [id number] DOB: 1941/04/12 Today's Date: 07/09/2017    History of Present Illness Pt is a 77 y/o female s/p elective L partial knee replacement. PMH inlucdes HTN, asthma, and L knee arthroscopy.     PT Comments    Patient is making good progress with PT.  From a mobility standpoint anticipate patient will be ready for DC home when medically ready.    Follow Up Recommendations  Follow surgeon's recommendation for DC plan and follow-up therapies;Supervision for mobility/OOB     Equipment Recommendations  None recommended by PT    Recommendations for Other Services       Precautions / Restrictions Precautions Precautions: Knee Precaution Booklet Issued: Yes (comment) Precaution Comments: precautions/positioning reviewed with pt Restrictions Weight Bearing Restrictions: Yes LLE Weight Bearing: Weight bearing as tolerated    Mobility  Bed Mobility               General bed mobility comments: pt OOB in chair upon arrival  Transfers Overall transfer level: Needs assistance Equipment used: Rolling walker (2 wheeled) Transfers: Sit to/from [facility] Sit to Stand: Min guard         General transfer comment: min guard for safety; cues for safe hand placement  Ambulation/Gait Ambulation/Gait assistance: Supervision Ambulation Distance (Feet): 200 Feet Assistive device: Rolling walker (2 wheeled) Gait Pattern/deviations: Step-to pattern;Decreased weight shift to left;Antalgic;Step-through pattern Gait velocity: Decreased    General Gait Details: mildly antalgic gait pattern; cues for posture and sequencing   Stairs Stairs: Yes   Stair Management: No rails;Backwards;With walker;One rail Right;Step to pattern;Forwards Number of Stairs: (3 steps backwards with RW and 10 steps forward with rail) General stair comments: min A to stabilize RW with husband assisting; cues for  sequencing and technique  Wheelchair Mobility    Modified Rankin (Stroke Patients Only)       Balance Overall balance assessment: Needs assistance Sitting-balance support: No upper extremity supported;Feet supported Sitting balance-Leahy Scale: Good     Standing balance support: Bilateral upper extremity supported;During functional activity Standing balance-Leahy Scale: Poor                              Cognition Arousal/Alertness: Awake/alert Behavior During Therapy: WFL for tasks assessed/performed Overall Cognitive Status: Within Functional Limits for tasks assessed                                        Exercises Total Joint Exercises Ankle Circles/Pumps: AROM;Both;10 reps Quad Sets: AROM;Left;10 reps Short Arc Quad: AROM;Left;10 reps Heel Slides: AROM;Left;10 reps Long Arc Quad: AROM;Left;10 reps;Seated Knee Flexion: AROM;Left;10 reps;Seated(10 sec holds) Goniometric ROM: 0-95    General Comments        Pertinent Vitals/Pain Pain Assessment: 0-10 Pain Score: 4  Pain Location: L knee  Pain Descriptors / Indicators: Discomfort;Sore;Tightness Pain Intervention(s): Limited activity within patient's tolerance;Monitored during session;Premedicated before session;Repositioned;Ice applied    Home Living                      Prior Function            PT Goals (current goals can now be found in the care plan section) Acute Rehab PT Goals Patient Stated Goal: to go home  PT Goal Formulation: With patient Time For Goal Achievement: 07/15/17  Potential to Achieve Goals: Good Progress towards PT goals: Progressing toward goals    Frequency    7X/week      PT Plan Current plan remains appropriate    Co-evaluation              AM-PAC PT "6 Clicks" Daily Activity  Outcome Measure  Difficulty turning over in bed (including adjusting bedclothes, sheets and blankets)?: A Little Difficulty moving from lying on back  to sitting on the side of the bed? : A Little Difficulty sitting down on and standing up from a chair with arms (e.g., wheelchair, bedside commode, etc,.)?: A Little Help needed moving to and from a bed to chair (including a wheelchair)?: A Little Help needed walking in hospital room?: A Little Help needed climbing 3-5 steps with a railing? : A Little 6 Click Score: 18    End of Session Equipment Utilized During Treatment: Gait belt Activity Tolerance: Patient tolerated treatment well Patient left: in chair;with call bell/phone within reach;with family/visitor present Nurse Communication: Mobility status PT Visit Diagnosis: Other abnormalities of gait and mobility (R26.89);Pain Pain - Right/Left: Left Pain - part of body: Knee     Time: 1046-1130 PT Time Calculation (min) (ACUTE ONLY): 44 min  Charges:  $Gait Training: 23-37 mins $Therapeutic Exercise: 8-22 mins                    G Codes:       Earney Navy, PTA Pager: [phone]     Darliss Cheney 07/09/2017, 11:40 AM

## 2017-07-09 NOTE — Progress Notes (Signed)
Patient for discharge home accompanied by her spouse with no apparent distress noted. Medications and discharge instructions explained to the patient. She verbalized understanding. Copies given to the patient including original prescription. IV saline lock removed.

## 2017-07-10 DIAGNOSIS — Z87891 Personal history of nicotine dependence: Secondary | ICD-10-CM | POA: Diagnosis not present

## 2017-07-10 DIAGNOSIS — J45909 Unspecified asthma, uncomplicated: Secondary | ICD-10-CM | POA: Diagnosis not present

## 2017-07-10 DIAGNOSIS — Z9181 History of falling: Secondary | ICD-10-CM | POA: Diagnosis not present

## 2017-07-10 DIAGNOSIS — I119 Hypertensive heart disease without heart failure: Secondary | ICD-10-CM | POA: Diagnosis not present

## 2017-07-10 DIAGNOSIS — Z471 Aftercare following joint replacement surgery: Secondary | ICD-10-CM | POA: Diagnosis not present

## 2017-07-10 DIAGNOSIS — Z96652 Presence of left artificial knee joint: Secondary | ICD-10-CM | POA: Diagnosis not present

## 2017-07-12 DIAGNOSIS — Z471 Aftercare following joint replacement surgery: Secondary | ICD-10-CM | POA: Diagnosis not present

## 2017-07-12 DIAGNOSIS — Z9181 History of falling: Secondary | ICD-10-CM | POA: Diagnosis not present

## 2017-07-12 DIAGNOSIS — J45909 Unspecified asthma, uncomplicated: Secondary | ICD-10-CM | POA: Diagnosis not present

## 2017-07-12 DIAGNOSIS — I119 Hypertensive heart disease without heart failure: Secondary | ICD-10-CM | POA: Diagnosis not present

## 2017-07-12 DIAGNOSIS — Z96652 Presence of left artificial knee joint: Secondary | ICD-10-CM | POA: Diagnosis not present

## 2017-07-12 DIAGNOSIS — Z87891 Personal history of nicotine dependence: Secondary | ICD-10-CM | POA: Diagnosis not present

## 2017-07-14 DIAGNOSIS — Z9181 History of falling: Secondary | ICD-10-CM | POA: Diagnosis not present

## 2017-07-14 DIAGNOSIS — Z471 Aftercare following joint replacement surgery: Secondary | ICD-10-CM | POA: Diagnosis not present

## 2017-07-14 DIAGNOSIS — J45909 Unspecified asthma, uncomplicated: Secondary | ICD-10-CM | POA: Diagnosis not present

## 2017-07-14 DIAGNOSIS — Z96652 Presence of left artificial knee joint: Secondary | ICD-10-CM | POA: Diagnosis not present

## 2017-07-14 DIAGNOSIS — I119 Hypertensive heart disease without heart failure: Secondary | ICD-10-CM | POA: Diagnosis not present

## 2017-07-14 DIAGNOSIS — Z87891 Personal history of nicotine dependence: Secondary | ICD-10-CM | POA: Diagnosis not present

## 2017-07-16 ENCOUNTER — Telehealth: Payer: Self-pay | Admitting: Family Medicine

## 2017-07-16 DIAGNOSIS — J45909 Unspecified asthma, uncomplicated: Secondary | ICD-10-CM | POA: Diagnosis not present

## 2017-07-16 DIAGNOSIS — Z9181 History of falling: Secondary | ICD-10-CM | POA: Diagnosis not present

## 2017-07-16 DIAGNOSIS — Z87891 Personal history of nicotine dependence: Secondary | ICD-10-CM | POA: Diagnosis not present

## 2017-07-16 DIAGNOSIS — E2839 Other primary ovarian failure: Secondary | ICD-10-CM

## 2017-07-16 DIAGNOSIS — Z96652 Presence of left artificial knee joint: Secondary | ICD-10-CM | POA: Diagnosis not present

## 2017-07-16 DIAGNOSIS — Z471 Aftercare following joint replacement surgery: Secondary | ICD-10-CM | POA: Diagnosis not present

## 2017-07-16 DIAGNOSIS — I119 Hypertensive heart disease without heart failure: Secondary | ICD-10-CM | POA: Diagnosis not present

## 2017-07-16 NOTE — Telephone Encounter (Signed)
Laura Carter with Laura Carter is calling about Laura Carter DX needs to be changed

## 2017-07-17 DIAGNOSIS — Z96652 Presence of left artificial knee joint: Secondary | ICD-10-CM | POA: Diagnosis not present

## 2017-07-17 DIAGNOSIS — Z87891 Personal history of nicotine dependence: Secondary | ICD-10-CM | POA: Diagnosis not present

## 2017-07-17 DIAGNOSIS — Z471 Aftercare following joint replacement surgery: Secondary | ICD-10-CM | POA: Diagnosis not present

## 2017-07-17 DIAGNOSIS — J45909 Unspecified asthma, uncomplicated: Secondary | ICD-10-CM | POA: Diagnosis not present

## 2017-07-17 DIAGNOSIS — I119 Hypertensive heart disease without heart failure: Secondary | ICD-10-CM | POA: Diagnosis not present

## 2017-07-17 DIAGNOSIS — Z9181 History of falling: Secondary | ICD-10-CM | POA: Diagnosis not present

## 2017-07-17 NOTE — Telephone Encounter (Signed)
Because of patient's age, her Medicare will not cover post-menopausal as her diagnosis for the bone density. They will cover estrogen deficiency, and/or osteopenia. Please advise which, if either diagnosis, I can use and I will change referral accordingly.

## 2017-07-17 NOTE — Telephone Encounter (Signed)
Estrogen deficiency Laura Her. Mannie Stabile, MD

## 2017-07-18 NOTE — Addendum Note (Signed)
Addended by: Merceda Elks on: 07/18/2017 01:06 PM   Modules accepted: Orders

## 2017-07-21 DIAGNOSIS — M1712 Unilateral primary osteoarthritis, left knee: Secondary | ICD-10-CM | POA: Diagnosis not present

## 2017-07-22 DIAGNOSIS — J45909 Unspecified asthma, uncomplicated: Secondary | ICD-10-CM | POA: Diagnosis not present

## 2017-07-22 DIAGNOSIS — Z96652 Presence of left artificial knee joint: Secondary | ICD-10-CM | POA: Diagnosis not present

## 2017-07-22 DIAGNOSIS — Z9181 History of falling: Secondary | ICD-10-CM | POA: Diagnosis not present

## 2017-07-22 DIAGNOSIS — Z87891 Personal history of nicotine dependence: Secondary | ICD-10-CM | POA: Diagnosis not present

## 2017-07-22 DIAGNOSIS — I119 Hypertensive heart disease without heart failure: Secondary | ICD-10-CM | POA: Diagnosis not present

## 2017-07-22 DIAGNOSIS — Z471 Aftercare following joint replacement surgery: Secondary | ICD-10-CM | POA: Diagnosis not present

## 2017-07-23 ENCOUNTER — Other Ambulatory Visit: Payer: Self-pay | Admitting: Family Medicine

## 2017-07-23 DIAGNOSIS — Z1231 Encounter for screening mammogram for malignant neoplasm of breast: Secondary | ICD-10-CM

## 2017-07-24 DIAGNOSIS — Z96652 Presence of left artificial knee joint: Secondary | ICD-10-CM | POA: Diagnosis not present

## 2017-07-24 DIAGNOSIS — Z87891 Personal history of nicotine dependence: Secondary | ICD-10-CM | POA: Diagnosis not present

## 2017-07-24 DIAGNOSIS — Z9181 History of falling: Secondary | ICD-10-CM | POA: Diagnosis not present

## 2017-07-24 DIAGNOSIS — Z471 Aftercare following joint replacement surgery: Secondary | ICD-10-CM | POA: Diagnosis not present

## 2017-07-24 DIAGNOSIS — I119 Hypertensive heart disease without heart failure: Secondary | ICD-10-CM | POA: Diagnosis not present

## 2017-07-24 DIAGNOSIS — J45909 Unspecified asthma, uncomplicated: Secondary | ICD-10-CM | POA: Diagnosis not present

## 2017-07-29 ENCOUNTER — Encounter (HOSPITAL_COMMUNITY): Payer: Self-pay | Admitting: Physical Therapy

## 2017-07-29 ENCOUNTER — Other Ambulatory Visit: Payer: Self-pay

## 2017-07-29 ENCOUNTER — Ambulatory Visit (HOSPITAL_COMMUNITY): Payer: Medicare Other | Attending: Orthopedic Surgery | Admitting: Physical Therapy

## 2017-07-29 DIAGNOSIS — R2681 Unsteadiness on feet: Secondary | ICD-10-CM | POA: Diagnosis not present

## 2017-07-29 DIAGNOSIS — M25662 Stiffness of left knee, not elsewhere classified: Secondary | ICD-10-CM | POA: Insufficient documentation

## 2017-07-29 DIAGNOSIS — M25562 Pain in left knee: Secondary | ICD-10-CM | POA: Diagnosis not present

## 2017-07-29 DIAGNOSIS — G8929 Other chronic pain: Secondary | ICD-10-CM | POA: Insufficient documentation

## 2017-07-29 DIAGNOSIS — R262 Difficulty in walking, not elsewhere classified: Secondary | ICD-10-CM | POA: Diagnosis not present

## 2017-07-29 DIAGNOSIS — R2242 Localized swelling, mass and lump, left lower limb: Secondary | ICD-10-CM | POA: Insufficient documentation

## 2017-07-29 NOTE — Patient Instructions (Addendum)
Heel Raise: Bilateral (Standing)    Rise on balls of feet. Repeat __15__ times per set. Do _1___ sets per session. Do _2___ sessions per day.  http://orth.exer.us/38   Copyright  VHI. All rights reserved.  Balance: Unilateral   At your kitchen counter Attempt to balance on left leg, eyes open. Hold 20-60____ seconds. Repeat __5__ times per set. Do _1___ sets per session. Do __2__ sessions per day. Perform exercise with eyes closed.  http://orth.exer.us/28   Copyright  VHI. All rights reserved.  Functional Quadriceps: Chair Squat    Keeping feet flat on floor, shoulder width apart, squat as low as is comfortable. Use support as necessary. Repeat __15__ times per set. Do _1___ sets per session. Do _2___ sessions per day.  http://orth.exer.us/736   Copyright  VHI. All rights reserved.  Strengthening: Hip Abduction (Side-Lying)    Tighten muscles on front of left thigh, then lift leg __15__ inches from surface, keeping knee locked.  Repeat ___15_ times per set. Do __1__ sets per session. Do ___2_ sessions per day.  http://orth.exer.us/622   Copyright  VHI. All rights reserved.  Strengthening: Hip Extension (Prone)    Tighten muscles on front of left thigh, then lift leg __3__ inches from surface, keeping knee locked. Repeat 10____ times per set. Do _1___ sets per session. Do __2__ sessions per day.  http://orth.exer.us/620   Copyright  VHI. All rights reserved.  Strengthening: Quadriceps Set    Tighten muscles on top of thighs by pushing knees down into surface. Hold __5__ seconds. Repeat 10____ times per set. Do __1__ sets per session. Do _2___ sessions per day.  http://orth.exer.us/602   Copyright  VHI. All rights reserved.  Self-Mobilization: Heel Slide (Supine)    Slide left heel toward buttocks until a gentle stretch is felt. Hold _3___ seconds. Relax. Repeat _5___ times per set. Do __1__ sets per session. Do __2__ sessions per  day.  http://orth.exer.us/710   Copyright  VHI. All rights reserved.

## 2017-07-29 NOTE — Therapy (Signed)
[place], [place], [address] Phone: [phone]   Fax:  [phone]  Physical Therapy Evaluation  Patient Details  Name: Laura Carter MRN: [id number] Date of Birth: [birth year] Referring Provider: Edmonia Lynch   Encounter Date: 07/29/2017  PT End of Session - 07/29/17 1431    Visit Number  1    Number of Visits  8    Date for PT Re-Evaluation  08/28/17    Authorization - Visit Number  1    Authorization - Number of Visits  8    PT Start Time  1350    PT Stop Time  1425    PT Time Calculation (min)  35 min       Past Medical History:  Diagnosis Date  . Asthma   . Diastolic dysfunction   . GERD (gastroesophageal reflux disease)   . History of hiatal hernia   . Hypertension   . Osteoarthritis of left knee     Past Surgical History:  Procedure Laterality Date  . CHOLECYSTECTOMY  10/10  . COLONOSCOPY    . ESOPHAGOGASTRODUODENOSCOPY    . EYE SURGERY     cataract right  . KNEE ARTHROSCOPY WITH MEDIAL MENISECTOMY Left 06/16/2015   Procedure: KNEE ARTHROSCOPY WITH PARTIAL MEDIAL MENISECTOMY;  Surgeon: Carole Civil, MD;  Location: [facility];  Service: Orthopedics;  Laterality: Left;  . PARTIAL KNEE ARTHROPLASTY Left 07/08/2017   Procedure: UNICOMPARTMENTAL KNEE;  Surgeon: Renette Butters, MD;  Location: [facility];  Service: Orthopedics;  Laterality: Left;    There were no vitals filed for this visit.   Subjective Assessment - 07/29/17 1345    Subjective  [name] states that she had chronic Lt knee pain and opted to have a uniarthroplasty completed on 07/08/2017.  She states that at this time she is having minimal pain.      Pertinent History  OA    How long can you sit comfortably?  feels stiff after sitting for an hour     How long can you stand comfortably?  20 minutes     How long can you walk comfortably?  walking with a cane for 20    Patient Stated Goals  Walk without a limp, negotiating steps  better, improved bending     Currently in Pain?  Yes    Pain Score  2     Pain Location  Knee    Pain Orientation  Left    Pain Descriptors / Indicators  Discomfort;Tender    Pain Type  Acute pain    Pain Onset  1 to 4 weeks ago    Pain Frequency  Intermittent    Aggravating Factors   too much activity.    Pain Relieving Factors  not needed     Effect of Pain on Daily Activities  no problem          [facility] PT Assessment - 07/29/17 0001      Assessment   Medical Diagnosis  Lt knee pain    Referring Provider  Edmonia Lynch    Onset Date/Surgical Date  06/15/16    Next MD Visit  08/18/2017    Prior Therapy  PT, chiropractor, acupunture, yoga, massage therapy       Precautions   Precautions  None      Restrictions   Weight Bearing Restrictions  No      Balance Screen   Has the patient fallen in the past 6  months  No    Has the patient had a decrease in activity level because of a fear of falling?   No    Is the patient reluctant to leave their home because of a fear of falling?   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      Prior Function   Level of [place]  Retired    Leisure  yoga, walking       Cognition   Overall Cognitive Status  Within Functional Limits for tasks assessed      Observation/Other Assessments   Focus on Therapeutic Outcomes (FOTO)   45      Functional Tests   Functional tests  Sit to Stand;Single leg stance      Single Leg Stance   Comments  RT:  13  LT: 8      Sit to Stand   Comments  5 x 18.05       AROM   Left Knee Extension  4    Left Knee Flexion  120      Strength   Left Hip Flexion  5/5    Left Hip Extension  4-/5    Left Hip ABduction  4/5    Left Knee Flexion  5/5    Left Knee Extension  5/5    Left Ankle Dorsiflexion  5/5      6 minute walk test results    Aerobic Endurance Distance Walked  406    Endurance  additional comments  48mnute walk test decreased arm swing and flat foot.              Objective measurements completed on examination: See above findings.      [facility] PT Treatment/Exercise - 07/29/17 0001      Exercises   Exercises  Knee/Hip      Knee/Hip Exercises: Standing   SLS  x3      Knee/Hip Exercises: Supine   Quad Sets  5 reps    Heel Slides  5 reps      Knee/Hip Exercises: Sidelying   Hip ABduction  10 reps      Knee/Hip Exercises: Prone   Hip Extension  10 reps             PT Education - 07/29/17 1431    Education provided  Yes    Education Details  HEP     Person(s) Educated  Patient    Methods  Explanation;Handout    Comprehension  Verbalized understanding       PT Short Term Goals - 07/29/17 1438      PT SHORT TERM GOAL #1   Title  Pt LT knee ROM to be to 125 degrees to be able to sit without increased Left  knee pain for up to 45 minutes to be able to enjoy a meal at a restaurant.     Time  2    Period  Weeks    Status  New    Target Date  08/12/17      PT SHORT TERM GOAL #2   Title  Pt to be able to stand for 30 mintues without increased Left knee pain to cook a small meal     Time  2    Period  Weeks    Status  New  PT SHORT TERM GOAL #3   Title  Pt to be  able to walk for 30 mintues without increased left knee pain to be able to perform shopping activities.     Time  2    Period  Weeks    Status  New      PT SHORT TERM GOAL #4   Title  Pt to be ambulating without an assistive device in her home.     Time  2    Period  Weeks    Status  New      PT SHORT TERM GOAL #5   Title  Pt to be able to single leg stance for 15 seconds on both  leg to allow pt to feel confident walking on unevern ground.     Time  2    Period  Weeks    Status  Partially Met        PT Long Term Goals - 07/29/17 1450      PT LONG TERM GOAL #1   Title  Pt to be able to sit for 60 minutes without increased left knee pain for traveling.      Time  4    Period  Weeks    Status  New    Target Date  08/26/17      PT LONG TERM GOAL #2   Title  Pt Lt LE to be 5/5 to allow pt to go up and down steps in a reciprocal manner    Time  4    Period  Weeks    Status  New      PT LONG TERM GOAL #3   Title  Pt to be able to walk  for 60 minutes without increased left knee pain to allow pt to shop in comfort     Time  4    Period  Weeks    Status  New      PT LONG TERM GOAL #4   Title  PT Lt knee extension to be 0 to allow  a normalized heel toe gait patten and be able to walk 450 ft in 3 minutes.     Time  4    Period  Weeks    Status  New      PT LONG TERM GOAL #5   Title  Pt to single leg stance for 30 seconds bilaterally to reduce risk of falling.     Time  4    Period  Weeks    Status  New             Plan - 07/29/17 1432    Clinical Impression Statement  Ms Campton has had chronic pain in her left knee for over a year.  She ultimately had a partial knee replacement on 07/08/2017.  She has been being seen and has been released from home health therapy.  She is now being referred to skilled out patient therapy to maximize her functional ability.  Evaluation demonstrates decreased gait cadance,, decreased ROM, deceased activity tolerance, decreased balance and increased pain.  Ms. Zamarripa will benefit from skilled outpatient therapy to address these issues and maximize her functional ability     History and Personal Factors relevant to plan of care:  HTN, OA    Clinical Presentation  Stable    Clinical Decision Making  Low    Rehab Potential  Good    PT Frequency  2x / week    PT Duration  4 weeks    PT Treatment/Interventions  ADLs/Self Care Home Management;Iontophoresis 1m/ml Dexamethasone;Ultrasound;Gait training;Functional mobility training;Therapeutic activities;Therapeutic exercise;Balance training;Patient/family education;Manual techniques;Other (comment);Stair training LASER    PT Next Visit Plan  begin vector  stance, sit to stand, slant board stretch, knee flexion on 12" step for ROM, step ups and lunging     PT Home Exercise Plan  EVAL:  ankle dorsiflexion, quad set, heelslide and functional minisquats     Consulted and Agree with Plan of Care  Patient       Patient will benefit from skilled therapeutic intervention in order to improve the following deficits and impairments:  Decreased activity tolerance, Abnormal gait, Decreased balance, Decreased range of motion, Decreased strength, Difficulty walking, Pain  Visit Diagnosis: Difficulty in walking, not elsewhere classified - Plan: PT plan of care cert/re-cert  Chronic pain of left knee - Plan: PT plan of care cert/re-cert     Problem List Patient Active Problem List   Diagnosis Date Noted  . Primary osteoarthritis of knee 07/08/2017  . GERD (gastroesophageal reflux disease) 06/16/2017  . Medial meniscus, posterior horn derangement   . Arthritis of knee   . Primary osteoarthritis of left knee 04/05/2014  . Diastolic dysfunction     CRayetta Humphrey PVirginiaCLT [phone]/09/2017, 2:58 PM  [place] [address]Phone: [phone]  Fax:  [phone] Name: MNELISSA BOLDUCMRN: [id number] of Birth: 405-04-1941

## 2017-07-31 ENCOUNTER — Ambulatory Visit (HOSPITAL_COMMUNITY): Payer: Medicare Other | Admitting: Physical Therapy

## 2017-07-31 ENCOUNTER — Other Ambulatory Visit: Payer: Self-pay

## 2017-07-31 DIAGNOSIS — M25562 Pain in left knee: Secondary | ICD-10-CM | POA: Diagnosis not present

## 2017-07-31 DIAGNOSIS — G8929 Other chronic pain: Secondary | ICD-10-CM | POA: Diagnosis not present

## 2017-07-31 DIAGNOSIS — M25662 Stiffness of left knee, not elsewhere classified: Secondary | ICD-10-CM | POA: Diagnosis not present

## 2017-07-31 DIAGNOSIS — R2242 Localized swelling, mass and lump, left lower limb: Secondary | ICD-10-CM | POA: Diagnosis not present

## 2017-07-31 DIAGNOSIS — R262 Difficulty in walking, not elsewhere classified: Secondary | ICD-10-CM | POA: Diagnosis not present

## 2017-07-31 DIAGNOSIS — R2681 Unsteadiness on feet: Secondary | ICD-10-CM

## 2017-07-31 NOTE — Therapy (Signed)
[facility], [place], [address] Phone: [phone]   Fax:  [phone]  Physical Therapy Treatment  Patient Details  Name: Laura Carter MRN: [id number] Date of Birth: 1941-03-08 Referring Provider: Edmonia Lynch   Encounter Date: 07/31/2017  PT End of Session - 07/31/17 1427    Visit Number  2    Number of Visits  8    Date for PT Re-Evaluation  08/28/17    Authorization - Visit Number  2    Authorization - Number of Visits  8    PT Start Time  4580    PT Stop Time  1427    PT Time Calculation (min)  39 min       Past Medical History:  Diagnosis Date  . Asthma   . Diastolic dysfunction   . GERD (gastroesophageal reflux disease)   . History of hiatal hernia   . Hypertension   . Osteoarthritis of left knee     Past Surgical History:  Procedure Laterality Date  . CHOLECYSTECTOMY  10/10  . COLONOSCOPY    . ESOPHAGOGASTRODUODENOSCOPY    . EYE SURGERY     cataract right  . KNEE ARTHROSCOPY WITH MEDIAL MENISECTOMY Left 06/16/2015   Procedure: KNEE ARTHROSCOPY WITH PARTIAL MEDIAL MENISECTOMY;  Surgeon: Carole Civil, MD;  Location: [facility];  Service: Orthopedics;  Laterality: Left;  . PARTIAL KNEE ARTHROPLASTY Left 07/08/2017   Procedure: UNICOMPARTMENTAL KNEE;  Surgeon: Renette Butters, MD;  Location: [facility];  Service: Orthopedics;  Laterality: Left;    There were no vitals filed for this visit.  Subjective Assessment - 07/31/17 1346    Subjective  Pt states that she is doing her exercises.  Her knee still feels stiff which bothers her.     Pertinent History  OA, arthrocopic and partial knee     Limitations  Sitting;Standing;Walking    How long can you sit comfortably?  feels stiff after sitting for an hour     How long can you stand comfortably?  20 minutes     How long can you walk comfortably?  walking with a cane for 20    Patient Stated Goals  Walk without a limp, negotiating steps better,  improved bending     Currently in Pain?  Yes    Pain Score  2     Pain Location  Knee    Pain Orientation  Left    Pain Descriptors / Indicators  Other (Comment) stiffness     Pain Onset  1 to 4 weeks ago                      [facility] Adult PT Treatment/Exercise - 07/31/17 0001      Exercises   Exercises  Knee/Hip      Knee/Hip Exercises: Stretches   Passive Hamstring Stretch  Left;3 reps;30 seconds    Knee: Self-Stretch to increase Flexion  10 seconds;5 reps    Gastroc Stretch  Both;3 reps;30 seconds    Gastroc Stretch Limitations  slant board      Knee/Hip Exercises: Standing   Heel Raises  10 reps    Knee Flexion  10 reps;Left    Forward Lunges  Left;10 reps on 4" step     Terminal Knee Extension  Left;10 reps    Lateral Step Up  10 reps;Step Height: 4";Left;Hand Hold: 1    Forward Step Up  10 reps;Step Height: 4";Hand Hold: 1  Functional Squat  10 reps    SLS  x3                PT Short Term Goals - 07/31/17 1430      PT SHORT TERM GOAL #1   Title  Pt LT knee ROM to be to 125 degrees to be able to sit without increased Left  knee pain for up to 45 minutes to be able to enjoy a meal at a restaurant.     Time  2    Period  Weeks    Status  On-going      PT SHORT TERM GOAL #2   Title  Pt to be able to stand for 30 mintues without increased Left knee pain to cook a small meal     Time  2    Period  Weeks    Status  On-going      PT SHORT TERM GOAL #3   Title  Pt to be  able to walk for 30 mintues without increased left knee pain to be able to perform shopping activities.     Time  2    Period  Weeks    Status  On-going      PT SHORT TERM GOAL #4   Title  Pt to be ambulating without an assistive device in her home.     Time  2    Period  Weeks    Status  On-going      PT SHORT TERM GOAL #5   Title  Pt to be able to single leg stance for 15 seconds on both  leg to allow pt to feel confident walking on unevern ground.     Time  2     Period  Weeks    Status  On-going        PT Long Term Goals - 07/31/17 1431      PT LONG TERM GOAL #1   Title  Pt to be able to sit for 60 minutes without increased left knee pain for traveling.     Time  4    Period  Weeks    Status  On-going      PT LONG TERM GOAL #2   Title  Pt Lt LE to be 5/5 to allow pt to go up and down steps in a reciprocal manner    Time  4    Period  Weeks    Status  On-going      PT LONG TERM GOAL #3   Title  Pt to be able to walk  for 60 minutes without increased left knee pain to allow pt to shop in comfort     Time  4    Period  Weeks    Status  On-going      PT LONG TERM GOAL #4   Title  PT Lt knee extension to be 0 to allow  a normalized heel toe gait patten and be able to walk 450 ft in 3 minutes.     Time  4    Period  Weeks    Status  On-going      PT LONG TERM GOAL #5   Title  Pt to single leg stance for 30 seconds bilaterally to reduce risk of falling.     Time  4    Period  Weeks    Status  On-going            Plan - 07/31/17  1427    Clinical Impression Statement  PT evaluation and goals reviewed with patient.  Began weight bearing activity with minimal cuing needed for proper technique.  PT main limitation at this time appears to be balance.       Rehab Potential  Good    PT Frequency  2x / week    PT Duration  4 weeks    PT Treatment/Interventions  ADLs/Self Care Home Management;Iontophoresis 4mg /ml Dexamethasone;Ultrasound;Gait training;Functional mobility training;Therapeutic activities;Therapeutic exercise;Balance training;Patient/family education;Manual techniques;Other (comment);Stair training LASER    PT Next Visit Plan  Progress balance exercises to tandem gait;  add foam to tandem stance when able;  progression ultimately to warrior poses.  Progress functional stregthening as able ie small steps then full size steps     PT Home Exercise Plan  EVAL:  ankle dorsiflexion, quad set, heelslide and functional minisquats      Consulted and Agree with Plan of Care  Patient       Patient will benefit from skilled therapeutic intervention in order to improve the following deficits and impairments:  Decreased activity tolerance, Abnormal gait, Decreased balance, Decreased range of motion, Decreased strength, Difficulty walking, Pain  Visit Diagnosis: Difficulty in walking, not elsewhere classified  Chronic pain of left knee  Stiffness of left knee, not elsewhere classified  Unsteadiness on feet     Problem List Patient Active Problem List   Diagnosis Date Noted  . Primary osteoarthritis of knee 07/08/2017  . GERD (gastroesophageal reflux disease) 06/16/2017  . Medial meniscus, posterior horn derangement   . Arthritis of knee   . Primary osteoarthritis of left knee 04/05/2014  . Diastolic dysfunction   Rayetta Humphrey, [place] CLT [phone] 07/31/2017, 2:31 PM  [facility] [address] [place], [place], [address] Phone: [phone]   Fax:  [phone]  Name: Laura Carter MRN: [id number] Date of Birth: 1941-03-24

## 2017-08-05 ENCOUNTER — Encounter (HOSPITAL_COMMUNITY): Payer: Self-pay | Admitting: Physical Therapy

## 2017-08-05 ENCOUNTER — Ambulatory Visit (HOSPITAL_COMMUNITY): Payer: Medicare Other | Admitting: Physical Therapy

## 2017-08-05 ENCOUNTER — Other Ambulatory Visit: Payer: Self-pay

## 2017-08-05 DIAGNOSIS — R2242 Localized swelling, mass and lump, left lower limb: Secondary | ICD-10-CM

## 2017-08-05 DIAGNOSIS — G8929 Other chronic pain: Secondary | ICD-10-CM

## 2017-08-05 DIAGNOSIS — R2681 Unsteadiness on feet: Secondary | ICD-10-CM | POA: Diagnosis not present

## 2017-08-05 DIAGNOSIS — M25662 Stiffness of left knee, not elsewhere classified: Secondary | ICD-10-CM

## 2017-08-05 DIAGNOSIS — M25562 Pain in left knee: Secondary | ICD-10-CM | POA: Diagnosis not present

## 2017-08-05 DIAGNOSIS — R262 Difficulty in walking, not elsewhere classified: Secondary | ICD-10-CM | POA: Diagnosis not present

## 2017-08-05 NOTE — Therapy (Signed)
[place], [place], [address] Phone: [phone]   Fax:  [phone]  Physical Therapy Treatment  Patient Details  Name: Laura Carter MRN: [id number] Date of Birth: 19-Dec-1940 Referring Provider: Edmonia Lynch   Encounter Date: 08/05/2017  PT End of Session - 08/05/17 1051    Visit Number  3    Number of Visits  8    Date for PT Re-Evaluation  08/28/17    Authorization - Visit Number  3    Authorization - Number of Visits  8    PT Start Time  6967    PT Stop Time  1115    PT Time Calculation (min)  43 min    Activity Tolerance  Patient tolerated treatment well       Past Medical History:  Diagnosis Date  . Asthma   . Diastolic dysfunction   . GERD (gastroesophageal reflux disease)   . History of hiatal hernia   . Hypertension   . Osteoarthritis of left knee     Past Surgical History:  Procedure Laterality Date  . CHOLECYSTECTOMY  10/10  . COLONOSCOPY    . ESOPHAGOGASTRODUODENOSCOPY    . EYE SURGERY     cataract right  . KNEE ARTHROSCOPY WITH MEDIAL MENISECTOMY Left 06/16/2015   Procedure: KNEE ARTHROSCOPY WITH PARTIAL MEDIAL MENISECTOMY;  Surgeon: Carole Civil, MD;  Location: [facility];  Service: Orthopedics;  Laterality: Left;  . PARTIAL KNEE ARTHROPLASTY Left 07/08/2017   Procedure: UNICOMPARTMENTAL KNEE;  Surgeon: Renette Butters, MD;  Location: [facility];  Service: Orthopedics;  Laterality: Left;    There were no vitals filed for this visit.  Subjective Assessment - 08/05/17 1031    Subjective  PT concerned due to the fact that her knee has been a little more swollen lately.      Pertinent History  OA, arthrocopic and partial knee     Limitations  Sitting;Standing;Walking    How long can you sit comfortably?  feels stiff after sitting for an hour     How long can you stand comfortably?  20 minutes     How long can you walk comfortably?  walking with a cane for 20    Patient Stated Goals   Walk without a limp, negotiating steps better, improved bending     Currently in Pain?  Yes    Pain Score  3     Pain Location  Knee    Pain Orientation  Left    Pain Descriptors / Indicators  Tightness    Pain Type  Acute pain    Pain Onset  1 to 4 weeks ago    Pain Frequency  Intermittent                      [facility] Adult PT Treatment/Exercise - 08/05/17 0001      Exercises   Exercises  Knee/Hip      Knee/Hip Exercises: Stretches   Passive Hamstring Stretch  Left;3 reps;30 seconds    Knee: Self-Stretch to increase Flexion  10 seconds;5 reps    Gastroc Stretch  Both;3 reps;30 seconds    Gastroc Stretch Limitations  slant board      Knee/Hip Exercises: Standing   Heel Raises  10 reps    Knee Flexion  10 reps;Left    Forward Lunges  Left;10 reps on 4" step     Terminal Knee Extension  Left;10 reps    Lateral  Step Up  10 reps;Step Height: 4";Left;Hand Hold: 1    Forward Step Up  10 reps;Step Height: 4";Hand Hold: 1    Step Down  Left;10 reps;Step Height: 4"    Functional Squat  10 reps    SLS  x3     Other Standing Knee Exercises  tandem stance 30" x 2with each foot forward x2        Knee/Hip Exercises: Supine   Quad Sets  10 reps    Knee Extension Limitations  2    Knee Flexion Limitations  122      Manual Therapy   Manual Therapy  Soft tissue mobilization;Other (comment)    Manual therapy comments  completed in adjunt to laser treatment to opposing sites    Soft tissue mobilization  decrease edema                 PT Short Term Goals - 07/31/17 1430      PT SHORT TERM GOAL #1   Title  Pt LT knee ROM to be to 125 degrees to be able to sit without increased Left  knee pain for up to 45 minutes to be able to enjoy a meal at a restaurant.     Time  2    Period  Weeks    Status  On-going      PT SHORT TERM GOAL #2   Title  Pt to be able to stand for 30 mintues without increased Left knee pain to cook a small meal     Time  2    Period  Weeks     Status  On-going      PT SHORT TERM GOAL #3   Title  Pt to be  able to walk for 30 mintues without increased left knee pain to be able to perform shopping activities.     Time  2    Period  Weeks    Status  On-going      PT SHORT TERM GOAL #4   Title  Pt to be ambulating without an assistive device in her home.     Time  2    Period  Weeks    Status  On-going      PT SHORT TERM GOAL #5   Title  Pt to be able to single leg stance for 15 seconds on both  leg to allow pt to feel confident walking on unevern ground.     Time  2    Period  Weeks    Status  On-going        PT Long Term Goals - 07/31/17 1431      PT LONG TERM GOAL #1   Title  Pt to be able to sit for 60 minutes without increased left knee pain for traveling.     Time  4    Period  Weeks    Status  On-going      PT LONG TERM GOAL #2   Title  Pt Lt LE to be 5/5 to allow pt to go up and down steps in a reciprocal manner    Time  4    Period  Weeks    Status  On-going      PT LONG TERM GOAL #3   Title  Pt to be able to walk  for 60 minutes without increased left knee pain to allow pt to shop in comfort     Time  4    Period  Weeks    Status  On-going      PT LONG TERM GOAL #4   Title  PT Lt knee extension to be 0 to allow  a normalized heel toe gait patten and be able to walk 450 ft in 3 minutes.     Time  4    Period  Weeks    Status  On-going      PT LONG TERM GOAL #5   Title  Pt to single leg stance for 30 seconds bilaterally to reduce risk of falling.     Time  4    Period  Weeks    Status  On-going            Plan - 08/05/17 1113    Clinical Impression Statement  Therapist checked knee with no sign of infection or inflammation.  Therapist assured pt that swelling is due to being more active than usual.  ROM and balance are both improving.  Continue with functional activity.     Rehab Potential  Good    PT Frequency  2x / week    PT Duration  4 weeks    PT Treatment/Interventions   ADLs/Self Care Home Management;Iontophoresis 4mg /ml Dexamethasone;Ultrasound;Gait training;Functional mobility training;Therapeutic activities;Therapeutic exercise;Balance training;Patient/family education;Manual techniques;Other (comment);Stair training LASER    PT Next Visit Plan    progression ultimately to warrior poses.  Progress functional stregthening as able ie small steps then full size steps     PT Home Exercise Plan  EVAL:  ankle dorsiflexion, quad set, heelslide and functional minisquats     Consulted and Agree with Plan of Care  Patient       Patient will benefit from skilled therapeutic intervention in order to improve the following deficits and impairments:  Decreased activity tolerance, Abnormal gait, Decreased balance, Decreased range of motion, Decreased strength, Difficulty walking, Pain  Visit Diagnosis: Difficulty in walking, not elsewhere classified  Chronic pain of left knee  Stiffness of left knee, not elsewhere classified  Unsteadiness on feet  Localized swelling, mass and lump, left lower limb     Problem List Patient Active Problem List   Diagnosis Date Noted  . Primary osteoarthritis of knee 07/08/2017  . GERD (gastroesophageal reflux disease) 06/16/2017  . Medial meniscus, posterior horn derangement   . Arthritis of knee   . Primary osteoarthritis of left knee 04/05/2014  . Diastolic dysfunction     Rayetta Humphrey, [place] CLT [phone] 08/05/2017, 11:16 AM  [facility] [address] [place], [place], [address] Phone: [phone]   Fax:  [phone]  Name: Laura Carter MRN: [id number] Date of Birth: June 25, 1940

## 2017-08-07 ENCOUNTER — Encounter (HOSPITAL_COMMUNITY): Payer: Self-pay

## 2017-08-07 ENCOUNTER — Ambulatory Visit (HOSPITAL_COMMUNITY): Payer: Medicare Other

## 2017-08-07 DIAGNOSIS — R262 Difficulty in walking, not elsewhere classified: Secondary | ICD-10-CM | POA: Diagnosis not present

## 2017-08-07 DIAGNOSIS — R2242 Localized swelling, mass and lump, left lower limb: Secondary | ICD-10-CM | POA: Diagnosis not present

## 2017-08-07 DIAGNOSIS — M25662 Stiffness of left knee, not elsewhere classified: Secondary | ICD-10-CM | POA: Diagnosis not present

## 2017-08-07 DIAGNOSIS — R2681 Unsteadiness on feet: Secondary | ICD-10-CM | POA: Diagnosis not present

## 2017-08-07 DIAGNOSIS — M25562 Pain in left knee: Secondary | ICD-10-CM

## 2017-08-07 DIAGNOSIS — G8929 Other chronic pain: Secondary | ICD-10-CM | POA: Diagnosis not present

## 2017-08-07 NOTE — Therapy (Signed)
[place], [place], [address] Phone: [phone]   Fax:  [phone]  Physical Therapy Treatment  Patient Details  Name: Laura Carter MRN: [id number] Date of Birth: 1940-09-17 Referring Provider: Edmonia Lynch   Encounter Date: 08/07/2017  PT End of Session - 08/07/17 1351    Visit Number  4    Number of Visits  8    Date for PT Re-Evaluation  08/28/17    Authorization Type  medicare    Authorization Time Period  cert 32/95/18--12/29/1658    Authorization - Visit Number  4    Authorization - Number of Visits  8    PT Start Time  6301    PT Stop Time  1432    PT Time Calculation (min)  44 min    Activity Tolerance  Patient tolerated treatment well    Behavior During Therapy  [facility] for tasks assessed/performed       Past Medical History:  Diagnosis Date  . Asthma   . Diastolic dysfunction   . GERD (gastroesophageal reflux disease)   . History of hiatal hernia   . Hypertension   . Osteoarthritis of left knee     Past Surgical History:  Procedure Laterality Date  . CHOLECYSTECTOMY  10/10  . COLONOSCOPY    . ESOPHAGOGASTRODUODENOSCOPY    . EYE SURGERY     cataract right  . KNEE ARTHROSCOPY WITH MEDIAL MENISECTOMY Left 06/16/2015   Procedure: KNEE ARTHROSCOPY WITH PARTIAL MEDIAL MENISECTOMY;  Surgeon: Carole Civil, MD;  Location: [facility];  Service: Orthopedics;  Laterality: Left;  . PARTIAL KNEE ARTHROPLASTY Left 07/08/2017   Procedure: UNICOMPARTMENTAL KNEE;  Surgeon: Renette Butters, MD;  Location: [place];  Service: Orthopedics;  Laterality: Left;    There were no vitals filed for this visit.  Subjective Assessment - 08/07/17 1344    Subjective  Pt reports she feels she may have over done it today, she walked her "granddog" on uneven ground this morning to help her daughter.  Stated she continues to have swelling around knee.  Reports increased difficulty getting leg into car due to tightness     Patient Stated Goals  Walk without a limp, negotiating steps better, improved bending     Currently in Pain?  Yes    Pain Score  4     Pain Location  Knee    Pain Orientation  Left    Pain Descriptors / Indicators  Tightness    Pain Type  Acute pain    Pain Onset  1 to 4 weeks ago    Pain Frequency  Intermittent    Aggravating Factors   too much activity    Pain Relieving Factors  not needed    Effect of Pain on Daily Activities  no problem         [facility] PT Assessment - 08/07/17 0001      Assessment   Medical Diagnosis  Lt knee pain    Referring Provider  Edmonia Lynch    Onset Date/Surgical Date  06/15/16    Next MD Visit  08/18/2017    Prior Therapy  PT, chiropractor, acupunture, yoga, massage therapy       Precautions   Precautions  None                  [facility] Adult PT Treatment/Exercise - 08/07/17 0001      Knee/Hip Exercises: Standing   Heel Raises  15 reps  Toe raises on slope    Forward Lunges  Left;10 reps    Forward Lunges Limitations  no UE's onto 2" surface    Lateral Step Up  15 reps;Hand Hold: 1;Step Height: 4"    Forward Step Up  15 reps;Hand Hold: 1;Step Height: 6"    Step Down  Left;10 reps;Step Height: 4"    Functional Squat  15 reps    Other Standing Knee Exercises  sidestep over 12in hurdles 10      Manual Therapy   Manual Therapy  Edema management    Manual therapy comments  Manual complete separate than rest of tx (laser not available this session)    Edema Management  Retro massage with LE elevated             PT Education - 08/07/17 1406    Education provided  Yes    Education Details  educated on benefits of compression hose, paperwork given for [place] taken for thigh high 20-30 mmHg compression hose    Person(s) Educated  Patient    Methods  Explanation;Handout    Comprehension  Verbalized understanding       PT Short Term Goals - 07/31/17 1430      PT SHORT TERM GOAL #1   Title  Pt LT  knee ROM to be to 125 degrees to be able to sit without increased Left  knee pain for up to 45 minutes to be able to enjoy a meal at a restaurant.     Time  2    Period  Weeks    Status  On-going      PT SHORT TERM GOAL #2   Title  Pt to be able to stand for 30 mintues without increased Left knee pain to cook a small meal     Time  2    Period  Weeks    Status  On-going      PT SHORT TERM GOAL #3   Title  Pt to be  able to walk for 30 mintues without increased left knee pain to be able to perform shopping activities.     Time  2    Period  Weeks    Status  On-going      PT SHORT TERM GOAL #4   Title  Pt to be ambulating without an assistive device in her home.     Time  2    Period  Weeks    Status  On-going      PT SHORT TERM GOAL #5   Title  Pt to be able to single leg stance for 15 seconds on both  leg to allow pt to feel confident walking on unevern ground.     Time  2    Period  Weeks    Status  On-going        PT Long Term Goals - 07/31/17 1431      PT LONG TERM GOAL #1   Title  Pt to be able to sit for 60 minutes without increased left knee pain for traveling.     Time  4    Period  Weeks    Status  On-going      PT LONG TERM GOAL #2   Title  Pt Lt LE to be 5/5 to allow pt to go up and down steps in a reciprocal manner    Time  4    Period  Weeks    Status  On-going      PT LONG TERM GOAL #3   Title  Pt to be able to walk  for 60 minutes without increased left knee pain to allow pt to shop in comfort     Time  4    Period  Weeks    Status  On-going      PT LONG TERM GOAL #4   Title  PT Lt knee extension to be 0 to allow  a normalized heel toe gait patten and be able to walk 450 ft in 3 minutes.     Time  4    Period  Weeks    Status  On-going      PT LONG TERM GOAL #5   Title  Pt to single leg stance for 30 seconds bilaterally to reduce risk of falling.     Time  4    Period  Weeks    Status  On-going            Plan - 08/07/17 1518     Clinical Impression Statement  Progressed functional strengthening with increased step up heigth today and added hurdles to improve SLS.  Pt with swelling proximal knee, pt educated on side effects of more weight bearing with standing activitiy.  Pt educated and paperwork given for compression hose to assist with edema and pain control.  EOS with manual retro massage for edema control as Laser not available during session.      Rehab Potential  Good    PT Frequency  2x / week    PT Duration  4 weeks    PT Treatment/Interventions  ADLs/Self Care Home Management;Iontophoresis 4mg /ml Dexamethasone;Ultrasound;Gait training;Functional mobility training;Therapeutic activities;Therapeutic exercise;Balance training;Patient/family education;Manual techniques;Other (comment);Stair training LASER    PT Next Visit Plan    progression ultimately to warrior poses.  Progress functional stregthening as able ie small steps then full size steps        Patient will benefit from skilled therapeutic intervention in order to improve the following deficits and impairments:  Decreased activity tolerance, Abnormal gait, Decreased balance, Decreased range of motion, Decreased strength, Difficulty walking, Pain  Visit Diagnosis: Difficulty in walking, not elsewhere classified  Chronic pain of left knee     Problem List Patient Active Problem List   Diagnosis Date Noted  . Primary osteoarthritis of knee 07/08/2017  . GERD (gastroesophageal reflux disease) 06/16/2017  . Medial meniscus, posterior horn derangement   . Arthritis of knee   . Primary osteoarthritis of left knee 04/05/2014  . Diastolic dysfunction    Ihor Austin, LPTA; CBIS [phone]  Aldona Lento 08/07/2017, 3:48 PM  [place], [place], [address] Phone: [phone]   Fax:  [phone]  Name: Laura Carter MRN: [id number] Date of Birth: Dec 02, 1940

## 2017-08-08 ENCOUNTER — Encounter: Payer: Self-pay | Admitting: Family Medicine

## 2017-08-08 ENCOUNTER — Other Ambulatory Visit: Payer: Self-pay

## 2017-08-08 ENCOUNTER — Ambulatory Visit (INDEPENDENT_AMBULATORY_CARE_PROVIDER_SITE_OTHER): Payer: Medicare Other | Admitting: Family Medicine

## 2017-08-08 VITALS — BP 118/64 | HR 81 | Temp 98.0°F | Resp 16 | Wt 182.2 lb

## 2017-08-08 DIAGNOSIS — J029 Acute pharyngitis, unspecified: Secondary | ICD-10-CM

## 2017-08-08 MED ORDER — AZITHROMYCIN 250 MG PO TABS: ORAL_TABLET | ORAL | 0 refills | Status: DC

## 2017-08-08 NOTE — Progress Notes (Signed)
Patient ID: Laura Carter, female    DOB: 1940/11/29, 77 y.o.   MRN: [id number]  Chief Complaint  Patient presents with  . Sore Throat    itchy and sore, in ear too, more than 3 weeks, tried zinc, ecchinacea, vit c  . Dry mouth    has been using OTC dry mouth care,   . Nasal Congestion    Allergies Patient has no known allergies.  Subjective:   Laura Carter is a 77 y.o. female who presents to [facility] today.  HPI Here for follow up. Has recently had partial left knee replacement. Doing well other than having a sore throat for the past several weeks. Has had a sore throat for three weeks. The throat area on the right side would get itchy, get sore, glands on that side are sore. Used lots of OTC treatments and pain would come and go, for the past several weeks. Last night the pain was worse. Has had sore throat past several days on both side of the throat which has been persistent.  Reports that she has had sinus congestion. No rhinorrhea. No cough. Nasal congestion at night. Sneezing. Pain is scratchyin throat. All started about a week after coming home from the hospital.  Denies any cough, shortness of breath, fever, chills, nausea, vomiting, diarrhea.  Denies any abdominal pain or dysuria.  Reports that it is a bit concerning because she has been hospitalized and developed right after she came home.  She is able to eat and drink fine.  Denies any swelling in oral cavity.    Past Medical History:  Diagnosis Date  . Asthma   . Diastolic dysfunction   . GERD (gastroesophageal reflux disease)   . History of hiatal hernia   . Hypertension   . Osteoarthritis of left knee     Past Surgical History:  Procedure Laterality Date  . CHOLECYSTECTOMY  10/10  . COLONOSCOPY    . ESOPHAGOGASTRODUODENOSCOPY    . EYE SURGERY     cataract right  . KNEE ARTHROSCOPY WITH MEDIAL MENISECTOMY Left 06/16/2015   Procedure: KNEE ARTHROSCOPY WITH PARTIAL MEDIAL MENISECTOMY;  Surgeon:  Carole Civil, MD;  Location: [facility];  Service: Orthopedics;  Laterality: Left;  . PARTIAL KNEE ARTHROPLASTY Left 07/08/2017   Procedure: UNICOMPARTMENTAL KNEE;  Surgeon: Renette Butters, MD;  Location: [place];  Service: Orthopedics;  Laterality: Left;    Family History  Problem Relation Age of Onset  . Cancer Mother        breast  . Stomach cancer Mother   . Heart disease Father   . Heart disease Sister   . Breast cancer Sister      Social History   Socioeconomic History  . Marital status: Married    Spouse name: None  . Number of children: 2  . Years of education: None  . Highest education level: 10th grade  Social Needs  . Financial resource strain: None  . Food insecurity - worry: None  . Food insecurity - inability: None  . Transportation needs - medical: None  . Transportation needs - non-medical: None  Occupational History  . None  Tobacco Use  . Smoking status: Former Smoker    Packs/day: 1.00    Years: 10.00    Pack years: 10.00    Types: Cigarettes    Last attempt to quit: 06/13/1965    Years since quitting: 52.1  . Smokeless tobacco: Never Used  Substance and Sexual Activity  .  Alcohol use: No  . Drug use: No  . Sexual activity: No    Birth control/protection: None  Other Topics Concern  . None  Social History Narrative   Grew up in [place], [place]. Married. Has one daughter and one son. Has two grandchildren.    Did not work outside the home.   Eats all food groups.    Wear seatbelt.    Went to [employer] and graduated from [employer] in [place].     Review of Systems  Constitutional: Negative for appetite change, diaphoresis and fever.  HENT: Positive for ear pain, rhinorrhea and sore throat. Negative for trouble swallowing and voice change.   Respiratory: Negative for cough, chest tightness, shortness of breath and wheezing.   Cardiovascular: Negative for chest pain, palpitations and leg swelling.  Gastrointestinal: Negative for  diarrhea and nausea.  Skin: Negative for rash.     Objective:   BP 118/64 (BP Location: Left Arm, Patient Position: Sitting, Cuff Size: Normal)   Pulse 81   Temp 98 F (36.7 C) (Temporal)   Resp 16   Wt 182 lb 4 oz (82.7 kg)   SpO2 98%   BMI 30.33 kg/m   Physical Exam  Constitutional: She is oriented to person, place, and time. She appears well-developed and well-nourished.  HENT:  Head: Normocephalic and atraumatic.  Right Ear: Hearing, tympanic membrane and ear canal normal.  Left Ear: Hearing, tympanic membrane and ear canal normal.  Mouth/Throat: Uvula is midline and mucous membranes are normal. Mucous membranes are not pale, not dry and not cyanotic. No oral lesions. No uvula swelling. Posterior oropharyngeal erythema present. No oropharyngeal exudate or posterior oropharyngeal edema.  Eyes: EOM are normal. Pupils are equal, round, and reactive to light.  Neck: Normal range of motion. Neck supple.  Cardiovascular: Normal rate, regular rhythm and normal heart sounds.  Pulmonary/Chest: Effort normal and breath sounds normal. She has no wheezes.  Abdominal: Soft. Bowel sounds are normal.  Lymphadenopathy:    She has no cervical adenopathy.  Neurological: She is alert and oriented to person, place, and time.  Skin: Skin is warm and dry. Capillary refill takes less than 2 seconds. No rash noted.  Psychiatric: She has a normal mood and affect. Her behavior is normal.     Assessment and Plan  1. Pharyngitis, unspecified etiology Treat with antibiotics at this time secondary to patient risk factors and duration of symptoms.  Patient was counseled concerning worrisome signs and symptoms and if develop call, return to clinic, or go to the emergency department.  She voiced understanding. Patient counseled in detail regarding the risks of medication. Told to call or return to clinic if develop any worrisome signs or symptoms. Patient voiced understanding.   - azithromycin  (ZITHROMAX) 250 MG tablet; Take two pills today and one pill each day for the next four days  Dispense: 6 tablet; Refill: 0  Return if symptoms worsen or fail to improve. Caren Macadam, MD 08/08/2017

## 2017-08-12 ENCOUNTER — Encounter (HOSPITAL_COMMUNITY): Payer: Self-pay | Admitting: Physical Therapy

## 2017-08-12 ENCOUNTER — Ambulatory Visit (HOSPITAL_COMMUNITY): Payer: Medicare Other | Admitting: Physical Therapy

## 2017-08-12 DIAGNOSIS — R262 Difficulty in walking, not elsewhere classified: Secondary | ICD-10-CM

## 2017-08-12 DIAGNOSIS — R2681 Unsteadiness on feet: Secondary | ICD-10-CM

## 2017-08-12 DIAGNOSIS — M25662 Stiffness of left knee, not elsewhere classified: Secondary | ICD-10-CM | POA: Diagnosis not present

## 2017-08-12 DIAGNOSIS — R2242 Localized swelling, mass and lump, left lower limb: Secondary | ICD-10-CM | POA: Diagnosis not present

## 2017-08-12 DIAGNOSIS — G8929 Other chronic pain: Secondary | ICD-10-CM | POA: Diagnosis not present

## 2017-08-12 DIAGNOSIS — M25562 Pain in left knee: Secondary | ICD-10-CM | POA: Diagnosis not present

## 2017-08-12 NOTE — Therapy (Signed)
[place], [place], [address] Phone: [phone]   Fax:  [phone]  Physical Therapy Treatment  Patient Details  Name: Laura Carter MRN: [id number] Date of Birth: [birth year] Referring Provider: Edmonia Lynch   Encounter Date: 08/12/2017  PT End of Session - 08/12/17 1514    Visit Number  5    Number of Visits  8    Date for PT Re-Evaluation  08/28/17    Authorization Type  medicare    Authorization Time Period  cert 57/32/20--07/01/4268    Authorization - Visit Number  5    Authorization - Number of Visits  8    PT Start Time  6237    PT Stop Time  1514    PT Time Calculation (min)  39 min    Activity Tolerance  Patient tolerated treatment well    Behavior During Therapy  [facility] for tasks assessed/performed       Past Medical History:  Diagnosis Date  . Asthma   . Diastolic dysfunction   . GERD (gastroesophageal reflux disease)   . History of hiatal hernia   . Hypertension   . Osteoarthritis of left knee     Past Surgical History:  Procedure Laterality Date  . CHOLECYSTECTOMY  10/10  . COLONOSCOPY    . ESOPHAGOGASTRODUODENOSCOPY    . EYE SURGERY     cataract right  . KNEE ARTHROSCOPY WITH MEDIAL MENISECTOMY Left 06/16/2015   Procedure: KNEE ARTHROSCOPY WITH PARTIAL MEDIAL MENISECTOMY;  Surgeon: Carole Civil, MD;  Location: [facility];  Service: Orthopedics;  Laterality: Left;  . PARTIAL KNEE ARTHROPLASTY Left 07/08/2017   Procedure: UNICOMPARTMENTAL KNEE;  Surgeon: Renette Butters, MD;  Location: [place];  Service: Orthopedics;  Laterality: Left;    There were no vitals filed for this visit.  Subjective Assessment - 08/12/17 1437    Subjective  Pt states that she has good days and bad days.  Yesterday she was up on her feet quite a bit more than normal and she had a bad night last night   She had a massage this morning and concentrated on her leg which made her feel better.     Pertinent History   OA, arthrocopic and partial knee     Limitations  Sitting;Standing;Walking    How long can you sit comfortably?  feels stiff after sitting for an hour     How long can you stand comfortably?  20 minutes     How long can you walk comfortably?  walking with a cane for 20    Patient Stated Goals  Walk without a limp, negotiating steps better, improved bending     Currently in Pain?  Yes    Pain Score  2     Pain Location  Knee    Pain Orientation  Left    Pain Descriptors / Indicators  Aching    Pain Type  Acute pain    Pain Onset  1 to 4 weeks ago                      [facility] Adult PT Treatment/Exercise - 08/12/17 0001      Balance Poses: Yoga   Warrior I  2 reps;30 seconds    Warrior II  2 reps;30 seconds      Exercises   Exercises  Knee/Hip      Knee/Hip Exercises: Clinical research associate  Both;3 reps;30 seconds  Gastroc Stretch Limitations  slant board      Knee/Hip Exercises: Standing   Heel Raises  15 reps Toe raises on slope    Forward Lunges  Left;20 reps    Forward Lunges Limitations  no UE's onto 2" surface    Lateral Step Up  15 reps;Hand Hold: 1;Step Height: 6"    Forward Step Up  15 reps;Hand Hold: 1;Step Height: 6"    Step Down  Left;10 reps;Step Height: 6"    Functional Squat  15 reps    SLS  x4 10 seconds max     Other Standing Knee Exercises  tandem stance 30" x 2 on foamwith each foot forward x2      Other Standing Knee Exercises  sidestep over 12in hurdles 10      Knee/Hip Exercises: Seated   Sit to Sand  10 reps      Manual Therapy   Manual Therapy  Edema management    Manual therapy comments  Manual complete separate than rest of tx (laser not available this session)    Edema Management  Retro massage with LE elevated               PT Short Term Goals - 07/31/17 1430      PT SHORT TERM GOAL #1   Title  Pt LT knee ROM to be to 125 degrees to be able to sit without increased Left  knee pain for up to 45 minutes to be able  to enjoy a meal at a restaurant.     Time  2    Period  Weeks    Status  On-going      PT SHORT TERM GOAL #2   Title  Pt to be able to stand for 30 mintues without increased Left knee pain to cook a small meal     Time  2    Period  Weeks    Status  On-going      PT SHORT TERM GOAL #3   Title  Pt to be  able to walk for 30 mintues without increased left knee pain to be able to perform shopping activities.     Time  2    Period  Weeks    Status  On-going      PT SHORT TERM GOAL #4   Title  Pt to be ambulating without an assistive device in her home.     Time  2    Period  Weeks    Status  On-going      PT SHORT TERM GOAL #5   Title  Pt to be able to single leg stance for 15 seconds on both  leg to allow pt to feel confident walking on unevern ground.     Time  2    Period  Weeks    Status  On-going        PT Long Term Goals - 07/31/17 1431      PT LONG TERM GOAL #1   Title  Pt to be able to sit for 60 minutes without increased left knee pain for traveling.     Time  4    Period  Weeks    Status  On-going      PT LONG TERM GOAL #2   Title  Pt Lt LE to be 5/5 to allow pt to go up and down steps in a reciprocal manner    Time  4    Period  Weeks  Status  On-going      PT LONG TERM GOAL #3   Title  Pt to be able to walk  for 60 minutes without increased left knee pain to allow pt to shop in comfort     Time  4    Period  Weeks    Status  On-going      PT LONG TERM GOAL #4   Title  PT Lt knee extension to be 0 to allow  a normalized heel toe gait patten and be able to walk 450 ft in 3 minutes.     Time  4    Period  Weeks    Status  On-going      PT LONG TERM GOAL #5   Title  Pt to single leg stance for 30 seconds bilaterally to reduce risk of falling.     Time  4    Period  Weeks    Status  On-going            Plan - 08/12/17 1515    Clinical Impression Statement  Progressed pt by adding reps, sit to stand and warrior poses for stability and  balance.  Pt is improving in balance activiites but still needs minimal contact with high balance such ans sidestepping over hurdles and warrior poses     Rehab Potential  Good    PT Frequency  2x / week    PT Duration  4 weeks    PT Treatment/Interventions  ADLs/Self Care Home Management;Iontophoresis 4mg /ml Dexamethasone;Ultrasound;Gait training;Functional mobility training;Therapeutic activities;Therapeutic exercise;Balance training;Patient/family education;Manual techniques;Other (comment);Stair training LASER    PT Next Visit Plan  begin stair climbing and wall squats        Patient will benefit from skilled therapeutic intervention in order to improve the following deficits and impairments:  Decreased activity tolerance, Abnormal gait, Decreased balance, Decreased range of motion, Decreased strength, Difficulty walking, Pain  Visit Diagnosis: Difficulty in walking, not elsewhere classified  Chronic pain of left knee  Stiffness of left knee, not elsewhere classified  Unsteadiness on feet     Problem List Patient Active Problem List   Diagnosis Date Noted  . Primary osteoarthritis of knee 07/08/2017  . GERD (gastroesophageal reflux disease) 06/16/2017  . Medial meniscus, posterior horn derangement   . Arthritis of knee   . Primary osteoarthritis of left knee 04/05/2014  . Diastolic dysfunction     Rayetta Humphrey, [place] CLT [phone] 08/12/2017, 3:18 PM  [place] [address] [place], [place], [address] Phone: [phone]   Fax:  [phone]  Name: Laura Carter MRN: [id number] Date of Birth: 17-May-1941

## 2017-08-14 ENCOUNTER — Encounter (HOSPITAL_COMMUNITY): Payer: Medicare Other | Admitting: Physical Therapy

## 2017-08-14 ENCOUNTER — Telehealth (HOSPITAL_COMMUNITY): Payer: Self-pay | Admitting: Family Medicine

## 2017-08-14 NOTE — Telephone Encounter (Signed)
08/14/17  her duaghter is in the hospital and she wanted to reschedule for 3/22

## 2017-08-15 ENCOUNTER — Ambulatory Visit (HOSPITAL_COMMUNITY): Payer: Medicare Other

## 2017-08-15 ENCOUNTER — Encounter (HOSPITAL_COMMUNITY): Payer: Self-pay

## 2017-08-15 DIAGNOSIS — G8929 Other chronic pain: Secondary | ICD-10-CM | POA: Diagnosis not present

## 2017-08-15 DIAGNOSIS — R262 Difficulty in walking, not elsewhere classified: Secondary | ICD-10-CM | POA: Diagnosis not present

## 2017-08-15 DIAGNOSIS — R2242 Localized swelling, mass and lump, left lower limb: Secondary | ICD-10-CM | POA: Diagnosis not present

## 2017-08-15 DIAGNOSIS — R2681 Unsteadiness on feet: Secondary | ICD-10-CM | POA: Diagnosis not present

## 2017-08-15 DIAGNOSIS — M25562 Pain in left knee: Secondary | ICD-10-CM | POA: Diagnosis not present

## 2017-08-15 DIAGNOSIS — M25662 Stiffness of left knee, not elsewhere classified: Secondary | ICD-10-CM | POA: Diagnosis not present

## 2017-08-15 NOTE — Therapy (Signed)
[place], [place], [address] Phone: [phone]   Fax:  [phone]  Physical Therapy Treatment  Patient Details  Name: Laura Carter MRN: [id number] Date of Birth: August 09, 1940 Referring Provider: Edmonia Lynch   Encounter Date: 08/15/2017  PT End of Session - 08/15/17 1002    Visit Number  6    Number of Visits  8    Date for PT Re-Evaluation  08/28/17    Authorization Type  medicare    Authorization Time Period  cert 19/41/74--0/12/1446    Authorization - Visit Number  6    Authorization - Number of Visits  8    PT Start Time  0945    PT Stop Time  1027    PT Time Calculation (min)  42 min    Activity Tolerance  Patient tolerated treatment well    Behavior During Therapy  [facility] for tasks assessed/performed       Past Medical History:  Diagnosis Date  . Asthma   . Diastolic dysfunction   . GERD (gastroesophageal reflux disease)   . History of hiatal hernia   . Hypertension   . Osteoarthritis of left knee     Past Surgical History:  Procedure Laterality Date  . CHOLECYSTECTOMY  10/10  . COLONOSCOPY    . ESOPHAGOGASTRODUODENOSCOPY    . EYE SURGERY     cataract right  . KNEE ARTHROSCOPY WITH MEDIAL MENISECTOMY Left 06/16/2015   Procedure: KNEE ARTHROSCOPY WITH PARTIAL MEDIAL MENISECTOMY;  Surgeon: Carole Civil, MD;  Location: [facility];  Service: Orthopedics;  Laterality: Left;  . PARTIAL KNEE ARTHROPLASTY Left 07/08/2017   Procedure: UNICOMPARTMENTAL KNEE;  Surgeon: Renette Butters, MD;  Location: [place];  Service: Orthopedics;  Laterality: Left;    There were no vitals filed for this visit.  Subjective Assessment - 08/15/17 0947    Subjective  Pt reports she's doing well this morning. Working towards doing great.    Pertinent History  OA, arthrocopic and partial knee     Limitations  Sitting;Standing;Walking    How long can you sit comfortably?  feels stiff after sitting for an hour     How  long can you stand comfortably?  20 minutes     How long can you walk comfortably?  walking with a cane for 20    Patient Stated Goals  Walk without a limp, negotiating steps better, improved bending     Currently in Pain?  No/denies    Pain Onset  1 to 4 weeks ago            [facility] Adult PT Treatment/Exercise - 08/15/17 0001      Knee/Hip Exercises: Stretches   Gastroc Stretch  Both;3 reps;30 seconds    Gastroc Stretch Limitations  slant board      Knee/Hip Exercises: Standing   Heel Raises  Both;10 reps;Limitations    Heel Raises Limitations  toes on slope during heel raises and heels on slope during toe raises    Forward Step Up  Left;15 reps;Step Height: 6";Limitations    Forward Step Up Limitations  +R knee drives for increased strengthening and balance    Wall Squat  10 reps    Stairs  x2RT 7", reciprocally with 1 UE on handrail (min cues for eccentric control)    SLS  on foam: max of 8" on LLE, max of     SLS with Vectors  on firm x5RT BLE  Gait Training  fwd tandem gait on firm x2RT    Other Standing Knee Exercises  tandem stance on foam, BLE forward, +palov press with RTB 2x10 each           PT Education - 08/15/17 1002    Education provided  Yes    Education Details  exericse technique    Person(s) Educated  Patient    Methods  Explanation;Demonstration    Comprehension  Verbalized understanding;Returned demonstration       PT Short Term Goals - 07/31/17 1430      PT SHORT TERM GOAL #1   Title  Pt LT knee ROM to be to 125 degrees to be able to sit without increased Left  knee pain for up to 45 minutes to be able to enjoy a meal at a restaurant.     Time  2    Period  Weeks    Status  On-going      PT SHORT TERM GOAL #2   Title  Pt to be able to stand for 30 mintues without increased Left knee pain to cook a small meal     Time  2    Period  Weeks    Status  On-going      PT SHORT TERM GOAL #3   Title  Pt to be  able to walk for 30 mintues without  increased left knee pain to be able to perform shopping activities.     Time  2    Period  Weeks    Status  On-going      PT SHORT TERM GOAL #4   Title  Pt to be ambulating without an assistive device in her home.     Time  2    Period  Weeks    Status  On-going      PT SHORT TERM GOAL #5   Title  Pt to be able to single leg stance for 15 seconds on both  leg to allow pt to feel confident walking on unevern ground.     Time  2    Period  Weeks    Status  On-going        PT Long Term Goals - 07/31/17 1431      PT LONG TERM GOAL #1   Title  Pt to be able to sit for 60 minutes without increased left knee pain for traveling.     Time  4    Period  Weeks    Status  On-going      PT LONG TERM GOAL #2   Title  Pt Lt LE to be 5/5 to allow pt to go up and down steps in a reciprocal manner    Time  4    Period  Weeks    Status  On-going      PT LONG TERM GOAL #3   Title  Pt to be able to walk  for 60 minutes without increased left knee pain to allow pt to shop in comfort     Time  4    Period  Weeks    Status  On-going      PT LONG TERM GOAL #4   Title  PT Lt knee extension to be 0 to allow  a normalized heel toe gait patten and be able to walk 450 ft in 3 minutes.     Time  4    Period  Weeks    Status  On-going  PT LONG TERM GOAL #5   Title  Pt to single leg stance for 30 seconds bilaterally to reduce risk of falling.     Time  4    Period  Weeks    Status  On-going            Plan - 08/15/17 1027    Clinical Impression Statement  Pt presents to therapy with continued reports of stiffness, no pain. She reports that balance continues to be her most challenging thing. Beginning of session focused on strengthening and then remaining part of session focused on balance and dynamic stability of LLE. Progressed pt to SLS on foam and vectors on firm; pt challenged by both. She was also challenged by palov press during tandem stance on foam. She did well with tandem  gait on firm and can be progressed to balance beam next visit. Continue as planned, progressing as able.     Rehab Potential  Good    PT Frequency  2x / week    PT Duration  4 weeks    PT Treatment/Interventions  ADLs/Self Care Home Management;Iontophoresis 4mg /ml Dexamethasone;Ultrasound;Gait training;Functional mobility training;Therapeutic activities;Therapeutic exercise;Balance training;Patient/family education;Manual techniques;Other (comment);Stair training LASER    PT Next Visit Plan  continue and progress balance activities; add tandem gait on foam    Consulted and Agree with Plan of Care  Patient       Patient will benefit from skilled therapeutic intervention in order to improve the following deficits and impairments:  Decreased activity tolerance, Abnormal gait, Decreased balance, Decreased range of motion, Decreased strength, Difficulty walking, Pain  Visit Diagnosis: Difficulty in walking, not elsewhere classified  Chronic pain of left knee     Problem List Patient Active Problem List   Diagnosis Date Noted  . Primary osteoarthritis of knee 07/08/2017  . GERD (gastroesophageal reflux disease) 06/16/2017  . Medial meniscus, posterior horn derangement   . Arthritis of knee   . Primary osteoarthritis of left knee 04/05/2014  . Diastolic dysfunction        Geraldine Solar PT, DPT  [place] [address] [place], [place], [address] Phone: [phone]   Fax:  [phone]  Name: Laura Carter MRN: [id number] Date of Birth: Sep 20, 1940

## 2017-08-18 DIAGNOSIS — M1712 Unilateral primary osteoarthritis, left knee: Secondary | ICD-10-CM | POA: Diagnosis not present

## 2017-08-19 ENCOUNTER — Ambulatory Visit (HOSPITAL_COMMUNITY): Payer: Medicare Other | Admitting: Physical Therapy

## 2017-08-19 ENCOUNTER — Encounter (HOSPITAL_COMMUNITY): Payer: Self-pay | Admitting: Physical Therapy

## 2017-08-19 DIAGNOSIS — R2242 Localized swelling, mass and lump, left lower limb: Secondary | ICD-10-CM | POA: Diagnosis not present

## 2017-08-19 DIAGNOSIS — R2681 Unsteadiness on feet: Secondary | ICD-10-CM | POA: Diagnosis not present

## 2017-08-19 DIAGNOSIS — R262 Difficulty in walking, not elsewhere classified: Secondary | ICD-10-CM

## 2017-08-19 DIAGNOSIS — G8929 Other chronic pain: Secondary | ICD-10-CM | POA: Diagnosis not present

## 2017-08-19 DIAGNOSIS — M25662 Stiffness of left knee, not elsewhere classified: Secondary | ICD-10-CM

## 2017-08-19 DIAGNOSIS — M25562 Pain in left knee: Secondary | ICD-10-CM

## 2017-08-19 NOTE — Therapy (Signed)
[place], [place], [address] Phone: [phone]   Fax:  [phone]  Physical Therapy Treatment  Patient Details  Name: Laura Carter MRN: [id number] Date of Birth: 03-20-1941 Referring Provider: Edmonia Lynch   Encounter Date: 08/19/2017  PT End of Session - 08/19/17 1425    Visit Number  7    Number of Visits  8    Date for PT Re-Evaluation  08/28/17    Authorization Type  medicare    Authorization Time Period  cert 36/64/40--07/29/7423    Authorization - Visit Number  7    Authorization - Number of Visits  8    PT Start Time  9563    PT Stop Time  1427    PT Time Calculation (min)  39 min    Activity Tolerance  Patient tolerated treatment well    Behavior During Therapy  [facility] for tasks assessed/performed       Past Medical History:  Diagnosis Date  . Asthma   . Diastolic dysfunction   . GERD (gastroesophageal reflux disease)   . History of hiatal hernia   . Hypertension   . Osteoarthritis of left knee     Past Surgical History:  Procedure Laterality Date  . CHOLECYSTECTOMY  10/10  . COLONOSCOPY    . ESOPHAGOGASTRODUODENOSCOPY    . EYE SURGERY     cataract right  . KNEE ARTHROSCOPY WITH MEDIAL MENISECTOMY Left 06/16/2015   Procedure: KNEE ARTHROSCOPY WITH PARTIAL MEDIAL MENISECTOMY;  Surgeon: Carole Civil, MD;  Location[facility];  Service: Orthopedics;  Laterality: Left;  . PARTIAL KNEE ARTHROPLASTY Left 07/08/2017   Procedure: UNICOMPARTMENTAL KNEE;  Surgeon: Renette Butters, MD;  Location: [place];  Service: Orthopedics;  Laterality: Left;    There were no vitals filed for this visit.  Subjective Assessment - 08/19/17 1348    Subjective  Pt saw her MD and he felt that she was doing very well.  She states that she feels stiff due to the weather.      Pertinent History  OA, arthrocopic and partial knee     Limitations  Sitting;Standing;Walking    How long can you sit comfortably?  feels  stiff after sitting for an hour     How long can you stand comfortably?  20 minutes     How long can you walk comfortably?  walking with a cane for 20    Patient Stated Goals  Walk without a limp, negotiating steps better, improved bending     Currently in Pain?  -- no pain just some tightness    Pain Onset  1 to 4 weeks ago                No data recorded       [facility] Adult PT Treatment/Exercise - 08/19/17 0001      Knee/Hip Exercises: Stretches   Knee: Self-Stretch to increase Flexion  Left;3 reps;5 reps    Gastroc Stretch  Both;3 reps;30 seconds    Gastroc Stretch Limitations  slant board      Knee/Hip Exercises: Standing   Heel Raises  15 reps    Knee Flexion  Strengthening;Left;10 reps    Forward Lunges  Left;15 reps    Terminal Knee Extension  Left;10 reps    Forward Step Up  Left;15 reps;Step Height: 6";Limitations    Stairs  x3RT 7", reciprocally with 1 UE on handrail (min cues for eccentric control)  Rocker Board  2 minutes    SLS with Vectors  on firm x4RT BLE    Gait Training  fwd tandem gait on firm x2RT    Other Standing Knee Exercises  on foam, BLE forward, +palov press with RTB 2x10 each    Other Standing Knee Exercises  side step on foam x 2 RT                PT Short Term Goals - 07/31/17 1430      PT SHORT TERM GOAL #1   Title  Pt LT knee ROM to be to 125 degrees to be able to sit without increased Left  knee pain for up to 45 minutes to be able to enjoy a meal at a restaurant.     Time  2    Period  Weeks    Status  On-going      PT SHORT TERM GOAL #2   Title  Pt to be able to stand for 30 mintues without increased Left knee pain to cook a small meal     Time  2    Period  Weeks    Status  On-going      PT SHORT TERM GOAL #3   Title  Pt to be  able to walk for 30 mintues without increased left knee pain to be able to perform shopping activities.     Time  2    Period  Weeks    Status  On-going      PT SHORT TERM GOAL #4    Title  Pt to be ambulating without an assistive device in her home.     Time  2    Period  Weeks    Status  On-going      PT SHORT TERM GOAL #5   Title  Pt to be able to single leg stance for 15 seconds on both  leg to allow pt to feel confident walking on unevern ground.     Time  2    Period  Weeks    Status  On-going        PT Long Term Goals - 07/31/17 1431      PT LONG TERM GOAL #1   Title  Pt to be able to sit for 60 minutes without increased left knee pain for traveling.     Time  4    Period  Weeks    Status  On-going      PT LONG TERM GOAL #2   Title  Pt Lt LE to be 5/5 to allow pt to go up and down steps in a reciprocal manner    Time  4    Period  Weeks    Status  On-going      PT LONG TERM GOAL #3   Title  Pt to be able to walk  for 60 minutes without increased left knee pain to allow pt to shop in comfort     Time  4    Period  Weeks    Status  On-going      PT LONG TERM GOAL #4   Title  PT Lt knee extension to be 0 to allow  a normalized heel toe gait patten and be able to walk 450 ft in 3 minutes.     Time  4    Period  Weeks    Status  On-going      PT LONG TERM GOAL #5  Title  Pt to single leg stance for 30 seconds bilaterally to reduce risk of falling.     Time  4    Period  Weeks    Status  On-going            Plan - 08/19/17 1426    Clinical Impression Statement  PT continues to feel minimal pain from her left knee although she does have stiffness.  Pt states MD told her that she would have stiffness for up to six month.  Pt main limitation at this time is balance but pt is aware that she will need to work for the rest of  her life on this.     Rehab Potential  Good    PT Frequency  2x / week    PT Duration  4 weeks    PT Treatment/Interventions  ADLs/Self Care Home Management;Iontophoresis 4mg /ml Dexamethasone;Ultrasound;Gait training;Functional mobility training;Therapeutic activities;Therapeutic exercise;Balance training;Patient/family  education;Manual techniques;Other (comment);Stair training LASER    PT Next Visit Plan  reassess with probable discharge.     Consulted and Agree with Plan of Care  Patient       Patient will benefit from skilled therapeutic intervention in order to improve the following deficits and impairments:  Decreased activity tolerance, Abnormal gait, Decreased balance, Decreased range of motion, Decreased strength, Difficulty walking, Pain  Visit Diagnosis: Difficulty in walking, not elsewhere classified  Chronic pain of left knee  Stiffness of left knee, not elsewhere classified     Problem List Patient Active Problem List   Diagnosis Date Noted  . Primary osteoarthritis of knee 07/08/2017  . GERD (gastroesophageal reflux disease) 06/16/2017  . Medial meniscus, posterior horn derangement   . Arthritis of knee   . Primary osteoarthritis of left knee 04/05/2014  . Diastolic dysfunction     Rayetta Humphrey, [place] CLT [phone] 08/19/2017, 2:29 PM  [place] [address] [place], [place], [address] Phone: [phone]   Fax:  [phone]  Name: LAKECHIA NAY MRN: [id number] Date of Birth: [birth year]

## 2017-08-21 ENCOUNTER — Ambulatory Visit (HOSPITAL_COMMUNITY): Payer: Medicare Other | Admitting: Physical Therapy

## 2017-08-21 ENCOUNTER — Encounter (HOSPITAL_COMMUNITY): Payer: Self-pay | Admitting: Physical Therapy

## 2017-08-21 DIAGNOSIS — M25662 Stiffness of left knee, not elsewhere classified: Secondary | ICD-10-CM

## 2017-08-21 DIAGNOSIS — M25562 Pain in left knee: Secondary | ICD-10-CM | POA: Diagnosis not present

## 2017-08-21 DIAGNOSIS — R2681 Unsteadiness on feet: Secondary | ICD-10-CM

## 2017-08-21 DIAGNOSIS — R262 Difficulty in walking, not elsewhere classified: Secondary | ICD-10-CM | POA: Diagnosis not present

## 2017-08-21 DIAGNOSIS — R2242 Localized swelling, mass and lump, left lower limb: Secondary | ICD-10-CM | POA: Diagnosis not present

## 2017-08-21 DIAGNOSIS — G8929 Other chronic pain: Secondary | ICD-10-CM | POA: Diagnosis not present

## 2017-08-21 NOTE — Therapy (Signed)
[place], [place], [address] Phone: [phone]   Fax:  [phone]  Physical Therapy Treatment/Discharge   Patient Details  Name: Laura Carter MRN: [id number] Date of Birth: 1941/02/01 Referring Provider: Edmonia Lynch    Encounter Date: 08/21/2017  PT End of Session - 08/21/17 1427    Visit Number  8    Number of Visits  8    Date for PT Re-Evaluation  08/28/17    Authorization Type  medicare    Authorization Time Period  cert 36/64/40--07/29/7423    Authorization - Visit Number  8    Authorization - Number of Visits  8    PT Start Time  9563    PT Stop Time  1420    PT Time Calculation (min)  35 min    Activity Tolerance  Patient tolerated treatment well    Behavior During Therapy  [facility] for tasks assessed/performed       Past Medical History:  Diagnosis Date  . Asthma   . Diastolic dysfunction   . GERD (gastroesophageal reflux disease)   . History of hiatal hernia   . Hypertension   . Osteoarthritis of left knee     Past Surgical History:  Procedure Laterality Date  . CHOLECYSTECTOMY  10/10  . COLONOSCOPY    . ESOPHAGOGASTRODUODENOSCOPY    . EYE SURGERY     cataract right  . KNEE ARTHROSCOPY WITH MEDIAL MENISECTOMY Left 06/16/2015   Procedure: KNEE ARTHROSCOPY WITH PARTIAL MEDIAL MENISECTOMY;  Surgeon: Carole Civil, MD;  Location[facility];  Service: Orthopedics;  Laterality: Left;  . PARTIAL KNEE ARTHROPLASTY Left 07/08/2017   Procedure: UNICOMPARTMENTAL KNEE;  Surgeon: Renette Butters, MD;  Location: [place];  Service: Orthopedics;  Laterality: Left;    There were no vitals filed for this visit.  Subjective Assessment - 08/21/17 1359    Subjective  PT states that she has good days and bad days     Pertinent History  OA, arthrocopic and partial knee     Limitations  Sitting;Standing;Walking    How long can you sit comfortably?  feels stiff after sitting for an hour and a half was  an hour      How long can you stand comfortably?  Able to stand for 30  was 20 minutes     How long can you walk comfortably?  Pt still walking with cane she can walk for about 40 minutes was walking with a cane for 20    Patient Stated Goals  Walk without a limp, negotiating steps better, improved bending     Currently in Pain?  -- More stiffness than anything     Pain Onset  1 to 4 weeks ago         [facility] PT Assessment - 08/21/17 0001      Assessment   Medical Diagnosis  Lt knee pain    Referring Provider  Edmonia Lynch     Onset Date/Surgical Date  06/15/16    Next MD Visit  10/18/2017    Prior Therapy  PT, chiropractor, acupunture, yoga, massage therapy       Precautions   Precautions  None      Restrictions   Weight Bearing Restrictions  No      Home Environment   Living Environment  Private residence    Type of [place]      Prior Function  Level of Independence  Independent    Vocation  Retired    Leisure  yoga, walking       Cognition   Overall Cognitive Status  Within Functional Limits for tasks assessed      Observation/Other Assessments   Focus on Therapeutic Outcomes (FOTO)    53 was 45      Functional Tests   Functional tests  Sit to Stand;Single leg stance      Single Leg Stance   Comments  RT:  51 was 13  LT: 21 was 8      Sit to Stand   Comments  5 x 16.85 was 18.05       AROM   Left Knee Extension  2 was 4    Left Knee Flexion  120 was 120      Strength   Left Hip Flexion  5/5    Left Hip Extension  5/5 was 4-/5     Left Hip ABduction  5/5 was 4/5     Left Knee Flexion  5/5    Left Knee Extension  5/5    Left Ankle Dorsiflexion  5/5      6 minute walk test results    Aerobic Endurance Distance Walked  740 was 406    Endurance additional comments  37mnute walk test decreased arm swing and flat foot.             No data recorded     Therapist and pt spoke at length about HEP, icing and self massaging.              PT Short Term Goals - 08/21/17 1417      PT SHORT TERM GOAL #1   Title  Pt LT knee ROM to be to 125 degrees to be able to sit without increased Left  knee pain for up to 45 minutes to be able to enjoy a meal at a restaurant.     Baseline  able to sit for 45 but ROM is 120    Time  2    Period  Weeks    Status  Partially Met      PT SHORT TERM GOAL #2   Title  Pt to be able to stand for 30 mintues without increased Left knee pain to cook a small meal     Time  2    Period  Weeks    Status  Achieved      PT SHORT TERM GOAL #3   Title  Pt to be  able to walk for 30 mintues without increased left knee pain to be able to perform shopping activities.     Time  2    Period  Weeks    Status  Achieved      PT SHORT TERM GOAL #4   Title  Pt to be ambulating without an assistive device in her home.     Time  2    Period  Weeks    Status  Achieved      PT SHORT TERM GOAL #5   Title  Pt to be able to single leg stance for 15 seconds on both  leg to allow pt to feel confident walking on unevern ground.     Time  2    Period  Weeks    Status  Achieved        PT Long Term Goals - 08/21/17 1418      PT LONG TERM  GOAL #1   Title  Pt to be able to sit for 60 minutes without increased left knee pain for traveling.     Time  4    Period  Weeks    Status  Achieved      PT LONG TERM GOAL #2   Title  Pt Lt LE to be 5/5 to allow pt to go up and down steps in a reciprocal manner    Time  4    Period  Weeks    Status  Achieved      PT LONG TERM GOAL #3   Title  Pt to be able to walk  for 60 minutes without increased left knee pain to allow pt to shop in comfort     Time  4    Period  Weeks    Status  Not Met      PT LONG TERM GOAL #4   Title  PT Lt knee extension to be 0 to allow  a normalized heel toe gait patten and be able to walk 450 ft in 3 minutes.     Baseline  currently 2     Time  4    Period  Weeks    Status  Not Met      PT LONG TERM GOAL #5    Title  Pt to single leg stance for 30 seconds bilaterally to reduce risk of falling.     Baseline  met on right not on left     Time  4    Period  Weeks    Status  Partially Met            Plan - 08/21/17 1427    Clinical Impression Statement  Pt reassessed.  She has met 5/5 STG; met 2/5 LTG; paritally met 2/5 LTG. The only goal she has not met is walking for 60 minutes and this is due to the fact that she does not typically walk that much at one time.  Pt was encouraged to walk for exercise at least 3 times a week.  PT has normal strength, improved balance and functional ROM.  PT is ready to be discharged from skilled physical therapy.     Rehab Potential  Good    PT Frequency  2x / week    PT Duration  4 weeks    PT Treatment/Interventions  ADLs/Self Care Home Management;Iontophoresis 95m/ml Dexamethasone;Ultrasound;Gait training;Functional mobility training;Therapeutic activities;Therapeutic exercise;Balance training;Patient/family education;Manual techniques;Other (comment);Stair training LASER    PT Next Visit Plan  Discharge to HEP     Consulted and Agree with Plan of Care  Patient       Patient will benefit from skilled therapeutic intervention in order to improve the following deficits and impairments:  Decreased activity tolerance, Abnormal gait, Decreased balance, Decreased range of motion, Decreased strength, Difficulty walking, Pain  Visit Diagnosis: Stiffness of left knee, not elsewhere classified  Unsteadiness on feet     Problem List Patient Active Problem List   Diagnosis Date Noted  . Primary osteoarthritis of knee 07/08/2017  . GERD (gastroesophageal reflux disease) 06/16/2017  . Medial meniscus, posterior horn derangement   . Arthritis of knee   . Primary osteoarthritis of left knee 04/05/2014  . Diastolic dysfunction    CRayetta Humphrey PVirginiaCLT [phone]/28/2019, 2:31 PM  [place] [address]Phone: [phone]  Fax:  [phone] Name: Laura GRANTHAMMRN: [id number]  Date of Birth: 24-Nov-1940  PHYSICAL THERAPY DISCHARGE SUMMARY  Visits from Start of Care: 8  Current functional level related to goals / functional outcomes: See above   Remaining deficits: See above   Education / Equipment: See above  Plan: Patient agrees to discharge.  Patient goals were partially met. Patient is being discharged due to being pleased with the current functional level.  ?????       Rayetta Humphrey, [place] CLT [phone]

## 2017-09-01 ENCOUNTER — Other Ambulatory Visit: Payer: Medicare Other

## 2017-09-09 ENCOUNTER — Encounter: Payer: Self-pay | Admitting: Family Medicine

## 2017-09-09 ENCOUNTER — Ambulatory Visit
Admission: RE | Admit: 2017-09-09 | Discharge: 2017-09-09 | Disposition: A | Discharge status: Home / Self Care | Payer: Medicare Other | Source: Ambulatory Visit | Attending: Family Medicine | Admitting: Family Medicine

## 2017-09-09 DIAGNOSIS — Z1231 Encounter for screening mammogram for malignant neoplasm of breast: Secondary | ICD-10-CM

## 2017-09-09 DIAGNOSIS — Z780 Asymptomatic menopausal state: Secondary | ICD-10-CM | POA: Diagnosis not present

## 2017-09-09 DIAGNOSIS — M85852 Other specified disorders of bone density and structure, left thigh: Secondary | ICD-10-CM | POA: Diagnosis not present

## 2017-09-09 DIAGNOSIS — E2839 Other primary ovarian failure: Secondary | ICD-10-CM

## 2017-09-11 ENCOUNTER — Other Ambulatory Visit: Payer: Self-pay | Admitting: Family Medicine

## 2017-09-11 DIAGNOSIS — R928 Other abnormal and inconclusive findings on diagnostic imaging of breast: Secondary | ICD-10-CM

## 2017-09-16 ENCOUNTER — Ambulatory Visit: Admission: RE | Admit: 2017-09-16 | Payer: Medicare Other | Source: Ambulatory Visit

## 2017-09-16 ENCOUNTER — Ambulatory Visit
Admission: RE | Admit: 2017-09-16 | Discharge: 2017-09-16 | Disposition: A | Discharge status: Home / Self Care | Payer: Medicare Other | Source: Ambulatory Visit | Attending: Family Medicine | Admitting: Family Medicine

## 2017-09-16 ENCOUNTER — Ambulatory Visit: Payer: Medicare Other

## 2017-09-16 ENCOUNTER — Other Ambulatory Visit: Payer: Self-pay | Admitting: Family Medicine

## 2017-09-16 DIAGNOSIS — R921 Mammographic calcification found on diagnostic imaging of breast: Secondary | ICD-10-CM | POA: Diagnosis not present

## 2017-09-16 DIAGNOSIS — R928 Other abnormal and inconclusive findings on diagnostic imaging of breast: Secondary | ICD-10-CM

## 2017-09-23 ENCOUNTER — Other Ambulatory Visit: Payer: Self-pay | Admitting: Family Medicine

## 2017-09-23 ENCOUNTER — Ambulatory Visit
Admission: RE | Admit: 2017-09-23 | Discharge: 2017-09-23 | Disposition: A | Discharge status: Home / Self Care | Payer: Medicare Other | Source: Ambulatory Visit | Attending: Family Medicine | Admitting: Family Medicine

## 2017-09-23 DIAGNOSIS — D242 Benign neoplasm of left breast: Secondary | ICD-10-CM | POA: Diagnosis not present

## 2017-09-23 DIAGNOSIS — R928 Other abnormal and inconclusive findings on diagnostic imaging of breast: Secondary | ICD-10-CM

## 2017-09-23 DIAGNOSIS — R921 Mammographic calcification found on diagnostic imaging of breast: Secondary | ICD-10-CM | POA: Diagnosis not present

## 2017-10-15 DIAGNOSIS — M1712 Unilateral primary osteoarthritis, left knee: Secondary | ICD-10-CM | POA: Diagnosis not present

## 2017-10-30 ENCOUNTER — Encounter: Payer: Self-pay | Admitting: Family Medicine

## 2017-10-31 ENCOUNTER — Encounter: Payer: Self-pay | Admitting: Family Medicine

## 2017-11-10 ENCOUNTER — Telehealth: Payer: Self-pay | Admitting: Family Medicine

## 2017-11-10 NOTE — Telephone Encounter (Signed)
Ret call no answer

## 2017-11-18 ENCOUNTER — Ambulatory Visit (INDEPENDENT_AMBULATORY_CARE_PROVIDER_SITE_OTHER): Payer: Medicare Other | Admitting: Family Medicine

## 2017-11-18 ENCOUNTER — Encounter: Payer: Self-pay | Admitting: Family Medicine

## 2017-11-18 ENCOUNTER — Other Ambulatory Visit: Payer: Self-pay

## 2017-11-18 VITALS — BP 138/64 | HR 76 | Temp 98.4°F | Resp 14 | Ht 65.0 in | Wt 183.0 lb

## 2017-11-18 DIAGNOSIS — I10 Essential (primary) hypertension: Secondary | ICD-10-CM | POA: Diagnosis not present

## 2017-11-18 DIAGNOSIS — Z23 Encounter for immunization: Secondary | ICD-10-CM | POA: Diagnosis not present

## 2017-11-18 MED ORDER — ASPIRIN EC 81 MG PO TBEC: 81.0000 mg | DELAYED_RELEASE_TABLET | Freq: Every day | ORAL | Status: DC

## 2017-11-18 NOTE — Progress Notes (Signed)
Patient ID: Laura Carter, female    DOB: 1940-05-29, 77 y.o.   MRN: [id number]  Chief Complaint  Patient presents with  . Hypertension    patient concerned about blood pressure    Allergies Patient has no known allergies.  Subjective:   Laura Carter is a 77 y.o. female who presents to [facility] today.  HPI Here for follow up b/c of BP. Reports that has had 2 episodes in the past couple months where she had a bad dream and woke up in the middle of the night.  She reports that when she woke up she felt very stressed and so she checked her blood pressure and it was elevated.  She wanted to come in and have her blood pressure checked.  She reports that she was concerned that her blood pressure could be running high which could put her at increased risk for stroke.  She denies any chest pain or chest pressure.  She denies any episodes of numbness or tingling in her extremities.  She denies any history consistent with a TIA.  She denies any palpitations.  She reports that when she awoke with the bad dream that she did have a pressure in her head.  She reports that it resolved.  She comes in today for reassurance for her blood pressure.  She also came into the office yesterday and had her blood pressure checked, which was within normal limits.   Past Medical History:  Diagnosis Date  . Asthma   . Diastolic dysfunction   . GERD (gastroesophageal reflux disease)   . History of hiatal hernia   . Hypertension   . Osteoarthritis of left knee     Past Surgical History:  Procedure Laterality Date  . CHOLECYSTECTOMY  10/10  . COLONOSCOPY    . ESOPHAGOGASTRODUODENOSCOPY    . EYE SURGERY     cataract right  . KNEE ARTHROSCOPY WITH MEDIAL MENISECTOMY Left 06/16/2015   Procedure: KNEE ARTHROSCOPY WITH PARTIAL MEDIAL MENISECTOMY;  Surgeon: Carole Civil, MD;  Location: [facility];  Service: Orthopedics;  Laterality: Left;  . PARTIAL KNEE ARTHROPLASTY Left 07/08/2017   Procedure:  UNICOMPARTMENTAL KNEE;  Surgeon: Renette Butters, MD;  Location: [place];  Service: Orthopedics;  Laterality: Left;    Family History  Problem Relation Age of Onset  . Cancer Mother        breast  . Stomach cancer Mother   . Breast cancer Mother   . Heart disease Father   . Heart disease Sister   . Breast cancer Sister      Social History   Socioeconomic History  . Marital status: Married    Spouse name: Not on file  . Number of children: 2  . Years of education: Not on file  . Highest education level: 10th grade  Occupational History  . Not on file  Social Needs  . Financial resource strain: Not on file  . Food insecurity:    Worry: Not on file    Inability: Not on file  . Transportation needs:    Medical: Not on file    Non-medical: Not on file  Tobacco Use  . Smoking status: Former Smoker    Packs/day: 1.00    Years: 10.00    Pack years: 10.00    Types: Cigarettes    Last attempt to quit: 06/13/1965    Years since quitting: 52.4  . Smokeless tobacco: Never Used  Substance and Sexual Activity  .  Alcohol use: No  . Drug use: No  . Sexual activity: Never    Birth control/protection: None  Lifestyle  . Physical activity:    Days per week: Not on file    Minutes per session: Not on file  . Stress: Not on file  Relationships  . Social connections:    Talks on phone: Not on file    Gets together: Not on file    Attends religious service: Not on file    Active member of club or organization: Not on file    Attends meetings of clubs or organizations: Not on file    Relationship status: Not on file  Other Topics Concern  . Not on file  Social History Narrative   Grew up in [place], [place]. Married. Has one daughter and one son. Has two grandchildren.    Did not work outside the home.   Eats all food groups.    Wear seatbelt.    Went to [employer] and graduated from [employer] in [place].    Current Outpatient Medications on File Prior to Visit    Medication Sig Dispense Refill  . Cholecalciferol (VITAMIN D3 PO) Take 1 tablet by mouth daily.    . Coenzyme Q10 (COQ10 PO) Take 1 capsule by mouth daily.    . Cyanocobalamin (VITAMIN B-12 PO) Take 1 tablet by mouth daily.    Marland Kitchen KRILL OIL PO Take 1 capsule by mouth daily.    . Magnesium 500 MG CAPS Take 500 mg by mouth at bedtime.     . Melatonin 3 MG TABS Take 3 mg by mouth at bedtime as needed (for sleep.).    Marland Kitchen metoprolol succinate (TOPROL-XL) 25 MG 24 hr tablet TAKE (1) TABLET BY MOUTH ONCE DAILY. 90 tablet 3  . Multiple Vitamins-Minerals (ZINC PO) Take 1 tablet by mouth daily as needed (for immune health/onset of cold).    Marland Kitchen omeprazole (PRILOSEC OTC) 20 MG tablet Take 20 mg by mouth at bedtime.    . Pyridoxine HCl (VITAMIN B-6 PO) Take 1 tablet by mouth daily.     No current facility-administered medications on file prior to visit.     Review of Systems  Constitutional: Negative for activity change, appetite change, chills, fatigue and unexpected weight change.  Respiratory: Negative for choking, chest tightness, shortness of breath and wheezing.   Cardiovascular: Negative for chest pain, palpitations and leg swelling.  Gastrointestinal: Negative for abdominal pain and nausea.  Musculoskeletal: Negative for gait problem and myalgias.  Skin: Negative for rash.  Neurological: Negative for dizziness, tremors, seizures, syncope, facial asymmetry, speech difficulty, weakness, light-headedness and numbness.  Psychiatric/Behavioral: Negative for dysphoric mood and sleep disturbance.     Objective:   BP 138/64 (BP Location: Right Arm, Patient Position: Sitting, Cuff Size: Large)   Pulse 76   Temp 98.4 F (36.9 C) (Temporal)   Resp 14   Ht 5\' 5"  (1.651 m)   Wt 183 lb 0.6 oz (83 kg)   SpO2 95%   BMI 30.46 kg/m   Physical Exam  Constitutional: She is oriented to person, place, and time. She appears well-developed and well-nourished. No distress.  HENT:  Head: Normocephalic and  atraumatic.  Eyes: Pupils are equal, round, and reactive to light.  Neck: Normal range of motion. Neck supple. No thyromegaly present.  Cardiovascular: Normal rate, regular rhythm and normal heart sounds.  No murmur heard. Pulmonary/Chest: Effort normal and breath sounds normal. No respiratory distress.  Neurological: She is alert and oriented  to person, place, and time. No cranial nerve deficit.  Skin: Skin is warm and dry.  Nursing note and vitals reviewed.    Assessment and Plan  1. Essential hypertension Continue blood pressure medications as directed.  Patient was given reassurance regarding her blood pressure.  She was counseled that if she continues to have bad dreams please follow-up to discuss.  She reports that she believes this was related to some things that she watched on the television prior to going to bed. She will continue to monitor her blood pressure at home and call with any questions or concerns.  Reassurance was given. To new current medications as directed.  No indication for change today. I did call the pharmacy to ensure that she was getting the Toprol-XL dosing of her beta-blocker.  This is what has been refilled.  Patient was reassured and she felt much better. - aspirin EC 81 MG tablet; Take 1 tablet (81 mg total) by mouth daily.  2. Immunization due Vaccination given today. - Td : Tetanus/diphtheria >7yo Preservative  free  Return in about 3 months (around 02/18/2018). Caren Macadam, MD 11/18/2017

## 2017-11-28 DIAGNOSIS — S1095XA Superficial foreign body of unspecified part of neck, initial encounter: Secondary | ICD-10-CM | POA: Diagnosis not present

## 2017-12-01 ENCOUNTER — Telehealth: Payer: Self-pay | Admitting: Family Medicine

## 2017-12-01 NOTE — Telephone Encounter (Signed)
Left voicemail on 336/ [phone] Also called 336/ [phone] to return call. Spoke to patient, patient went to Urgent Care in [facility] Friday for tick bite to have head of tick removed. She was given rx of doxycycline She wants to know if she needs to make an appointment to followup.

## 2017-12-01 NOTE — Telephone Encounter (Signed)
Advise patient that she does not need to follow up as long as she completes the medication and is doing well. Gwen Her. Mannie Stabile, MD

## 2017-12-01 NOTE — Telephone Encounter (Signed)
Spoke with patient and advised her of Dr.Hagler's recommendations with verbal understanding.

## 2017-12-04 ENCOUNTER — Encounter: Payer: Self-pay | Admitting: Family Medicine

## 2017-12-04 ENCOUNTER — Ambulatory Visit (INDEPENDENT_AMBULATORY_CARE_PROVIDER_SITE_OTHER): Payer: Medicare Other | Admitting: Family Medicine

## 2017-12-04 ENCOUNTER — Other Ambulatory Visit: Payer: Self-pay

## 2017-12-04 VITALS — BP 140/90 | HR 70 | Temp 98.0°F | Resp 12 | Ht 65.0 in | Wt 185.0 lb

## 2017-12-04 DIAGNOSIS — L989 Disorder of the skin and subcutaneous tissue, unspecified: Secondary | ICD-10-CM | POA: Diagnosis not present

## 2017-12-04 MED ORDER — MUPIROCIN CALCIUM 2 % EX CREA: 1.0000 "application " | TOPICAL_CREAM | Freq: Three times a day (TID) | CUTANEOUS | 0 refills | Status: DC

## 2017-12-04 NOTE — Progress Notes (Signed)
Patient ID: Laura Carter, female    DOB: Feb 22, 1941, 76 y.o.   MRN: [id number]  Chief Complaint  Patient presents with  . Tick Removal    left side of lower back. sore to touch. small scab middle of area. red    Allergies Patient has no known allergies.  Subjective:   Laura Carter is a 77 y.o. female who presents to [facility] today.  HPI Laura Carter presents for follow-up visit after being seen at the urgent care for a tick bite.  She was seen last week at the urgent care and had a tick removed and was subsequently placed on doxycycline 100 mg twice a day for 10 days.  She has 4 more days of the medication to complete.  The tick was found on the back of her neck and she went to the urgent care to have it removed.  She reports that she has subsequently noticed a place on the lower left side of her back that has been itching and is irritated.  She wants to make sure there is not a tick there because her husband looked at the area and was not sure.  She is unable to see this area because it is on her back.  She reports that she has been scratching it at times and her husband reports that it is red.  She is not having any pain at the site but reports that he is slightly tender.  She denies any fevers, chills, nausea, vomiting, diarrhea, or other rashes.  She reports that she feels good.  She is taking the doxycycline with no side effects.  Her energy is good.  She is outside on and off and believes she could have gotten bitten by something.  She wanted to come in and have this checked.  Otherwise she has no questions or concerns today.   Past Medical History:  Diagnosis Date  . Asthma   . Diastolic dysfunction   . GERD (gastroesophageal reflux disease)   . History of hiatal hernia   . Hypertension   . Osteoarthritis of left knee     Past Surgical History:  Procedure Laterality Date  . CHOLECYSTECTOMY  10/10  . COLONOSCOPY    . ESOPHAGOGASTRODUODENOSCOPY    . EYE SURGERY      cataract right  . KNEE ARTHROSCOPY WITH MEDIAL MENISECTOMY Left 06/16/2015   Procedure: KNEE ARTHROSCOPY WITH PARTIAL MEDIAL MENISECTOMY;  Surgeon: Carole Civil, MD;  Location: [facility];  Service: Orthopedics;  Laterality: Left;  . PARTIAL KNEE ARTHROPLASTY Left 07/08/2017   Procedure: UNICOMPARTMENTAL KNEE;  Surgeon: Renette Butters, MD;  Location: [place];  Service: Orthopedics;  Laterality: Left;    Family History  Problem Relation Age of Onset  . Cancer Mother        breast  . Stomach cancer Mother   . Breast cancer Mother   . Heart disease Father   . Heart disease Sister   . Breast cancer Sister      Social History   Socioeconomic History  . Marital status: Married    Spouse name: Not on file  . Number of children: 2  . Years of education: Not on file  . Highest education level: 10th grade  Occupational History  . Not on file  Social Needs  . Financial resource strain: Not on file  . Food insecurity:    Worry: Not on file    Inability: Not on file  . Transportation  needs:    Medical: Not on file    Non-medical: Not on file  Tobacco Use  . Smoking status: Former Smoker    Packs/day: 1.00    Years: 10.00    Pack years: 10.00    Types: Cigarettes    Last attempt to quit: 06/13/1965    Years since quitting: 52.5  . Smokeless tobacco: Never Used  Substance and Sexual Activity  . Alcohol use: No  . Drug use: No  . Sexual activity: Never    Birth control/protection: None  Lifestyle  . Physical activity:    Days per week: Not on file    Minutes per session: Not on file  . Stress: Not on file  Relationships  . Social connections:    Talks on phone: Not on file    Gets together: Not on file    Attends religious service: Not on file    Active member of club or organization: Not on file    Attends meetings of clubs or organizations: Not on file    Relationship status: Not on file  Other Topics Concern  . Not on file  Social History Narrative   Grew up  in [place], [place]. Married. Has one daughter and one son. Has two grandchildren.    Did not work outside the home.   Eats all food groups.    Wear seatbelt.    Went to [employer] and graduated from [employer] in [place].     Review of Systems  Constitutional: Negative for activity change, appetite change, chills, diaphoresis, fatigue and fever.  HENT: Negative for congestion, facial swelling, trouble swallowing and voice change.   Respiratory: Negative for apnea, cough and choking.   Cardiovascular: Negative for chest pain, palpitations and leg swelling.  Skin: Negative for rash.     Objective:   BP 140/90 (BP Location: Right Arm, Patient Position: Sitting, Cuff Size: Large)   Pulse 70   Temp 98 F (36.7 C) (Temporal)   Resp 12   Ht 5\' 5"  (1.651 m)   Wt 185 lb 0.6 oz (83.9 kg)   SpO2 96%   BMI 30.79 kg/m   Physical Exam  Constitutional: She appears well-developed and well-nourished.  Cardiovascular: Normal rate, regular rhythm and normal heart sounds.  Pulmonary/Chest: Effort normal and breath sounds normal.  Abdominal: Soft.  Skin:  0.5 cm erythematous welp on left lower back, at skin fold.  Center of lesion with superficial excoriation.  No tick or foreign body present.  No other associated skin rashes or lesions.  No vesicles.  Vitals reviewed.    Assessment and Plan  1. Skin lesion of back Discussed with patient that this is most likely an insect bite that she has subsequently scratched and it is become irritated/inflamed.  There is some mild redness associated with this which could be due to inflammation, irritation, or possible infection.  She is currently on doxycycline.  There is no evidence of cellulitis/abscess.  She will apply mupirocin to the area.  She was encouraged not to scratch or mess with this area.  She was told that if the area increases in size, becomes tender, has any tracking or evidence of infection to please call our office or go to the  emergency department.  The area was traced with a disposable permanent marker today.  If the erythema or edema travels outside of the borders of the marking she will call our office.  If the area is not completely resolved in the next  2 to 3 weeks or if gets worse she will call or return to clinic. - mupirocin cream (BACTROBAN) 2 %; Apply 1 application topically 3 (three) times daily.  Dispense: 15 g; Refill: 0 -Patient questions and reassurance given.  Return if symptoms worsen or fail to improve. Caren Macadam, MD 12/04/2017

## 2018-01-01 DIAGNOSIS — H04123 Dry eye syndrome of bilateral lacrimal glands: Secondary | ICD-10-CM | POA: Diagnosis not present

## 2018-01-01 DIAGNOSIS — H52203 Unspecified astigmatism, bilateral: Secondary | ICD-10-CM | POA: Diagnosis not present

## 2018-01-01 DIAGNOSIS — H25812 Combined forms of age-related cataract, left eye: Secondary | ICD-10-CM | POA: Diagnosis not present

## 2018-01-01 DIAGNOSIS — H35373 Puckering of macula, bilateral: Secondary | ICD-10-CM | POA: Diagnosis not present

## 2018-01-13 DIAGNOSIS — H268 Other specified cataract: Secondary | ICD-10-CM | POA: Diagnosis not present

## 2018-01-13 DIAGNOSIS — H52202 Unspecified astigmatism, left eye: Secondary | ICD-10-CM | POA: Diagnosis not present

## 2018-01-13 DIAGNOSIS — H25812 Combined forms of age-related cataract, left eye: Secondary | ICD-10-CM | POA: Diagnosis not present

## 2018-01-31 IMAGING — MR MR LUMBAR SPINE W/O CM
4 of 5 series · 15 of 48 positions shown · non-contrast
Comparison: Lumbar spine radiographs 11/20/2015

CLINICAL DATA: Low back pain radiating to the left leg.

EXAM:
MRI LUMBAR SPINE WITHOUT CONTRAST
TECHNIQUE: Multiplanar, multisequence MR imaging of the lumbar spine was
performed. No intravenous contrast was administered.

[Series 3: T2 · sagittal · 4.0mm · 0.70mm/px · 6 of 15 slices shown (1 of 2)]
[im 1/15]
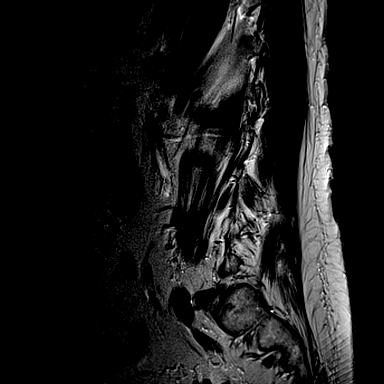
[im 3/15]
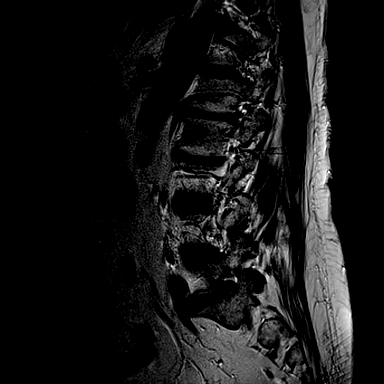
[im 6/15]
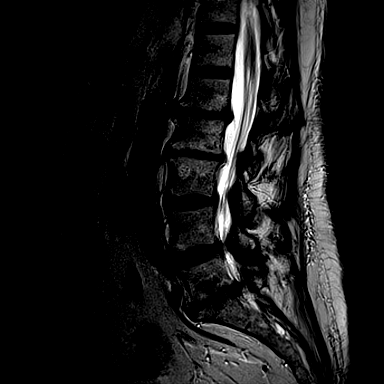
[im 9/15]
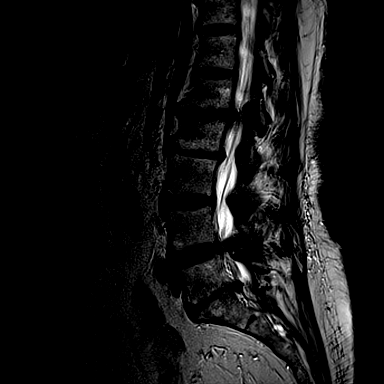
[im 12/15]
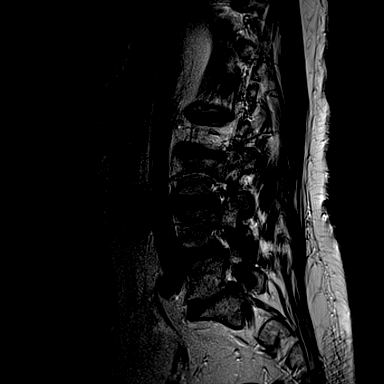
[im 15/15]
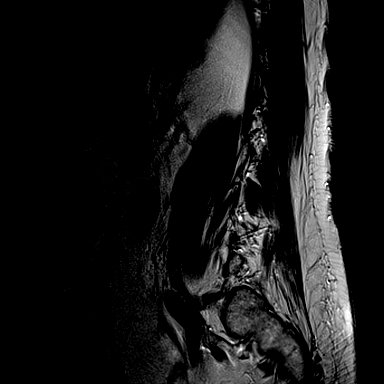

[Series 4: T1 · sagittal · 4.0mm · 0.34mm/px · 3 of 15 slices shown (1 of 2)]
[im 3/15]
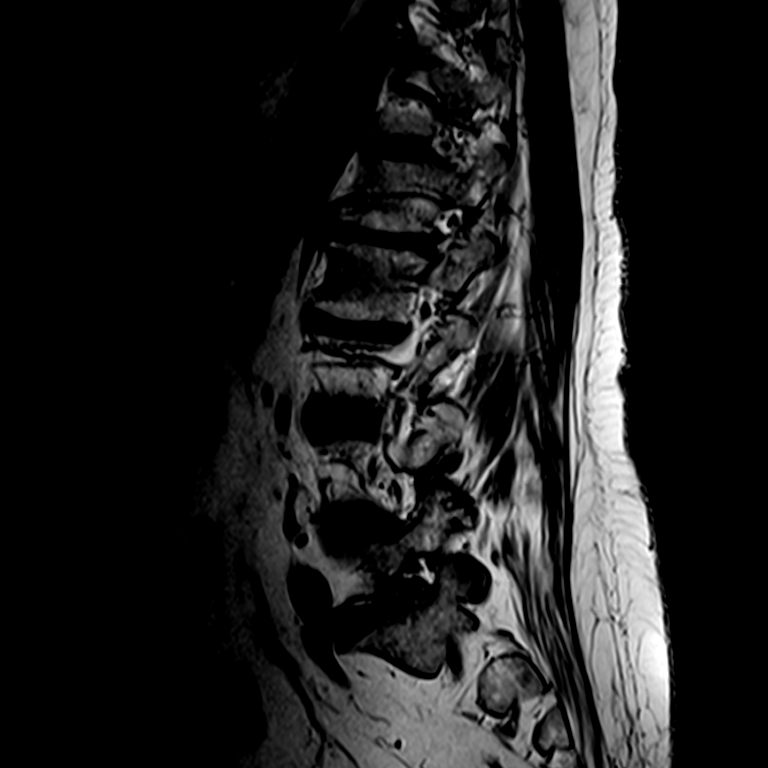
[im 9/15]
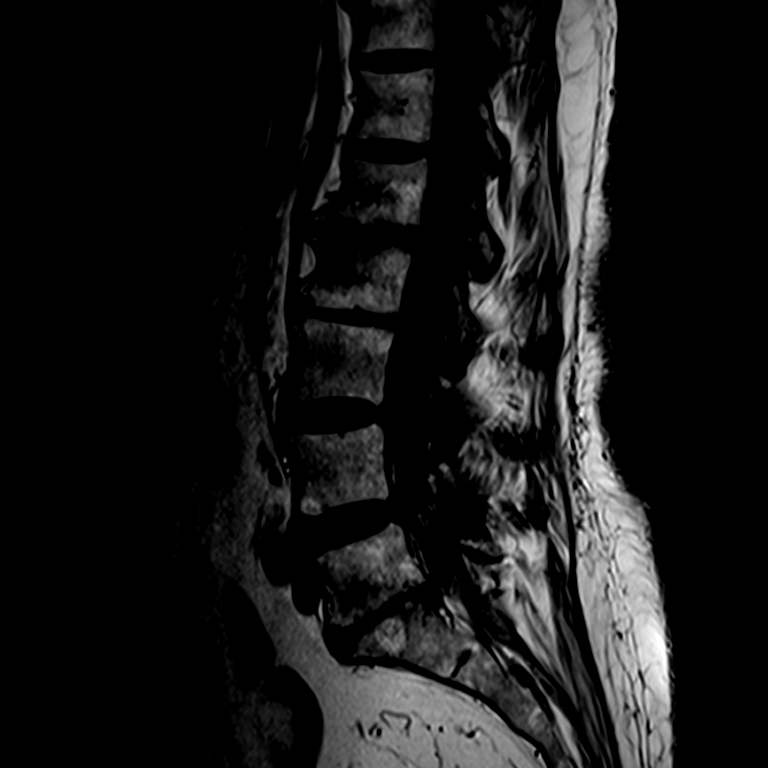
[im 15/15]
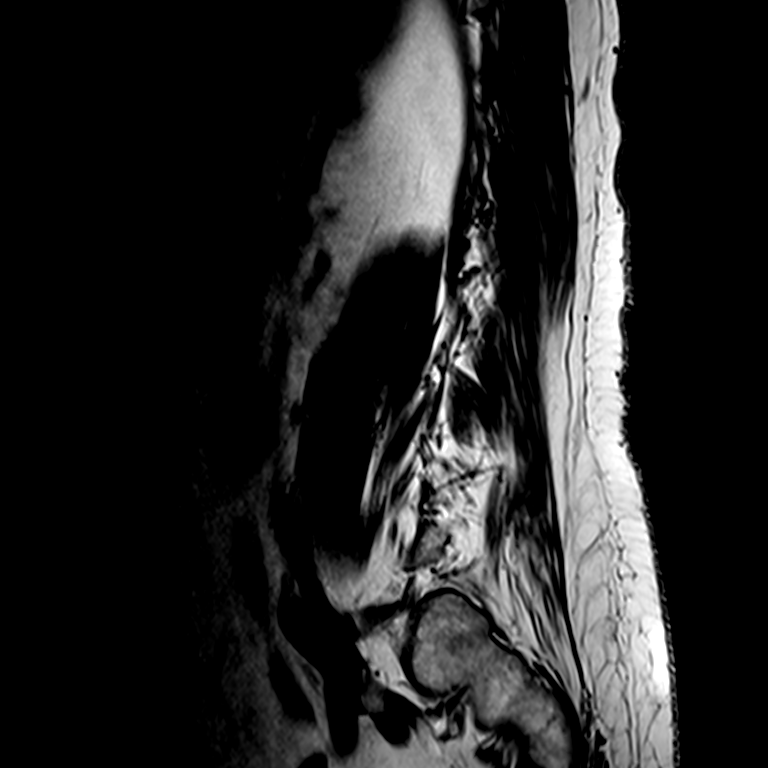

[Series 6: T2 · axial · 4.0mm · 0.23mm/px · z∈[-105,+23]mm · 3 of 37 slices shown (2 of 2)]
[im 6/37]
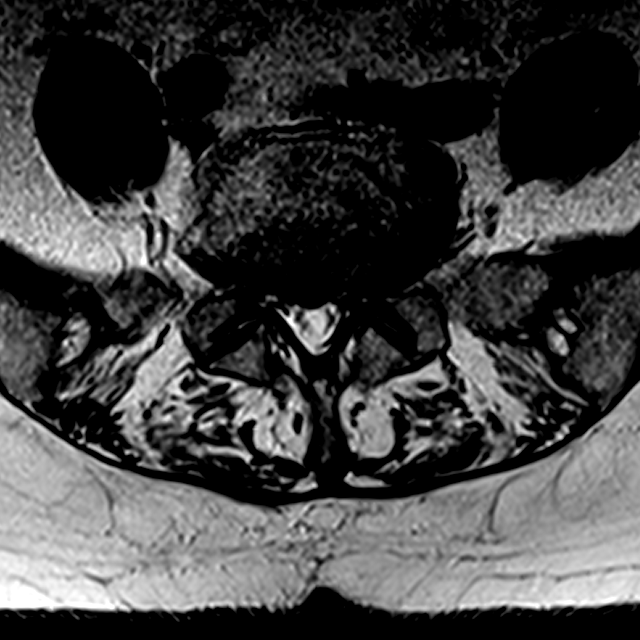
[im 19/37]
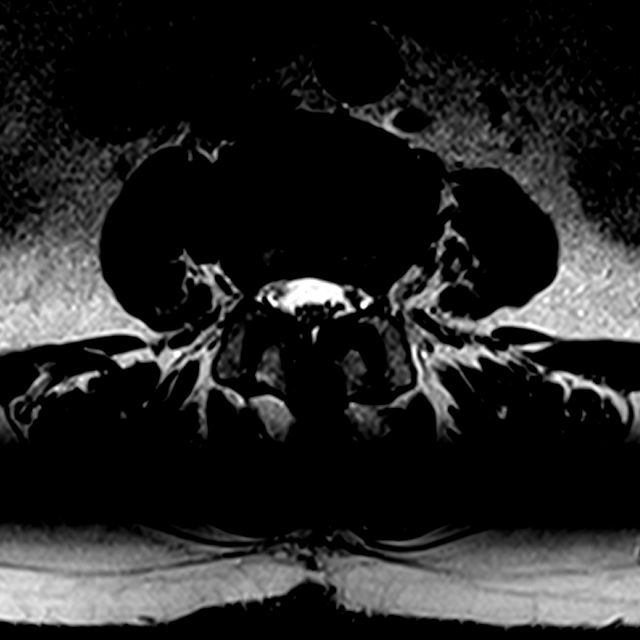
[im 31/37]
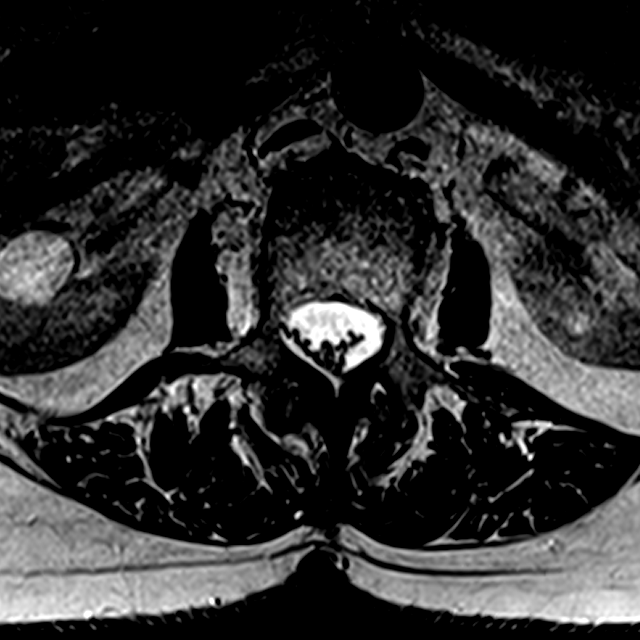

[Series 7: T1 · axial · 4.0mm · 0.24mm/px · z∈[-107,+23]mm · 3 of 37 slices shown (2 of 2)]
[im 6/37]
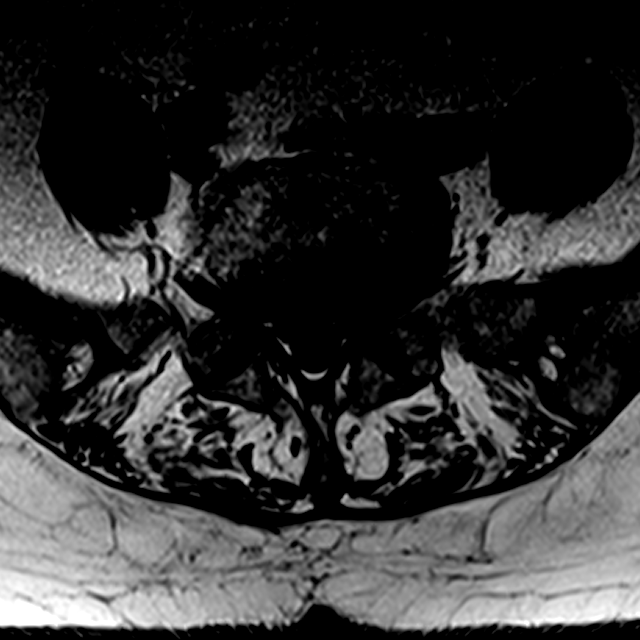
[im 19/37]
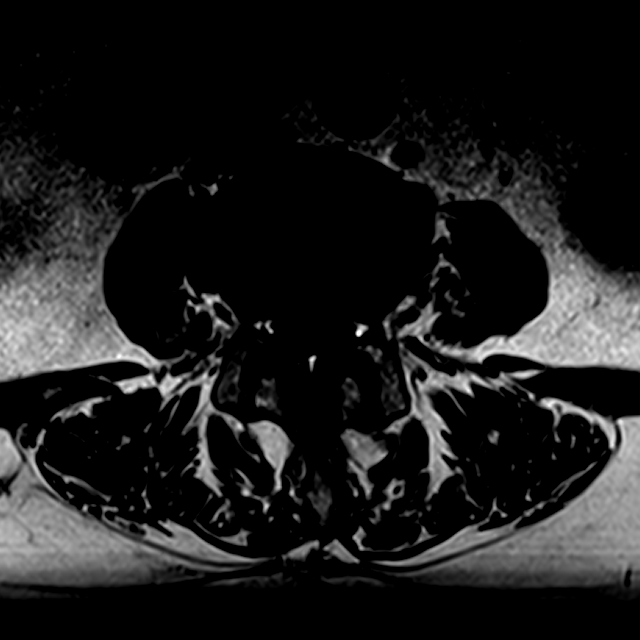
[im 31/37]
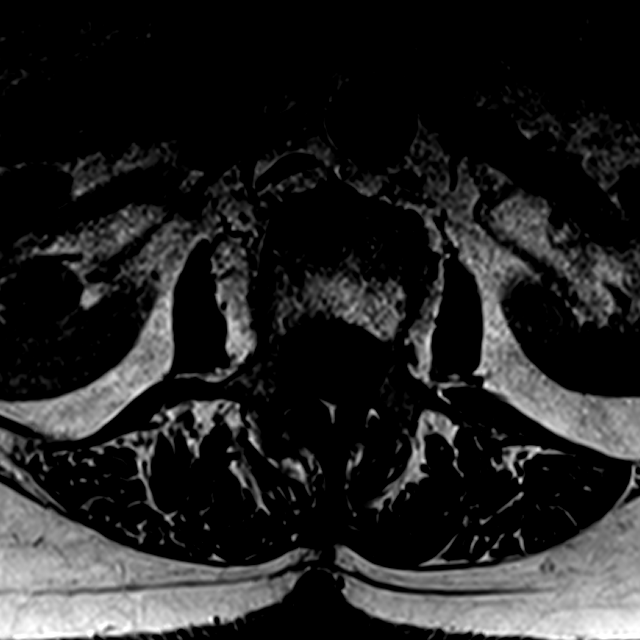

[15 of 48 positions shown; findings below may reference images not displayed]

FINDINGS: Segmentation:  Standard.

Alignment:  Minimal retrolisthesis of L5 on S1.

Vertebrae: No fracture, suspicious osseous lesion, or significant
marrow edema. Prominent degenerative endplate changes at L1-2 and
L5-S1, type 2 and type 3. Moderate type 2 changes at L2-3.

Conus medullaris: Extends to the upper L1 level and appears normal.

Paraspinal and other soft tissues: Unremarkable.

Disc levels:

Disc desiccation throughout the lumbar spine. Severe disc space
narrowing at L1-2, L2-3, and L5-S1.

L1-2: Circumferential disc bulging and mild right greater than left
facet hypertrophy result in mild bilateral neural foraminal stenosis
without spinal stenosis.

L2-3: Mild disc bulging and mild-to-moderate facet and ligamentum
flavum hypertrophy without significant stenosis.

L3-4: Minimal disc bulging, shallow bilateral foraminal disc
protrusions, and moderate facet and ligamentum flavum hypertrophy
without stenosis.

L4-5: Advanced bilateral facet arthrosis with bulky spurring and
ligamentum flavum thickening and minimal disc bulging result in
severe spinal stenosis. No significant neural foraminal stenosis.

L5-S1: Circumferential disc bulging, small central disc extrusion
with slight caudal migration, and mild facet hypertrophy result in
mild bilateral lateral recess stenosis and mild bilateral neural
foraminal stenosis without significant spinal stenosis.
IMPRESSION: 1. Severe spinal stenosis at L4-5 due to posterior element
hypertrophy.
2. Advanced disc degeneration at L1-2, L2-3, and L5-S1 without
high-grade stenosis.

## 2018-02-23 DIAGNOSIS — H35372 Puckering of macula, left eye: Secondary | ICD-10-CM | POA: Diagnosis not present

## 2018-03-02 DIAGNOSIS — H10412 Chronic giant papillary conjunctivitis, left eye: Secondary | ICD-10-CM | POA: Diagnosis not present

## 2018-03-09 DIAGNOSIS — J309 Allergic rhinitis, unspecified: Secondary | ICD-10-CM | POA: Diagnosis not present

## 2018-03-09 DIAGNOSIS — I10 Essential (primary) hypertension: Secondary | ICD-10-CM | POA: Diagnosis not present

## 2018-03-09 DIAGNOSIS — H6691 Otitis media, unspecified, right ear: Secondary | ICD-10-CM | POA: Diagnosis not present

## 2018-04-14 ENCOUNTER — Other Ambulatory Visit: Payer: Self-pay

## 2018-05-15 ENCOUNTER — Other Ambulatory Visit (HOSPITAL_COMMUNITY): Payer: Self-pay | Admitting: Family Medicine

## 2018-05-15 ENCOUNTER — Ambulatory Visit (HOSPITAL_COMMUNITY)
Admission: RE | Admit: 2018-05-15 | Discharge: 2018-05-15 | Disposition: A | Discharge status: Home / Self Care | Payer: Medicare Other | Source: Ambulatory Visit | Attending: Family Medicine | Admitting: Family Medicine

## 2018-05-15 DIAGNOSIS — J4521 Mild intermittent asthma with (acute) exacerbation: Secondary | ICD-10-CM | POA: Diagnosis not present

## 2018-05-15 DIAGNOSIS — R531 Weakness: Secondary | ICD-10-CM | POA: Diagnosis not present

## 2018-05-15 DIAGNOSIS — J9801 Acute bronchospasm: Secondary | ICD-10-CM | POA: Diagnosis not present

## 2018-05-15 DIAGNOSIS — R5383 Other fatigue: Secondary | ICD-10-CM | POA: Diagnosis not present

## 2018-05-15 DIAGNOSIS — Z23 Encounter for immunization: Secondary | ICD-10-CM | POA: Diagnosis not present

## 2018-05-15 DIAGNOSIS — I10 Essential (primary) hypertension: Secondary | ICD-10-CM | POA: Diagnosis not present

## 2018-05-15 DIAGNOSIS — R202 Paresthesia of skin: Secondary | ICD-10-CM | POA: Diagnosis not present

## 2018-05-15 DIAGNOSIS — R2681 Unsteadiness on feet: Secondary | ICD-10-CM | POA: Diagnosis not present

## 2018-05-15 DIAGNOSIS — J209 Acute bronchitis, unspecified: Secondary | ICD-10-CM | POA: Diagnosis not present

## 2018-06-11 DIAGNOSIS — Z23 Encounter for immunization: Secondary | ICD-10-CM | POA: Diagnosis not present

## 2018-06-11 DIAGNOSIS — I10 Essential (primary) hypertension: Secondary | ICD-10-CM | POA: Diagnosis not present

## 2018-06-11 DIAGNOSIS — Z87898 Personal history of other specified conditions: Secondary | ICD-10-CM | POA: Diagnosis not present

## 2018-06-11 DIAGNOSIS — K219 Gastro-esophageal reflux disease without esophagitis: Secondary | ICD-10-CM | POA: Diagnosis not present

## 2018-06-11 DIAGNOSIS — E538 Deficiency of other specified B group vitamins: Secondary | ICD-10-CM | POA: Diagnosis not present

## 2018-07-06 ENCOUNTER — Other Ambulatory Visit: Payer: Self-pay | Admitting: Family Medicine

## 2018-07-13 DIAGNOSIS — E538 Deficiency of other specified B group vitamins: Secondary | ICD-10-CM | POA: Diagnosis not present

## 2018-07-16 DIAGNOSIS — I10 Essential (primary) hypertension: Secondary | ICD-10-CM | POA: Diagnosis not present

## 2018-07-16 DIAGNOSIS — J302 Other seasonal allergic rhinitis: Secondary | ICD-10-CM | POA: Diagnosis not present

## 2018-07-16 DIAGNOSIS — Z23 Encounter for immunization: Secondary | ICD-10-CM | POA: Diagnosis not present

## 2018-07-16 DIAGNOSIS — E538 Deficiency of other specified B group vitamins: Secondary | ICD-10-CM | POA: Diagnosis not present

## 2018-07-16 DIAGNOSIS — L239 Allergic contact dermatitis, unspecified cause: Secondary | ICD-10-CM | POA: Diagnosis not present

## 2018-10-15 DIAGNOSIS — L989 Disorder of the skin and subcutaneous tissue, unspecified: Secondary | ICD-10-CM | POA: Diagnosis not present

## 2018-10-15 DIAGNOSIS — F419 Anxiety disorder, unspecified: Secondary | ICD-10-CM | POA: Diagnosis not present

## 2018-10-15 DIAGNOSIS — I10 Essential (primary) hypertension: Secondary | ICD-10-CM | POA: Diagnosis not present

## 2018-10-15 DIAGNOSIS — F320 Major depressive disorder, single episode, mild: Secondary | ICD-10-CM | POA: Diagnosis not present

## 2018-10-15 DIAGNOSIS — E538 Deficiency of other specified B group vitamins: Secondary | ICD-10-CM | POA: Diagnosis not present

## 2018-11-03 ENCOUNTER — Other Ambulatory Visit: Payer: Self-pay | Admitting: Family Medicine

## 2018-11-03 DIAGNOSIS — Z1231 Encounter for screening mammogram for malignant neoplasm of breast: Secondary | ICD-10-CM

## 2018-11-05 DIAGNOSIS — R739 Hyperglycemia, unspecified: Secondary | ICD-10-CM | POA: Diagnosis not present

## 2018-11-05 DIAGNOSIS — E538 Deficiency of other specified B group vitamins: Secondary | ICD-10-CM | POA: Diagnosis not present

## 2018-11-05 DIAGNOSIS — I10 Essential (primary) hypertension: Secondary | ICD-10-CM | POA: Diagnosis not present

## 2018-11-05 DIAGNOSIS — F320 Major depressive disorder, single episode, mild: Secondary | ICD-10-CM | POA: Diagnosis not present

## 2018-11-10 DIAGNOSIS — L309 Dermatitis, unspecified: Secondary | ICD-10-CM | POA: Diagnosis not present

## 2018-11-10 DIAGNOSIS — L821 Other seborrheic keratosis: Secondary | ICD-10-CM | POA: Diagnosis not present

## 2018-11-10 DIAGNOSIS — L570 Actinic keratosis: Secondary | ICD-10-CM | POA: Diagnosis not present

## 2018-11-10 DIAGNOSIS — L720 Epidermal cyst: Secondary | ICD-10-CM | POA: Diagnosis not present

## 2018-11-10 DIAGNOSIS — L853 Xerosis cutis: Secondary | ICD-10-CM | POA: Diagnosis not present

## 2018-11-30 ENCOUNTER — Other Ambulatory Visit: Payer: Self-pay

## 2018-11-30 ENCOUNTER — Other Ambulatory Visit: Payer: Medicare Other

## 2018-11-30 DIAGNOSIS — Z20822 Contact with and (suspected) exposure to covid-19: Secondary | ICD-10-CM

## 2018-11-30 DIAGNOSIS — R6889 Other general symptoms and signs: Secondary | ICD-10-CM | POA: Diagnosis not present

## 2018-12-04 ENCOUNTER — Ambulatory Visit
Admission: RE | Admit: 2018-12-04 | Discharge: 2018-12-04 | Disposition: A | Discharge status: Home / Self Care | Payer: Medicare Other | Source: Ambulatory Visit | Attending: Family Medicine | Admitting: Family Medicine

## 2018-12-04 ENCOUNTER — Other Ambulatory Visit: Payer: Self-pay

## 2018-12-04 DIAGNOSIS — Z1231 Encounter for screening mammogram for malignant neoplasm of breast: Secondary | ICD-10-CM

## 2018-12-05 LAB — NOVEL CORONAVIRUS, NAA: SARS-CoV-2, NAA: NOT DETECTED

## 2018-12-29 DIAGNOSIS — F419 Anxiety disorder, unspecified: Secondary | ICD-10-CM | POA: Diagnosis not present

## 2018-12-29 DIAGNOSIS — I10 Essential (primary) hypertension: Secondary | ICD-10-CM | POA: Diagnosis not present

## 2019-03-01 DIAGNOSIS — M79605 Pain in left leg: Secondary | ICD-10-CM | POA: Diagnosis not present

## 2019-03-01 DIAGNOSIS — R252 Cramp and spasm: Secondary | ICD-10-CM | POA: Diagnosis not present

## 2019-03-01 DIAGNOSIS — E669 Obesity, unspecified: Secondary | ICD-10-CM | POA: Diagnosis not present

## 2019-03-01 DIAGNOSIS — M79604 Pain in right leg: Secondary | ICD-10-CM | POA: Diagnosis not present

## 2019-03-01 DIAGNOSIS — R826 Abnormal urine levels of substances chiefly nonmedicinal as to source: Secondary | ICD-10-CM | POA: Diagnosis not present

## 2019-03-01 DIAGNOSIS — Z23 Encounter for immunization: Secondary | ICD-10-CM | POA: Diagnosis not present

## 2019-03-01 DIAGNOSIS — R7301 Impaired fasting glucose: Secondary | ICD-10-CM | POA: Diagnosis not present

## 2019-03-01 DIAGNOSIS — I10 Essential (primary) hypertension: Secondary | ICD-10-CM | POA: Diagnosis not present

## 2019-03-01 DIAGNOSIS — J302 Other seasonal allergic rhinitis: Secondary | ICD-10-CM | POA: Diagnosis not present

## 2019-03-18 ENCOUNTER — Encounter: Payer: Self-pay | Admitting: Orthopedic Surgery

## 2019-04-28 ENCOUNTER — Encounter: Payer: Self-pay | Admitting: Orthopedic Surgery

## 2019-04-28 ENCOUNTER — Ambulatory Visit (INDEPENDENT_AMBULATORY_CARE_PROVIDER_SITE_OTHER): Payer: Medicare Other | Admitting: Orthopedic Surgery

## 2019-04-28 ENCOUNTER — Other Ambulatory Visit: Payer: Self-pay

## 2019-04-28 ENCOUNTER — Ambulatory Visit: Payer: Medicare Other

## 2019-04-28 VITALS — BP 176/96 | HR 62 | Ht 65.0 in | Wt 185.0 lb

## 2019-04-28 DIAGNOSIS — M48062 Spinal stenosis, lumbar region with neurogenic claudication: Secondary | ICD-10-CM | POA: Diagnosis not present

## 2019-04-28 DIAGNOSIS — M5410 Radiculopathy, site unspecified: Secondary | ICD-10-CM | POA: Diagnosis not present

## 2019-04-28 MED ORDER — GABAPENTIN 100 MG PO CAPS: 100.0000 mg | ORAL_CAPSULE | Freq: Every day | ORAL | 3 refills | Status: DC

## 2019-04-28 NOTE — Progress Notes (Signed)
leg

## 2019-04-28 NOTE — Progress Notes (Signed)
[facility]enc[facility] Scogg[facility]  04/28/2019  Body mass index is 30.79 kg/m.   HISTORY SECTION :  Chief Complaint  Patient presents with  . Leg Pain    entire thigh bilateral are painful left worse than right    HPI The patient presents for evaluation of  (mild/moderate/severe/ ) moderate pain, in the (right /left) bilateral posterior thigh pain, for years exacerbated in the last 6 months, associated with numbness burning tingling both lower extremities.  Prior treatment gabapentin, physical therapy, chiropractic manipulation  The patient has a history of spinal stenosis diagnosed about 2 years ago had an MRI back then saw Dr.Nudelman he thought that her symptoms were coming from knee arthritis but she presents back now with neurogenic complaints worsening gait   Review of Systems  Constitutional: Positive for malaise/fatigue.  HENT: Positive for hearing loss and tinnitus.   Eyes: Positive for redness.  Gastrointestinal: Positive for constipation.     has a past medical history of Asthma, Diastolic dysfunction, GERD (gastroesophageal reflux disease), History of hiatal hernia, Hypertension, and Osteoarthritis of left knee.   Past Surgical History:  Procedure Laterality Date  . CHOLECYSTECTOMY  10/10  . COLONOSCOPY    . ESOPHAGOGASTRODUODENOSCOPY    . EYE SURGERY     cataract right  . KNEE ARTHROSCOPY WITH MEDIAL MENISECTOMY Left 06/16/2015   Procedure: KNEE ARTHROSCOPY WITH PARTIAL MEDIAL MENISECTOMY;  Surgeon: Carole Civil, MD;  Location: [facility];  Service: Orthopedics;  Laterality: Left;  . PARTIAL KNEE ARTHROPLASTY Left 07/08/2017   Procedure: UNICOMPARTMENTAL KNEE;  Surgeon: Renette Butters, MD;  Location: [place];  Service: Orthopedics;  Laterality: Left;    Body mass index is 30.79 kg/m.   No Known Allergies   Current Outpatient Medications:  .  aspirin EC 81 MG tablet, Take 1 tablet (81 mg total) by mouth daily., Disp: , Rfl:  .  Cholecalciferol (VITAMIN D3 PO), Take 1  tablet by mouth daily., Disp: , Rfl:  .  Cyanocobalamin (VITAMIN B-12 PO), Take 1 tablet by mouth daily., Disp: , Rfl:  .  KRILL OIL PO, Take 1 capsule by mouth daily., Disp: , Rfl:  .  Magnesium 500 MG CAPS, Take 500 mg by mouth at bedtime. , Disp: , Rfl:  .  metoprolol succinate (TOPROL-XL) 25 MG 24 hr tablet, TAKE (1) TABLET BY MOUTH ONCE DAILY., Disp: 90 tablet, Rfl: 3 .  Multiple Vitamins-Minerals (ZINC PO), Take 1 tablet by mouth daily as needed (for immune health/onset of cold)., Disp: , Rfl:  .  omeprazole (PRILOSEC OTC) 20 MG tablet, Take 20 mg by mouth at bedtime., Disp: , Rfl:  .  gabapentin (NEURONTIN) 100 MG capsule, Take 1 capsule (100 mg total) by mouth at bedtime., Disp: 30 capsule, Rfl: 3   PHYSICAL EXAM SECTION: 1) BP (!) 176/96   Pulse 62   Ht 5\' 5"  (1.651 m)   Wt 185 lb (83.9 kg)   BMI 30.79 kg/m   Body mass index is 30.79 kg/m. General appearance: Well-developed well-nourished no gross deformities  2) Cardiovascular color without edema both legs  4) Psychological: Awake alert and oriented x3 mood and affect normal  5) Skin no lacerations or ulcerations no nodularity no palpable masses, no erythema or nodularity  6) Musculoskeletal: Gait seems normal   MEDICAL DECISION SECTION:  Encounter Diagnoses  Name Primary?  . Radicular pain of left lower extremity   . Radicular pain of right lower extremity   . Spinal stenosis of lumbar region with neurogenic claudication  Yes    Imaging Repeat imaging of the spine shows multilevels of degenerative disc disease scoliosis spondylosis  Her MRI from 2018 Disc desiccation throughout the lumbar spine. Severe disc space narrowing at L1-2, L2-3, and L5-S1.   L1-2: Circumferential disc bulging and mild right greater than left facet hypertrophy result in mild bilateral neural foraminal stenosis without spinal stenosis.   L2-3: Mild disc bulging and mild-to-moderate facet and ligamentum flavum hypertrophy without  significant stenosis.   L3-4: Minimal disc bulging, shallow bilateral foraminal disc protrusions, and moderate facet and ligamentum flavum hypertrophy without stenosis.   L4-5: Advanced bilateral facet arthrosis with bulky spurring and ligamentum flavum thickening and minimal disc bulging result in severe spinal stenosis. No significant neural foraminal stenosis.   L5-S1: Circumferential disc bulging, small central disc extrusion with slight caudal migration, and mild facet hypertrophy result in mild bilateral lateral recess stenosis and mild bilateral neural foraminal stenosis without significant spinal stenosis.   IMPRESSION: 1. Severe spinal stenosis at L4-5 due to posterior element hypertrophy. 2. Advanced disc degeneration at L1-2, L2-3, and L5-S1 without high-grade stenosis.     Electronically Signed   By: Logan Bores M.D.   On: 12/24/2016 16:00   Plan:  (Rx., Inj., surg., Frx, MRI/CT, XR:2) Recommend she see Dr. Sherwood Gambler  I did start her on gabapentin 100 mg nightly  Meds ordered this encounter  Medications  . gabapentin (NEURONTIN) 100 MG capsule    Sig: Take 1 capsule (100 mg total) by mouth at bedtime.    Dispense:  30 capsule    Refill:  3      10:58 AM Arther Abbott, MD  04/28/2019

## 2019-04-28 NOTE — Patient Instructions (Addendum)
Start gabapentin 100 mg at night   See Dr Astrid Drafts    Spinal Stenosis  Spinal stenosis occurs when the open space (spinal canal) between the bones of your spine (vertebrae) narrows, putting pressure on the spinal cord or nerves. What are the causes? This condition is caused by areas of bone pushing into the central canals of your vertebrae. This condition may be present at birth (congenital), or it may be caused by:  Arthritic deterioration of your vertebrae (spinal degeneration). This usually starts around age 79.  Injury or trauma to the spine.  Tumors in the spine.  Calcium deposits in the spine. What are the signs or symptoms? Symptoms of this condition include:  Pain in the neck or back that is generally worse with activities, particularly when standing and walking.  Numbness, tingling, hot or cold sensations, weakness, or weariness in your legs.  Pain going up and down the leg (sciatica).  Frequent episodes of falling.  A foot-slapping gait that leads to muscle weakness. In more serious cases, you may develop:  Problemspassing stool or passing urine.  Difficulty having sex.  Loss of feeling in part or all of your leg. Symptoms may come on slowly and get worse over time. How is this diagnosed? This condition is diagnosed based on your medical history and a physical exam. Tests will also be done, such as:  MRI.  CT scan.  X-ray. How is this treated? Treatment for this condition often focuses on managing your pain and any other symptoms. Treatment may include:  Practicing good posture to lessen pressure on your nerves.  Exercising to strengthen muscles, build endurance, improve balance, and maintain good joint movement (range of motion).  Losing weight, if needed.  Taking medicines to reduce swelling, inflammation, or pain.  Assistive devices, such as a corset or brace. In some cases, surgery may be needed. The most common procedure is decompression  laminectomy. This is done to remove excess bone that puts pressure on your nerve roots. Follow these instructions at home: Managing pain, stiffness, and swelling  Do all exercises and stretches as told by your health care provider.  Practice good posture. If you were given a brace or a corset, wear it as told by your health care provider.  Do not do any activities that cause pain. Ask your health care provider what activities are safe for you.  Do not lift anything that is heavier than 10 lb (4.5 kg) or the limit that your health care provider tells you.  Maintain a healthy weight. Talk with your health care provider if you need help losing weight.  If directed, apply heat to the affected area as often as told by your health care provider. Use the heat source that your health care provider recommends, such as a moist heat pack or a heating pad. ? Place a towel between your skin and the heat source. ? Leave the heat on for 20-30 minutes. ? Remove the heat if your skin turns bright red. This is especially important if you are not able to feel pain, heat, or cold. You may have a greater risk of getting burned. General instructions  Take over-the-counter and prescription medicines only as told by your health care provider.  Do not use any products that contain nicotine or tobacco, such as cigarettes and e-cigarettes. If you need help quitting, ask your health care provider.  Eat a healthy diet. This includes plenty of fruits and vegetables, whole grains, and low-fat (lean) protein.  Keep  all follow-up visits as told by your health care provider. This is important. Contact a health care provider if:  Your symptoms do not get better or they get worse.  You have a fever. Get help right away if:  You have new or worse pain in your neck or upper back.  You have severe pain that cannot be controlled with medicines.  You are dizzy.  You have vision problems, blurred vision, or double  vision.  You have a severe headache that is worse when you stand.  You have nausea or you vomit.  You develop new or worse numbness or tingling in your back or legs.  You have pain, redness, swelling, or warmth in your arm or leg. Summary  Spinal stenosis occurs when the open space (spinal canal) between the bones of your spine (vertebrae) narrows. This narrowing puts pressure on the spinal cord or nerves.  Spinal stenosis can cause numbness, weakness, or pain in the neck, back, and legs.  This condition may be caused by a birth defect, arthritic deterioration of your vertebrae, injury, tumors, or calcium deposits.  This condition is usually diagnosed with MRIs, CT scans, and X-rays. This information is not intended to replace advice given to you by your health care provider. Make sure you discuss any questions you have with your health care provider. Document Released: 08/03/2003 Document Revised: 04/25/2017 Document Reviewed: 04/17/2016 Elsevier Patient Education  2020 [employer].

## 2019-05-31 ENCOUNTER — Telehealth: Payer: Self-pay

## 2019-05-31 NOTE — Telephone Encounter (Signed)
Girl from Dr. Romona Curls office called stating that she had tried to call this patient and can't get in contact with her to schedule an appointment.

## 2019-06-01 NOTE — Telephone Encounter (Signed)
We did not refer to Dr Ernestina Patches, referred to Dr Sherwood Gambler  Hopefully this is the person who called, thanks.

## 2019-06-14 ENCOUNTER — Telehealth: Payer: Self-pay | Admitting: Orthopedic Surgery

## 2019-06-14 NOTE — Telephone Encounter (Signed)
Call received from [place] at [facility], regarding patient's referral, to relay that appointment was offered, however, patient declined.

## 2019-06-28 DIAGNOSIS — Z1159 Encounter for screening for other viral diseases: Secondary | ICD-10-CM | POA: Diagnosis not present

## 2019-06-28 DIAGNOSIS — Z1231 Encounter for screening mammogram for malignant neoplasm of breast: Secondary | ICD-10-CM | POA: Diagnosis not present

## 2019-06-28 DIAGNOSIS — Z1211 Encounter for screening for malignant neoplasm of colon: Secondary | ICD-10-CM | POA: Diagnosis not present

## 2019-06-28 DIAGNOSIS — Z0000 Encounter for general adult medical examination without abnormal findings: Secondary | ICD-10-CM | POA: Diagnosis not present

## 2019-06-28 DIAGNOSIS — Z87898 Personal history of other specified conditions: Secondary | ICD-10-CM | POA: Diagnosis not present

## 2019-06-28 DIAGNOSIS — E2839 Other primary ovarian failure: Secondary | ICD-10-CM | POA: Diagnosis not present

## 2019-06-28 DIAGNOSIS — E663 Overweight: Secondary | ICD-10-CM | POA: Diagnosis not present

## 2019-06-28 DIAGNOSIS — R7303 Prediabetes: Secondary | ICD-10-CM | POA: Diagnosis not present

## 2019-06-28 DIAGNOSIS — I10 Essential (primary) hypertension: Secondary | ICD-10-CM | POA: Diagnosis not present

## 2019-06-29 ENCOUNTER — Other Ambulatory Visit: Payer: Self-pay | Admitting: Family Medicine

## 2019-06-29 DIAGNOSIS — E2839 Other primary ovarian failure: Secondary | ICD-10-CM

## 2019-07-08 DIAGNOSIS — Z23 Encounter for immunization: Secondary | ICD-10-CM | POA: Diagnosis not present

## 2019-08-06 DIAGNOSIS — Z23 Encounter for immunization: Secondary | ICD-10-CM | POA: Diagnosis not present

## 2019-08-12 ENCOUNTER — Other Ambulatory Visit: Payer: Self-pay | Admitting: Family Medicine

## 2019-08-12 DIAGNOSIS — Z1231 Encounter for screening mammogram for malignant neoplasm of breast: Secondary | ICD-10-CM

## 2019-08-25 DIAGNOSIS — M25551 Pain in right hip: Secondary | ICD-10-CM | POA: Diagnosis not present

## 2019-09-21 DIAGNOSIS — R531 Weakness: Secondary | ICD-10-CM | POA: Diagnosis not present

## 2019-09-21 DIAGNOSIS — M79652 Pain in left thigh: Secondary | ICD-10-CM | POA: Diagnosis not present

## 2019-09-21 DIAGNOSIS — M545 Low back pain: Secondary | ICD-10-CM | POA: Diagnosis not present

## 2019-09-27 DIAGNOSIS — M79652 Pain in left thigh: Secondary | ICD-10-CM | POA: Diagnosis not present

## 2019-09-27 DIAGNOSIS — M545 Low back pain: Secondary | ICD-10-CM | POA: Diagnosis not present

## 2019-09-27 DIAGNOSIS — R531 Weakness: Secondary | ICD-10-CM | POA: Diagnosis not present

## 2019-09-29 DIAGNOSIS — M545 Low back pain: Secondary | ICD-10-CM | POA: Diagnosis not present

## 2019-09-30 DIAGNOSIS — R531 Weakness: Secondary | ICD-10-CM | POA: Diagnosis not present

## 2019-09-30 DIAGNOSIS — M79652 Pain in left thigh: Secondary | ICD-10-CM | POA: Diagnosis not present

## 2019-09-30 DIAGNOSIS — M545 Low back pain: Secondary | ICD-10-CM | POA: Diagnosis not present

## 2019-10-04 DIAGNOSIS — M545 Low back pain: Secondary | ICD-10-CM | POA: Diagnosis not present

## 2019-10-04 DIAGNOSIS — R531 Weakness: Secondary | ICD-10-CM | POA: Diagnosis not present

## 2019-10-04 DIAGNOSIS — M79652 Pain in left thigh: Secondary | ICD-10-CM | POA: Diagnosis not present

## 2019-10-07 DIAGNOSIS — R531 Weakness: Secondary | ICD-10-CM | POA: Diagnosis not present

## 2019-10-07 DIAGNOSIS — M79652 Pain in left thigh: Secondary | ICD-10-CM | POA: Diagnosis not present

## 2019-10-07 DIAGNOSIS — M545 Low back pain: Secondary | ICD-10-CM | POA: Diagnosis not present

## 2019-10-11 DIAGNOSIS — M545 Low back pain: Secondary | ICD-10-CM | POA: Diagnosis not present

## 2019-10-11 DIAGNOSIS — R531 Weakness: Secondary | ICD-10-CM | POA: Diagnosis not present

## 2019-10-11 DIAGNOSIS — M79652 Pain in left thigh: Secondary | ICD-10-CM | POA: Diagnosis not present

## 2019-10-14 DIAGNOSIS — M545 Low back pain: Secondary | ICD-10-CM | POA: Diagnosis not present

## 2019-10-14 DIAGNOSIS — M79652 Pain in left thigh: Secondary | ICD-10-CM | POA: Diagnosis not present

## 2019-10-14 DIAGNOSIS — R531 Weakness: Secondary | ICD-10-CM | POA: Diagnosis not present

## 2019-10-18 DIAGNOSIS — M545 Low back pain: Secondary | ICD-10-CM | POA: Diagnosis not present

## 2019-10-18 DIAGNOSIS — R531 Weakness: Secondary | ICD-10-CM | POA: Diagnosis not present

## 2019-10-18 DIAGNOSIS — M79652 Pain in left thigh: Secondary | ICD-10-CM | POA: Diagnosis not present

## 2019-10-20 DIAGNOSIS — M545 Low back pain: Secondary | ICD-10-CM | POA: Diagnosis not present

## 2019-10-20 DIAGNOSIS — R531 Weakness: Secondary | ICD-10-CM | POA: Diagnosis not present

## 2019-10-20 DIAGNOSIS — M79652 Pain in left thigh: Secondary | ICD-10-CM | POA: Diagnosis not present

## 2019-12-06 ENCOUNTER — Other Ambulatory Visit: Payer: Self-pay

## 2019-12-06 ENCOUNTER — Ambulatory Visit
Admission: RE | Admit: 2019-12-06 | Discharge: 2019-12-06 | Disposition: A | Discharge status: Home / Self Care | Payer: Medicare Other | Source: Ambulatory Visit | Attending: Family Medicine | Admitting: Family Medicine

## 2019-12-06 DIAGNOSIS — E2839 Other primary ovarian failure: Secondary | ICD-10-CM

## 2019-12-06 DIAGNOSIS — Z1231 Encounter for screening mammogram for malignant neoplasm of breast: Secondary | ICD-10-CM

## 2020-02-03 DIAGNOSIS — J40 Bronchitis, not specified as acute or chronic: Secondary | ICD-10-CM | POA: Diagnosis not present

## 2020-02-03 DIAGNOSIS — H9190 Unspecified hearing loss, unspecified ear: Secondary | ICD-10-CM | POA: Diagnosis not present

## 2020-02-03 DIAGNOSIS — M79652 Pain in left thigh: Secondary | ICD-10-CM | POA: Diagnosis not present

## 2020-02-03 DIAGNOSIS — R7303 Prediabetes: Secondary | ICD-10-CM | POA: Diagnosis not present

## 2020-02-03 DIAGNOSIS — J302 Other seasonal allergic rhinitis: Secondary | ICD-10-CM | POA: Diagnosis not present

## 2020-02-03 DIAGNOSIS — R0600 Dyspnea, unspecified: Secondary | ICD-10-CM | POA: Diagnosis not present

## 2020-02-03 DIAGNOSIS — E7800 Pure hypercholesterolemia, unspecified: Secondary | ICD-10-CM | POA: Diagnosis not present

## 2020-02-03 DIAGNOSIS — I10 Essential (primary) hypertension: Secondary | ICD-10-CM | POA: Diagnosis not present

## 2020-02-03 DIAGNOSIS — Z23 Encounter for immunization: Secondary | ICD-10-CM | POA: Diagnosis not present

## 2020-02-03 DIAGNOSIS — Z1159 Encounter for screening for other viral diseases: Secondary | ICD-10-CM | POA: Diagnosis not present

## 2020-02-03 DIAGNOSIS — Z1231 Encounter for screening mammogram for malignant neoplasm of breast: Secondary | ICD-10-CM | POA: Diagnosis not present

## 2020-02-23 DIAGNOSIS — D225 Melanocytic nevi of trunk: Secondary | ICD-10-CM | POA: Diagnosis not present

## 2020-02-23 DIAGNOSIS — L570 Actinic keratosis: Secondary | ICD-10-CM | POA: Diagnosis not present

## 2020-02-23 DIAGNOSIS — L821 Other seborrheic keratosis: Secondary | ICD-10-CM | POA: Diagnosis not present

## 2020-02-23 DIAGNOSIS — D1721 Benign lipomatous neoplasm of skin and subcutaneous tissue of right arm: Secondary | ICD-10-CM | POA: Diagnosis not present

## 2020-02-23 DIAGNOSIS — L820 Inflamed seborrheic keratosis: Secondary | ICD-10-CM | POA: Diagnosis not present

## 2020-02-23 DIAGNOSIS — L814 Other melanin hyperpigmentation: Secondary | ICD-10-CM | POA: Diagnosis not present

## 2020-02-23 DIAGNOSIS — L578 Other skin changes due to chronic exposure to nonionizing radiation: Secondary | ICD-10-CM | POA: Diagnosis not present

## 2020-03-01 ENCOUNTER — Other Ambulatory Visit: Payer: Self-pay | Admitting: Family Medicine

## 2020-03-01 ENCOUNTER — Ambulatory Visit
Admission: RE | Admit: 2020-03-01 | Discharge: 2020-03-01 | Disposition: A | Discharge status: Home / Self Care | Payer: Medicare Other | Source: Ambulatory Visit | Attending: Family Medicine | Admitting: Family Medicine

## 2020-03-01 DIAGNOSIS — R0609 Other forms of dyspnea: Secondary | ICD-10-CM

## 2020-03-03 DIAGNOSIS — J4521 Mild intermittent asthma with (acute) exacerbation: Secondary | ICD-10-CM | POA: Diagnosis not present

## 2020-03-03 DIAGNOSIS — E7800 Pure hypercholesterolemia, unspecified: Secondary | ICD-10-CM | POA: Diagnosis not present

## 2020-03-03 DIAGNOSIS — R7303 Prediabetes: Secondary | ICD-10-CM | POA: Diagnosis not present

## 2020-03-03 DIAGNOSIS — I10 Essential (primary) hypertension: Secondary | ICD-10-CM | POA: Diagnosis not present

## 2020-03-03 DIAGNOSIS — M79652 Pain in left thigh: Secondary | ICD-10-CM | POA: Diagnosis not present

## 2020-03-08 ENCOUNTER — Encounter: Payer: Self-pay | Admitting: Physical Medicine and Rehabilitation

## 2020-03-08 DIAGNOSIS — H838X3 Other specified diseases of inner ear, bilateral: Secondary | ICD-10-CM | POA: Diagnosis not present

## 2020-03-08 DIAGNOSIS — H903 Sensorineural hearing loss, bilateral: Secondary | ICD-10-CM | POA: Diagnosis not present

## 2020-03-29 DIAGNOSIS — Z20822 Contact with and (suspected) exposure to covid-19: Secondary | ICD-10-CM | POA: Diagnosis not present

## 2020-04-03 ENCOUNTER — Other Ambulatory Visit: Payer: Self-pay

## 2020-04-03 ENCOUNTER — Encounter: Payer: Self-pay | Admitting: Physical Medicine and Rehabilitation

## 2020-04-03 ENCOUNTER — Encounter
Payer: Medicare Other | Attending: Physical Medicine and Rehabilitation | Admitting: Physical Medicine and Rehabilitation

## 2020-04-03 VITALS — BP 146/80 | HR 78 | Temp 98.2°F | Ht 65.0 in | Wt 185.0 lb

## 2020-04-03 DIAGNOSIS — M48062 Spinal stenosis, lumbar region with neurogenic claudication: Secondary | ICD-10-CM | POA: Insufficient documentation

## 2020-04-03 DIAGNOSIS — M5116 Intervertebral disc disorders with radiculopathy, lumbar region: Secondary | ICD-10-CM

## 2020-04-03 DIAGNOSIS — M792 Neuralgia and neuritis, unspecified: Secondary | ICD-10-CM | POA: Insufficient documentation

## 2020-04-03 MED ORDER — DULOXETINE HCL 30 MG PO CPEP: 30.0000 mg | ORAL_CAPSULE | Freq: Every day | ORAL | 3 refills | Status: DC

## 2020-04-03 NOTE — Progress Notes (Signed)
Subjective:    Patient ID: Laura Carter, female    DOB: 11-11-1940, 79 y.o.   MRN: [id number]  HPI   Patient is an 79 yr old female with hx of asthma, HTN, GERD, here for chronic B/L thigh/buttock pain. Here for evaluation.   Told has stenosis of Lumbar spine and sciatica.  Pain goes down to knees- not to feet.  Buttocks down to knees.   When it started- 2 years ago- something  Popped and muscle tightened on L side. The R side started more of an issue- within the 2 year times frame, particularly when tired- a little "limpy" - starts on R buttocks-   Worse with - carrying something heavy- shifting something around/heavy, sitting on soft sofa for long time.  Also when leans forward, will relieve.   Does massage 1x/week, yoga- 1x/week; and does chiropractor and accupuncture every other week- alternating, and reiki  Weekly.   Had a partial Knee replacement on Left- in past.  Tried dry needling- helped a lot.  Took 3 hours/day- reached insurance point with this.   Also gets cramps in legs at night.  Also has nerve pain- burning from B/L knees to ankles B/L  Meds tried: Gabapentin -took a couple times- made goofy- quit Vitamins- Tumeric- just started- 538 mg 1x/day Takes cinnamon 1/4 tsp daily 2-3x/week takes extra strength tylenol Also glucosamine Vit B complex Zinc Vit D Vit C CoQ10- just restarted.  Magnesium 400 mg - not every day Flax seed- and aloe vera St john's wort BID.   Pain Inventory Average Pain 7 Pain Right Now 7 My pain is aching  In the last 24 hours, has pain interfered with the following? General activity 6 Relation with others 1 Enjoyment of life 7 What TIME of day is your pain at its worst? evening Sleep (in general) Good  Pain is worse with: standing Pain improves with: rest and therapy/exercise Relief from Meds: 2  walk without assistance ability to climb steps?  no do you drive?  no  what is your job? .  trouble  walking  New pt  New pt    Family History  Problem Relation Age of Onset  . Cancer Mother        breast  . Stomach cancer Mother   . Breast cancer Mother   . Heart disease Father   . Heart disease Sister   . Breast cancer Sister    Social History   Socioeconomic History  . Marital status: Married    Spouse name: Not on file  . Number of children: 2  . Years of education: Not on file  . Highest education level: 10th grade  Occupational History  . Not on file  Tobacco Use  . Smoking status: Former Smoker    Packs/day: 1.00    Years: 10.00    Pack years: 10.00    Types: Cigarettes    Quit date: 06/13/1965    Years since quitting: 54.8  . Smokeless tobacco: Never Used  Vaping Use  . Vaping Use: Never used  Substance and Sexual Activity  . Alcohol use: No  . Drug use: No  . Sexual activity: Never    Birth control/protection: None  Other Topics Concern  . Not on file  Social History Narrative   Grew up in [place], [place]. Married. Has one daughter and one son. Has two grandchildren.    Did not work outside the home.   Eats all food groups.  Wear seatbelt.    Went to [employer] and graduated from [employer] in [place].    Social Determinants of Health   Financial Resource Strain:   . Difficulty of Paying Living Expenses: Not on file  Food Insecurity:   . Worried About Charity fundraiser in the Last Year: Not on file  . Ran Out of Food in the Last Year: Not on file  Transportation Needs:   . Lack of Transportation (Medical): Not on file  . Lack of Transportation (Non-Medical): Not on file  Physical Activity:   . Days of Exercise per Week: Not on file  . Minutes of Exercise per Session: Not on file  Stress:   . Feeling of Stress : Not on file  Social Connections:   . Frequency of Communication with Friends and Family: Not on file  . Frequency of Social Gatherings with Friends and Family: Not on file  . Attends Religious Services: Not on file   . Active Member of Clubs or Organizations: Not on file  . Attends Archivist Meetings: Not on file  . Marital Status: Not on file   Past Surgical History:  Procedure Laterality Date  . BREAST BIOPSY Left    years ago- no scar ? needle biopsy only  . CHOLECYSTECTOMY  10/10  . COLONOSCOPY    . ESOPHAGOGASTRODUODENOSCOPY    . EYE SURGERY     cataract right  . KNEE ARTHROSCOPY WITH MEDIAL MENISECTOMY Left 06/16/2015   Procedure: KNEE ARTHROSCOPY WITH PARTIAL MEDIAL MENISECTOMY;  Surgeon: Carole Civil, MD;  Location: [facility];  Service: Orthopedics;  Laterality: Left;  . PARTIAL KNEE ARTHROPLASTY Left 07/08/2017   Procedure: UNICOMPARTMENTAL KNEE;  Surgeon: Renette Butters, MD;  Location: [place];  Service: Orthopedics;  Laterality: Left;   Past Medical History:  Diagnosis Date  . Asthma   . Diastolic dysfunction   . GERD (gastroesophageal reflux disease)   . History of hiatal hernia   . Hypertension   . Osteoarthritis of left knee    BP (!) 146/80   Pulse 78   Temp 98.2 F (36.8 C)   Ht 5\' 5"  (1.651 m)   Wt 185 lb (83.9 kg)   SpO2 96%   BMI 30.79 kg/m   Opioid Risk Score:   Fall Risk Score:  `1  Depression screen PHQ 2/9  Depression screen [facility] 2/9 04/03/2020 12/04/2017 11/18/2017 06/10/2017 05/31/2015 05/23/2014  Decreased Interest 0 0 0 0 0 0  Down, Depressed, Hopeless 0 0 0 0 0 0  PHQ - 2 Score 0 0 0 0 0 0  Altered sleeping 0 - - - - -  Tired, decreased energy 0 - - - - -  Change in appetite 0 - - - - -  Feeling bad or failure about yourself  0 - - - - -  Trouble concentrating 0 - - - - -  Moving slowly or fidgety/restless 0 - - - - -  Suicidal thoughts 0 - - - - -  PHQ-9 Score 0 - - - - -  Difficult doing work/chores Not difficult at all - - - - -     Review of Systems  Musculoskeletal:       Leg pain  All other systems reviewed and are negative.      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 Here for evaluation.  Severe spinal stenosis since 2018- with associated nerve pain.   1. Tumeric 500 mg 2x/day.   2. Continue Cinnamon 1/4 teaspoon every day. And other vitamins.   3.  Magnesium 400 mg at least 1x/day- can also take up to 2x/day. Probably need to stop flax seed so not too loose stools.     4.  Duloxetine /Cymbalta 30 mg nightly x 1 week  Then 60 mg nightly- for nerve pain  1% of patients can have nausea with Duloxetine- call me if needs an anti-nausea medicine. Can also cause mild dry mouth/dry eyes and mild constipation.   5. Biotene- can also helps dry mouth   6. Doesn't need hip replacement.  7.   Could help tennis ball- sit on it- 10-15 minutes daily- for 30 days and then as needed.      8. F/U in 6 weeks-  Wait on steroid injections.   I spent a total of 45 minutes on visit- as detailed above.

## 2020-04-03 NOTE — Patient Instructions (Signed)
Patient is an 79 yr old female with hx of asthma, HTN, GERD, here for chronic B/L thigh/buttock pain. Here for evaluation.  Severe spinal stenosis since 2018- with associated nerve pain.   1. Tumeric 500 mg 2x/day.   2. Continue Cinnamon 1/4 teaspoon every day. And other vitamins.   3.  Magnesium 400 mg at least 1x/day- can also take up to 2x/day. Probably need to stop flax seed so not too loose stools.     4.  Duloxetine /Cymbalta 30 mg nightly x 1 week  Then 60 mg nightly- for nerve pain  1% of patients can have nausea with Duloxetine- call me if needs an anti-nausea medicine. Can also cause mild dry mouth/dry eyes and mild constipation.   5. Biotene- can also helps dry mouth   6. Doesn't need hip replacement.  7.   Could help tennis ball- sit on it- 10-15 minutes daily- for 30 days and then as needed.      8. F/U in 6 weeks-  Wait on steroid injections.

## 2020-04-15 DIAGNOSIS — Z23 Encounter for immunization: Secondary | ICD-10-CM | POA: Diagnosis not present

## 2020-04-19 ENCOUNTER — Telehealth: Payer: Self-pay

## 2020-04-19 NOTE — Telephone Encounter (Signed)
Mrs. Strack had to stop taking Cymbalta 2 days ago. She noticed a decreased urine stream and urine flow rate. Since stopping the Cymbalta she is back to a normal out put. (Patient remembered it may cause dry eyes & dry mouth).   Please advise. Thank you. Call back ph [phone] Okay to leave a message per patient.

## 2020-04-19 NOTE — Telephone Encounter (Signed)
I understand About the Cymbalta- to stop it due to side effects.   Pt hasn't tried Lyrica- CAN try it, or Keppra if doesn't want to try Lyrica.   Not sure how well pt doing otherwise- have her let me know.   I have L/M on her voicemail detailing as above and explained Cymbalta in RARE cases can cause urinary retention.   Please have her let me know the plan, if we cannot speak in person.

## 2020-04-24 ENCOUNTER — Telehealth: Payer: Self-pay

## 2020-04-24 NOTE — Telephone Encounter (Signed)
PATIENT CALLED AND STATES OTHER THINGS ARE WORKING YOU SUGGESTED CONCERNED ABOUT THE RX AND WILL STAY OFF IT UNTIL YOU CALL AND ADVISE SHE SHE COULD CONTINUE - DID NOT NAME THE RX SHE IS TALKING ABOUT.

## 2020-04-27 NOTE — Telephone Encounter (Signed)
L/M on voicemail again- for 3rd time- explained if pt has something else she wants to discuss, she can mychart me, to tell me directly the issues.  Since cannot connect on phone.  Will wait til f/u unless she can give me times to call when she's available.

## 2020-05-10 ENCOUNTER — Other Ambulatory Visit: Payer: Self-pay

## 2020-05-10 ENCOUNTER — Encounter: Payer: Self-pay | Admitting: Physical Medicine and Rehabilitation

## 2020-05-10 ENCOUNTER — Encounter
Payer: Medicare Other | Attending: Physical Medicine and Rehabilitation | Admitting: Physical Medicine and Rehabilitation

## 2020-05-10 VITALS — BP 137/81 | HR 74 | Temp 97.8°F | Ht 65.0 in | Wt 184.2 lb

## 2020-05-10 DIAGNOSIS — M5116 Intervertebral disc disorders with radiculopathy, lumbar region: Secondary | ICD-10-CM | POA: Diagnosis not present

## 2020-05-10 DIAGNOSIS — M48062 Spinal stenosis, lumbar region with neurogenic claudication: Secondary | ICD-10-CM | POA: Insufficient documentation

## 2020-05-10 DIAGNOSIS — M792 Neuralgia and neuritis, unspecified: Secondary | ICD-10-CM | POA: Insufficient documentation

## 2020-05-10 MED ORDER — LEVETIRACETAM 250 MG PO TABS: 250.0000 mg | ORAL_TABLET | Freq: Two times a day (BID) | ORAL | 5 refills | Status: DC

## 2020-05-10 NOTE — Progress Notes (Signed)
Subjective:    Patient ID: Laura Carter, female    DOB: June 20, 1940, 79 y.o.   MRN: [id number]  HPI  Patient is an 79 yr old female with hx of asthma, HTN, GERD, here for chronic B/L thigh/buttock pain. Here for evaluation.  Severe spinal stenosis since 2018- with associated nerve pain.    Had urinary retention with Cymbalta-  PCP said had albumin in urine.   Started taking cranberry tablets- doing 2 tabs BID.   Didn't try tennis ball- has heat massager- on low back and buttocks-   Gets massage and yoga and chiropractor weekly.   Pain still running down legs B/L     Pain Inventory Average Pain 5 Pain Right Now 3 My pain is constant, dull and aching  In the last 24 hours, has pain interfered with the following? General activity 4 Relation with others 9 Enjoyment of life 10 What TIME of day is your pain at its worst? daytime Sleep (in general) Good  Pain is worse with: walking and standing Pain improves with: rest and therapy/exercise Relief from Meds: 5     Family History  Problem Relation Age of Onset  . Cancer Mother        breast  . Stomach cancer Mother   . Breast cancer Mother   . Heart disease Father   . Heart disease Sister   . Breast cancer Sister    Social History   Socioeconomic History  . Marital status: Married    Spouse name: Not on file  . Number of children: 2  . Years of education: Not on file  . Highest education level: 10th grade  Occupational History  . Not on file  Tobacco Use  . Smoking status: Former Smoker    Packs/day: 1.00    Years: 10.00    Pack years: 10.00    Types: Cigarettes    Quit date: 06/13/1965    Years since quitting: 54.9  . Smokeless tobacco: Never Used  Vaping Use  . Vaping Use: Never used  Substance and Sexual Activity  . Alcohol use: No  . Drug use: No  . Sexual activity: Never    Birth control/protection: None  Other Topics Concern  . Not on file  Social History Narrative   Grew up in  [place], [place]. Married. Has one daughter and one son. Has two grandchildren.    Did not work outside the home.   Eats all food groups.    Wear seatbelt.    Went to [employer] and graduated from [employer] in [place].    Social Determinants of Health   Financial Resource Strain: Not on file  Food Insecurity: Not on file  Transportation Needs: Not on file  Physical Activity: Not on file  Stress: Not on file  Social Connections: Not on file   Past Surgical History:  Procedure Laterality Date  . BREAST BIOPSY Left    years ago- no scar ? needle biopsy only  . CHOLECYSTECTOMY  10/10  . COLONOSCOPY    . ESOPHAGOGASTRODUODENOSCOPY    . EYE SURGERY     cataract right  . KNEE ARTHROSCOPY WITH MEDIAL MENISECTOMY Left 06/16/2015   Procedure: KNEE ARTHROSCOPY WITH PARTIAL MEDIAL MENISECTOMY;  Surgeon: Carole Civil, MD;  Location: [facility];  Service: Orthopedics;  Laterality: Left;  . PARTIAL KNEE ARTHROPLASTY Left 07/08/2017   Procedure: UNICOMPARTMENTAL KNEE;  Surgeon: Renette Butters, MD;  Location: [place];  Service: Orthopedics;  Laterality: Left;  Past Medical History:  Diagnosis Date  . Asthma   . Diastolic dysfunction   . GERD (gastroesophageal reflux disease)   . History of hiatal hernia   . Hypertension   . Osteoarthritis of left knee    Temp 97.8 F (36.6 C)   Ht 5\' 5"  (1.651 m)   Wt 184 lb 3.2 oz (83.6 kg)   BMI 30.65 kg/m   Opioid Risk Score:   Fall Risk Score:  `1  Depression screen PHQ 2/9  Depression screen [facility] 2/9 04/03/2020 12/04/2017 11/18/2017 06/10/2017 05/31/2015 05/23/2014  Decreased Interest 0 0 0 0 0 0  Down, Depressed, Hopeless 0 0 0 0 0 0  PHQ - 2 Score 0 0 0 0 0 0  Altered sleeping 0 - - - - -  Tired, decreased energy 0 - - - - -  Change in appetite 0 - - - - -  Feeling bad or failure about yourself  0 - - - - -  Trouble concentrating 0 - - - - -  Moving slowly or fidgety/restless 0 - - - - -  Suicidal thoughts 0 - - - - -  PHQ-9  Score 0 - - - - -  Difficult doing work/chores Not difficult at all - - - - -   Review of Systems  Musculoskeletal: Positive for gait problem.       Hips, legs  All other systems reviewed and are negative.      Objective:   Physical Exam  Awake, alert, appropriate, sitting on table, NAD TTP low back in band- Also R piriformis syndrome       Assessment & Plan:   Patient is an 79 yr old female with hx of asthma, HTN, GERD, here for chronic B/L thigh/buttock pain. Here for evaluation.  Severe spinal stenosis since 2018- with associated nerve pain.    1. Nerve pain- discussed Lyrica vs Keppra- and discussed risks/benefits- decided to do Keppra/Levicetracem.   2. Keppra/Levicetracem-  250 mg 2x/day- x 1 week, then 500 mg 2x/day- Will wait to titrate more til 2nd month of medicine- call me if want to increase dose.  For nerve/back pain.    3.  Continue supplements and Magnesium 400 mg 2x/day, tumeric 500 mg 2x/day, and cinnamon as directed.   4. Con't tylenol as needed.   5. Will not try Controled substance at this time- discussed with pt.   6. Can try CBD tongue oils/topical medicines.  See if it helps. THC level < 3%.     7. F/U in 8 weeks- earlier if need be.    I spent a total of 25 minutes on visit- as detailed above.

## 2020-05-10 NOTE — Patient Instructions (Signed)
Patient is an 78 yr old female with hx of asthma, HTN, GERD, here for chronic B/L thigh/buttock pain. Here for evaluation.  Severe spinal stenosis since 2018- with associated nerve pain.    1. Nerve pain- discussed Lyrica vs Keppra- and discussed risks/benefits- decided to do Keppra/Levicetracem.   2. Keppra/Levicetracem-  250 mg 2x/day- x 1 week, then 500 mg 2x/day- Will wait to titrate more til 2nd month of medicine- call me if want to increase dose.  For nerve/back pain.    3.  Continue supplements and Magnesium 400 mg 2x/day, tumeric 500 mg 2x/day, and cinnamon as directed.   4. Con't tylenol as needed.   5. Will not try Controled substance at this time- discussed with pt.   6. Can try CBD tongue oils/topical medicines.  See if it helps. THC level < 3%.     7. F/U in 8 weeks- earlier if need be.

## 2020-05-24 DIAGNOSIS — M79606 Pain in leg, unspecified: Secondary | ICD-10-CM | POA: Diagnosis not present

## 2020-05-24 DIAGNOSIS — J302 Other seasonal allergic rhinitis: Secondary | ICD-10-CM | POA: Diagnosis not present

## 2020-05-24 DIAGNOSIS — J4521 Mild intermittent asthma with (acute) exacerbation: Secondary | ICD-10-CM | POA: Diagnosis not present

## 2020-05-24 DIAGNOSIS — H35373 Puckering of macula, bilateral: Secondary | ICD-10-CM | POA: Diagnosis not present

## 2020-05-24 DIAGNOSIS — H5213 Myopia, bilateral: Secondary | ICD-10-CM | POA: Diagnosis not present

## 2020-05-24 DIAGNOSIS — G8929 Other chronic pain: Secondary | ICD-10-CM | POA: Diagnosis not present

## 2020-05-24 DIAGNOSIS — F419 Anxiety disorder, unspecified: Secondary | ICD-10-CM | POA: Diagnosis not present

## 2020-05-24 DIAGNOSIS — I10 Essential (primary) hypertension: Secondary | ICD-10-CM | POA: Diagnosis not present

## 2020-06-07 ENCOUNTER — Telehealth: Payer: Self-pay

## 2020-06-07 NOTE — Telephone Encounter (Signed)
Patient called stating no leg pain in 3 weeks. Will see at appt. Just FYI

## 2020-07-12 ENCOUNTER — Other Ambulatory Visit: Payer: Self-pay

## 2020-07-12 ENCOUNTER — Encounter
Payer: Medicare Other | Attending: Physical Medicine and Rehabilitation | Admitting: Physical Medicine and Rehabilitation

## 2020-07-12 ENCOUNTER — Encounter: Payer: Self-pay | Admitting: Physical Medicine and Rehabilitation

## 2020-07-12 VITALS — BP 138/82 | HR 82 | Temp 98.7°F | Ht 65.0 in | Wt 185.8 lb

## 2020-07-12 DIAGNOSIS — M5116 Intervertebral disc disorders with radiculopathy, lumbar region: Secondary | ICD-10-CM | POA: Diagnosis not present

## 2020-07-12 DIAGNOSIS — M48062 Spinal stenosis, lumbar region with neurogenic claudication: Secondary | ICD-10-CM | POA: Diagnosis not present

## 2020-07-12 DIAGNOSIS — M2170 Unequal limb length (acquired), unspecified site: Secondary | ICD-10-CM | POA: Insufficient documentation

## 2020-07-12 NOTE — Patient Instructions (Signed)
Patient is an 80 yr old female with hx of asthma, HTN, GERD, here for chronic B/L thigh/buttock pain. .  Severe spinal stenosis since 2018- with associated nerve pain.  Here for f/u.    1. Keep off Keppra. Because you were itching- don't want to use Keppra again.   2. If needs to start anything, will call me, and will do Lyrica for nerve pain- is sensitive, so will start dose low.   3.  Send to [facility]- to re-measure and if R leg is still shorter than L leg, then to get a shoe insert for leg length discrepancy. Of note, might not fit in "Cute shoes"  4. Wait on PT for now- and see how the shoe insert goes- however if not enough, call me, and I will send in referral for outpt PT.   5. Want you to get foot pedaler/like the bottom of a bike and use daily for at least 10-15 minutes.  My hope is 5 days/week. Don't do on couch!  6. F/U in 8 weeks-

## 2020-07-12 NOTE — Progress Notes (Signed)
Subjective:    Patient ID: Laura Carter, female    DOB: Sep 24, 1940, 79 y.o.   MRN: [id number]  HPI   Patient is an 80 yr old female with hx of asthma, HTN, GERD, here for chronic B/L thigh/buttock pain. .  Severe spinal stenosis since 2018- with associated nerve pain.  Here for f/u.   Things are "Fine", but 90% better-  Depending on how stressed her body.   However, the side effects-  Keppra better than Cymbalta- Sx's wise, but made her feel off center- bumped into things. Couldn't tell where was in space. Felt wobbly.   Very sleepy- quick to fall asleep 2 hr naps- slept well. Itchy as well- couldn't figure out if was due to [place]. Couldn't see a rash-  Was scratching, even on face.   Itch is better Handled medicine- started switching dose around.   Took 2 in Am and 1 at night- then 1 in Am and 2 at night.  Went to 1 pill nightly-  That didn't work either, so went to no Keppra- taken none this week.   Not coming back with discomfort-  Right now, a little muscle tightness- on lateral thighs- but not the pain she was in.   Is better, but off meds.  Getting back to activities that is "her".      Drinks Smooth Move tea at night.    Walks "very little".  Goes to grocery store, -relies on cart.  Went to [employer] a show last night-  Tires her out  Does yoga 1x/week and gets massage 1x/week and chiropractor and accunpuncture every other week. Added Reiki every Friday.   Still limps. R side is "higher" than Left- chiropractor thinks it's different.    Pain Inventory Average Pain 4 Pain Right Now 2 My pain is dull  In the last 24 hours, has pain interfered with the following? General activity 5 Relation with others 0 Enjoyment of life 0 What TIME of day is your pain at its worst? daytime Sleep (in general) Fair  Pain is worse with: standing and some activites Pain improves with: rest and therapy/exercise Relief from Meds: 5  Family History   Problem Relation Age of Onset  . Cancer Mother        breast  . Stomach cancer Mother   . Breast cancer Mother   . Heart disease Father   . Heart disease Sister   . Breast cancer Sister    Social History   Socioeconomic History  . Marital status: Married    Spouse name: Not on file  . Number of children: 2  . Years of education: Not on file  . Highest education level: 10th grade  Occupational History  . Not on file  Tobacco Use  . Smoking status: Former Smoker    Packs/day: 1.00    Years: 10.00    Pack years: 10.00    Types: Cigarettes    Quit date: 06/13/1965    Years since quitting: 55.1  . Smokeless tobacco: Never Used  Vaping Use  . Vaping Use: Never used  Substance and Sexual Activity  . Alcohol use: No  . Drug use: No  . Sexual activity: Never    Birth control/protection: None  Other Topics Concern  . Not on file  Social History Narrative   Grew up in [place], [place]. Married. Has one daughter and one son. Has two grandchildren.    Did not work outside the home.   Eats all  food groups.    Wear seatbelt.    Went to [employer] and graduated from [employer] in [place].    Social Determinants of Health   Financial Resource Strain: Not on file  Food Insecurity: Not on file  Transportation Needs: Not on file  Physical Activity: Not on file  Stress: Not on file  Social Connections: Not on file   Past Surgical History:  Procedure Laterality Date  . BREAST BIOPSY Left    years ago- no scar ? needle biopsy only  . CHOLECYSTECTOMY  10/10  . COLONOSCOPY    . ESOPHAGOGASTRODUODENOSCOPY    . EYE SURGERY     cataract right  . KNEE ARTHROSCOPY WITH MEDIAL MENISECTOMY Left 06/16/2015   Procedure: KNEE ARTHROSCOPY WITH PARTIAL MEDIAL MENISECTOMY;  Surgeon: Carole Civil, MD;  Location: [facility];  Service: Orthopedics;  Laterality: Left;  . PARTIAL KNEE ARTHROPLASTY Left 07/08/2017   Procedure: UNICOMPARTMENTAL KNEE;  Surgeon: Renette Butters, MD;   Location: [facility];  Service: Orthopedics;  Laterality: Left;   Past Surgical History:  Procedure Laterality Date  . BREAST BIOPSY Left    years ago- no scar ? needle biopsy only  . CHOLECYSTECTOMY  10/10  . COLONOSCOPY    . ESOPHAGOGASTRODUODENOSCOPY    . EYE SURGERY     cataract right  . KNEE ARTHROSCOPY WITH MEDIAL MENISECTOMY Left 06/16/2015   Procedure: KNEE ARTHROSCOPY WITH PARTIAL MEDIAL MENISECTOMY;  Surgeon: Carole Civil, MD;  Location: [facility];  Service: Orthopedics;  Laterality: Left;  . PARTIAL KNEE ARTHROPLASTY Left 07/08/2017   Procedure: UNICOMPARTMENTAL KNEE;  Surgeon: Renette Butters, MD;  Location: [place];  Service: Orthopedics;  Laterality: Left;   Past Medical History:  Diagnosis Date  . Asthma   . Diastolic dysfunction   . GERD (gastroesophageal reflux disease)   . History of hiatal hernia   . Hypertension   . Osteoarthritis of left knee    BP 138/82   Pulse 82   Temp 98.7 F (37.1 C)   Ht 5\' 5"  (1.651 m)   Wt 185 lb 12.8 oz (84.3 kg)   SpO2 95%   BMI 30.92 kg/m   Opioid Risk Score:   Fall Risk Score:  `1  Depression screen PHQ 2/9  Depression screen [facility] 2/9 07/12/2020 05/10/2020 04/03/2020 12/04/2017 11/18/2017 06/10/2017 05/31/2015  Decreased Interest 0 0 0 0 0 0 0  Down, Depressed, Hopeless 0 - 0 0 0 0 0  PHQ - 2 Score 0 0 0 0 0 0 0  Altered sleeping - - 0 - - - -  Tired, decreased energy - - 0 - - - -  Change in appetite - - 0 - - - -  Feeling bad or failure about yourself  - - 0 - - - -  Trouble concentrating - - 0 - - - -  Moving slowly or fidgety/restless - - 0 - - - -  Suicidal thoughts - - 0 - - - -  PHQ-9 Score - - 0 - - - -  Difficult doing work/chores - - Not difficult at all - - - -    Review of Systems  Constitutional: Negative.   HENT: Negative.   Eyes: Negative.   Respiratory: Negative.   Cardiovascular: Negative.   Gastrointestinal: Negative.   Endocrine: Negative.   Genitourinary: Negative.   Musculoskeletal: Positive  for back pain.  Skin: Negative.   Allergic/Immunologic: Negative.   Neurological: Negative.   Hematological: Negative.   Psychiatric/Behavioral:  Negative.   All other systems reviewed and are negative.      Objective:   Physical Exam Awake, alert, appropriate, sitting on table, NAD R leg from ASIS to R medial malleolus is 32.5 inches L leg from ASIS to L medial malleolus is 33 inches Sit-to -stand is a little slowed and used hands to push off.        Assessment & Plan:   Patient is an 80 yr old female with hx of asthma, HTN, GERD, here for chronic B/L thigh/buttock pain. .  Severe spinal stenosis since 2018- with associated nerve pain.  Here for f/u.    1. Keep off Keppra. Because you were itching- don't want to use Keppra again.   2. If needs to start anything, will call me, and will do Lyrica for nerve pain- is sensitive, so will start dose low.   3.  Send to [facility]- to re-measure and if R leg is still shorter than L leg, then to get a shoe insert for leg length discrepancy. Of note, might not fit in "Cute shoes"  4. Wait on PT for now- and see how the shoe insert goes- however if not enough, call me, and I will send in referral for outpt PT.   5. Want you to get foot pedaler/like the bottom of a bike and use daily for at least 10-15 minutes.  My hope is 5 days/week. Don't do on couch!  6. F/U in 8 weeks-    I spent a total of 25 minutes on visit- as detailed above.

## 2020-08-16 ENCOUNTER — Telehealth: Payer: Self-pay | Admitting: *Deleted

## 2020-08-16 NOTE — Telephone Encounter (Signed)
Ms Laura Carter called to report that she has been having pain in her thihgs for the last 4 days and does not seem to be lessening. She says Dr Laura Carter asked her to call if things changed. Her pharmacy is [facility] in [place].

## 2020-08-18 MED ORDER — PREGABALIN 25 MG PO CAPS: 25.0000 mg | ORAL_CAPSULE | Freq: Two times a day (BID) | ORAL | 1 refills | Status: DC

## 2020-09-01 DIAGNOSIS — R7303 Prediabetes: Secondary | ICD-10-CM | POA: Diagnosis not present

## 2020-09-01 DIAGNOSIS — F419 Anxiety disorder, unspecified: Secondary | ICD-10-CM | POA: Diagnosis not present

## 2020-09-01 DIAGNOSIS — F320 Major depressive disorder, single episode, mild: Secondary | ICD-10-CM | POA: Diagnosis not present

## 2020-09-01 DIAGNOSIS — E782 Mixed hyperlipidemia: Secondary | ICD-10-CM | POA: Diagnosis not present

## 2020-09-01 DIAGNOSIS — M7918 Myalgia, other site: Secondary | ICD-10-CM | POA: Diagnosis not present

## 2020-09-01 DIAGNOSIS — I10 Essential (primary) hypertension: Secondary | ICD-10-CM | POA: Diagnosis not present

## 2020-09-06 ENCOUNTER — Other Ambulatory Visit: Payer: Self-pay

## 2020-09-06 ENCOUNTER — Encounter
Payer: Medicare Other | Attending: Physical Medicine and Rehabilitation | Admitting: Physical Medicine and Rehabilitation

## 2020-09-06 ENCOUNTER — Encounter: Payer: Self-pay | Admitting: Physical Medicine and Rehabilitation

## 2020-09-06 VITALS — BP 120/80 | HR 67 | Temp 98.5°F | Ht 65.0 in | Wt 186.6 lb

## 2020-09-06 DIAGNOSIS — M792 Neuralgia and neuritis, unspecified: Secondary | ICD-10-CM | POA: Diagnosis not present

## 2020-09-06 DIAGNOSIS — M2170 Unequal limb length (acquired), unspecified site: Secondary | ICD-10-CM | POA: Diagnosis not present

## 2020-09-06 DIAGNOSIS — M48062 Spinal stenosis, lumbar region with neurogenic claudication: Secondary | ICD-10-CM

## 2020-09-06 MED ORDER — PREGABALIN 25 MG PO CAPS: 25.0000 mg | ORAL_CAPSULE | Freq: Two times a day (BID) | ORAL | 1 refills | Status: DC

## 2020-09-06 NOTE — Patient Instructions (Signed)
  Patient is an 80 yr old female with hx of asthma, HTN, GERD, here for chronic B/L thigh/buttock pain. .  Severe spinal stenosis since 2018- with associated nerve pain. Here for f/u on chronic back pain.   1. Suggest tennis ball - hold pressure downwards on table/hard surface- for 5 minutes- at least daily for 30 days, then change to as needed  2. Can try taking Lyrica/Pregablin- 50 mg nightly- not 25 mg 2x/day- if it helps, grea-t if doesn't, then go back to 25 mg nightly. Will refill so can take 50 mg nightly-   3. Work to expose you to more sunlight- can be sunny room.   4. Wait on SSRIs/for mood- work on environmental causes.   5. F/U in 2 months

## 2020-09-06 NOTE — Progress Notes (Signed)
Subjective:    Patient ID: Laura Carter, female    DOB: Apr 13, 1941, 80 y.o.   MRN: [id number]  HPI    Patient is an 80 yr old female with hx of asthma, HTN, GERD, here for chronic B/L thigh/buttock pain. .  Severe spinal stenosis since 2018- with associated nerve pain. Here for f/u on chronic back pain.   Did go to [facility]- 1/4 inch down- R leg a little shorter. Can go get insert- put in 1 shoe- from drugstore.   Has a limp-   Saw Pharmacist, hospital at [facility]. About leg length discrepancy.   Definitely better but by the end of the day- gets more uncomfortable.  Has discomfort on back of thighs B/L - in hamstrings- not tight, burning.   2+ years ago, felt like a "ligament blew" or pop-  in back of L thigh- hamstrings.  Would grab/tight- will shift to R side and put weight on RLE to get off LLE.   Her massage therapist- has felt the tightening of muscle.   Still continues with acupuncture, massage, reiki and yoga are weekly and chriopractor and q every other week.  Thinks it's better from either attitude or weather related.   Is taking Lyrica 25 mg BID- actually only taking QHS. Because it makes her goofy/sleepy.  Unsteady feeling. No symptoms during day when takes just at night.   Wears down around 3pm- sometimes nap around 4pm for 1 hour.   Mood has been worse last few+ months- Thinks OK where she is.   .   Pain Inventory Average Pain 2 Pain Right Now 1 My pain is burning and aching  In the last 24 hours, has pain interfered with the following? General activity 1 Relation with others 1 Enjoyment of life 1 What TIME of day is your pain at its worst? evening Sleep (in general) Good  Pain is worse with: walking and standing Pain improves with: medication Relief from Meds: 9  Family History  Problem Relation Age of Onset  . Cancer Mother        breast  . Stomach cancer Mother   . Breast cancer Mother   . Heart disease Father   . Heart disease Sister    . Breast cancer Sister    Social History   Socioeconomic History  . Marital status: Married    Spouse name: Not on file  . Number of children: 2  . Years of education: Not on file  . Highest education level: 10th grade  Occupational History  . Not on file  Tobacco Use  . Smoking status: Former Smoker    Packs/day: 1.00    Years: 10.00    Pack years: 10.00    Types: Cigarettes    Quit date: 06/13/1965    Years since quitting: 55.2  . Smokeless tobacco: Never Used  Vaping Use  . Vaping Use: Never used  Substance and Sexual Activity  . Alcohol use: No  . Drug use: No  . Sexual activity: Never    Birth control/protection: None  Other Topics Concern  . Not on file  Social History Narrative   Grew up in [place], [place]. Married. Has one daughter and one son. Has two grandchildren.    Did not work outside the home.   Eats all food groups.    Wear seatbelt.    Went to [employer] and graduated from [employer] in [place].    Social Determinants of Health   Financial Resource Strain:  Not on file  Food Insecurity: Not on file  Transportation Needs: Not on file  Physical Activity: Not on file  Stress: Not on file  Social Connections: Not on file   Past Surgical History:  Procedure Laterality Date  . BREAST BIOPSY Left    years ago- no scar ? needle biopsy only  . CHOLECYSTECTOMY  10/10  . COLONOSCOPY    . ESOPHAGOGASTRODUODENOSCOPY    . EYE SURGERY     cataract right  . KNEE ARTHROSCOPY WITH MEDIAL MENISECTOMY Left 06/16/2015   Procedure: KNEE ARTHROSCOPY WITH PARTIAL MEDIAL MENISECTOMY;  Surgeon: Carole Civil, MD;  Location: [facility];  Service: Orthopedics;  Laterality: Left;  . PARTIAL KNEE ARTHROPLASTY Left 07/08/2017   Procedure: UNICOMPARTMENTAL KNEE;  Surgeon: Renette Butters, MD;  Location: [place];  Service: Orthopedics;  Laterality: Left;   Past Surgical History:  Procedure Laterality Date  . BREAST BIOPSY Left    years ago- no scar ? needle  biopsy only  . CHOLECYSTECTOMY  10/10  . COLONOSCOPY    . ESOPHAGOGASTRODUODENOSCOPY    . EYE SURGERY     cataract right  . KNEE ARTHROSCOPY WITH MEDIAL MENISECTOMY Left 06/16/2015   Procedure: KNEE ARTHROSCOPY WITH PARTIAL MEDIAL MENISECTOMY;  Surgeon: Carole Civil, MD;  Location: [facility];  Service: Orthopedics;  Laterality: Left;  . PARTIAL KNEE ARTHROPLASTY Left 07/08/2017   Procedure: UNICOMPARTMENTAL KNEE;  Surgeon: Renette Butters, MD;  Location: [place];  Service: Orthopedics;  Laterality: Left;   Past Medical History:  Diagnosis Date  . Asthma   . Diastolic dysfunction   . GERD (gastroesophageal reflux disease)   . History of hiatal hernia   . Hypertension   . Osteoarthritis of left knee    BP 120/80   Pulse 67   Temp 98.5 F (36.9 C)   Ht 5\' 5"  (1.651 m)   Wt 186 lb 9.6 oz (84.6 kg)   SpO2 96%   BMI 31.05 kg/m   Opioid Risk Score:   Fall Risk Score:  `1  Depression screen PHQ 2/9  Depression screen [facility] 2/9 07/12/2020 05/10/2020 04/03/2020 12/04/2017 11/18/2017 06/10/2017 05/31/2015  Decreased Interest 0 0 0 0 0 0 0  Down, Depressed, Hopeless 0 - 0 0 0 0 0  PHQ - 2 Score 0 0 0 0 0 0 0  Altered sleeping - - 0 - - - -  Tired, decreased energy - - 0 - - - -  Change in appetite - - 0 - - - -  Feeling bad or failure about yourself  - - 0 - - - -  Trouble concentrating - - 0 - - - -  Moving slowly or fidgety/restless - - 0 - - - -  Suicidal thoughts - - 0 - - - -  PHQ-9 Score - - 0 - - - -  Difficult doing work/chores - - Not difficult at all - - - -    Review of Systems  Musculoskeletal:       Pain in back of thigh   All other systems reviewed and are negative.      Objective:   Physical Exam Awake, alert, appropriate, appears more comfortable, NAD TTP across L hamstring- tight trigger point palpated.  Also across in band across low back TTP       Assessment & Plan:   Patient is an 80 yr old female with hx of asthma, HTN, GERD, here for chronic B/L  thigh/buttock pain. .  Severe  spinal stenosis since 2018- with associated nerve pain. Here for f/u on chronic back pain.   1. Suggest tennis ball - hold pressure downwards on table/hard surface- for 5 minutes- at least daily for 30 days, then change to as needed  2. Can try taking Lyrica/Pregablin- 50 mg nightly- not 25 mg 2x/day- if it helps, grea-t if doesn't, then go back to 25 mg nightly. Will refill so can take 50 mg nightly-   3. Work to expose you to more sunlight- can be sunny room.   4. Wait on SSRIs/for mood- work on environmental causes.   5. F/U in 2 months   I spent a total of 25 minutes on visit- discussing mood and pain as above.

## 2020-10-04 DIAGNOSIS — I10 Essential (primary) hypertension: Secondary | ICD-10-CM | POA: Diagnosis not present

## 2020-10-04 DIAGNOSIS — M7918 Myalgia, other site: Secondary | ICD-10-CM | POA: Diagnosis not present

## 2020-10-04 DIAGNOSIS — R7303 Prediabetes: Secondary | ICD-10-CM | POA: Diagnosis not present

## 2020-10-04 DIAGNOSIS — F419 Anxiety disorder, unspecified: Secondary | ICD-10-CM | POA: Diagnosis not present

## 2020-10-04 DIAGNOSIS — J302 Other seasonal allergic rhinitis: Secondary | ICD-10-CM | POA: Diagnosis not present

## 2020-10-04 DIAGNOSIS — E782 Mixed hyperlipidemia: Secondary | ICD-10-CM | POA: Diagnosis not present

## 2020-10-04 DIAGNOSIS — F320 Major depressive disorder, single episode, mild: Secondary | ICD-10-CM | POA: Diagnosis not present

## 2020-10-22 DIAGNOSIS — R21 Rash and other nonspecific skin eruption: Secondary | ICD-10-CM | POA: Diagnosis not present

## 2020-10-22 DIAGNOSIS — L282 Other prurigo: Secondary | ICD-10-CM | POA: Diagnosis not present

## 2020-10-22 DIAGNOSIS — W57XXXA Bitten or stung by nonvenomous insect and other nonvenomous arthropods, initial encounter: Secondary | ICD-10-CM | POA: Diagnosis not present

## 2020-10-24 DIAGNOSIS — L249 Irritant contact dermatitis, unspecified cause: Secondary | ICD-10-CM | POA: Diagnosis not present

## 2020-10-24 DIAGNOSIS — W57XXXA Bitten or stung by nonvenomous insect and other nonvenomous arthropods, initial encounter: Secondary | ICD-10-CM | POA: Diagnosis not present

## 2020-11-08 ENCOUNTER — Other Ambulatory Visit: Payer: Self-pay

## 2020-11-08 ENCOUNTER — Encounter
Payer: Medicare Other | Attending: Physical Medicine and Rehabilitation | Admitting: Physical Medicine and Rehabilitation

## 2020-11-08 ENCOUNTER — Encounter: Payer: Self-pay | Admitting: Physical Medicine and Rehabilitation

## 2020-11-08 VITALS — BP 142/79 | HR 64 | Temp 98.0°F | Ht 65.0 in | Wt 189.4 lb

## 2020-11-08 DIAGNOSIS — M48062 Spinal stenosis, lumbar region with neurogenic claudication: Secondary | ICD-10-CM | POA: Insufficient documentation

## 2020-11-08 DIAGNOSIS — M5116 Intervertebral disc disorders with radiculopathy, lumbar region: Secondary | ICD-10-CM | POA: Diagnosis not present

## 2020-11-08 DIAGNOSIS — M792 Neuralgia and neuritis, unspecified: Secondary | ICD-10-CM | POA: Diagnosis not present

## 2020-11-08 NOTE — Progress Notes (Signed)
Subjective:    Patient ID: Laura Carter, female    DOB: Nov 25, 1940, 80 y.o.   MRN: [id number]  HPI Patient is an 80 yr old female with hx of asthma, HTN, GERD, here for chronic B/L thigh/buttock pain. . Severe spinal stenosis since 2018- with associated nerve pain.   Here for f/u on chronic back pain.   Mood wise-  Pain has considerably got better- actually had 1 day- no pain for 1 entire day.    Had tick bite- June 2022- was put on Doxycycline- just got off yesterday.   Felt yucky last few days. Since on Doxycycline.   Sleeps 6+hours/night.   Felt goofy as well since started Doxycycline- Not depressed- but kind of "down/yuck, Blah".  Just finished Doxy yesterday.   Cutting out sugar- also started that as well. Loved sugar, esp in Am coffee.  Almost totally cut off carbs- last 10-14 days.    Having a lot in her world that could cause depression- staying busy and seeing friends regularly.   Process of downsizing- 55 years of accumulating stuff.  Lives in large house.   Using the tennis ball and rolling thighs- and heated massager.  Problems with pain with groceries and afternoon overall.    Muscle in back of L thigh-  Sets off the R side- muscle grabs. Has had dry needling.    Does accupuncture and chiro- each every other week.     Pain Inventory Average Pain 4 Pain Right Now 2 My pain is stabbing  In the last 24 hours, has pain interfered with the following? General activity 3 Relation with others 2 Enjoyment of life 2 What TIME of day is your pain at its worst? daytime Sleep (in general) Good  Pain is worse with: walking Pain improves with: medication Relief from Meds: 4  Family History  Problem Relation Age of Onset   Cancer Mother        breast   Stomach cancer Mother    Breast cancer Mother    Heart disease Father    Heart disease Sister    Breast cancer Sister    Social History   Socioeconomic History   Marital status: Married     Spouse name: Not on file   Number of children: 2   Years of education: Not on file   Highest education level: 10th grade  Occupational History   Not on file  Tobacco Use   Smoking status: Former    Packs/day: 1.00    Years: 10.00    Pack years: 10.00    Types: Cigarettes    Quit date: 06/13/1965    Years since quitting: 55.4   Smokeless tobacco: Never  Vaping Use   Vaping Use: Never used  Substance and Sexual Activity   Alcohol use: No   Drug use: No   Sexual activity: Never    Birth control/protection: None  Other Topics Concern   Not on file  Social History Narrative   Grew up in [place], [place]. Married. Has one daughter and one son. Has two grandchildren.    Did not work outside the home.   Eats all food groups.    Wear seatbelt.    Went to [employer] and graduated from [employer] in [place].    Social Determinants of Health   Financial Resource Strain: Not on file  Food Insecurity: Not on file  Transportation Needs: Not on file  Physical Activity: Not on file  Stress: Not on file  Social Connections: Not on file   Past Surgical History:  Procedure Laterality Date   BREAST BIOPSY Left    years ago- no scar ? needle biopsy only   CHOLECYSTECTOMY  10/10   COLONOSCOPY     ESOPHAGOGASTRODUODENOSCOPY     EYE SURGERY     cataract right   KNEE ARTHROSCOPY WITH MEDIAL MENISECTOMY Left 06/16/2015   Procedure: KNEE ARTHROSCOPY WITH PARTIAL MEDIAL MENISECTOMY;  Surgeon: Carole Civil, MD;  Location: [facility];  Service: Orthopedics;  Laterality: Left;   PARTIAL KNEE ARTHROPLASTY Left 07/08/2017   Procedure: UNICOMPARTMENTAL KNEE;  Surgeon: Renette Butters, MD;  Location: [place];  Service: Orthopedics;  Laterality: Left;   Past Surgical History:  Procedure Laterality Date   BREAST BIOPSY Left    years ago- no scar ? needle biopsy only   CHOLECYSTECTOMY  10/10   COLONOSCOPY     ESOPHAGOGASTRODUODENOSCOPY     EYE SURGERY     cataract right   KNEE  ARTHROSCOPY WITH MEDIAL MENISECTOMY Left 06/16/2015   Procedure: KNEE ARTHROSCOPY WITH PARTIAL MEDIAL MENISECTOMY;  Surgeon: Carole Civil, MD;  Location: [facility];  Service: Orthopedics;  Laterality: Left;   PARTIAL KNEE ARTHROPLASTY Left 07/08/2017   Procedure: UNICOMPARTMENTAL KNEE;  Surgeon: Renette Butters, MD;  Location: [facility];  Service: Orthopedics;  Laterality: Left;   Past Medical History:  Diagnosis Date   Asthma    Diastolic dysfunction    GERD (gastroesophageal reflux disease)    History of hiatal hernia    Hypertension    Osteoarthritis of left knee    BP (!) 142/79   Pulse 64   Temp 98 F (36.7 C)   Ht 5\' 5"  (1.651 m)   Wt 189 lb 6.4 oz (85.9 kg)   SpO2 97%   BMI 31.52 kg/m   Opioid Risk Score:   Fall Risk Score:  `1  Depression screen PHQ 2/9  Depression screen [facility] 2/9 11/08/2020 07/12/2020 05/10/2020 04/03/2020 12/04/2017 11/18/2017 06/10/2017  Decreased Interest 0 0 0 0 0 0 0  Down, Depressed, Hopeless 0 0 - 0 0 0 0  PHQ - 2 Score 0 0 0 0 0 0 0  Altered sleeping - - - 0 - - -  Tired, decreased energy - - - 0 - - -  Change in appetite - - - 0 - - -  Feeling bad or failure about yourself  - - - 0 - - -  Trouble concentrating - - - 0 - - -  Moving slowly or fidgety/restless - - - 0 - - -  Suicidal thoughts - - - 0 - - -  PHQ-9 Score - - - 0 - - -  Difficult doing work/chores - - - Not difficult at all - - -    Review of Systems  Constitutional: Negative.   HENT: Negative.    Eyes: Negative.   Respiratory: Negative.    Cardiovascular:  Positive for leg swelling.  Gastrointestinal: Negative.   Endocrine: Negative.   Genitourinary: Negative.   Musculoskeletal:  Positive for myalgias.       Back of thighs  Skin: Negative.   Allergic/Immunologic: Negative.   Neurological: Negative.   Hematological: Negative.   Psychiatric/Behavioral: Negative.    All other systems reviewed and are negative.     Objective:   Physical Exam Awake, alert, appropriate,  slightly tangential this AM, NAD Tight over L hamstring- multiple trigger points      Assessment & Plan:  Patient is an 80 yr old female with hx of asthma, HTN, GERD, here for chronic B/L thigh/buttock pain. . Severe spinal stenosis since 2018- with associated nerve pain.   Here for f/u on chronic back pain.  Needs a Girl Friday. Suggest a high schooler/college kid- needs HELP.  2.  Will schedule for trigger point injections of L hamstring.  3. Con't tennis balls and foam rollers Con't Lyrica 50 mg QHS.  4. Wait on SSRIs- it's likely the carb restrictions- will feel better the next week or two.  5. Discussed other options to help pain- will wait for now on trigger point injections.  6. F/U ASAP as well as 3 months

## 2020-11-08 NOTE — Patient Instructions (Signed)
Patient is an 80 yr old female with hx of asthma, HTN, GERD, here for chronic B/L thigh/buttock pain. . Severe spinal stenosis since 2018- with associated nerve pain.   Here for f/u on chronic back pain.  Needs a Girl Friday. Suggest a high schooler/college kid- needs HELP.  2.  Will schedule for trigger point injections of L hamstring.  3. Con't tennis balls and foam rollers Con't Lyrica 50 mg QHS.  4. Wait on SSRIs- it's likely the carb restrictions- will feel better the next week or two.  5. Discussed other options to help pain- will wait for now on trigger point injections.  6. F/U ASAP as well as 3 months

## 2020-11-14 ENCOUNTER — Other Ambulatory Visit: Payer: Self-pay | Admitting: Family Medicine

## 2020-11-14 DIAGNOSIS — Z1231 Encounter for screening mammogram for malignant neoplasm of breast: Secondary | ICD-10-CM

## 2020-12-07 DIAGNOSIS — H02054 Trichiasis without entropian left upper eyelid: Secondary | ICD-10-CM | POA: Diagnosis not present

## 2020-12-08 DIAGNOSIS — Z20822 Contact with and (suspected) exposure to covid-19: Secondary | ICD-10-CM | POA: Diagnosis not present

## 2020-12-11 DIAGNOSIS — R21 Rash and other nonspecific skin eruption: Secondary | ICD-10-CM | POA: Diagnosis not present

## 2020-12-11 DIAGNOSIS — I10 Essential (primary) hypertension: Secondary | ICD-10-CM | POA: Diagnosis not present

## 2021-01-01 ENCOUNTER — Encounter: Payer: Self-pay | Admitting: Physical Medicine and Rehabilitation

## 2021-01-01 ENCOUNTER — Encounter
Payer: Medicare Other | Attending: Physical Medicine and Rehabilitation | Admitting: Physical Medicine and Rehabilitation

## 2021-01-01 ENCOUNTER — Other Ambulatory Visit: Payer: Self-pay

## 2021-01-01 VITALS — BP 132/79 | HR 69 | Temp 98.3°F | Ht 65.0 in | Wt 189.4 lb

## 2021-01-01 DIAGNOSIS — M5116 Intervertebral disc disorders with radiculopathy, lumbar region: Secondary | ICD-10-CM | POA: Insufficient documentation

## 2021-01-01 DIAGNOSIS — M48062 Spinal stenosis, lumbar region with neurogenic claudication: Secondary | ICD-10-CM | POA: Diagnosis not present

## 2021-01-01 DIAGNOSIS — M792 Neuralgia and neuritis, unspecified: Secondary | ICD-10-CM | POA: Insufficient documentation

## 2021-01-01 NOTE — Patient Instructions (Addendum)
Plan: Sounds like pt had Lyme's disease or long COVID from 10/19-   2. Discussed anti-inflammatory diet- to reduce carbs/and cut out sugars- can help arthritis.   3. Discussed Bodytalk paradigm.   4. Discussed Person A, victim B or survivor C  -how to respond to this.   5. Patient here for trigger point injections for  Consent done and on chart.  Cleaned areas with alcohol and injected using a 27 gauge 1.5 inch needle  Injected 1.5cc Using 1% Lidocaine with no EPI  Upper traps Levators Posterior scalenes Middle scalenes Splenius Capitus Pectoralis Major Rhomboids Infraspinatus Teres Major/minor Thoracic paraspinals Lumbar paraspinals Other injections- L posterior thigh- injection x 3 more proximal and more distal.    Patient's level of pain prior was 1-2/10 Current level of pain after injections is 1/10- muscles tightness, but less painful.   There was no bleeding or complications.  Patient was advised to drink a lot of water on day after injections to flush system Will have increased soreness for 12-48 hours after injections.  Can use Lidocaine patches the day AFTER injections Can use theracane on day of injections in places didn't inject Can use heating pad 4-6 hours AFTER injections  6. If this doesn't help, will order lumbar MRI to assess why exactly having more pain.   7. F/U in 2-3 months- see how things are going.   8. Think about what to do with Pregabalin- try 2x/day- but go up slowly.   I spent a total of 45 minutes on visit- 10 minutes on injections.35 minutes on visit.

## 2021-01-01 NOTE — Progress Notes (Signed)
Patient is an 80 yr old female with hx of asthma, HTN, GERD, here for chronic B/L thigh/buttock pain. . Severe spinal stenosis since 2018- with associated nerve pain.   Here for f/u on chronic back pain.  Still has the pull- on L posterior thigh- Gets massage every week. And hurts esp with massage.  On the R posterior thigh   Stopped Lyrica 3 weeks ago- since wasn't having nerve pain- came off slowly.    Getting a lot of cramps- at night/early morning-  In calves and behind her knees. B/L.  Even while on Lyrica.   Nighttime burning and stinging in lower legs- uses Arnica on it.  Wondering if Sx's could be from [place] not sure if had COVID- "wasn't dx'd"-  In October 2019- thinks had COVID- was initially treated for a tick bite- Oct/Nov/Dec 2019-   Was treated for tick bite, and allergies- (allergy meds);  Thinks either had COVID or Lyme's disease.   Has pain that keeps moving around, but mainly in legs.  Also c/o burning, tingling-  Cool/cold sheets helps.     Has fallen asleep almost while driving-    Plan: Sounds like pt had Lyme's disease or long COVID from 10/19-   2. Discussed anti-inflammatory diet- to reduce carbs/and cut out sugars- can help arthritis.   3. Discussed Bodytalk paradigm. Thinks it's not bad to try, but not sure will fix anything.   4. Discussed Person A, victim B or survivor C  -how to respond to this.   5. Patient here for trigger point injections for  Consent done and on chart.  Cleaned areas with alcohol and injected using a 27 gauge 1.5 inch needle  Injected 1.5cc Using 1% Lidocaine with no EPI  Upper traps Levators Posterior scalenes Middle scalenes Splenius Capitus Pectoralis Major Rhomboids Infraspinatus Teres Major/minor Thoracic paraspinals Lumbar paraspinals Other injections- L posterior thigh- injection x 3 more proximal and more distal.    Patient's level of pain prior was 1-2/10 Current level of pain after  injections is 1/10- muscles tightness, but less painful.   There was no bleeding or complications.  Patient was advised to drink a lot of water on day after injections to flush system Will have increased soreness for 12-48 hours after injections.  Can use Lidocaine patches the day AFTER injections Can use theracane on day of injections in places didn't inject Can use heating pad 4-6 hours AFTER injections  6. If this doesn't help, will order lumbar MRI to assess why exactly having more pain.   7. F/U in 2-3 months- see how things are going.   I spent a total of 45 minutes on visit- 10 minutes on injections.35 minutes on visit.

## 2021-01-05 ENCOUNTER — Ambulatory Visit
Admission: RE | Admit: 2021-01-05 | Discharge: 2021-01-05 | Disposition: A | Discharge status: Home / Self Care | Payer: Medicare Other | Source: Ambulatory Visit | Attending: Family Medicine | Admitting: Family Medicine

## 2021-01-05 ENCOUNTER — Other Ambulatory Visit: Payer: Self-pay

## 2021-01-05 DIAGNOSIS — Z1231 Encounter for screening mammogram for malignant neoplasm of breast: Secondary | ICD-10-CM | POA: Diagnosis not present

## 2021-02-05 ENCOUNTER — Encounter
Payer: Medicare Other | Attending: Physical Medicine and Rehabilitation | Admitting: Physical Medicine and Rehabilitation

## 2021-02-05 ENCOUNTER — Other Ambulatory Visit: Payer: Self-pay

## 2021-02-05 ENCOUNTER — Encounter: Payer: Self-pay | Admitting: Physical Medicine and Rehabilitation

## 2021-02-05 VITALS — BP 137/80 | HR 66 | Temp 98.3°F | Ht 65.0 in

## 2021-02-05 DIAGNOSIS — M48062 Spinal stenosis, lumbar region with neurogenic claudication: Secondary | ICD-10-CM | POA: Insufficient documentation

## 2021-02-05 DIAGNOSIS — M5116 Intervertebral disc disorders with radiculopathy, lumbar region: Secondary | ICD-10-CM | POA: Insufficient documentation

## 2021-02-05 DIAGNOSIS — M2170 Unequal limb length (acquired), unspecified site: Secondary | ICD-10-CM | POA: Diagnosis not present

## 2021-02-05 NOTE — Patient Instructions (Signed)
Patient is an 80 yr old female with hx of asthma, HTN, GERD, here for chronic B/L thigh/buttock pain. . Severe spinal stenosis since 2018- with associated nerve pain.   Here for f/u on chronic back pain.  Doing so much better since trP injections- con't yoga, massage, etc.   2. Con't Lyrica if needs it- make sure  I know if you restart  Lyrica.   3. F/U in 3 months-  for f/u and maybe trigger point injections?- call me if needs trigger point injections.

## 2021-02-05 NOTE — Progress Notes (Signed)
Subjective:    Patient ID: Laura Carter, female    DOB: 08-17-1940, 80 y.o.   MRN: [id number]  HPI Patient is an 80 yr old female with hx of asthma, HTN, GERD, here for chronic B/L thigh/buttock pain. . Severe spinal stenosis since 2018- with associated nerve pain.   Here for f/u on chronic back pain.  Here to discuss good news.  Wardrobe fell on her- seasonal- didn't pick it up. It's OK.   4 weeks of no or minimal pain.  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   Starting to come back a little.  Shots /Trigger point injections did help.  Moved up into the buttocks on L and that subsided.   Doesn't feel like needs lumbar MRI-   Didn't start back on lyrica since pain so much better.   Pain Inventory Average Pain 2 Pain Right Now 1 My pain is intermittent, sharp, and shooting  In the last 24 hours, has pain interfered with the following? General activity 1 Relation with others 0 Enjoyment of life 7 What TIME of day is your pain at its worst? evening Sleep (in general) Good  Pain is worse with: walking Pain improves with: heat/ice Relief from Meds: 2  Family History  Problem Relation Age of Onset   Cancer Mother        breast   Stomach cancer Mother    Breast cancer Mother    Heart disease Father    Heart disease Sister    Breast cancer Sister    Social History   Socioeconomic History   Marital status: Married    Spouse name: Not on file   Number of children: 2   Years of education: Not on file   Highest education level: 10th grade  Occupational History   Not on file  Tobacco Use   Smoking status: Former    Packs/day: 1.00    Years: 10.00    Pack years: 10.00    Types: Cigarettes    Quit date: 06/13/1965    Years since quitting: 55.6   Smokeless tobacco: Never  Vaping Use   Vaping Use: Never used  Substance and Sexual Activity   Alcohol use: No   Drug use: No   Sexual activity: Never    Birth control/protection: None  Other Topics  Concern   Not on file  Social History Narrative   Grew up in [place], [place]. Married. Has one daughter and one son. Has two grandchildren.    Did not work outside the home.   Eats all food groups.    Wear seatbelt.    Went to [employer] and graduated from [employer] in [place].    Social Determinants of Health   Financial Resource Strain: Not on file  Food Insecurity: Not on file  Transportation Needs: Not on file  Physical Activity: Not on file  Stress: Not on file  Social Connections: Not on file   Past Surgical History:  Procedure Laterality Date   BREAST BIOPSY Left    years ago- no scar ? needle biopsy only   CHOLECYSTECTOMY  10/10   COLONOSCOPY     ESOPHAGOGASTRODUODENOSCOPY     EYE SURGERY     cataract right   KNEE ARTHROSCOPY WITH MEDIAL MENISECTOMY Left 06/16/2015   Procedure: KNEE ARTHROSCOPY WITH PARTIAL MEDIAL MENISECTOMY;  Surgeon: Carole Civil, MD;  Location: [facility];  Service: Orthopedics;  Laterality: Left;   PARTIAL KNEE ARTHROPLASTY Left 07/08/2017   Procedure: UNICOMPARTMENTAL  KNEE;  Surgeon: Renette Butters, MD;  Location: [facility];  Service: Orthopedics;  Laterality: Left;   Past Surgical History:  Procedure Laterality Date   BREAST BIOPSY Left    years ago- no scar ? needle biopsy only   CHOLECYSTECTOMY  10/10   COLONOSCOPY     ESOPHAGOGASTRODUODENOSCOPY     EYE SURGERY     cataract right   KNEE ARTHROSCOPY WITH MEDIAL MENISECTOMY Left 06/16/2015   Procedure: KNEE ARTHROSCOPY WITH PARTIAL MEDIAL MENISECTOMY;  Surgeon: Carole Civil, MD;  Location: [facility];  Service: Orthopedics;  Laterality: Left;   PARTIAL KNEE ARTHROPLASTY Left 07/08/2017   Procedure: UNICOMPARTMENTAL KNEE;  Surgeon: Renette Butters, MD;  Location: [facility];  Service: Orthopedics;  Laterality: Left;   Past Medical History:  Diagnosis Date   Asthma    Diastolic dysfunction    GERD (gastroesophageal reflux disease)    History of hiatal hernia    Hypertension     Osteoarthritis of left knee    BP 137/80   Pulse 66   Temp 98.3 F (36.8 C)   Ht '5\' 5"'$  (1.651 m)   SpO2 97%   BMI 31.52 kg/m   Opioid Risk Score:   Fall Risk Score:  `1  Depression screen PHQ 2/9  Depression screen [facility] 2/9 11/08/2020 07/12/2020 05/10/2020 04/03/2020 12/04/2017 11/18/2017 06/10/2017  Decreased Interest 0 0 0 0 0 0 0  Down, Depressed, Hopeless 0 0 - 0 0 0 0  PHQ - 2 Score 0 0 0 0 0 0 0  Altered sleeping - - - 0 - - -  Tired, decreased energy - - - 0 - - -  Change in appetite - - - 0 - - -  Feeling bad or failure about yourself  - - - 0 - - -  Trouble concentrating - - - 0 - - -  Moving slowly or fidgety/restless - - - 0 - - -  Suicidal thoughts - - - 0 - - -  PHQ-9 Score - - - 0 - - -  Difficult doing work/chores - - - Not difficult at all - - -    Review of Systems  Musculoskeletal:        Back upper thighs  All other systems reviewed and are negative.     Objective:   Physical Exam  Awake, alert, appropriate, sitting on table- looks more comfortable, NAD       Assessment & Plan:    Patient is an 80 yr old female with hx of asthma, HTN, GERD, here for chronic B/L thigh/buttock pain. . Severe spinal stenosis since 2018- with associated nerve pain.   Here for f/u on chronic back pain.  Doing so much better since trP injections- con't yoga, massage, etc.   2. Con't Lyrica if needs it- make sure  I know if you restart  Lyrica.   3. F/U in 3 months-

## 2021-02-08 DIAGNOSIS — J302 Other seasonal allergic rhinitis: Secondary | ICD-10-CM | POA: Diagnosis not present

## 2021-02-08 DIAGNOSIS — Z23 Encounter for immunization: Secondary | ICD-10-CM | POA: Diagnosis not present

## 2021-02-08 DIAGNOSIS — E782 Mixed hyperlipidemia: Secondary | ICD-10-CM | POA: Diagnosis not present

## 2021-02-08 DIAGNOSIS — I10 Essential (primary) hypertension: Secondary | ICD-10-CM | POA: Diagnosis not present

## 2021-02-08 DIAGNOSIS — R7303 Prediabetes: Secondary | ICD-10-CM | POA: Diagnosis not present

## 2021-02-08 DIAGNOSIS — R202 Paresthesia of skin: Secondary | ICD-10-CM | POA: Diagnosis not present

## 2021-02-08 DIAGNOSIS — D649 Anemia, unspecified: Secondary | ICD-10-CM | POA: Diagnosis not present

## 2021-02-08 DIAGNOSIS — F419 Anxiety disorder, unspecified: Secondary | ICD-10-CM | POA: Diagnosis not present

## 2021-02-08 DIAGNOSIS — K219 Gastro-esophageal reflux disease without esophagitis: Secondary | ICD-10-CM | POA: Diagnosis not present

## 2021-02-08 DIAGNOSIS — Z0000 Encounter for general adult medical examination without abnormal findings: Secondary | ICD-10-CM | POA: Diagnosis not present

## 2021-02-08 DIAGNOSIS — J4521 Mild intermittent asthma with (acute) exacerbation: Secondary | ICD-10-CM | POA: Diagnosis not present

## 2021-02-28 DIAGNOSIS — D2239 Melanocytic nevi of other parts of face: Secondary | ICD-10-CM | POA: Diagnosis not present

## 2021-02-28 DIAGNOSIS — L570 Actinic keratosis: Secondary | ICD-10-CM | POA: Diagnosis not present

## 2021-02-28 DIAGNOSIS — L720 Epidermal cyst: Secondary | ICD-10-CM | POA: Diagnosis not present

## 2021-02-28 DIAGNOSIS — Z23 Encounter for immunization: Secondary | ICD-10-CM | POA: Diagnosis not present

## 2021-02-28 DIAGNOSIS — L820 Inflamed seborrheic keratosis: Secondary | ICD-10-CM | POA: Diagnosis not present

## 2021-02-28 DIAGNOSIS — L821 Other seborrheic keratosis: Secondary | ICD-10-CM | POA: Diagnosis not present

## 2021-02-28 DIAGNOSIS — D225 Melanocytic nevi of trunk: Secondary | ICD-10-CM | POA: Diagnosis not present

## 2021-02-28 DIAGNOSIS — L578 Other skin changes due to chronic exposure to nonionizing radiation: Secondary | ICD-10-CM | POA: Diagnosis not present

## 2021-02-28 DIAGNOSIS — L814 Other melanin hyperpigmentation: Secondary | ICD-10-CM | POA: Diagnosis not present

## 2021-03-07 DIAGNOSIS — I10 Essential (primary) hypertension: Secondary | ICD-10-CM | POA: Diagnosis not present

## 2021-03-07 DIAGNOSIS — F419 Anxiety disorder, unspecified: Secondary | ICD-10-CM | POA: Diagnosis not present

## 2021-03-07 DIAGNOSIS — R7303 Prediabetes: Secondary | ICD-10-CM | POA: Diagnosis not present

## 2021-03-07 DIAGNOSIS — R2681 Unsteadiness on feet: Secondary | ICD-10-CM | POA: Diagnosis not present

## 2021-03-07 DIAGNOSIS — J302 Other seasonal allergic rhinitis: Secondary | ICD-10-CM | POA: Diagnosis not present

## 2021-03-07 DIAGNOSIS — E782 Mixed hyperlipidemia: Secondary | ICD-10-CM | POA: Diagnosis not present

## 2021-03-26 DIAGNOSIS — H838X3 Other specified diseases of inner ear, bilateral: Secondary | ICD-10-CM | POA: Diagnosis not present

## 2021-03-26 DIAGNOSIS — H903 Sensorineural hearing loss, bilateral: Secondary | ICD-10-CM | POA: Diagnosis not present

## 2021-04-02 ENCOUNTER — Ambulatory Visit: Payer: Medicare Other | Admitting: Physical Medicine and Rehabilitation

## 2021-04-08 IMAGING — DX DG CHEST 2V
2 series · 2 of 2 positions shown · non-contrast
Comparison: Radiographs 05/15/2018.

CLINICAL DATA: Dyspnea on exertion. History of asthma and diastolic
cardiac dysfunction.

EXAM:
CHEST - 2 VIEW

[dg chest 2 view (1 of 2)]
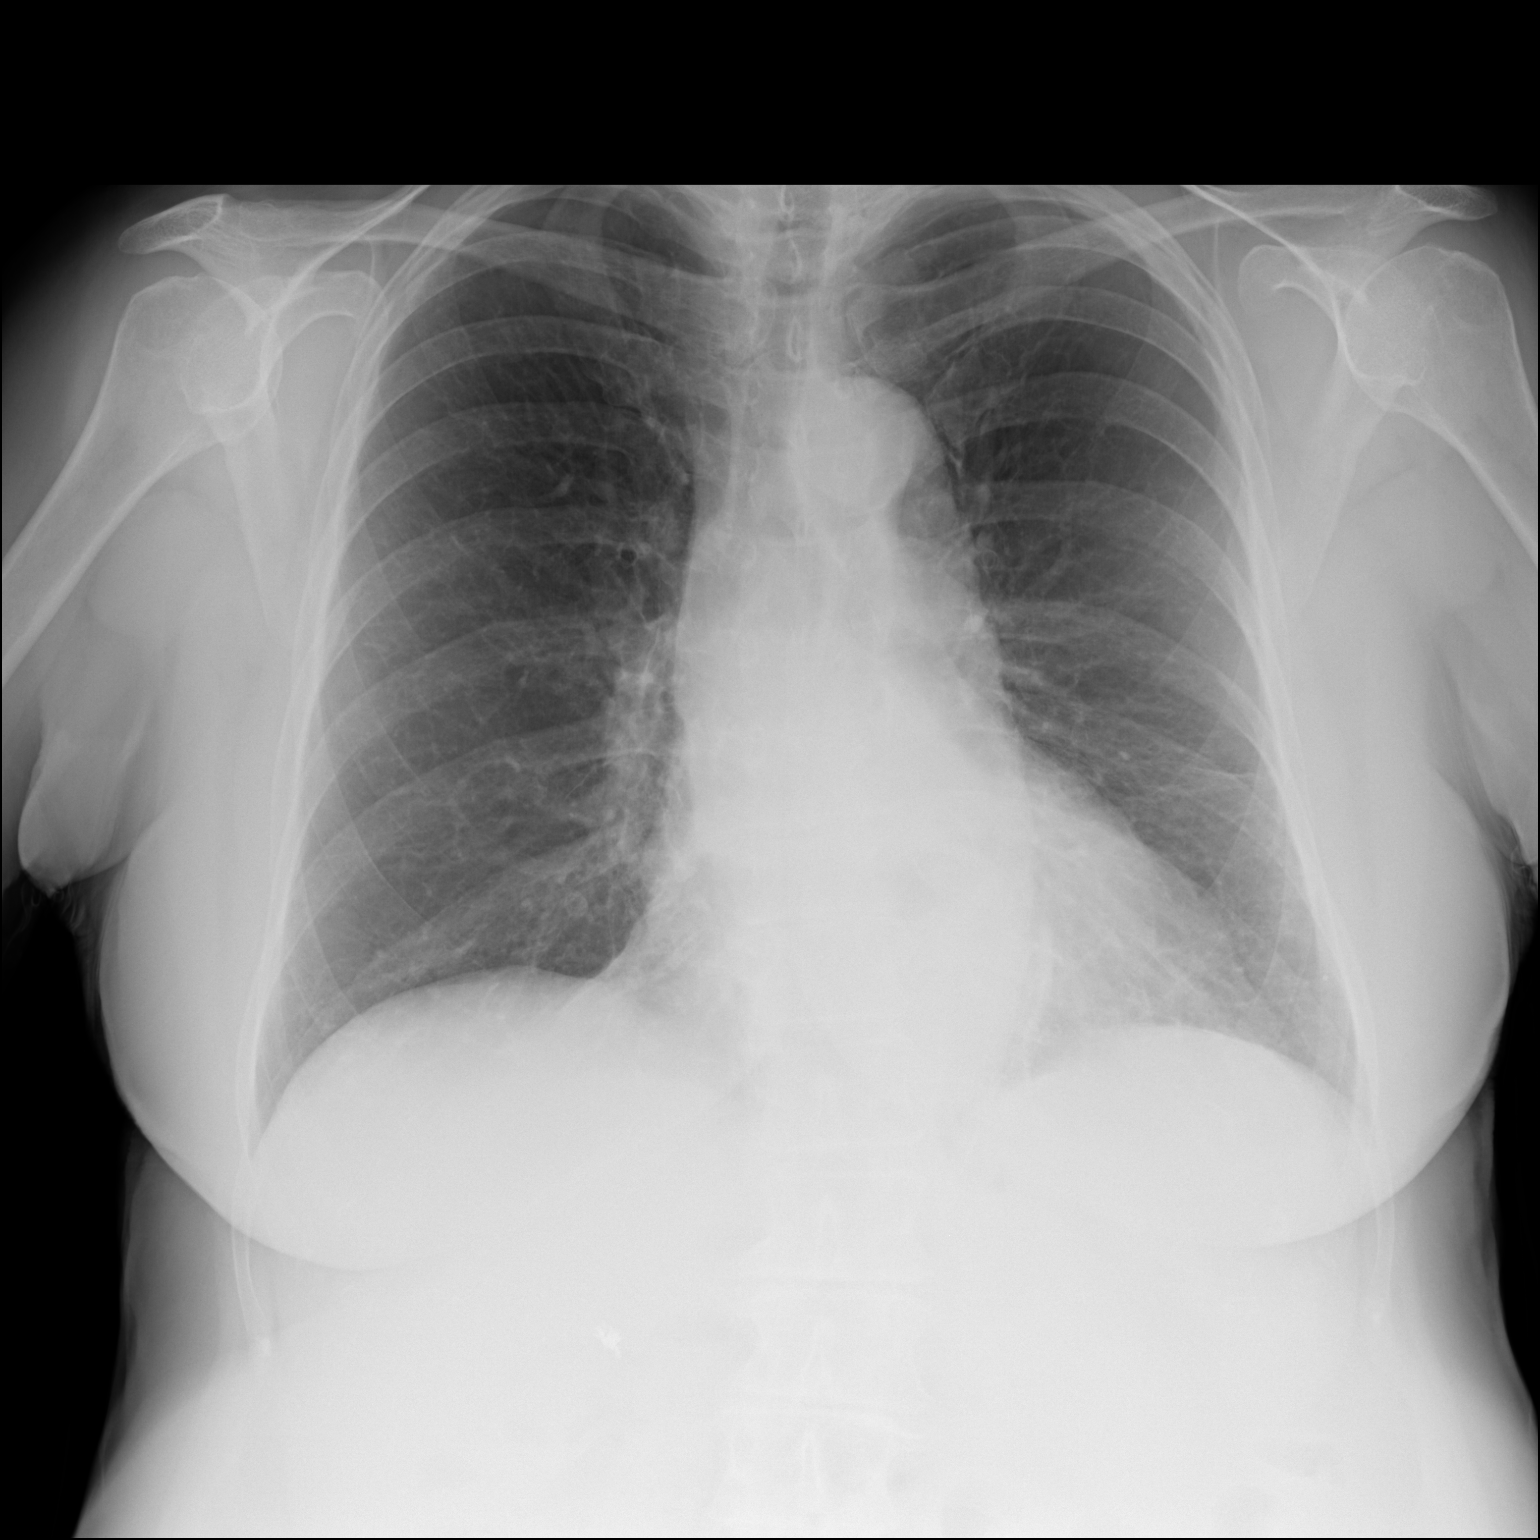

[dg chest 2 view (2 of 2)]
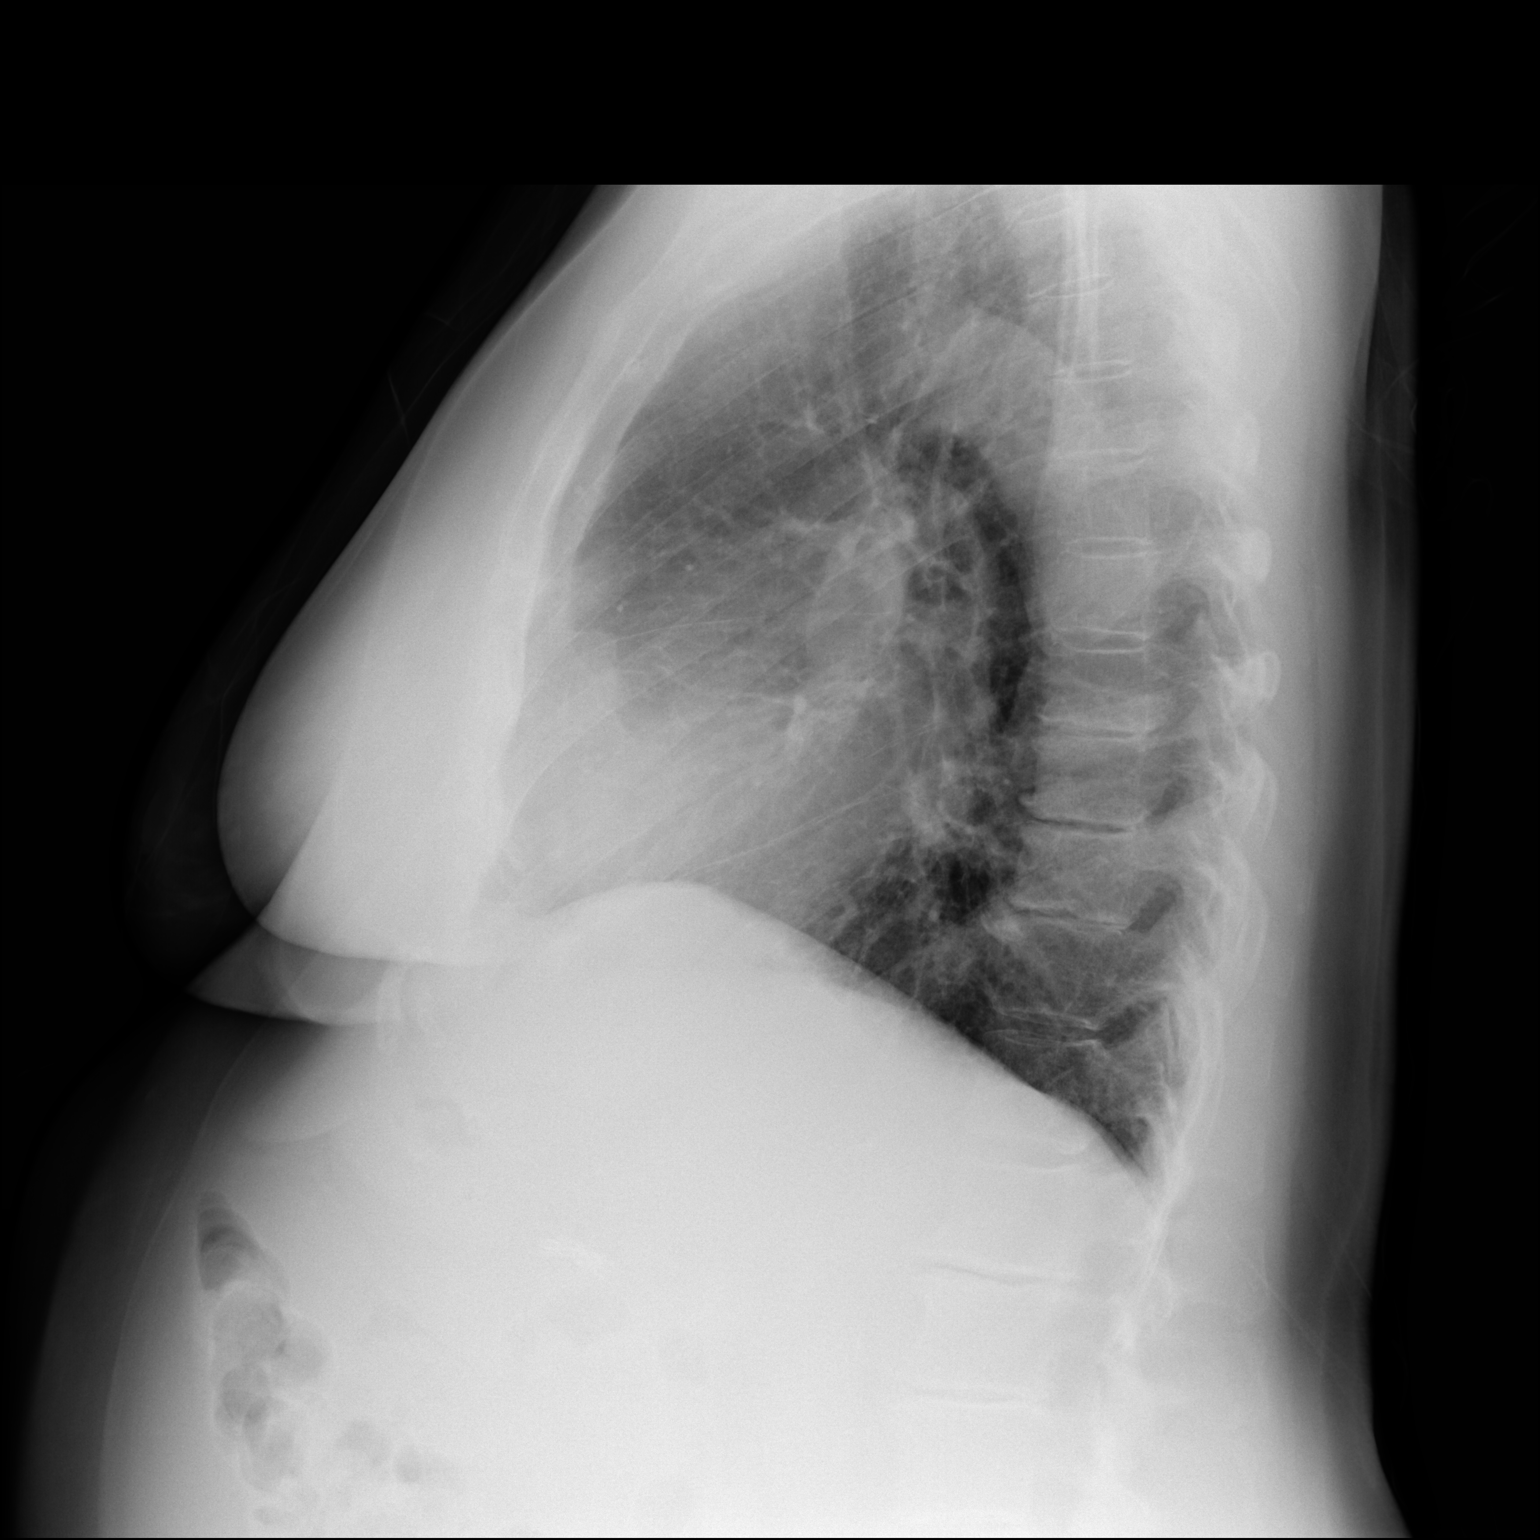

[2 of 2 positions shown; findings below may reference images not displayed]

FINDINGS: The heart size is stable at the upper limits of normal. The
mediastinal contours are stable with a small hiatal hernia. Mild
linear scarring is present at the left lung base. The lungs are
otherwise clear. There is no pleural effusion or pneumothorax. No
acute osseous findings are evident. There are mild degenerative
changes throughout the spine.
IMPRESSION: No active cardiopulmonary process. Small hiatal hernia.

## 2021-04-09 ENCOUNTER — Other Ambulatory Visit: Payer: Self-pay

## 2021-04-09 ENCOUNTER — Encounter (HOSPITAL_COMMUNITY): Payer: Self-pay | Admitting: Physical Therapy

## 2021-04-09 ENCOUNTER — Ambulatory Visit (HOSPITAL_COMMUNITY): Payer: Medicare Other | Attending: Family Medicine | Admitting: Physical Therapy

## 2021-04-09 DIAGNOSIS — R262 Difficulty in walking, not elsewhere classified: Secondary | ICD-10-CM

## 2021-04-09 DIAGNOSIS — M6281 Muscle weakness (generalized): Secondary | ICD-10-CM | POA: Insufficient documentation

## 2021-04-09 NOTE — Therapy (Signed)
[place], [place], [address] Phone: [phone]   Fax:  [phone]  Physical Therapy Evaluation  Patient Details  Name: Laura Carter MRN: [id number] Date of Birth: [birth year] Referring Provider (PT): Caren Macadam, MD   Encounter Date: 04/09/2021   PT End of Session - 04/09/21 1618     Visit Number 1    Number of Visits 12    Date for PT Re-Evaluation 05/21/21    Authorization Type medicare primary, BCBS secondary no auth    Progress Note Due on Visit 10    PT Start Time 1619    PT Stop Time 1656    PT Time Calculation (min) 37 min    Activity Tolerance Patient tolerated treatment well    Behavior During Therapy [facility] for tasks assessed/performed             Past Medical History:  Diagnosis Date   Asthma    Diastolic dysfunction    GERD (gastroesophageal reflux disease)    History of hiatal hernia    Hypertension    Osteoarthritis of left knee     Past Surgical History:  Procedure Laterality Date   BREAST BIOPSY Left    years ago- no scar ? needle biopsy only   CHOLECYSTECTOMY  10/10   COLONOSCOPY     ESOPHAGOGASTRODUODENOSCOPY     EYE SURGERY     cataract right   KNEE ARTHROSCOPY WITH MEDIAL MENISECTOMY Left 06/16/2015   Procedure: KNEE ARTHROSCOPY WITH PARTIAL MEDIAL MENISECTOMY;  Surgeon: Carole Civil, MD;  Location: [facility];  Service: Orthopedics;  Laterality: Left;   PARTIAL KNEE ARTHROPLASTY Left 07/08/2017   Procedure: UNICOMPARTMENTAL KNEE;  Surgeon: Renette Butters, MD;  Location: [facility];  Service: Orthopedics;  Laterality: Left;    There were no vitals filed for this visit.    Subjective Assessment - 04/09/21 1626     Subjective States that she has had walking difficulties for a good long time. States to start she has stenosis which affects her butt to her knees. States on the back of her left leg she has an area that is painful that she thinks is contributing her we  walking. States that she was told her hamstrings have a history of a tear. Reports she does Reiki, yoga, acupuncture, and chiropractic care to help with her overall mobility and pain. States that she is in pain management. Reports with her she has a leg length discrepancy from a previous joint replacement on the right. States she has an insert but she doesn't use it in the Summer because she wears sandal. Reports current pain is 5/10 in the back of the legs (bilaterally).    Pertinent History lumbar stenosis, B LE pain    Patient Stated Goals to walk better    Currently in Pain? Yes    Pain Score 5     Pain Location Leg    Pain Orientation Left;Right;Posterior    Pain Descriptors / Indicators Aching;Sore                [facility] PT Assessment - 04/09/21 0001       Assessment   Medical Diagnosis gait instability    Referring Provider (PT) Caren Macadam, MD    Prior Therapy yes for her knee replacement      Balance Screen   Has the patient fallen in the past 6 months No      [place]  Private residence    Home Access Stairs to enter    Entrance Stairs-Number of Steps 3    [place]   19 steps total - uses elevator     Cognition   Overall Cognitive Status Within Functional Limits for tasks assessed      Observation/Other Assessments   Focus on Therapeutic Outcomes (FOTO)  NA      ROM / Strength   AROM / PROM / Strength Strength      Strength   Strength Assessment Site Hip;Knee;Ankle    Right/Left Hip Left;Right    Right Hip Flexion 4-/5    Left Hip Flexion 4-/5    Right/Left Knee Right;Left    Right Knee Flexion 3+/5    Right Knee Extension 4-/5    Left Knee Flexion 3+/5    Left Knee Extension 4-/5    Right/Left Ankle Right;Left    Right Ankle Dorsiflexion 4/5    Left Ankle Dorsiflexion 4/5      Transfers   Comments 30 second sit to stance - 10 reps with arms crossed      Ambulation/Gait   Ambulation/Gait Yes     Ambulation/Gait Assistance 6: Modified independent (Device/Increase time)    Ambulation Distance (Feet) 286 Feet    Assistive device None    Gait Pattern Decreased stride length;Decreased hip/knee flexion - right;Decreased hip/knee flexion - left;Decreased dorsiflexion - right;Decreased dorsiflexion - left;Left foot flat;Trunk flexed;Wide base of support;Antalgic    Ambulation Surface Level;Indoor    Gait velocity reduced    Stairs Yes    Stairs Assistance 5: Supervision    Stair Management Technique Step to pattern;One rail Left   unsafe going down on left   Number of Stairs 4    Height of Stairs 7    Gait Comments 2MW increased pain along posterior thigh on left, shortness of breath noted afterwards; ambulated same distance with cane but less antalgic and woblly      Balance   Balance Assessed Yes      Static Standing Balance   Static Standing - Comment/# of Minutes SLS 14 seconds on the left, 7 seconds on the right                        Objective measurements completed on examination: See above findings.       [place] Adult PT Treatment/Exercise - 04/09/21 0001       Exercises   Exercises Knee/Hip      Knee/Hip Exercises: Standing   SLS x5 max attempt no UE support B      Knee/Hip Exercises: Seated   Sit to Sand 5 reps;1 set;without UE support                     PT Education - 04/09/21 1659     Education Details on use of cane when in community, on current presentation, on HEP and POC    Person(s) Educated Patient    Methods Explanation    Comprehension Verbalized understanding              PT Short Term Goals - 04/09/21 1627       PT SHORT TERM GOAL #1   Title Patient will be independent in self management strategies to improve quality of life and functional outcomes.    Time 3    Period Weeks    Status New    Target Date 04/30/21      PT  SHORT TERM GOAL #2   Title Patient will report at least 50% improvement in overall  symptoms and/or function to demonstrate improved functional mobility    Time 3    Period Weeks    Status New    Target Date 04/30/21      PT SHORT TERM GOAL #3   Title Patient will report walking with cane consistently when out of the house.    Time 3    Period Weeks    Status New    Target Date 04/30/21               PT Long Term Goals - 04/09/21 1627       PT LONG TERM GOAL #1   Title Patient will report at least 75% improvement in overall symptoms and/or function to demonstrate improved functional mobility    Time 6    Period Weeks    Status New    Target Date 05/21/21      PT LONG TERM GOAL #2   Title Patient will be able to go up and down stairs with reciprocal gait and use of one railing if needed to improve ability to go up the stairs at home.    Time 6    Period Weeks    Status New    Target Date 05/21/21      PT LONG TERM GOAL #3   Title Patient will report not feeling wobly when out shopping    Time 6    Period Weeks    Status New    Target Date 05/21/21                    Plan - 04/09/21 1636     Clinical Impression Statement Patient is an 80 y.o. female who presents to physical therapy with complaint of balance issues and difficulty walking in addition to chronic pain. Patient's goal is to focus on her strength, balance and walking.  Patient demonstrates decreased strength, balance deficits and gait abnormalities which are negatively impacting patient ability to perform ADLs and functional mobility tasks. Patient will benefit from skilled physical therapy services to address these deficits to improve level of function with ADLs, functional mobility tasks, and reduce risk for falls.    Personal Factors and Comorbidities Comorbidity 1;Comorbidity 2    Comorbidities lumbar stenosis, chronic low back    Examination-Activity Limitations Stand;Stairs;Squat;Locomotion Level;Transfers    Examination-Participation Restrictions Community Activity;Meal  Prep;Cleaning    Stability/Clinical Decision Making Stable/Uncomplicated    Clinical Decision Making Low    Rehab Potential Good    PT Frequency 2x / week    PT Duration 6 weeks    PT Treatment/Interventions ADLs/Self Care Home Management;Aquatic Therapy;Therapeutic exercise;Therapeutic activities;Patient/family education;Neuromuscular re-education;DME Instruction;Gait training;Stair training;Moist Heat;Cryotherapy    PT Next Visit Plan strength, balance and walking mechanics - do not focus on pain (has already tried multiple health providers for her leg pain).    PT Home Exercise Plan STS, SLS    Consulted and Agree with Plan of Care Patient             Patient will benefit from skilled therapeutic intervention in order to improve the following deficits and impairments:  Abnormal gait, Pain, Decreased activity tolerance, Decreased balance, Decreased mobility, Difficulty walking, Postural dysfunction, Decreased endurance, Decreased strength, Decreased knowledge of use of DME  Visit Diagnosis: Difficulty in walking, not elsewhere classified - Plan: PT plan of care cert/re-cert  Muscle weakness (generalized) - Plan: PT plan  of care cert/re-cert     Problem List Patient Active Problem List   Diagnosis Date Noted   Leg length discrepancy 07/12/2020   Spinal stenosis, lumbar region, with neurogenic claudication 04/03/2020   Nerve pain 04/03/2020   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

## 2021-04-11 ENCOUNTER — Ambulatory Visit (HOSPITAL_COMMUNITY): Payer: Medicare Other | Admitting: Physical Therapy

## 2021-04-11 ENCOUNTER — Other Ambulatory Visit: Payer: Self-pay

## 2021-04-11 DIAGNOSIS — M6281 Muscle weakness (generalized): Secondary | ICD-10-CM | POA: Diagnosis not present

## 2021-04-11 DIAGNOSIS — R262 Difficulty in walking, not elsewhere classified: Secondary | ICD-10-CM

## 2021-04-11 NOTE — Therapy (Signed)
[place], [place], [address] Phone: [phone]   Fax:  [phone]  Physical Therapy Treatment  Patient Details  Name: Laura Carter MRN: [id number] Date of Birth: [birth year] Referring Provider (PT): Caren Macadam, MD   Encounter Date: 04/11/2021   PT End of Session - 04/11/21 1620     Visit Number 2    Number of Visits 12    Date for PT Re-Evaluation 05/21/21    Authorization Type medicare primary, BCBS secondary no auth    Progress Note Due on Visit 10    PT Start Time 1532    PT Stop Time 0998    PT Time Calculation (min) 43 min    Activity Tolerance Patient tolerated treatment well    Behavior During Therapy [facility] for tasks assessed/performed             Past Medical History:  Diagnosis Date   Asthma    Diastolic dysfunction    GERD (gastroesophageal reflux disease)    History of hiatal hernia    Hypertension    Osteoarthritis of left knee     Past Surgical History:  Procedure Laterality Date   BREAST BIOPSY Left    years ago- no scar ? needle biopsy only   CHOLECYSTECTOMY  10/10   COLONOSCOPY     ESOPHAGOGASTRODUODENOSCOPY     EYE SURGERY     cataract right   KNEE ARTHROSCOPY WITH MEDIAL MENISECTOMY Left 06/16/2015   Procedure: KNEE ARTHROSCOPY WITH PARTIAL MEDIAL MENISECTOMY;  Surgeon: Carole Civil, MD;  Location: [facility];  Service: Orthopedics;  Laterality: Left;   PARTIAL KNEE ARTHROPLASTY Left 07/08/2017   Procedure: UNICOMPARTMENTAL KNEE;  Surgeon: Renette Butters, MD;  Location: [facility];  Service: Orthopedics;  Laterality: Left;    There were no vitals filed for this visit.   Subjective Assessment - 04/11/21 1540     Subjective Pt states she is a little tender today; stenosis bothers her Rt side and still with Lt hamstring discomfort from "tear".  Feels like alot of her difficulties have come from COVID    Pain Score 6     Pain Location Leg    Pain Orientation  Right;Left                               [facility] Adult PT Treatment/Exercise - 04/11/21 0001       Knee/Hip Exercises: Standing   SLS x5 max attempt no UE support B    SLS with Vectors 5X3" holds with 1 UE each LE      Knee/Hip Exercises: Seated   Sit to Sand 5 reps;1 set;without UE support      Knee/Hip Exercises: Supine   Bridges Both;10 reps    Straight Leg Raises Both;10 reps                       PT Short Term Goals - 04/09/21 1627       PT SHORT TERM GOAL #1   Title Patient will be independent in self management strategies to improve quality of life and functional outcomes.    Time 3    Period Weeks    Status New    Target Date 04/30/21      PT SHORT TERM GOAL #2   Title Patient will report at least 50% improvement in overall symptoms and/or function to demonstrate improved functional  mobility    Time 3    Period Weeks    Status New    Target Date 04/30/21      PT SHORT TERM GOAL #3   Title Patient will report walking with cane consistently when out of the house.    Time 3    Period Weeks    Status New    Target Date 04/30/21               PT Long Term Goals - 04/09/21 1627       PT LONG TERM GOAL #1   Title Patient will report at least 75% improvement in overall symptoms and/or function to demonstrate improved functional mobility    Time 6    Period Weeks    Status New    Target Date 05/21/21      PT LONG TERM GOAL #2   Title Patient will be able to go up and down stairs with reciprocal gait and use of one railing if needed to improve ability to go up the stairs at home.    Time 6    Period Weeks    Status New    Target Date 05/21/21      PT LONG TERM GOAL #3   Title Patient will report not feeling wobly when out shopping    Time 6    Period Weeks    Status New    Target Date 05/21/21                   Plan - 04/11/21 1620     Clinical Impression Statement Reviewed goals and POC moving  forward.  Pt able to demonstrate Hep correctly with cues for eccentric lowering with sit to stand activity.  8" max SLS hold for bil LE today without UE assist.  Added vectors to help improve gluteal strength.  Bridging with focus on quality contraction rather than lifting off mat as well as SLR with core activation also added.  Pt given print outs of these activities to add to Hep.    Personal Factors and Comorbidities Comorbidity 1;Comorbidity 2    Comorbidities lumbar stenosis, chronic low back    Examination-Activity Limitations Stand;Stairs;Squat;Locomotion Level;Transfers    Examination-Participation Restrictions Community Activity;Meal Prep;Cleaning    Stability/Clinical Decision Making Stable/Uncomplicated    Rehab Potential Good    PT Frequency 2x / week    PT Duration 6 weeks    PT Treatment/Interventions ADLs/Self Care Home Management;Aquatic Therapy;Therapeutic exercise;Therapeutic activities;Patient/family education;Neuromuscular re-education;DME Instruction;Gait training;Stair training;Moist Heat;Cryotherapy    PT Next Visit Plan continue to improve strength, balance and walking mechanics - do not focus on pain (has already tried multiple health providers for her leg pain).    PT Home Exercise Plan STS, SLS    Consulted and Agree with Plan of Care Patient             Patient will benefit from skilled therapeutic intervention in order to improve the following deficits and impairments:  Abnormal gait, Pain, Decreased activity tolerance, Decreased balance, Decreased mobility, Difficulty walking, Postural dysfunction, Decreased endurance, Decreased strength, Decreased knowledge of use of DME  Visit Diagnosis: Difficulty in walking, not elsewhere classified  Muscle weakness (generalized)     Problem List Patient Active Problem List   Diagnosis Date Noted   Leg length discrepancy 07/12/2020   Spinal stenosis, lumbar region, with neurogenic claudication 04/03/2020   Nerve  pain 04/03/2020   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 [place], [place], [address] Phone: [phone]   Fax:  [phone]  Name: Laura Carter MRN: [id number] Date of Birth: 01/28/1941

## 2021-04-16 ENCOUNTER — Encounter (HOSPITAL_COMMUNITY): Payer: Self-pay

## 2021-04-16 ENCOUNTER — Ambulatory Visit (HOSPITAL_COMMUNITY): Payer: Medicare Other

## 2021-04-16 ENCOUNTER — Other Ambulatory Visit: Payer: Self-pay

## 2021-04-16 DIAGNOSIS — M6281 Muscle weakness (generalized): Secondary | ICD-10-CM | POA: Diagnosis not present

## 2021-04-16 DIAGNOSIS — R262 Difficulty in walking, not elsewhere classified: Secondary | ICD-10-CM

## 2021-04-16 NOTE — Therapy (Signed)
[place], [place], [address] Phone: [phone]   Fax:  [phone]  Physical Therapy Treatment  Patient Details  Name: Laura Carter MRN: [id number] Date of Birth: [birth year] Referring Provider (PT): Caren Macadam, MD   Encounter Date: 04/16/2021   PT End of Session - 04/16/21 1027     Visit Number 3    Number of Visits 12    Date for PT Re-Evaluation 05/21/21    Authorization Type medicare primary, BCBS secondary no auth    Progress Note Due on Visit 10    PT Start Time 1029    PT Stop Time 1108    PT Time Calculation (min) 39 min    Activity Tolerance Patient tolerated treatment well    Behavior During Therapy [facility] for tasks assessed/performed             Past Medical History:  Diagnosis Date   Asthma    Diastolic dysfunction    GERD (gastroesophageal reflux disease)    History of hiatal hernia    Hypertension    Osteoarthritis of left knee     Past Surgical History:  Procedure Laterality Date   BREAST BIOPSY Left    years ago- no scar ? needle biopsy only   CHOLECYSTECTOMY  10/10   COLONOSCOPY     ESOPHAGOGASTRODUODENOSCOPY     EYE SURGERY     cataract right   KNEE ARTHROSCOPY WITH MEDIAL MENISECTOMY Left 06/16/2015   Procedure: KNEE ARTHROSCOPY WITH PARTIAL MEDIAL MENISECTOMY;  Surgeon: Carole Civil, MD;  Location: [facility];  Service: Orthopedics;  Laterality: Left;   PARTIAL KNEE ARTHROPLASTY Left 07/08/2017   Procedure: UNICOMPARTMENTAL KNEE;  Surgeon: Renette Butters, MD;  Location: [facility];  Service: Orthopedics;  Laterality: Left;    There were no vitals filed for this visit.   Subjective Assessment - 04/16/21 1033     Subjective Patient reports she did a lot of packing standing and bent over Saturday with increased pain Sunday. Feeling quite a bit better today; not sure if it is due to the weather or exercises are helping.    Currently in Pain? Yes    Pain Score 4      Pain Location Leg    Pain Orientation Left;Posterior;Upper                [facility] Adult PT Treatment/Exercise - 04/16/21 0001       Knee/Hip Exercises: Standing   Other Standing Knee Exercises tandem stance balance 30 sec x1 each (on back toes) intermittent UE assist    Other Standing Knee Exercises lumbar extension x10      Knee/Hip Exercises: Seated   Sit to Sand 1 set;without UE support;10 reps      Knee/Hip Exercises: Supine   Bridges Strengthening;Both;1 set;10 reps   ab set first   [employer] Limitations 5 sec hold    Straight Leg Raises Both;10 reps;5 reps    Other Supine Knee/Hip Exercises ab set 5 sec hold x10                     PT Education - 04/16/21 1038     Education Details Discussed purpose and technique of skilled interventions throughout session. Advanced HEP.    Person(s) Educated Patient    Methods Explanation;Handout    Comprehension Verbalized understanding              PT Short Term Goals - 04/16/21 1247  PT SHORT TERM GOAL #1   Title Patient will be independent in self management strategies to improve quality of life and functional outcomes.    Time 3    Period Weeks    Status On-going    Target Date 04/30/21      PT SHORT TERM GOAL #2   Title Patient will report at least 50% improvement in overall symptoms and/or function to demonstrate improved functional mobility    Time 3    Period Weeks    Status On-going    Target Date 04/30/21      PT SHORT TERM GOAL #3   Title Patient will report walking with cane consistently when out of the house.    Time 3    Period Weeks    Status On-going    Target Date 04/30/21               PT Long Term Goals - 04/16/21 1247       PT LONG TERM GOAL #1   Title Patient will report at least 75% improvement in overall symptoms and/or function to demonstrate improved functional mobility    Time 6    Period Weeks    Status On-going      PT LONG TERM GOAL #2   Title Patient  will be able to go up and down stairs with reciprocal gait and use of one railing if needed to improve ability to go up the stairs at home.    Time 6    Period Weeks    Status On-going      PT LONG TERM GOAL #3   Title Patient will report not feeling wobly when out shopping    Time 6    Period Weeks    Status On-going                   Plan - 04/16/21 1028     Clinical Impression Statement Session focused on neuromuscular re-education of transversus abdominus and strengthening of the core/trunk and lower extremities. Patient required additional time to perform exercises but was able to complete with increased repetitions and holds today. Added abdominal isometric and tandem stance balance to HEP. Handout issued. Reviewed supine SLR for correct performance. Patient had increased complaints of left hamstring pain at end of session. PT had patient perform 10 standing lumbar extensions at end of session with patient stating that made her left hamstring area hurt more. Patient may benefit from further assessment of flexion vs extension biased exercises next session. Patient would continue to benefit from skilled physical therapy to reduce impairment and improve functional abilities.    Personal Factors and Comorbidities Comorbidity 1;Comorbidity 2    Comorbidities lumbar stenosis, chronic low back    Examination-Activity Limitations Stand;Stairs;Squat;Locomotion Level;Transfers    Examination-Participation Restrictions Community Activity;Meal Prep;Cleaning    Stability/Clinical Decision Making Stable/Uncomplicated    Rehab Potential Good    PT Frequency 2x / week    PT Duration 6 weeks    PT Treatment/Interventions ADLs/Self Care Home Management;Aquatic Therapy;Therapeutic exercise;Therapeutic activities;Patient/family education;Neuromuscular re-education;DME Instruction;Gait training;Stair training;Moist Heat;Cryotherapy    PT Next Visit Plan continue to improve strength, balance and  walking mechanics - do not focus on pain (has already tried multiple health providers for her leg pain); flexion vs extenstion biased exercises?    PT Home Exercise Plan STS, SLS; 11/21 - ab set, tandem stance on back toe    Consulted and Agree with Plan of Care Patient  Patient will benefit from skilled therapeutic intervention in order to improve the following deficits and impairments:  Abnormal gait, Pain, Decreased activity tolerance, Decreased balance, Decreased mobility, Difficulty walking, Postural dysfunction, Decreased endurance, Decreased strength, Decreased knowledge of use of DME  Visit Diagnosis: Difficulty in walking, not elsewhere classified  Muscle weakness (generalized)     Problem List Patient Active Problem List   Diagnosis Date Noted   Leg length discrepancy 07/12/2020   Spinal stenosis, lumbar region, with neurogenic claudication 04/03/2020   Nerve pain 04/03/2020   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 Hartnett-Rands, MS, PT Per [place] [phone]  Jeannie Done, PT 04/16/2021, 12:49 PM  [facility] [address] [place], [place], [address] Phone: [phone]   Fax:  [phone]  Name: Laura Carter MRN: [id number] Date of Birth: July 28, 1940

## 2021-04-16 NOTE — Patient Instructions (Addendum)
Isometric Abdominal Contraction    Tuck in stomach muscles and push low back against back of chair. Hold _5___ seconds while counting out loud. Repeat _10___ times. Do _1___ sessions per day.  http://gt2.exer.us/624   Copyright  VHI. All rights reserved.   Isometric Abdominal    Lying on back with knees bent, tighten stomach by pressing elbows down. Hold _5___ seconds. Repeat _10___ times per set. Do __1__ sets per session. Do _1___ sessions per day.  http://orth.exer.us/1087   Copyright  VHI. All rights reserved.   Tandem Stance    Right foot in front of left, heel touching toe both feet "straight ahead". Try to just use toes of back foot from help with balancing. Stand on Foot Triangle of Support with both feet. Balance in this position _30__ seconds. Do with left foot in front of right.  Copyright  VHI. All rights reserved.

## 2021-04-18 ENCOUNTER — Ambulatory Visit (HOSPITAL_COMMUNITY): Payer: Medicare Other | Admitting: Physical Therapy

## 2021-04-20 DIAGNOSIS — J069 Acute upper respiratory infection, unspecified: Secondary | ICD-10-CM | POA: Diagnosis not present

## 2021-04-20 DIAGNOSIS — J029 Acute pharyngitis, unspecified: Secondary | ICD-10-CM | POA: Diagnosis not present

## 2021-04-20 DIAGNOSIS — Z20822 Contact with and (suspected) exposure to covid-19: Secondary | ICD-10-CM | POA: Diagnosis not present

## 2021-04-20 DIAGNOSIS — J019 Acute sinusitis, unspecified: Secondary | ICD-10-CM | POA: Diagnosis not present

## 2021-04-20 DIAGNOSIS — J209 Acute bronchitis, unspecified: Secondary | ICD-10-CM | POA: Diagnosis not present

## 2021-04-23 ENCOUNTER — Other Ambulatory Visit: Payer: Self-pay

## 2021-04-23 ENCOUNTER — Ambulatory Visit (HOSPITAL_COMMUNITY): Payer: Medicare Other

## 2021-04-23 ENCOUNTER — Encounter (HOSPITAL_COMMUNITY): Payer: Self-pay

## 2021-04-23 DIAGNOSIS — R262 Difficulty in walking, not elsewhere classified: Secondary | ICD-10-CM | POA: Diagnosis not present

## 2021-04-23 DIAGNOSIS — M6281 Muscle weakness (generalized): Secondary | ICD-10-CM | POA: Diagnosis not present

## 2021-04-23 NOTE — Therapy (Signed)
[place], [place], [address] Phone: [phone]   Fax:  [phone]  Physical Therapy Treatment  Patient Details  Name: Laura Carter MRN: [id number] Date of Birth: 1940/11/18 Referring Provider (PT): Caren Macadam, MD   Encounter Date: 04/23/2021   PT End of Session - 04/23/21 1627     Visit Number 4    Number of Visits 12    Date for PT Re-Evaluation 05/21/21    Authorization Type medicare primary, BCBS secondary no auth    Progress Note Due on Visit 10    PT Start Time 1610    PT Stop Time 3532    PT Time Calculation (min) 48 min    Activity Tolerance Patient tolerated treatment well    Behavior During Therapy [facility] for tasks assessed/performed             Past Medical History:  Diagnosis Date   Asthma    Diastolic dysfunction    GERD (gastroesophageal reflux disease)    History of hiatal hernia    Hypertension    Osteoarthritis of left knee     Past Surgical History:  Procedure Laterality Date   BREAST BIOPSY Left    years ago- no scar ? needle biopsy only   CHOLECYSTECTOMY  10/10   COLONOSCOPY     ESOPHAGOGASTRODUODENOSCOPY     EYE SURGERY     cataract right   KNEE ARTHROSCOPY WITH MEDIAL MENISECTOMY Left 06/16/2015   Procedure: KNEE ARTHROSCOPY WITH PARTIAL MEDIAL MENISECTOMY;  Surgeon: Carole Civil, MD;  Location: [facility];  Service: Orthopedics;  Laterality: Left;   PARTIAL KNEE ARTHROPLASTY Left 07/08/2017   Procedure: UNICOMPARTMENTAL KNEE;  Surgeon: Renette Butters, MD;  Location: [facility];  Service: Orthopedics;  Laterality: Left;    There were no vitals filed for this visit.   Subjective Assessment - 04/23/21 1617     Subjective Pt stated she is tired today, received massage this morning that made her relaxed and has been busy today and over weekend.    Pertinent History lumbar stenosis, B LE pain    Patient Stated Goals to walk better    Currently in Pain? Yes     Pain Score 4     Pain Location Leg    Pain Orientation Left    Pain Descriptors / Indicators Aching;Sore    Pain Type Chronic pain    Pain Onset More than a month ago    Pain Frequency Intermittent    Aggravating Factors  Depends on the day    Pain Relieving Factors Getting off it                               [facility] Adult PT Treatment/Exercise - 04/23/21 0001       Exercises   Exercises Knee/Hip      Knee/Hip Exercises: Stretches   Active Hamstring Stretch Left;1 rep;30 seconds      Knee/Hip Exercises: Standing   Other Standing Knee Exercises [place] in partial tandem stance 2x 10 reps;    Other Standing Knee Exercises toe tapping 6in alternating 20x; sidestep 2RT      Knee/Hip Exercises: Seated   Sit to Sand 1 set;without UE support;10 reps      Knee/Hip Exercises: Supine   Bridges Strengthening;10 reps    Bridges Limitations 5 sec hold    Straight Leg Raises Both;10 reps;5 reps  Other Supine Knee/Hip Exercises ab set 5 sec hold x10                       PT Short Term Goals - 04/16/21 1247       PT SHORT TERM GOAL #1   Title Patient will be independent in self management strategies to improve quality of life and functional outcomes.    Time 3    Period Weeks    Status On-going    Target Date 04/30/21      PT SHORT TERM GOAL #2   Title Patient will report at least 50% improvement in overall symptoms and/or function to demonstrate improved functional mobility    Time 3    Period Weeks    Status On-going    Target Date 04/30/21      PT SHORT TERM GOAL #3   Title Patient will report walking with cane consistently when out of the house.    Time 3    Period Weeks    Status On-going    Target Date 04/30/21               PT Long Term Goals - 04/16/21 1247       PT LONG TERM GOAL #1   Title Patient will report at least 75% improvement in overall symptoms and/or function to demonstrate improved functional mobility     Time 6    Period Weeks    Status On-going      PT LONG TERM GOAL #2   Title Patient will be able to go up and down stairs with reciprocal gait and use of one railing if needed to improve ability to go up the stairs at home.    Time 6    Period Weeks    Status On-going      PT LONG TERM GOAL #3   Title Patient will report not feeling wobly when out shopping    Time 6    Period Weeks    Status On-going                   Plan - 04/23/21 1647     Clinical Impression Statement Progressed core stability with additional pallof exercises with min cueing for form and mechanics.  Added standing balalnce activities as well including toe tapping and sidestep for core and gluteal strengthening.  Pt reports increased Lt hamstring pain following lumbar extension, no reports of leg pain with flexion based exercises this session.  Pt was limited by fatigue, periodic rest breaks required through session.    Personal Factors and Comorbidities Comorbidity 1;Comorbidity 2    Comorbidities lumbar stenosis, chronic low back    Examination-Activity Limitations Stand;Stairs;Squat;Locomotion Level;Transfers    Examination-Participation Restrictions Community Activity;Meal Prep;Cleaning    Stability/Clinical Decision Making Stable/Uncomplicated    Clinical Decision Making Low    Rehab Potential Good    PT Frequency 2x / week    PT Duration 6 weeks    PT Treatment/Interventions ADLs/Self Care Home Management;Aquatic Therapy;Therapeutic exercise;Therapeutic activities;Patient/family education;Neuromuscular re-education;DME Instruction;Gait training;Stair training;Moist Heat;Cryotherapy    PT Next Visit Plan continue to improve strength, balance and walking mechanics - do not focus on pain (has already tried multiple health providers for her leg pain); flexion vs extenstion biased exercises?    PT Home Exercise Plan STS, SLS; 11/21 - ab set, tandem stance on back toe    Consulted and Agree with Plan of  Care Patient  Patient will benefit from skilled therapeutic intervention in order to improve the following deficits and impairments:  Abnormal gait, Pain, Decreased activity tolerance, Decreased balance, Decreased mobility, Difficulty walking, Postural dysfunction, Decreased endurance, Decreased strength, Decreased knowledge of use of DME  Visit Diagnosis: Difficulty in walking, not elsewhere classified  Muscle weakness (generalized)     Problem List Patient Active Problem List   Diagnosis Date Noted   Leg length discrepancy 07/12/2020   Spinal stenosis, lumbar region, with neurogenic claudication 04/03/2020   Nerve pain 04/03/2020   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

## 2021-04-26 ENCOUNTER — Other Ambulatory Visit: Payer: Self-pay

## 2021-04-26 ENCOUNTER — Ambulatory Visit (HOSPITAL_COMMUNITY): Payer: Medicare Other | Attending: Family Medicine | Admitting: Physical Therapy

## 2021-04-26 DIAGNOSIS — M6281 Muscle weakness (generalized): Secondary | ICD-10-CM | POA: Insufficient documentation

## 2021-04-26 DIAGNOSIS — R262 Difficulty in walking, not elsewhere classified: Secondary | ICD-10-CM | POA: Insufficient documentation

## 2021-04-26 NOTE — Therapy (Signed)
[place], [place], [address] Phone: [phone]   Fax:  [phone]  Physical Therapy Treatment  Patient Details  Name: Laura Carter MRN: [id number] Date of Birth: 09-03-1940 Referring Provider (PT): Caren Macadam, MD   Encounter Date: 04/26/2021   PT End of Session - 04/26/21 1041     Visit Number 5    Number of Visits 12    Date for PT Re-Evaluation 05/21/21    Authorization Type medicare primary, BCBS secondary no auth    Progress Note Due on Visit 10    PT Start Time 1002    PT Stop Time 2751    PT Time Calculation (min) 39 min    Activity Tolerance Patient tolerated treatment well    Behavior During Therapy WFL for tasks assessed/performed             Past Medical History:  Diagnosis Date   Asthma    Diastolic dysfunction    GERD (gastroesophageal reflux disease)    History of hiatal hernia    Hypertension    Osteoarthritis of left knee     Past Surgical History:  Procedure Laterality Date   BREAST BIOPSY Left    years ago- no scar ? needle biopsy only   CHOLECYSTECTOMY  10/10   COLONOSCOPY     ESOPHAGOGASTRODUODENOSCOPY     EYE SURGERY     cataract right   KNEE ARTHROSCOPY WITH MEDIAL MENISECTOMY Left 06/16/2015   Procedure: KNEE ARTHROSCOPY WITH PARTIAL MEDIAL MENISECTOMY;  Surgeon: Carole Civil, MD;  Location: [facility];  Service: Orthopedics;  Laterality: Left;   PARTIAL KNEE ARTHROPLASTY Left 07/08/2017   Procedure: UNICOMPARTMENTAL KNEE;  Surgeon: Renette Butters, MD;  Location: [facility];  Service: Orthopedics;  Laterality: Left;    There were no vitals filed for this visit.   Subjective Assessment - 04/26/21 1010     Subjective States she has been wearing her body down with Christmas decoration. States she has been trying to do her exercises and the stepping one was difficulty as her steps are higher. Reports her right has been catching and giving a lot.    Pertinent  History lumbar stenosis, B LE pain    Patient Stated Goals to walk better    Currently in Pain? Yes    Pain Location Leg    Pain Orientation Left    Pain Descriptors / Indicators Aching    Pain Onset More than a month ago                [facility] PT Assessment - 04/26/21 0001       Assessment   Medical Diagnosis gait instability    Referring Provider (PT) Caren Macadam, MD                           [facility] Adult PT Treatment/Exercise - 04/26/21 0001       Knee/Hip Exercises: Stretches   Active Hamstring Stretch Both;3 reps;30 seconds   seated     Knee/Hip Exercises: Standing   Other Standing Knee Exercises lateral stepping in mini squat with UE support x10 of 6 feet B    Other Standing Knee Exercises tandem x3 30" holds      Knee/Hip Exercises: Seated   Long Arc Quad AROM;3 sets;5 reps;Both   5" holds   Sit to Sand 1 set;without UE support;5 reps   slow lower  PT Education - 04/26/21 1039     Education Details Educated patient in safety, exercises, how and why to perform, on current presentation.    Person(s) Educated Patient    Methods Explanation    Comprehension Verbalized understanding              PT Short Term Goals - 04/16/21 1247       PT SHORT TERM GOAL #1   Title Patient will be independent in self management strategies to improve quality of life and functional outcomes.    Time 3    Period Weeks    Status On-going    Target Date 04/30/21      PT SHORT TERM GOAL #2   Title Patient will report at least 50% improvement in overall symptoms and/or function to demonstrate improved functional mobility    Time 3    Period Weeks    Status On-going    Target Date 04/30/21      PT SHORT TERM GOAL #3   Title Patient will report walking with cane consistently when out of the house.    Time 3    Period Weeks    Status On-going    Target Date 04/30/21               PT Long Term Goals - 04/16/21 1247        PT LONG TERM GOAL #1   Title Patient will report at least 75% improvement in overall symptoms and/or function to demonstrate improved functional mobility    Time 6    Period Weeks    Status On-going      PT LONG TERM GOAL #2   Title Patient will be able to go up and down stairs with reciprocal gait and use of one railing if needed to improve ability to go up the stairs at home.    Time 6    Period Weeks    Status On-going      PT LONG TERM GOAL #3   Title Patient will report not feeling wobly when out shopping    Time 6    Period Weeks    Status On-going                   Plan - 04/26/21 1039     Clinical Impression Statement Educated patient on adding height to ground to reduce overall step height. Encouraged patient to continue to perform balance exercises at home as she tends to not do them as they are the most challenging. verbal and tactile cues throughout session to use arms/hands as needed and why she needs to use hands to assist as right knee tends to give way with prolonged weightbearing. Fatigue in leg muscles noted end session.    Personal Factors and Comorbidities Comorbidity 1;Comorbidity 2    Comorbidities lumbar stenosis, chronic low back    Examination-Activity Limitations Stand;Stairs;Squat;Locomotion Level;Transfers    Examination-Participation Restrictions Community Activity;Meal Prep;Cleaning    Stability/Clinical Decision Making Stable/Uncomplicated    Rehab Potential Good    PT Frequency 2x / week    PT Duration 6 weeks    PT Treatment/Interventions ADLs/Self Care Home Management;Aquatic Therapy;Therapeutic exercise;Therapeutic activities;Patient/family education;Neuromuscular re-education;DME Instruction;Gait training;Stair training;Moist Heat;Cryotherapy    PT Next Visit Plan continue to improve strength, balance and walking mechanics - do not focus on pain (has already tried multiple health providers for her leg pain); flexion vs extenstion  biased exercises?    PT Home Exercise Plan STS, SLS; 11/21 -  ab set, tandem stance on back toe; lateral stepping in mini squat; LAQs    Consulted and Agree with Plan of Care Patient             Patient will benefit from skilled therapeutic intervention in order to improve the following deficits and impairments:  Abnormal gait, Pain, Decreased activity tolerance, Decreased balance, Decreased mobility, Difficulty walking, Postural dysfunction, Decreased endurance, Decreased strength, Decreased knowledge of use of DME  Visit Diagnosis: Difficulty in walking, not elsewhere classified  Muscle weakness (generalized)     Problem List Patient Active Problem List   Diagnosis Date Noted   Leg length discrepancy 07/12/2020   Spinal stenosis, lumbar region, with neurogenic claudication 04/03/2020   Nerve pain 04/03/2020   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 [place], [place], [address] Phone: [phone]   Fax:  [phone]  Name: Laura Carter MRN: [id number] Date of Birth: 11-27-1940

## 2021-04-30 ENCOUNTER — Other Ambulatory Visit: Payer: Self-pay

## 2021-04-30 ENCOUNTER — Encounter (HOSPITAL_COMMUNITY): Payer: Self-pay | Admitting: Physical Therapy

## 2021-04-30 ENCOUNTER — Ambulatory Visit (HOSPITAL_COMMUNITY): Payer: Medicare Other | Admitting: Physical Therapy

## 2021-04-30 DIAGNOSIS — M6281 Muscle weakness (generalized): Secondary | ICD-10-CM | POA: Diagnosis not present

## 2021-04-30 DIAGNOSIS — R262 Difficulty in walking, not elsewhere classified: Secondary | ICD-10-CM | POA: Diagnosis not present

## 2021-04-30 NOTE — Therapy (Signed)
[place], [place], [address] Phone: [phone]   Fax:  [phone]  Physical Therapy Treatment  Patient Details  Name: Laura Carter MRN: [id number] Date of Birth: 12-23-1940 Referring Provider (PT): Caren Macadam, MD   Encounter Date: 04/30/2021   PT End of Session - 04/30/21 1319     Visit Number 6    Number of Visits 12    Date for PT Re-Evaluation 05/21/21    Authorization Type medicare primary, BCBS secondary no auth    Progress Note Due on Visit 10    PT Start Time 1319   late to sign in   PT Stop Time 1357    PT Time Calculation (min) 38 min    Activity Tolerance Patient tolerated treatment well    Behavior During Therapy [facility] for tasks assessed/performed             Past Medical History:  Diagnosis Date   Asthma    Diastolic dysfunction    GERD (gastroesophageal reflux disease)    History of hiatal hernia    Hypertension    Osteoarthritis of left knee     Past Surgical History:  Procedure Laterality Date   BREAST BIOPSY Left    years ago- no scar ? needle biopsy only   CHOLECYSTECTOMY  10/10   COLONOSCOPY     ESOPHAGOGASTRODUODENOSCOPY     EYE SURGERY     cataract right   KNEE ARTHROSCOPY WITH MEDIAL MENISECTOMY Left 06/16/2015   Procedure: KNEE ARTHROSCOPY WITH PARTIAL MEDIAL MENISECTOMY;  Surgeon: Carole Civil, MD;  Location: [facility];  Service: Orthopedics;  Laterality: Left;   PARTIAL KNEE ARTHROPLASTY Left 07/08/2017   Procedure: UNICOMPARTMENTAL KNEE;  Surgeon: Renette Butters, MD;  Location: [facility];  Service: Orthopedics;  Laterality: Left;    There were no vitals filed for this visit.   Subjective Assessment - 04/30/21 1326     Subjective States she had a rough weekend. States she has been decorating a lot and feels like it took a lot out of her. States that she stopped taking prednisone on Thursday and doesn't know if she had withdrawals as she was real off over  the weekend and slept a good bit. States she had to put her kitty to sleep today and is just very sad                [facility] PT Assessment - 04/30/21 0001       Assessment   Medical Diagnosis gait instability    Referring Provider (PT) Caren Macadam, MD               Resting vitals 138/67 and HR 77            [facility] Adult PT Treatment/Exercise - 04/30/21 0001       Knee/Hip Exercises: Stretches   Active Hamstring Stretch Both;3 reps;30 seconds    Active Hamstring Stretch Limitations seated    Piriformis Stretch Both;4 reps;20 seconds   difficult on right, seated     Knee/Hip Exercises: Standing   Other Standing Knee Exercises lateral stepping in mini squat withoutUE support x10 of 6 feet B      Knee/Hip Exercises: Seated   Long Arc Quad AROM;3 sets;5 reps;Both    Long Arc Quad Limitations on dyno disc    Other Seated Knee/Hip Exercises self massage with massage stick - 5 minutes to lower legs    Other Seated Knee/Hip Exercises seated  on dyno disc marching - alternating x2 1.5 minutes, balance holds static x2 1.5 minutes.                     PT Education - 04/30/21 1333     Education Details on differnet breathing techniques, on alternative exercises on vurrent vitals.    Person(s) Educated Patient    Methods Explanation    Comprehension Verbalized understanding              PT Short Term Goals - 04/16/21 1247       PT SHORT TERM GOAL #1   Title Patient will be independent in self management strategies to improve quality of life and functional outcomes.    Time 3    Period Weeks    Status On-going    Target Date 04/30/21      PT SHORT TERM GOAL #2   Title Patient will report at least 50% improvement in overall symptoms and/or function to demonstrate improved functional mobility    Time 3    Period Weeks    Status On-going    Target Date 04/30/21      PT SHORT TERM GOAL #3   Title Patient will report walking with cane  consistently when out of the house.    Time 3    Period Weeks    Status On-going    Target Date 04/30/21               PT Long Term Goals - 04/16/21 1247       PT LONG TERM GOAL #1   Title Patient will report at least 75% improvement in overall symptoms and/or function to demonstrate improved functional mobility    Time 6    Period Weeks    Status On-going      PT LONG TERM GOAL #2   Title Patient will be able to go up and down stairs with reciprocal gait and use of one railing if needed to improve ability to go up the stairs at home.    Time 6    Period Weeks    Status On-going      PT LONG TERM GOAL #3   Title Patient will report not feeling wobly when out shopping    Time 6    Period Weeks    Status On-going                   Plan - 04/30/21 1320     Clinical Impression Statement Session limited secondary to reduced energy and overall feeling of just having an off day with recent pet's death. Session focused on seated exercises with massage stick exercise tolerated very well. Piriformis stretch tolerated poorly secondary to knee pain but this reduced with self massage and patient able to tolerate the stretch afterwards. Cued patient to take smaller steps secondary to right knee catching with side steeping in mini squat.    Personal Factors and Comorbidities Comorbidity 1;Comorbidity 2    Comorbidities lumbar stenosis, chronic low back    Examination-Activity Limitations Stand;Stairs;Squat;Locomotion Level;Transfers    Examination-Participation Restrictions Community Activity;Meal Prep;Cleaning    Stability/Clinical Decision Making Stable/Uncomplicated    Rehab Potential Good    PT Frequency 2x / week    PT Duration 6 weeks    PT Treatment/Interventions ADLs/Self Care Home Management;Aquatic Therapy;Therapeutic exercise;Therapeutic activities;Patient/family education;Neuromuscular re-education;DME Instruction;Gait training;Stair training;Moist  Heat;Cryotherapy    PT Next Visit Plan continue to improve strength, balance and walking mechanics -  do not focus on pain (has already tried multiple health providers for her leg pain); flexion vs extenstion biased exercises?    PT Home Exercise Plan STS, SLS; 11/21 - ab set, tandem stance on back toe; lateral stepping in mini squat; LAQs    Consulted and Agree with Plan of Care Patient             Patient will benefit from skilled therapeutic intervention in order to improve the following deficits and impairments:  Abnormal gait, Pain, Decreased activity tolerance, Decreased balance, Decreased mobility, Difficulty walking, Postural dysfunction, Decreased endurance, Decreased strength, Decreased knowledge of use of DME  Visit Diagnosis: Difficulty in walking, not elsewhere classified  Muscle weakness (generalized)     Problem List Patient Active Problem List   Diagnosis Date Noted   Leg length discrepancy 07/12/2020   Spinal stenosis, lumbar region, with neurogenic claudication 04/03/2020   Nerve pain 04/03/2020   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

## 2021-05-02 ENCOUNTER — Ambulatory Visit (HOSPITAL_COMMUNITY): Payer: Medicare Other | Admitting: Physical Therapy

## 2021-05-02 ENCOUNTER — Other Ambulatory Visit: Payer: Self-pay

## 2021-05-02 ENCOUNTER — Encounter (HOSPITAL_COMMUNITY): Payer: Self-pay | Admitting: Physical Therapy

## 2021-05-02 DIAGNOSIS — M6281 Muscle weakness (generalized): Secondary | ICD-10-CM

## 2021-05-02 DIAGNOSIS — R262 Difficulty in walking, not elsewhere classified: Secondary | ICD-10-CM | POA: Diagnosis not present

## 2021-05-02 NOTE — Therapy (Signed)
[place], [place], [address] Phone: [phone]   Fax:  [phone]  Physical Therapy Treatment  Patient Details  Name: Laura Carter MRN: [id number] Date of Birth: 10/01/1940 Referring Provider (PT): Caren Macadam, MD   Encounter Date: 05/02/2021   PT End of Session - 05/02/21 1542     Visit Number 7    Number of Visits 12    Date for PT Re-Evaluation 05/21/21    Authorization Type medicare primary, BCBS secondary no auth    Progress Note Due on Visit 10    PT Start Time 1531    PT Stop Time 1609    PT Time Calculation (min) 38 min    Activity Tolerance Patient tolerated treatment well    Behavior During Therapy [facility] for tasks assessed/performed             Past Medical History:  Diagnosis Date   Asthma    Diastolic dysfunction    GERD (gastroesophageal reflux disease)    History of hiatal hernia    Hypertension    Osteoarthritis of left knee     Past Surgical History:  Procedure Laterality Date   BREAST BIOPSY Left    years ago- no scar ? needle biopsy only   CHOLECYSTECTOMY  10/10   COLONOSCOPY     ESOPHAGOGASTRODUODENOSCOPY     EYE SURGERY     cataract right   KNEE ARTHROSCOPY WITH MEDIAL MENISECTOMY Left 06/16/2015   Procedure: KNEE ARTHROSCOPY WITH PARTIAL MEDIAL MENISECTOMY;  Surgeon: Carole Civil, MD;  Location: [facility];  Service: Orthopedics;  Laterality: Left;   PARTIAL KNEE ARTHROPLASTY Left 07/08/2017   Procedure: UNICOMPARTMENTAL KNEE;  Surgeon: Renette Butters, MD;  Location: [facility];  Service: Orthopedics;  Laterality: Left;    There were no vitals filed for this visit.   Subjective Assessment - 05/02/21 1558     Subjective States her breathing is a little worse and she seems to be more breathless and doesn't know what that is about. Reports she does not have her inhaler with her as she used it this morning and took it out of her purse. States she drove to [place] for  the first time in a long time yesterday.                [facility] PT Assessment - 05/02/21 0001       Assessment   Medical Diagnosis gait instability    Referring Provider (PT) Caren Macadam, MD                           [facility] Adult PT Treatment/Exercise - 05/02/21 0001       Knee/Hip Exercises: Stretches   Active Hamstring Stretch Both;3 reps;30 seconds    Active Hamstring Stretch Limitations seated    Piriformis Stretch Both;4 reps;20 seconds      Knee/Hip Exercises: Standing   Other Standing Knee Exercises lateral stepping in mini squat withoutUE support x10 of 6 feet B; tandem x3 30" holds B near counter    Other Standing Knee Exercises marching with hip extension combo 4x5 B one hand support      Knee/Hip Exercises: Seated   Long Arc Quad AROM;3 sets;5 reps;Both    Sit to Sand without UE support;5 reps;3 sets   with yellow ball                    PT  Education - 05/02/21 1543     Education Details Educated patient on following up with MD secondary to continued shortness of breath and to always bring in rescue inhaler to appointments.    Person(s) Educated Patient    Methods Explanation    Comprehension Verbalized understanding              PT Short Term Goals - 04/16/21 1247       PT SHORT TERM GOAL #1   Title Patient will be independent in self management strategies to improve quality of life and functional outcomes.    Time 3    Period Weeks    Status On-going    Target Date 04/30/21      PT SHORT TERM GOAL #2   Title Patient will report at least 50% improvement in overall symptoms and/or function to demonstrate improved functional mobility    Time 3    Period Weeks    Status On-going    Target Date 04/30/21      PT SHORT TERM GOAL #3   Title Patient will report walking with cane consistently when out of the house.    Time 3    Period Weeks    Status On-going    Target Date 04/30/21               PT Long Term  Goals - 04/16/21 1247       PT LONG TERM GOAL #1   Title Patient will report at least 75% improvement in overall symptoms and/or function to demonstrate improved functional mobility    Time 6    Period Weeks    Status On-going      PT LONG TERM GOAL #2   Title Patient will be able to go up and down stairs with reciprocal gait and use of one railing if needed to improve ability to go up the stairs at home.    Time 6    Period Weeks    Status On-going      PT LONG TERM GOAL #3   Title Patient will report not feeling wobly when out shopping    Time 6    Period Weeks    Status On-going                   Plan - 05/02/21 1555     Clinical Impression Statement Instructed patient to follow up with MD about continued shortness of breath and symptoms and to always bring in inhaler to apts. Added standing exercises but allowed for longer rest breaks to catch breath. Superset standing exercises with seated exercises form more active rest break. General fatigue noted end of session but no pain or increase in shortness of breath upon leaving appointment.    Personal Factors and Comorbidities Comorbidity 1;Comorbidity 2    Comorbidities lumbar stenosis, chronic low back    Examination-Activity Limitations Stand;Stairs;Squat;Locomotion Level;Transfers    Examination-Participation Restrictions Community Activity;Meal Prep;Cleaning    Stability/Clinical Decision Making Stable/Uncomplicated    Rehab Potential Good    PT Frequency 2x / week    PT Duration 6 weeks    PT Treatment/Interventions ADLs/Self Care Home Management;Aquatic Therapy;Therapeutic exercise;Therapeutic activities;Patient/family education;Neuromuscular re-education;DME Instruction;Gait training;Stair training;Moist Heat;Cryotherapy    PT Next Visit Plan continue to improve strength, balance and walking mechanics - do not focus on pain (has already tried multiple health providers for her leg pain); flexion vs extenstion  biased exercises?    PT Home Exercise Plan STS, SLS; 11/21 -  ab set, tandem stance on back toe; lateral stepping in mini squat; LAQs; marching    Consulted and Agree with Plan of Care Patient             Patient will benefit from skilled therapeutic intervention in order to improve the following deficits and impairments:  Abnormal gait, Pain, Decreased activity tolerance, Decreased balance, Decreased mobility, Difficulty walking, Postural dysfunction, Decreased endurance, Decreased strength, Decreased knowledge of use of DME  Visit Diagnosis: Difficulty in walking, not elsewhere classified  Muscle weakness (generalized)     Problem List Patient Active Problem List   Diagnosis Date Noted   Leg length discrepancy 07/12/2020   Spinal stenosis, lumbar region, with neurogenic claudication 04/03/2020   Nerve pain 04/03/2020   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

## 2021-05-03 DIAGNOSIS — J019 Acute sinusitis, unspecified: Secondary | ICD-10-CM | POA: Diagnosis not present

## 2021-05-03 DIAGNOSIS — Z20822 Contact with and (suspected) exposure to covid-19: Secondary | ICD-10-CM | POA: Diagnosis not present

## 2021-05-03 DIAGNOSIS — J4521 Mild intermittent asthma with (acute) exacerbation: Secondary | ICD-10-CM | POA: Diagnosis not present

## 2021-05-07 ENCOUNTER — Ambulatory Visit (HOSPITAL_COMMUNITY): Payer: Medicare Other | Admitting: Physical Therapy

## 2021-05-07 ENCOUNTER — Other Ambulatory Visit: Payer: Self-pay

## 2021-05-07 ENCOUNTER — Encounter: Payer: Medicare Other | Admitting: Physical Medicine and Rehabilitation

## 2021-05-07 ENCOUNTER — Encounter (HOSPITAL_COMMUNITY): Payer: Self-pay | Admitting: Physical Therapy

## 2021-05-07 DIAGNOSIS — M6281 Muscle weakness (generalized): Secondary | ICD-10-CM

## 2021-05-07 DIAGNOSIS — R262 Difficulty in walking, not elsewhere classified: Secondary | ICD-10-CM | POA: Diagnosis not present

## 2021-05-07 NOTE — Therapy (Signed)
[place], [place], [address] Phone: [phone]   Fax:  [phone]  Physical Therapy Treatment  Patient Details  Name: Laura Carter MRN: [id number] Date of Birth: 1940-08-30 Referring Provider (PT): Caren Macadam, MD   Encounter Date: 05/07/2021   PT End of Session - 05/07/21 1540     Visit Number 8    Number of Visits 12    Date for PT Re-Evaluation 05/21/21    Authorization Type medicare primary, BCBS secondary no auth    Progress Note Due on Visit 10    PT Start Time 1535    PT Stop Time 6644    PT Time Calculation (min) 38 min    Activity Tolerance Patient tolerated treatment well    Behavior During Therapy [facility] for tasks assessed/performed             Past Medical History:  Diagnosis Date   Asthma    Diastolic dysfunction    GERD (gastroesophageal reflux disease)    History of hiatal hernia    Hypertension    Osteoarthritis of left knee     Past Surgical History:  Procedure Laterality Date   BREAST BIOPSY Left    years ago- no scar ? needle biopsy only   CHOLECYSTECTOMY  10/10   COLONOSCOPY     ESOPHAGOGASTRODUODENOSCOPY     EYE SURGERY     cataract right   KNEE ARTHROSCOPY WITH MEDIAL MENISECTOMY Left 06/16/2015   Procedure: KNEE ARTHROSCOPY WITH PARTIAL MEDIAL MENISECTOMY;  Surgeon: Carole Civil, MD;  Location: [facility];  Service: Orthopedics;  Laterality: Left;   PARTIAL KNEE ARTHROPLASTY Left 07/08/2017   Procedure: UNICOMPARTMENTAL KNEE;  Surgeon: Renette Butters, MD;  Location: [place];  Service: Orthopedics;  Laterality: Left;    There were no vitals filed for this visit.   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 States she has been very busy and hasn't been doing too many exercises. States that since being on the prednisone she has  been feeling better in her left leg too.    Currently in Pain? Yes    Pain Score 4     Pain Location Leg    Pain Orientation Left    Pain Descriptors / Indicators Aching                [facility] PT Assessment - 05/07/21 0001       Assessment   Medical Diagnosis gait instability    Referring Provider (PT) Caren Macadam, MD                           [facility] Adult PT Treatment/Exercise - 05/07/21 0001       Knee/Hip Exercises: Standing   Other Standing Knee Exercises fwd reaches with weighted yellow ball over table 4x5, tandem max effort PRN UE assist  x3 B    Other Standing Knee Exercises chair pose holds 2x5 5" holds no UE assist      Knee/Hip Exercises: Seated   Sit to Sand without UE support;5 reps;3 sets             SLS with UE assist B max attempts x5    walking with cervical ROT x5 laps of 25 feet  PT Short Term Goals - 04/16/21 1247       PT SHORT TERM GOAL #1   Title Patient will be independent in self management strategies to improve quality of life and functional outcomes.    Time 3    Period Weeks    Status On-going    Target Date 04/30/21      PT SHORT TERM GOAL #2   Title Patient will report at least 50% improvement in overall symptoms and/or function to demonstrate improved functional mobility    Time 3    Period Weeks    Status On-going    Target Date 04/30/21      PT SHORT TERM GOAL #3   Title Patient will report walking with cane consistently when out of the house.    Time 3    Period Weeks    Status On-going    Target Date 04/30/21               PT Long Term Goals - 04/16/21 1247       PT LONG TERM GOAL #1   Title Patient will report at least 75% improvement in overall symptoms and/or function to demonstrate improved functional mobility    Time 6    Period Weeks    Status On-going      PT LONG TERM GOAL #2   Title Patient will be able to go up and down stairs with reciprocal gait and use of one  railing if needed to improve ability to go up the stairs at home.    Time 6    Period Weeks    Status On-going      PT LONG TERM GOAL #3   Title Patient will report not feeling wobly when out shopping    Time 6    Period Weeks    Status On-going                   Plan - 05/07/21 1606     Clinical Impression Statement Patient demonstrated improved tandem and tolerance to exercises on this date. No shortness of breath noted during this session.  Fatigue with all exercises. No increase in pain noted during session. Will continue with current POC.    Personal Factors and Comorbidities Comorbidity 1;Comorbidity 2    Comorbidities lumbar stenosis, chronic low back    Examination-Activity Limitations Stand;Stairs;Squat;Locomotion Level;Transfers    Examination-Participation Restrictions Community Activity;Meal Prep;Cleaning    Rehab Potential Good    PT Frequency 2x / week    PT Duration 6 weeks    PT Treatment/Interventions ADLs/Self Care Home Management;Aquatic Therapy;Therapeutic exercise;Therapeutic activities;Patient/family education;Neuromuscular re-education;DME Instruction;Gait training;Stair training;Moist Heat;Cryotherapy    PT Next Visit Plan continue to improve strength, balance and walking mechanics - do not focus on pain (has already tried multiple health providers for her leg pain); flexion vs extenstion biased exercises?    PT Home Exercise Plan STS, SLS; 11/21 - ab set, tandem stance on back toe; lateral stepping in mini squat; LAQs; marching             Patient will benefit from skilled therapeutic intervention in order to improve the following deficits and impairments:  Abnormal gait, Pain, Decreased activity tolerance, Decreased balance, Decreased mobility, Difficulty walking, Postural dysfunction, Decreased endurance, Decreased strength, Decreased knowledge of use of DME  Visit Diagnosis: Difficulty in walking, not elsewhere classified  Muscle weakness  (generalized)     Problem List Patient Active Problem List   Diagnosis Date Noted  Leg length discrepancy 07/12/2020   Spinal stenosis, lumbar region, with neurogenic claudication 04/03/2020   Nerve pain 04/03/2020   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

## 2021-05-10 ENCOUNTER — Ambulatory Visit (HOSPITAL_COMMUNITY): Payer: Medicare Other

## 2021-05-14 ENCOUNTER — Encounter (HOSPITAL_COMMUNITY): Payer: Medicare Other

## 2021-05-16 ENCOUNTER — Encounter (HOSPITAL_COMMUNITY): Payer: Medicare Other | Admitting: Physical Therapy

## 2021-05-22 ENCOUNTER — Encounter (HOSPITAL_COMMUNITY): Payer: Self-pay | Admitting: Physical Therapy

## 2021-05-22 ENCOUNTER — Other Ambulatory Visit: Payer: Self-pay

## 2021-05-22 ENCOUNTER — Ambulatory Visit (HOSPITAL_COMMUNITY): Payer: Medicare Other | Admitting: Physical Therapy

## 2021-05-22 DIAGNOSIS — M6281 Muscle weakness (generalized): Secondary | ICD-10-CM | POA: Diagnosis not present

## 2021-05-22 DIAGNOSIS — R262 Difficulty in walking, not elsewhere classified: Secondary | ICD-10-CM | POA: Diagnosis not present

## 2021-05-22 NOTE — Therapy (Signed)
[place], [place], [address] Phone: [phone]   Fax:  [phone]  Physical Therapy Treatment/Discharge Summary  Patient Details  Name: Laura Carter MRN: [id number] Date of Birth: 07-26-1940 Referring Provider (PT): Caren Macadam, MD   Encounter Date: 05/22/2021  PHYSICAL THERAPY DISCHARGE SUMMARY  Visits from Start of Care: 9  Current functional level related to goals / functional outcomes: See below   Remaining deficits: See below   Education / Equipment: See below   Patient agrees to discharge. Patient goals were met. Patient is being discharged due to meeting rehab goals.    PT End of Session - 05/22/21 1456     Visit Number 9    Number of Visits 12    Date for PT Re-Evaluation 05/21/21    Authorization Type medicare primary, BCBS secondary no auth    Progress Note Due on Visit 10    PT Start Time 1454    PT Stop Time 1519    PT Time Calculation (min) 25 min    Activity Tolerance Patient tolerated treatment well    Behavior During Therapy WFL for tasks assessed/performed             Past Medical History:  Diagnosis Date   Asthma    Diastolic dysfunction    GERD (gastroesophageal reflux disease)    History of hiatal hernia    Hypertension    Osteoarthritis of left knee     Past Surgical History:  Procedure Laterality Date   BREAST BIOPSY Left    years ago- no scar ? needle biopsy only   CHOLECYSTECTOMY  10/10   COLONOSCOPY     ESOPHAGOGASTRODUODENOSCOPY     EYE SURGERY     cataract right   KNEE ARTHROSCOPY WITH MEDIAL MENISECTOMY Left 06/16/2015   Procedure: KNEE ARTHROSCOPY WITH PARTIAL MEDIAL MENISECTOMY;  Surgeon: Carole Civil, MD;  Location: [facility];  Service: Orthopedics;  Laterality: Left;   PARTIAL KNEE ARTHROPLASTY Left 07/08/2017   Procedure: UNICOMPARTMENTAL KNEE;  Surgeon: Renette Butters, MD;  Location: [place];  Service: Orthopedics;  Laterality: Left;     There were no vitals filed for this visit.   Subjective Assessment - 05/22/21 1457     Subjective Patient state she has not been doing many exercises because she was sick. Patient states 50% improvement with physical therapy intervention. She knows she has not been doing what she needs to do because she has been busy with Christmas. She has not been using cane more, its in the car. Her balance is better and she is not wobbly like she was. Stairs have been getting better.    Pertinent History lumbar stenosis, B LE pain    Patient Stated Goals to walk better                [facility] PT Assessment - 05/22/21 0001       Assessment   Medical Diagnosis gait instability    Referring Provider (PT) Caren Macadam, MD      Restrictions   Weight Bearing Restrictions No      Balance Screen   Has the patient fallen in the past 6 months No    Has the patient had a decrease in activity level because of a fear of falling?  No    Is the patient reluctant to leave their home because of a fear of falling?  No      Cognition   Overall Cognitive  Status Within Functional Limits for tasks assessed      Observation/Other Assessments   Observations Ambulates without AD    Focus on Therapeutic Outcomes (FOTO)  NA      Strength   Right Hip Flexion 5/5    Left Hip Flexion 5/5    Right Knee Flexion 5/5    Right Knee Extension 5/5    Left Knee Flexion 5/5    Left Knee Extension 5/5    Right Ankle Dorsiflexion 5/5    Left Ankle Dorsiflexion 5/5      Ambulation/Gait   Ambulation/Gait Yes    Ambulation/Gait Assistance 6: Modified independent (Device/Increase time)    Ambulation Distance (Feet) 340 Feet    Assistive device None    Gait Pattern Trunk flexed;Antalgic    Ambulation Surface Level;Indoor    Gait velocity reduced    Stairs Yes    Stairs Assistance 6: Modified independent (Device/Increase time)    Stair Management Technique Alternating pattern   unilateral UE support   Number of  Stairs 8    Height of Stairs 7    Gait Comments 2MWT                                    PT Education - 05/22/21 1457     Education Details HEP, reassessment findings, returning to PT if needed    Person(s) Educated Patient    Methods Explanation;Demonstration    Comprehension Verbalized understanding;Returned demonstration              PT Short Term Goals - 05/22/21 1501       PT SHORT TERM GOAL #1   Title Patient will be independent in self management strategies to improve quality of life and functional outcomes.    Time 3    Period Weeks    Status Achieved    Target Date 04/30/21      PT SHORT TERM GOAL #2   Title Patient will report at least 50% improvement in overall symptoms and/or function to demonstrate improved functional mobility    Time 3    Period Weeks    Status Achieved    Target Date 04/30/21      PT SHORT TERM GOAL #3   Title Patient will report walking with cane consistently when out of the house.    Time 3    Period Weeks    Status Not Met    Target Date 04/30/21               PT Long Term Goals - 05/22/21 1513       PT LONG TERM GOAL #1   Title Patient will report at least 75% improvement in overall symptoms and/or function to demonstrate improved functional mobility    Time 6    Period Weeks    Status Not Met      PT LONG TERM GOAL #2   Title Patient will be able to go up and down stairs with reciprocal gait and use of one railing if needed to improve ability to go up the stairs at home.    Time 6    Period Weeks    Status Achieved      PT LONG TERM GOAL #3   Title Patient will report not feeling wobly when out shopping    Time 6    Period Weeks    Status Achieved  Plan - 05/22/21 1456     Clinical Impression Statement Patient has met 2/3 short term goals and 2/3 long term goals with ability to complete HEP and improvement in symptoms, strength, gait, balance and  functional mobility. Patient limited at this time with decreased performance of HEP secondary to sickness and holidays. Patient eager to resume HEP and make further improvements. Patient educated on progress made and return to PT if needed. Patient discharged from physical therapy at this time.    Personal Factors and Comorbidities Comorbidity 1;Comorbidity 2    Comorbidities lumbar stenosis, chronic low back    Examination-Activity Limitations Stand;Stairs;Squat;Locomotion Level;Transfers    Examination-Participation Restrictions Community Activity;Meal Prep;Cleaning    Rehab Potential Good    PT Frequency --    PT Duration --    PT Treatment/Interventions ADLs/Self Care Home Management;Aquatic Therapy;Therapeutic exercise;Therapeutic activities;Patient/family education;Neuromuscular re-education;DME Instruction;Gait training;Stair training;Moist Heat;Cryotherapy    PT Next Visit Plan n/a    PT Home Exercise Plan STS, SLS; 11/21 - ab set, tandem stance on back toe; lateral stepping in mini squat; LAQs; marching             Patient will benefit from skilled therapeutic intervention in order to improve the following deficits and impairments:  Abnormal gait, Pain, Decreased activity tolerance, Decreased balance, Decreased mobility, Difficulty walking, Postural dysfunction, Decreased endurance, Decreased strength, Decreased knowledge of use of DME  Visit Diagnosis: Difficulty in walking, not elsewhere classified  Muscle weakness (generalized)     Problem List Patient Active Problem List   Diagnosis Date Noted   Leg length discrepancy 07/12/2020   Spinal stenosis, lumbar region, with neurogenic claudication 04/03/2020   Nerve pain 04/03/2020   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

## 2021-05-23 ENCOUNTER — Ambulatory Visit
Admission: RE | Admit: 2021-05-23 | Discharge: 2021-05-23 | Disposition: A | Discharge status: Home / Self Care | Payer: Medicare Other | Source: Ambulatory Visit | Attending: Urgent Care | Admitting: Urgent Care

## 2021-05-23 ENCOUNTER — Ambulatory Visit (INDEPENDENT_AMBULATORY_CARE_PROVIDER_SITE_OTHER): Payer: Medicare Other

## 2021-05-23 VITALS — BP 158/90 | HR 75 | Temp 98.1°F | Resp 18

## 2021-05-23 DIAGNOSIS — J4530 Mild persistent asthma, uncomplicated: Secondary | ICD-10-CM

## 2021-05-23 DIAGNOSIS — R9389 Abnormal findings on diagnostic imaging of other specified body structures: Secondary | ICD-10-CM | POA: Diagnosis not present

## 2021-05-23 DIAGNOSIS — R053 Chronic cough: Secondary | ICD-10-CM

## 2021-05-23 DIAGNOSIS — R059 Cough, unspecified: Secondary | ICD-10-CM

## 2021-05-23 MED ORDER — BENZONATATE 100 MG PO CAPS: 100.0000 mg | ORAL_CAPSULE | Freq: Three times a day (TID) | ORAL | 0 refills | Status: DC | PRN

## 2021-05-23 MED ORDER — PROMETHAZINE-DM 6.25-15 MG/5ML PO SYRP: 5.0000 mL | ORAL_SOLUTION | Freq: Every evening | ORAL | 0 refills | Status: DC | PRN

## 2021-05-23 MED ORDER — ALBUTEROL SULFATE HFA 108 (90 BASE) MCG/ACT IN AERS: 1.0000 | INHALATION_SPRAY | Freq: Four times a day (QID) | RESPIRATORY_TRACT | 0 refills | Status: DC | PRN

## 2021-05-23 NOTE — ED Provider Notes (Signed)
[place]   MRN: [id number] DOB: 01-Mar-1941  Subjective:   Laura Carter is a 80 y.o. female presenting for 1 month history of persistent productive cough, hacking cough.  Patient finished a course of antibiotics and steroids about 2 weeks ago for bronchitis.  She does have a history of asthma but no CHF.  No smoking history.  No current facility-administered medications for this encounter.  Current Outpatient Medications:    aspirin EC 81 MG tablet, Take 1 tablet (81 mg total) by mouth daily., Disp: , Rfl:    Cholecalciferol (VITAMIN D3 PO), Take 1 tablet by mouth daily. 2000 units, Disp: , Rfl:    Cyanocobalamin (VITAMIN B-12 PO), Take 1 tablet by mouth daily., Disp: , Rfl:    DULoxetine (CYMBALTA) 20 MG capsule, , Disp: , Rfl:    esomeprazole (NEXIUM) 20 MG capsule, Take 20 mg by mouth daily at 12 noon., Disp: , Rfl:    glucosamine-chondroitin 500-400 MG tablet, Take 1 tablet by mouth 3 (three) times daily., Disp: , Rfl:    KRILL OIL PO, Take 1 capsule by mouth daily. (Patient not taking: Reported on 02/05/2021), Disp: , Rfl:    Magnesium 500 MG CAPS, Take 400 mg by mouth at bedtime. (Patient not taking: Reported on 02/05/2021), Disp: , Rfl:    metoprolol succinate (TOPROL-XL) 25 MG 24 hr tablet, TAKE (1) TABLET BY MOUTH ONCE DAILY., Disp: 90 tablet, Rfl: 3   Multiple Vitamins-Minerals (ZINC PO), Take 1 tablet by mouth daily as needed (for immune health/onset of cold)., Disp: , Rfl:    pregabalin (LYRICA) 25 MG capsule, Take 1 capsule (25 mg total) by mouth 2 (two) times daily. - for nerve pain (Patient not taking: Reported on 02/05/2021), Disp: 120 capsule, Rfl: 1   Turmeric 500 MG CAPS, Take by mouth., Disp: , Rfl:    No Known Allergies  Past Medical History:  Diagnosis Date   Asthma    Diastolic dysfunction    GERD (gastroesophageal reflux disease)    History of hiatal hernia    Hypertension    Osteoarthritis of left knee      Past Surgical History:   Procedure Laterality Date   BREAST BIOPSY Left    years ago- no scar ? needle biopsy only   CHOLECYSTECTOMY  10/10   COLONOSCOPY     ESOPHAGOGASTRODUODENOSCOPY     EYE SURGERY     cataract right   KNEE ARTHROSCOPY WITH MEDIAL MENISECTOMY Left 06/16/2015   Procedure: KNEE ARTHROSCOPY WITH PARTIAL MEDIAL MENISECTOMY;  Surgeon: Carole Civil, MD;  Location: [facility];  Service: Orthopedics;  Laterality: Left;   PARTIAL KNEE ARTHROPLASTY Left 07/08/2017   Procedure: UNICOMPARTMENTAL KNEE;  Surgeon: Renette Butters, MD;  Location: [place];  Service: Orthopedics;  Laterality: Left;    Family History  Problem Relation Age of Onset   Cancer Mother        breast   Stomach cancer Mother    Breast cancer Mother    Heart disease Father    Heart disease Sister    Breast cancer Sister     Social History   Tobacco Use   Smoking status: Former    Packs/day: 1.00    Years: 10.00    Pack years: 10.00    Types: Cigarettes    Quit date: 06/13/1965    Years since quitting: 55.9   Smokeless tobacco: Never  Vaping Use   Vaping Use: Never used  Substance Use Topics   Alcohol use:  No   Drug use: No    ROS   Objective:   Vitals: BP (!) 158/90 (BP Location: Left Arm)    Pulse 75    Temp 98.1 F (36.7 C) (Oral)    Resp 18    SpO2 96%   Physical Exam Constitutional:      General: She is not in acute distress.    Appearance: Normal appearance. She is well-developed. She is not ill-appearing, toxic-appearing or diaphoretic.  HENT:     Head: Normocephalic and atraumatic.     Nose: Nose normal.     Mouth/Throat:     Mouth: Mucous membranes are moist.  Eyes:     Extraocular Movements: Extraocular movements intact.     Pupils: Pupils are equal, round, and reactive to light.  Cardiovascular:     Rate and Rhythm: Normal rate and regular rhythm.     Pulses: Normal pulses.     Heart sounds: Normal heart sounds. No murmur heard.   No friction rub. No gallop.  Pulmonary:     Effort:  Pulmonary effort is normal. No respiratory distress.     Breath sounds: Normal breath sounds. No stridor. No wheezing, rhonchi or rales.  Skin:    General: Skin is warm and dry.     Findings: No rash.  Neurological:     Mental Status: She is alert and oriented to person, place, and time.  Psychiatric:        Mood and Affect: Mood normal.        Behavior: Behavior normal.        Thought Content: Thought content normal.    DG Chest 2 View  Result Date: 05/23/2021 CLINICAL DATA:  Persistent cough EXAM: CHEST - 2 VIEW COMPARISON:  03/01/2020 FINDINGS: The heart size and mediastinal contours are within normal limits. Possible nodular opacity in the mid left lung. No consolidation or edema. No pleural effusion. IMPRESSION: Possible nodular opacity in the mid left lung. Chest CT recommended for further evaluation. Electronically Signed   By: Macy Mis M.D.   On: 05/23/2021 14:52     Assessment and Plan :   PDMP not reviewed this encounter.  1. Abnormal chest x-ray   2. Persistent cough   3. Mild persistent asthma without complication    Will pursue a chest CT scan for better evaluation of the abnormal chest x-ray. Recommended pursuing this as an outpatient with her PCP. Counseled patient on potential for adverse effects with medications prescribed/recommended today, ER and return-to-clinic precautions discussed, patient verbalized understanding.    Jaynee Eagles, PA-C 05/23/21 1505

## 2021-05-23 NOTE — ED Triage Notes (Signed)
Pt reports cough and congestion since 04/19/2021. States she was being treat it with antibiotics and prednisone for bronchitis 2 weeks ago, treatment was giving relief.

## 2021-05-23 NOTE — Discharge Instructions (Signed)
There is a spot in the middle of the left lung that looks abnormal.  The recommendation is to pursue a chest CT scan through your regular doctor.  I do not think that you need to go through the emergency room right now but as soon as you can please make an appointment with your regular doctor so that you can get the chest CT scan as an outpatient.  In the meantime, I will be prescribing you some cough medications to help you with your symptoms.

## 2021-05-24 ENCOUNTER — Encounter (HOSPITAL_COMMUNITY): Payer: Medicare Other | Admitting: Physical Therapy

## 2021-05-29 ENCOUNTER — Other Ambulatory Visit: Payer: Self-pay | Admitting: Family Medicine

## 2021-05-29 DIAGNOSIS — R911 Solitary pulmonary nodule: Secondary | ICD-10-CM

## 2021-06-06 ENCOUNTER — Ambulatory Visit
Admission: RE | Admit: 2021-06-06 | Discharge: 2021-06-06 | Disposition: A | Discharge status: Home / Self Care | Payer: Medicare Other | Source: Ambulatory Visit | Attending: Family Medicine | Admitting: Family Medicine

## 2021-06-06 ENCOUNTER — Inpatient Hospital Stay: Admission: RE | Admit: 2021-06-06 | Payer: Medicare Other | Source: Ambulatory Visit

## 2021-06-06 DIAGNOSIS — I10 Essential (primary) hypertension: Secondary | ICD-10-CM | POA: Diagnosis not present

## 2021-06-06 DIAGNOSIS — J4521 Mild intermittent asthma with (acute) exacerbation: Secondary | ICD-10-CM | POA: Diagnosis not present

## 2021-06-06 DIAGNOSIS — R2681 Unsteadiness on feet: Secondary | ICD-10-CM | POA: Diagnosis not present

## 2021-06-06 DIAGNOSIS — R911 Solitary pulmonary nodule: Secondary | ICD-10-CM

## 2021-06-06 DIAGNOSIS — J069 Acute upper respiratory infection, unspecified: Secondary | ICD-10-CM | POA: Diagnosis not present

## 2021-06-06 DIAGNOSIS — R9389 Abnormal findings on diagnostic imaging of other specified body structures: Secondary | ICD-10-CM | POA: Diagnosis not present

## 2021-06-06 MED ORDER — IOPAMIDOL (ISOVUE-300) INJECTION 61%
75.0000 mL | Freq: Once | INTRAVENOUS | Status: AC | PRN
Start: 2021-06-06 — End: 2021-06-06
  Administered 2021-06-06: 75 mL via INTRAVENOUS

## 2021-06-26 DIAGNOSIS — H5213 Myopia, bilateral: Secondary | ICD-10-CM | POA: Diagnosis not present

## 2021-06-26 DIAGNOSIS — H35373 Puckering of macula, bilateral: Secondary | ICD-10-CM | POA: Diagnosis not present

## 2021-06-26 DIAGNOSIS — H26492 Other secondary cataract, left eye: Secondary | ICD-10-CM | POA: Diagnosis not present

## 2021-07-30 ENCOUNTER — Other Ambulatory Visit: Payer: Self-pay

## 2021-07-30 ENCOUNTER — Encounter
Payer: Medicare Other | Attending: Physical Medicine and Rehabilitation | Admitting: Physical Medicine and Rehabilitation

## 2021-07-30 ENCOUNTER — Encounter: Payer: Self-pay | Admitting: Physical Medicine and Rehabilitation

## 2021-07-30 VITALS — BP 82/58 | HR 79 | Ht 65.0 in | Wt 186.0 lb

## 2021-07-30 DIAGNOSIS — M48062 Spinal stenosis, lumbar region with neurogenic claudication: Secondary | ICD-10-CM | POA: Diagnosis not present

## 2021-07-30 DIAGNOSIS — M792 Neuralgia and neuritis, unspecified: Secondary | ICD-10-CM | POA: Diagnosis not present

## 2021-07-30 DIAGNOSIS — M5116 Intervertebral disc disorders with radiculopathy, lumbar region: Secondary | ICD-10-CM | POA: Insufficient documentation

## 2021-07-30 MED ORDER — PREGABALIN 25 MG PO CAPS: 25.0000 mg | ORAL_CAPSULE | Freq: Two times a day (BID) | ORAL | 1 refills | Status: DC

## 2021-07-30 NOTE — Progress Notes (Signed)
? ?Subjective:  ? ? Patient ID: Laura Carter, female    DOB: 1941/04/13, 81 y.o.   MRN: [id number] ? ?HPI ? ?Patient is an 81 yr old female with hx of asthma, HTN, GERD, here for chronic B/L thigh/buttock pain. Marland Kitchen ?Severe spinal stenosis since 2018- with associated nerve pain.  ? Here for f/u on chronic back pain. ? ?Pain up to 7/10-  ?Pain daily- minimum 3/10 and up to 7/10.  ? ?B/L buttock pain down into L hamstring > R hamstring.  ? ?Goes sometimes to [facility]- for myofascial release.  ?Used to see 2x/month.  ?Now hasn't seen in the last month- 07/04/21.  ? ?Thinking about going back to accupuncture. Stopped Restaurant manager, fast food.  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  ?For resp issues- just finished 2 courses of PO ABX and prednisone.  ?When on prednisone, pain was better.  ? ?Pain worse as day goes on.  ? ? ?Had a big asthma attack Thursday night- and takes homeopathic meds for that. Has albuterol inhaler. Also has advair but didn't know how ot use.  ? ?Pain Inventory ?Average Pain 7 ?Pain Right Now 2 ?My pain is constant and sharp ? ?In the last 24 hours, has pain interfered with the following? ?General activity 0 ?Relation with others 0 ?Enjoyment of life 0 ?What TIME of day is your pain at its worst? evening and night ?Sleep (in general) Good ? ?Pain is worse with: walking, bending, and standing ?Pain improves with: rest and medication ?Relief from Meds: 7 ? ?Family History  ?Problem Relation Age of Onset  ? Cancer Mother   ?     breast  ? Stomach cancer Mother   ? Breast cancer Mother   ? Heart disease Father   ? Heart disease Sister   ? Breast cancer Sister   ? ?Social History  ? ?Socioeconomic History  ? Marital status: Married  ?  Spouse name: Not on file  ? Number of children: 2  ? Years of education: Not on file  ? Highest education level: 10th grade  ?Occupational History  ? Not on file  ?Tobacco Use  ? Smoking status: Former  ?  Packs/day: 1.00  ?  Years: 10.00  ?  Pack years: 10.00  ?  Types: Cigarettes  ?  Quit date:  06/13/1965  ?  Years since quitting: 56.1  ? Smokeless tobacco: Never  ?Vaping Use  ? Vaping Use: Never used  ?Substance and Sexual Activity  ? Alcohol use: No  ? Drug use: No  ? Sexual activity: Never  ?  Birth control/protection: None  ?Other Topics Concern  ? Not on file  ?Social History Narrative  ? Grew up in [place], [place]. Married. Has one daughter and one son. Has two grandchildren.   ? Did not work outside the home.  ? Eats all food groups.   ? Wear seatbelt.   ? Went to [employer] and graduated from [employer] in [place].   ? ?Social Determinants of Health  ? ?Financial Resource Strain: Not on file  ?Food Insecurity: Not on file  ?Transportation Needs: Not on file  ?Physical Activity: Not on file  ?Stress: Not on file  ?Social Connections: Not on file  ? ?Past Surgical History:  ?Procedure Laterality Date  ? BREAST BIOPSY Left   ? years ago- no scar ? needle biopsy only  ? CHOLECYSTECTOMY  10/10  ? COLONOSCOPY    ? ESOPHAGOGASTRODUODENOSCOPY    ? EYE SURGERY    ? cataract  right  ? KNEE ARTHROSCOPY WITH MEDIAL MENISECTOMY Left 06/16/2015  ? Procedure: KNEE ARTHROSCOPY WITH PARTIAL MEDIAL MENISECTOMY;  Surgeon: Carole Civil, MD;  Location: [facility];  Service: Orthopedics;  Laterality: Left;  ? PARTIAL KNEE ARTHROPLASTY Left 07/08/2017  ? Procedure: UNICOMPARTMENTAL KNEE;  Surgeon: Renette Butters, MD;  Location: [place];  Service: Orthopedics;  Laterality: Left;  ? ?Past Surgical History:  ?Procedure Laterality Date  ? BREAST BIOPSY Left   ? years ago- no scar ? needle biopsy only  ? CHOLECYSTECTOMY  10/10  ? COLONOSCOPY    ? ESOPHAGOGASTRODUODENOSCOPY    ? EYE SURGERY    ? cataract right  ? KNEE ARTHROSCOPY WITH MEDIAL MENISECTOMY Left 06/16/2015  ? Procedure: KNEE ARTHROSCOPY WITH PARTIAL MEDIAL MENISECTOMY;  Surgeon: Carole Civil, MD;  Location: [facility];  Service: Orthopedics;  Laterality: Left;  ? PARTIAL KNEE ARTHROPLASTY Left 07/08/2017  ? Procedure: UNICOMPARTMENTAL KNEE;  Surgeon:  Renette Butters, MD;  Location: [place];  Service: Orthopedics;  Laterality: Left;  ? ?Past Medical History:  ?Diagnosis Date  ? Asthma   ? Diastolic dysfunction   ? GERD (gastroesophageal reflux disease)   ? History of hiatal hernia   ? Hypertension   ? Osteoarthritis of left knee   ? ?BP (!) 82/58   Pulse 79   Ht '5\' 5"'$  (1.651 m)   Wt 186 lb (84.4 kg)   SpO2 93%   BMI 30.95 kg/m?  ? ?Opioid Risk Score:   ?Fall Risk Score:  `1 ? ?Depression screen PHQ 2/9 ? ?Depression screen [facility] 2/9 02/05/2021 11/08/2020 07/12/2020 05/10/2020 04/03/2020 12/04/2017 11/18/2017  ?Decreased Interest 0 0 0 0 0 0 0  ?Down, Depressed, Hopeless 0 0 0 - 0 0 0  ?PHQ - 2 Score 0 0 0 0 0 0 0  ?Altered sleeping - - - - 0 - -  ?Tired, decreased energy - - - - 0 - -  ?Change in appetite - - - - 0 - -  ?Feeling bad or failure about yourself  - - - - 0 - -  ?Trouble concentrating - - - - 0 - -  ?Moving slowly or fidgety/restless - - - - 0 - -  ?Suicidal thoughts - - - - 0 - -  ?PHQ-9 Score - - - - 0 - -  ?Difficult doing work/chores - - - - Not difficult at all - -  ?  ? ?Review of Systems  ?Musculoskeletal:  Positive for back pain.  ?     Pain in the back of legs from buttocks to feet ?Right knee pain  ?Neurological:  Positive for weakness.  ?All other systems reviewed and are negative. ? ?   ?Objective:  ? Physical Exam ? ?Awake, alert, appropriate, no assistive device, NAD ?Trigger points in L hamstring- medial and lateral- upper and lower-  ? ? ?   ?Assessment & Plan:  ? ?Patient is an 81 yr old female with hx of asthma, HTN, GERD, here for chronic B/L thigh/buttock pain. Marland Kitchen ?Severe spinal stenosis since 2018- with associated nerve pain.  ? Here for f/u on chronic back pain. ? ?Restart Lyrica 25 mg BID- #180- 3 months supply- with 1 refill.  ? ?2. Patient here for trigger point injections for ? Consent done and on chart. ? ?Cleaned areas with alcohol and injected using a 27 gauge 1.5 inch needle ? ?Injected  ?Using 1% Lidocaine with no  EPI ? ?Upper traps ?Levators ?Posterior scalenes ?Middle scalenes ?  Splenius Capitus ?Pectoralis Major ?Rhomboids ?Infraspinatus ?Teres Major/minor ?Thoracic paraspinals ?Lumbar paraspinals ?Other injections- L hamstring- medial and lateral- upper and lower ? ? ?Patient's level of pain prior was 2/10 ?Current level of pain after injections is- 2/10- usually worse in afternoon ? ?There was no bleeding or complications. ? ?Patient was advised to drink a lot of water on day after injections to flush system ?Will have increased soreness for 12-48 hours after injections.  ?Can use Lidocaine patches the day AFTER injections ?Can use theracane on day of injections in places didn't inject ?Can use heating pad 4-6 hours AFTER injections ? ?3. Demonstrated how to use Advair inhaler- SUCK the powder in! ? ? ?4. F/U in 3 months ? ? ? ? ?

## 2021-07-30 NOTE — Patient Instructions (Signed)
Patient is an 81 yr old female with hx of asthma, HTN, GERD, here for chronic B/L thigh/buttock pain. Marland Kitchen ?Severe spinal stenosis since 2018- with associated nerve pain.  ? Here for f/u on chronic back pain. ? ?Restart Lyrica 25 mg BID- #180- 3 months supply- with 1 refill.  ? ?2. Patient here for trigger point injections for ? Consent done and on chart. ? ?Cleaned areas with alcohol and injected using a 27 gauge 1.5 inch needle ? ?Injected  ?Using 1% Lidocaine with no EPI ? ?Upper traps ?Levators ?Posterior scalenes ?Middle scalenes ?Splenius Capitus ?Pectoralis Major ?Rhomboids ?Infraspinatus ?Teres Major/minor ?Thoracic paraspinals ?Lumbar paraspinals ?Other injections- L hamstring- medial and lateral- upper and lower ? ? ?Patient's level of pain prior was 2/10 ?Current level of pain after injections is- 2/10- usually worse in afternoon ? ?There was no bleeding or complications. ? ?Patient was advised to drink a lot of water on day after injections to flush system ?Will have increased soreness for 12-48 hours after injections.  ?Can use Lidocaine patches the day AFTER injections ?Can use theracane on day of injections in places didn't inject ?Can use heating pad 4-6 hours AFTER injections ? ?3. Demonstrated how to use Advair inhaler- SUCK the powder in! ? ? ?4. F/U in 3 months ?

## 2021-08-10 DIAGNOSIS — M9905 Segmental and somatic dysfunction of pelvic region: Secondary | ICD-10-CM | POA: Diagnosis not present

## 2021-08-10 DIAGNOSIS — M9903 Segmental and somatic dysfunction of lumbar region: Secondary | ICD-10-CM | POA: Diagnosis not present

## 2021-08-10 DIAGNOSIS — M9902 Segmental and somatic dysfunction of thoracic region: Secondary | ICD-10-CM | POA: Diagnosis not present

## 2021-08-10 DIAGNOSIS — M5442 Lumbago with sciatica, left side: Secondary | ICD-10-CM | POA: Diagnosis not present

## 2021-08-11 DIAGNOSIS — Z20822 Contact with and (suspected) exposure to covid-19: Secondary | ICD-10-CM | POA: Diagnosis not present

## 2021-08-14 DIAGNOSIS — Z20822 Contact with and (suspected) exposure to covid-19: Secondary | ICD-10-CM | POA: Diagnosis not present

## 2021-08-15 DIAGNOSIS — M9902 Segmental and somatic dysfunction of thoracic region: Secondary | ICD-10-CM | POA: Diagnosis not present

## 2021-08-15 DIAGNOSIS — M5442 Lumbago with sciatica, left side: Secondary | ICD-10-CM | POA: Diagnosis not present

## 2021-08-15 DIAGNOSIS — M9903 Segmental and somatic dysfunction of lumbar region: Secondary | ICD-10-CM | POA: Diagnosis not present

## 2021-08-15 DIAGNOSIS — M9905 Segmental and somatic dysfunction of pelvic region: Secondary | ICD-10-CM | POA: Diagnosis not present

## 2021-08-17 DIAGNOSIS — M9905 Segmental and somatic dysfunction of pelvic region: Secondary | ICD-10-CM | POA: Diagnosis not present

## 2021-08-17 DIAGNOSIS — M9903 Segmental and somatic dysfunction of lumbar region: Secondary | ICD-10-CM | POA: Diagnosis not present

## 2021-08-17 DIAGNOSIS — M5442 Lumbago with sciatica, left side: Secondary | ICD-10-CM | POA: Diagnosis not present

## 2021-08-17 DIAGNOSIS — M9902 Segmental and somatic dysfunction of thoracic region: Secondary | ICD-10-CM | POA: Diagnosis not present

## 2021-08-20 DIAGNOSIS — M9903 Segmental and somatic dysfunction of lumbar region: Secondary | ICD-10-CM | POA: Diagnosis not present

## 2021-08-20 DIAGNOSIS — M9905 Segmental and somatic dysfunction of pelvic region: Secondary | ICD-10-CM | POA: Diagnosis not present

## 2021-08-20 DIAGNOSIS — M9902 Segmental and somatic dysfunction of thoracic region: Secondary | ICD-10-CM | POA: Diagnosis not present

## 2021-08-20 DIAGNOSIS — M5442 Lumbago with sciatica, left side: Secondary | ICD-10-CM | POA: Diagnosis not present

## 2021-08-24 DIAGNOSIS — M9903 Segmental and somatic dysfunction of lumbar region: Secondary | ICD-10-CM | POA: Diagnosis not present

## 2021-08-24 DIAGNOSIS — M5442 Lumbago with sciatica, left side: Secondary | ICD-10-CM | POA: Diagnosis not present

## 2021-08-24 DIAGNOSIS — M9905 Segmental and somatic dysfunction of pelvic region: Secondary | ICD-10-CM | POA: Diagnosis not present

## 2021-08-24 DIAGNOSIS — M9902 Segmental and somatic dysfunction of thoracic region: Secondary | ICD-10-CM | POA: Diagnosis not present

## 2021-08-27 DIAGNOSIS — M9903 Segmental and somatic dysfunction of lumbar region: Secondary | ICD-10-CM | POA: Diagnosis not present

## 2021-08-27 DIAGNOSIS — M5442 Lumbago with sciatica, left side: Secondary | ICD-10-CM | POA: Diagnosis not present

## 2021-08-27 DIAGNOSIS — M9905 Segmental and somatic dysfunction of pelvic region: Secondary | ICD-10-CM | POA: Diagnosis not present

## 2021-08-27 DIAGNOSIS — M9902 Segmental and somatic dysfunction of thoracic region: Secondary | ICD-10-CM | POA: Diagnosis not present

## 2021-08-29 ENCOUNTER — Encounter: Payer: Self-pay | Admitting: Allergy

## 2021-08-29 ENCOUNTER — Ambulatory Visit (INDEPENDENT_AMBULATORY_CARE_PROVIDER_SITE_OTHER): Payer: Medicare Other | Admitting: Allergy

## 2021-08-29 VITALS — BP 130/82 | HR 78 | Temp 98.4°F | Resp 18 | Ht 65.0 in | Wt 188.4 lb

## 2021-08-29 DIAGNOSIS — J4520 Mild intermittent asthma, uncomplicated: Secondary | ICD-10-CM | POA: Diagnosis not present

## 2021-08-29 DIAGNOSIS — J3089 Other allergic rhinitis: Secondary | ICD-10-CM

## 2021-08-29 NOTE — Patient Instructions (Signed)
-   Lung function testing looks good today! ?- Testing today showed: trees and dust mites. ?- Copy of test results provided.  ?- Avoidance measures provided. ?- Agree with mold remediation in the home ?- Can take:  long-acting antihistamine like Allegra '180mg'$  daily as needed for general allergy symptom control.  ?Fluticasone nasal spray 1-2 sprays each nostril  before stopping once nasal congestion improves for maximum benefit ?- You can use an extra dose of the antihistamine, if needed, for breakthrough symptoms.  ?- Consider nasal saline rinses 1-2 times daily to remove allergens from the nasal cavities as well as help with mucous clearance (this is especially helpful to do before the nasal sprays are given) ? ?- Have access to albuterol inhaler 2 puffs every 4-6 hours as needed for cough/wheeze/shortness of breath/chest tightness.  May use 15-20 minutes prior to activity.   Monitor frequency of use.   ?- If you are not meeting the below goals or if you are having a respiratory illness or asthma flare then take your Advair inhaler 1 puff twice a day until symptoms have resolved.  ? ?Asthma control goals:  ?Full participation in all desired activities (may need albuterol before activity) ?Albuterol use two time or less a week on average (not counting use with activity) ?Cough interfering with sleep two time or less a month ?Oral steroids no more than once a year ?No hospitalizations ? ?Follow-up in 4-6 months or sooner if needed ? ?

## 2021-08-29 NOTE — Progress Notes (Signed)
? ? ?New Patient Note ? ?RE: Laura Carter MRN: [id number] DOB: 14-Apr-1941 ?Date of Office Visit: 08/29/2021 ? ?Primary care provider: Caren Macadam, MD ? ?Chief Complaint: allergies ? ?History of present illness: ?Laura Carter is a 81 y.o. female presenting today for evaluation of allergic rhinitis.  ? ?She recently found mold/mildew in the home and feels like it has likely been there a long time.  Her house was [place].  The foundation of the home is brick and states there was moisture coming up in the walls.  She feels this may be the cause of her nasal congestion, hoarseness/raspiness and cough symptoms.  She states she has been having these symptoms even prior to onset of pandemic.  She recalls going to [facility] for these symptoms and was treated for her allergies as well as bronchitis.  She does recall getting prednisone at the UC visit.   ?The mold issue in the home is getting remedied after Easter and she states it is going to be an extensive project.  She also states they have been trying to get rid of a lot of "stuff" in and around the home and is constantly exposed dust and dirt.   ?She has had recent "vacuuming" of her vents.  ?She feels the voice changes is primarily due to pollen exposure.  ?She has not tried any allergy medications outside of what she has received from UC.  ?She has asthma diagnosed as a adult.  She has been prescribed Advair which she has at home in a box and has not used it.  She has albuterol inhaler however also doesn't really use this.  She states will perform breathing technique usually to help with any breathing related symptoms.   ?She is active in reiki, yoga and "breathing yoga".  She has also done acupuncture.  ?She is also goes to a spa where she sits in a room that injects into the room [place] salt therapy. ? ?Review of systems: ?Review of Systems  ?Constitutional: Negative.   ?HENT:  Positive for congestion and voice change.   ?Eyes: Negative.    ?Respiratory: Negative.    ?Cardiovascular: Negative.   ?Gastrointestinal: Negative.   ?Musculoskeletal: Negative.   ?Skin: Negative.   ?Allergic/Immunologic: Negative.   ?Neurological: Negative.   ? ?All other systems negative unless noted above in HPI ? ?Past medical history: ?Past Medical History:  ?Diagnosis Date  ? Asthma   ? Diastolic dysfunction   ? GERD (gastroesophageal reflux disease)   ? History of hiatal hernia   ? Hypertension   ? Osteoarthritis of left knee   ? ? ?Past surgical history: ?Past Surgical History:  ?Procedure Laterality Date  ? BREAST BIOPSY Left   ? years ago- no scar ? needle biopsy only  ? CHOLECYSTECTOMY  10/10  ? COLONOSCOPY    ? ESOPHAGOGASTRODUODENOSCOPY    ? EYE SURGERY    ? cataract right  ? KNEE ARTHROSCOPY WITH MEDIAL MENISECTOMY Left 06/16/2015  ? Procedure: KNEE ARTHROSCOPY WITH PARTIAL MEDIAL MENISECTOMY;  Surgeon: Carole Civil, MD;  Location: [facility];  Service: Orthopedics;  Laterality: Left;  ? PARTIAL KNEE ARTHROPLASTY Left 07/08/2017  ? Procedure: UNICOMPARTMENTAL KNEE;  Surgeon: Renette Butters, MD;  Location: [place];  Service: Orthopedics;  Laterality: Left;  ? ? ?Family history:  ?Family History  ?Problem Relation Age of Onset  ? Cancer Mother   ?     breast  ? Stomach cancer Mother   ? Breast cancer Mother   ?  Heart disease Father   ? Heart disease Sister   ? Breast cancer Sister   ? ? ?Social history: ?Lives in a home with out carpeting with electric heating and central cooling.  Home was built in the [place].  Cat and dog in the home.  There is no concern for roaches in the home.  She does not report a current smoking or vaping status. ?Tobacco Use  ? Smoking status: Former  ?  Packs/day: 1.00  ?  Years: 10.00  ?  Pack years: 10.00  ?  Types: Cigarettes  ?  Quit date: 06/13/1965  ?  Years since quitting: 56.2  ? Smokeless tobacco: Never  ?Vaping Use  ? Vaping Use: Never used  ? ? ? ?Medication List: ?Current Outpatient Medications  ?Medication Sig Dispense Refill   ? aspirin EC 81 MG tablet Take 1 tablet (81 mg total) by mouth daily.    ? Cholecalciferol (VITAMIN D3 PO) Take 1 tablet by mouth daily. 2000 units    ? Cyanocobalamin (VITAMIN B-12 PO) Take 1 tablet by mouth daily.    ? esomeprazole (NEXIUM) 20 MG capsule Take 20 mg by mouth daily at 12 noon.    ? glucosamine-chondroitin 500-400 MG tablet Take 1 tablet by mouth 3 (three) times daily.    ? metoprolol succinate (TOPROL-XL) 25 MG 24 hr tablet TAKE (1) TABLET BY MOUTH ONCE DAILY. 90 tablet 3  ? Multiple Vitamins-Minerals (ZINC PO) Take 1 tablet by mouth daily as needed (for immune health/onset of cold).    ? pregabalin (LYRICA) 25 MG capsule Take 1 capsule (25 mg total) by mouth 2 (two) times daily. - for nerve pain 180 capsule 1  ? Turmeric 500 MG CAPS Take by mouth.    ? ADVAIR DISKUS 100-50 MCG/ACT AEPB 1 puff 2 (two) times daily. (Patient not taking: Reported on 08/29/2021)    ? albuterol (VENTOLIN HFA) 108 (90 Base) MCG/ACT inhaler Inhale 1-2 puffs into the lungs every 6 (six) hours as needed for wheezing or shortness of breath. (Patient not taking: Reported on 08/29/2021) 18 g 0  ? ?No current facility-administered medications for this visit.  ? ? ?Known medication allergies: ?No Known Allergies ? ? ?Physical examination: ?Blood pressure 130/82, pulse 78, temperature 98.4 ?F (36.9 ?C), resp. rate 18, height '5\' 5"'$  (1.651 m), weight 188 lb 6 oz (85.4 kg), SpO2 96 %. ? ?General: Alert, interactive, in no acute distress. ?HEENT: PERRLA, TMs pearly gray, turbinates mildly edematous without discharge, post-pharynx non erythematous. ?Neck: Supple without lymphadenopathy. ?Lungs: Clear to auscultation without wheezing, rhonchi or rales. {no increased work of breathing. ?CV: Normal S1, S2 without murmurs. ?Abdomen: Nondistended, nontender. ?Skin: Warm and dry, without lesions or rashes. ?Extremities:  No clubbing, cyanosis or edema. ?Neuro:   Grossly intact. ? ?Diagnositics/Labs: ? ?Spirometry: FEV1: 1.45L 74%, FVC: 1.84L  71%, ratio consistent with nonobstructive pattern for age ? ?Allergy testing:  ? Airborne Adult Perc - 08/29/21 1050   ? ? Time Antigen Placed 1051   ? Allergen Manufacturer Lavella Hammock   ? Location Back   ? Number of Test 59   ? Panel 1 Select   ? 1. Control-Buffer 50% Glycerol Negative   ? 2. Control-Histamine 1 mg/ml 2+   ? 3. Albumin saline Negative   ? 4. [place] Negative   ? 5. [place] Negative   ? 6. [place] Negative   ? 7. [place] Motte Blue Negative   ? 8. Meadow Fescue Negative   ? 9. Perennial Rye Negative   ?  10. Sweet Vernal Negative   ? 11. Timothy Negative   ? 12. Cocklebur Negative   ? 13. Burweed Marshelder Negative   ? 14. Ragweed, short Negative   ? 15. Ragweed, Giant Negative   ? 16. Plantain,  English Negative   ? 17. Lamb's Quarters Negative   ? 18. Sheep Sorrell Negative   ? 19. Rough Pigweed Negative   ? 20. Marsh Elder, Rough Negative   ? 21. Mugwort, Common Negative   ? 22. Ash mix Negative   ? 23. Wendee Copp mix Negative   ? 24. Beech American 2+   ? 25. Box, Elder 2+   ? 26. Cedar, red 2+   ? 27. Cottonwood, [place] Negative   ? 28. Elm mix Negative   ? 29. Hickory Negative   ? 30. Maple mix Negative   ? 31. Oak, [place] mix Negative   ? 32. Pecan Pollen 2+   ? 33. Pine mix Negative   ? 34. Sycamore Eastern Negative   ? 35. Edwards, Black Pollen 2+   ? 36. Alternaria alternata Negative   ? 67. Cladosporium Herbarum Negative   ? 38. Aspergillus mix Negative   ? 39. Penicillium mix Negative   ? 40. Bipolaris sorokiniana (Helminthosporium) Negative   ? 41. Drechslera spicifera (Curvularia) Negative   ? 42. Mucor plumbeus Negative   ? 43. Fusarium moniliforme Negative   ? 44. Aureobasidium pullulans (pullulara) Negative   ? 45. Rhizopus oryzae Negative   ? 46. Botrytis cinera Negative   ? 47. Epicoccum nigrum Negative   ? 48. Phoma betae Negative   ? 49. Candida Albicans Negative   ? 50. Trichophyton mentagrophytes Negative   ? 51. Mite, D Farinae  5,000 AU/ml 2+   ? 52. Mite, D Pteronyssinus  5,000 AU/ml 2+    ? 53. Cat Hair 10,000 BAU/ml Negative   ? 54.  Dog Epithelia Negative   ? 55. Mixed Feathers Negative   ? 56. Horse Epithelia Negative   ? 57. Cockroach, [place] Negative   ? 58. Mouse Negative   ? 59. Tobacco

## 2021-08-30 DIAGNOSIS — M9902 Segmental and somatic dysfunction of thoracic region: Secondary | ICD-10-CM | POA: Diagnosis not present

## 2021-08-30 DIAGNOSIS — M9903 Segmental and somatic dysfunction of lumbar region: Secondary | ICD-10-CM | POA: Diagnosis not present

## 2021-08-30 DIAGNOSIS — M5442 Lumbago with sciatica, left side: Secondary | ICD-10-CM | POA: Diagnosis not present

## 2021-08-30 DIAGNOSIS — M9905 Segmental and somatic dysfunction of pelvic region: Secondary | ICD-10-CM | POA: Diagnosis not present

## 2021-09-07 DIAGNOSIS — M9903 Segmental and somatic dysfunction of lumbar region: Secondary | ICD-10-CM | POA: Diagnosis not present

## 2021-09-07 DIAGNOSIS — M9905 Segmental and somatic dysfunction of pelvic region: Secondary | ICD-10-CM | POA: Diagnosis not present

## 2021-09-07 DIAGNOSIS — M9902 Segmental and somatic dysfunction of thoracic region: Secondary | ICD-10-CM | POA: Diagnosis not present

## 2021-09-07 DIAGNOSIS — M5442 Lumbago with sciatica, left side: Secondary | ICD-10-CM | POA: Diagnosis not present

## 2021-09-10 DIAGNOSIS — Z20822 Contact with and (suspected) exposure to covid-19: Secondary | ICD-10-CM | POA: Diagnosis not present

## 2021-09-21 DIAGNOSIS — M9903 Segmental and somatic dysfunction of lumbar region: Secondary | ICD-10-CM | POA: Diagnosis not present

## 2021-09-21 DIAGNOSIS — M9902 Segmental and somatic dysfunction of thoracic region: Secondary | ICD-10-CM | POA: Diagnosis not present

## 2021-09-21 DIAGNOSIS — M5442 Lumbago with sciatica, left side: Secondary | ICD-10-CM | POA: Diagnosis not present

## 2021-09-21 DIAGNOSIS — M9905 Segmental and somatic dysfunction of pelvic region: Secondary | ICD-10-CM | POA: Diagnosis not present

## 2021-09-29 DIAGNOSIS — Z20822 Contact with and (suspected) exposure to covid-19: Secondary | ICD-10-CM | POA: Diagnosis not present

## 2021-10-01 DIAGNOSIS — Z20822 Contact with and (suspected) exposure to covid-19: Secondary | ICD-10-CM | POA: Diagnosis not present

## 2021-10-05 DIAGNOSIS — M9905 Segmental and somatic dysfunction of pelvic region: Secondary | ICD-10-CM | POA: Diagnosis not present

## 2021-10-05 DIAGNOSIS — M5442 Lumbago with sciatica, left side: Secondary | ICD-10-CM | POA: Diagnosis not present

## 2021-10-05 DIAGNOSIS — M9903 Segmental and somatic dysfunction of lumbar region: Secondary | ICD-10-CM | POA: Diagnosis not present

## 2021-10-05 DIAGNOSIS — M9902 Segmental and somatic dysfunction of thoracic region: Secondary | ICD-10-CM | POA: Diagnosis not present

## 2021-11-02 DIAGNOSIS — M5442 Lumbago with sciatica, left side: Secondary | ICD-10-CM | POA: Diagnosis not present

## 2021-11-02 DIAGNOSIS — M9903 Segmental and somatic dysfunction of lumbar region: Secondary | ICD-10-CM | POA: Diagnosis not present

## 2021-11-02 DIAGNOSIS — M9905 Segmental and somatic dysfunction of pelvic region: Secondary | ICD-10-CM | POA: Diagnosis not present

## 2021-11-02 DIAGNOSIS — M9902 Segmental and somatic dysfunction of thoracic region: Secondary | ICD-10-CM | POA: Diagnosis not present

## 2021-11-05 ENCOUNTER — Encounter
Payer: Medicare Other | Attending: Physical Medicine and Rehabilitation | Admitting: Physical Medicine and Rehabilitation

## 2021-11-05 ENCOUNTER — Encounter: Payer: Self-pay | Admitting: Physical Medicine and Rehabilitation

## 2021-11-05 VITALS — BP 122/65 | HR 77 | Ht 65.0 in | Wt 190.0 lb

## 2021-11-05 DIAGNOSIS — M5116 Intervertebral disc disorders with radiculopathy, lumbar region: Secondary | ICD-10-CM | POA: Diagnosis not present

## 2021-11-05 DIAGNOSIS — M48062 Spinal stenosis, lumbar region with neurogenic claudication: Secondary | ICD-10-CM

## 2021-11-05 DIAGNOSIS — M792 Neuralgia and neuritis, unspecified: Secondary | ICD-10-CM

## 2021-11-05 NOTE — Progress Notes (Signed)
Subjective:    Patient ID: Laura Carter, female    DOB: 12-01-1940, 81 y.o.   MRN: [id number]  HPI Patient is an 81 yr old female with hx of asthma, HTN, GERD, here for chronic B/L thigh/buttock pain. . Severe spinal stenosis since 2018- with associated nerve pain.   Here for f/u on chronic back pain.   Pain better- not gone, and some days worse than better- mainly 3 days in last month-    Concerns about her thinking- 2x have gotten lost/map wise.   Really upset that missed luncheon this weekend- but got confused/lost with GPS Trying to rethink route.   Had another episode where got lost- going to [place], [place].   Furniture is being auctioned right now.  Her stress level has diminished.   Wondering if weight gain is due to anxiety.   Hasn't fallen, but is a little bit wobbly- furniture walker lately.   Doesn't exercise as she should, because feels aching and wobbly.   Still doing massage, chiropractor yoga and reiki.   Wall flaking into her house- had under house- older than 1850- had maximum mold/mildew- that's what set off her asthma.  Asthma started improving.     Pain Inventory Average Pain 5 Pain Right Now 1 My pain is constant, sharp, burning, and aching  In the last 24 hours, has pain interfered with the following? General activity 0 Relation with others 0 Enjoyment of life 0 What TIME of day is your pain at its worst? evening and night Sleep (in general) Fair  Pain is worse with: standing Pain improves with: rest, therapy/exercise, medication, injections, and stretching Relief from Meds:  tylenol helps some  Family History  Problem Relation Age of Onset   Cancer Mother        breast   Stomach cancer Mother    Breast cancer Mother    Heart disease Father    Heart disease Sister    Breast cancer Sister    Social History   Socioeconomic History   Marital status: Married    Spouse name: Not on file   Number of children: 2   Years of education:  Not on file   Highest education level: 10th grade  Occupational History   Not on file  Tobacco Use   Smoking status: Former    Packs/day: 1.00    Years: 10.00    Total pack years: 10.00    Types: Cigarettes    Quit date: 06/13/1965    Years since quitting: 56.4   Smokeless tobacco: Never  Vaping Use   Vaping Use: Never used  Substance and Sexual Activity   Alcohol use: No   Drug use: No   Sexual activity: Never    Birth control/protection: None  Other Topics Concern   Not on file  Social History Narrative   Grew up in [place], [place]. Married. Has one daughter and one son. Has two grandchildren.    Did not work outside the home.   Eats all food groups.    Wear seatbelt.    Went to [employer] and graduated from [employer] in [place].    Social Determinants of Health   Financial Resource Strain: Not on file  Food Insecurity: Not on file  Transportation Needs: Not on file  Physical Activity: Not on file  Stress: Not on file  Social Connections: Not on file   Past Surgical History:  Procedure Laterality Date   BREAST BIOPSY Left    years  ago- no scar ? needle biopsy only   CHOLECYSTECTOMY  10/10   COLONOSCOPY     ESOPHAGOGASTRODUODENOSCOPY     EYE SURGERY     cataract right   KNEE ARTHROSCOPY WITH MEDIAL MENISECTOMY Left 06/16/2015   Procedure: KNEE ARTHROSCOPY WITH PARTIAL MEDIAL MENISECTOMY;  Surgeon: Carole Civil, MD;  Location: [facility];  Service: Orthopedics;  Laterality: Left;   PARTIAL KNEE ARTHROPLASTY Left 07/08/2017   Procedure: UNICOMPARTMENTAL KNEE;  Surgeon: Renette Butters, MD;  Location: [facility];  Service: Orthopedics;  Laterality: Left;   Past Surgical History:  Procedure Laterality Date   BREAST BIOPSY Left    years ago- no scar ? needle biopsy only   CHOLECYSTECTOMY  10/10   COLONOSCOPY     ESOPHAGOGASTRODUODENOSCOPY     EYE SURGERY     cataract right   KNEE ARTHROSCOPY WITH MEDIAL MENISECTOMY Left 06/16/2015   Procedure: KNEE  ARTHROSCOPY WITH PARTIAL MEDIAL MENISECTOMY;  Surgeon: Carole Civil, MD;  Location: [facility];  Service: Orthopedics;  Laterality: Left;   PARTIAL KNEE ARTHROPLASTY Left 07/08/2017   Procedure: UNICOMPARTMENTAL KNEE;  Surgeon: Renette Butters, MD;  Location: [facility];  Service: Orthopedics;  Laterality: Left;   Past Medical History:  Diagnosis Date   Asthma    Diastolic dysfunction    GERD (gastroesophageal reflux disease)    History of hiatal hernia    Hypertension    Osteoarthritis of left knee    Ht '5\' 5"'$  (1.651 m)   Wt 190 lb (86.2 kg)   BMI 31.62 kg/m   Opioid Risk Score:   Fall Risk Score:  `1  Depression screen [facility] 2/9     02/05/2021   11:48 AM 11/08/2020   10:00 AM 07/12/2020   10:05 AM 05/10/2020   11:32 AM 04/03/2020    2:12 PM 12/04/2017    9:38 AM 11/18/2017    8:10 AM  Depression screen PHQ 2/9  Decreased Interest 0 0 0 0 0 0 0  Down, Depressed, Hopeless 0 0 0  0 0 0  PHQ - 2 Score 0 0 0 0 0 0 0  Altered sleeping     0    Tired, decreased energy     0    Change in appetite     0    Feeling bad or failure about yourself      0    Trouble concentrating     0    Moving slowly or fidgety/restless     0    Suicidal thoughts     0    PHQ-9 Score     0    Difficult doing work/chores     Not difficult at all      Review of Systems  Musculoskeletal:        Pain in both legs, buttocks & both knees  All other systems reviewed and are negative.      Objective:   Physical Exam Awake, alert, appropriate, sharp as a tack today, NAD  Mild muscle tightness on R>L hamstrings TTP across low back in band Intact to light touch in bottom of feet B/L       Assessment & Plan:   Patient is an 81 yr old female with hx of asthma, HTN, GERD, here for chronic B/L thigh/buttock pain. . Severe spinal stenosis since 2018- with associated nerve pain.   Here for f/u on chronic back pain.  Con't Lyrica 25 mg 2x/day-  2. Doesn't need trigger  point injections today- will do  prn.    3. We discussed memory issues- think if things continue, to see PCP to help do memory work up.    4. Use pedal bike- 30 minutes 5 days/week is goal.  Do instead of walking for exercise.    5. Suggest using cane for uneven surfaces or long distances.  You would hate falling and getting hip or ankle fracture just because you didn't use the cane?  6. F/U in 3 months for possible trigger point injections and f/u on lumbar stenosis  7. Ask about Aspergillosis since had so much mold under house.   I spent a total of  23  minutes on total care today- >50% coordination of care- due to  discussion of memory issues.

## 2021-11-05 NOTE — Patient Instructions (Signed)
Patient is an 81 yr old female with hx of asthma, HTN, GERD, here for chronic B/L thigh/buttock pain. . Severe spinal stenosis since 2018- with associated nerve pain.   Here for f/u on chronic back pain.  Con't Lyrica 25 mg 2x/day-  2. Doesn't need trigger point injections today- will do prn.    3. We discussed memory issues- think if things continue, to see PCP to help do memory work up.    4. Use pedal bike- 30 minutes 5 days/week is goal.  Do instead of walking for exercise.    5. Suggest using cane for uneven surfaces or long distances.  You would hate falling and getting hip or ankle fracture just because you didn't use the cane?  6. F/U in 3 months for possible trigger point injections and f/u on lumbar stenosis

## 2021-11-14 DIAGNOSIS — H903 Sensorineural hearing loss, bilateral: Secondary | ICD-10-CM | POA: Diagnosis not present

## 2021-11-19 DIAGNOSIS — M5442 Lumbago with sciatica, left side: Secondary | ICD-10-CM | POA: Diagnosis not present

## 2021-11-19 DIAGNOSIS — M9902 Segmental and somatic dysfunction of thoracic region: Secondary | ICD-10-CM | POA: Diagnosis not present

## 2021-11-19 DIAGNOSIS — M9905 Segmental and somatic dysfunction of pelvic region: Secondary | ICD-10-CM | POA: Diagnosis not present

## 2021-11-19 DIAGNOSIS — M9903 Segmental and somatic dysfunction of lumbar region: Secondary | ICD-10-CM | POA: Diagnosis not present

## 2021-11-26 DIAGNOSIS — M9902 Segmental and somatic dysfunction of thoracic region: Secondary | ICD-10-CM | POA: Diagnosis not present

## 2021-11-26 DIAGNOSIS — M9903 Segmental and somatic dysfunction of lumbar region: Secondary | ICD-10-CM | POA: Diagnosis not present

## 2021-11-26 DIAGNOSIS — M5442 Lumbago with sciatica, left side: Secondary | ICD-10-CM | POA: Diagnosis not present

## 2021-11-26 DIAGNOSIS — M9905 Segmental and somatic dysfunction of pelvic region: Secondary | ICD-10-CM | POA: Diagnosis not present

## 2021-11-28 DIAGNOSIS — H04123 Dry eye syndrome of bilateral lacrimal glands: Secondary | ICD-10-CM | POA: Diagnosis not present

## 2021-11-28 DIAGNOSIS — H11442 Conjunctival cysts, left eye: Secondary | ICD-10-CM | POA: Diagnosis not present

## 2021-11-28 DIAGNOSIS — H0100A Unspecified blepharitis right eye, upper and lower eyelids: Secondary | ICD-10-CM | POA: Diagnosis not present

## 2021-11-28 DIAGNOSIS — T1512XA Foreign body in conjunctival sac, left eye, initial encounter: Secondary | ICD-10-CM | POA: Diagnosis not present

## 2021-12-07 DIAGNOSIS — M9902 Segmental and somatic dysfunction of thoracic region: Secondary | ICD-10-CM | POA: Diagnosis not present

## 2021-12-07 DIAGNOSIS — M9905 Segmental and somatic dysfunction of pelvic region: Secondary | ICD-10-CM | POA: Diagnosis not present

## 2021-12-07 DIAGNOSIS — M5442 Lumbago with sciatica, left side: Secondary | ICD-10-CM | POA: Diagnosis not present

## 2021-12-07 DIAGNOSIS — M9903 Segmental and somatic dysfunction of lumbar region: Secondary | ICD-10-CM | POA: Diagnosis not present

## 2022-01-04 DIAGNOSIS — M9903 Segmental and somatic dysfunction of lumbar region: Secondary | ICD-10-CM | POA: Diagnosis not present

## 2022-01-04 DIAGNOSIS — M9902 Segmental and somatic dysfunction of thoracic region: Secondary | ICD-10-CM | POA: Diagnosis not present

## 2022-01-04 DIAGNOSIS — M5442 Lumbago with sciatica, left side: Secondary | ICD-10-CM | POA: Diagnosis not present

## 2022-01-04 DIAGNOSIS — M9905 Segmental and somatic dysfunction of pelvic region: Secondary | ICD-10-CM | POA: Diagnosis not present

## 2022-01-14 DIAGNOSIS — L570 Actinic keratosis: Secondary | ICD-10-CM | POA: Diagnosis not present

## 2022-01-16 DIAGNOSIS — N3941 Urge incontinence: Secondary | ICD-10-CM | POA: Diagnosis not present

## 2022-01-16 DIAGNOSIS — R7303 Prediabetes: Secondary | ICD-10-CM | POA: Diagnosis not present

## 2022-01-16 DIAGNOSIS — Z6379 Other stressful life events affecting family and household: Secondary | ICD-10-CM | POA: Diagnosis not present

## 2022-01-16 DIAGNOSIS — I10 Essential (primary) hypertension: Secondary | ICD-10-CM | POA: Diagnosis not present

## 2022-02-01 ENCOUNTER — Other Ambulatory Visit: Payer: Self-pay | Admitting: Family Medicine

## 2022-02-01 DIAGNOSIS — M9902 Segmental and somatic dysfunction of thoracic region: Secondary | ICD-10-CM | POA: Diagnosis not present

## 2022-02-01 DIAGNOSIS — M9905 Segmental and somatic dysfunction of pelvic region: Secondary | ICD-10-CM | POA: Diagnosis not present

## 2022-02-01 DIAGNOSIS — M5442 Lumbago with sciatica, left side: Secondary | ICD-10-CM | POA: Diagnosis not present

## 2022-02-01 DIAGNOSIS — Z1231 Encounter for screening mammogram for malignant neoplasm of breast: Secondary | ICD-10-CM

## 2022-02-01 DIAGNOSIS — M9903 Segmental and somatic dysfunction of lumbar region: Secondary | ICD-10-CM | POA: Diagnosis not present

## 2022-02-13 ENCOUNTER — Ambulatory Visit
Admission: RE | Admit: 2022-02-13 | Discharge: 2022-02-13 | Disposition: A | Discharge status: Home / Self Care | Payer: Medicare Other | Source: Ambulatory Visit | Attending: Family Medicine | Admitting: Family Medicine

## 2022-02-13 DIAGNOSIS — Z1231 Encounter for screening mammogram for malignant neoplasm of breast: Secondary | ICD-10-CM

## 2022-02-14 DIAGNOSIS — N3946 Mixed incontinence: Secondary | ICD-10-CM | POA: Diagnosis not present

## 2022-02-14 DIAGNOSIS — Z23 Encounter for immunization: Secondary | ICD-10-CM | POA: Diagnosis not present

## 2022-02-14 DIAGNOSIS — R7303 Prediabetes: Secondary | ICD-10-CM | POA: Diagnosis not present

## 2022-02-14 DIAGNOSIS — Z0000 Encounter for general adult medical examination without abnormal findings: Secondary | ICD-10-CM | POA: Diagnosis not present

## 2022-02-14 DIAGNOSIS — J4521 Mild intermittent asthma with (acute) exacerbation: Secondary | ICD-10-CM | POA: Diagnosis not present

## 2022-02-14 DIAGNOSIS — I10 Essential (primary) hypertension: Secondary | ICD-10-CM | POA: Diagnosis not present

## 2022-02-21 DIAGNOSIS — M5442 Lumbago with sciatica, left side: Secondary | ICD-10-CM | POA: Diagnosis not present

## 2022-02-21 DIAGNOSIS — M9905 Segmental and somatic dysfunction of pelvic region: Secondary | ICD-10-CM | POA: Diagnosis not present

## 2022-02-21 DIAGNOSIS — M9902 Segmental and somatic dysfunction of thoracic region: Secondary | ICD-10-CM | POA: Diagnosis not present

## 2022-02-21 DIAGNOSIS — M9903 Segmental and somatic dysfunction of lumbar region: Secondary | ICD-10-CM | POA: Diagnosis not present

## 2022-02-27 ENCOUNTER — Encounter
Payer: Medicare Other | Attending: Physical Medicine and Rehabilitation | Admitting: Physical Medicine and Rehabilitation

## 2022-02-27 ENCOUNTER — Encounter: Payer: Self-pay | Admitting: Physical Medicine and Rehabilitation

## 2022-02-27 VITALS — BP 123/76 | HR 65 | Ht 65.0 in | Wt 189.0 lb

## 2022-02-27 DIAGNOSIS — M48062 Spinal stenosis, lumbar region with neurogenic claudication: Secondary | ICD-10-CM

## 2022-02-27 DIAGNOSIS — M792 Neuralgia and neuritis, unspecified: Secondary | ICD-10-CM

## 2022-02-27 DIAGNOSIS — M5116 Intervertebral disc disorders with radiculopathy, lumbar region: Secondary | ICD-10-CM | POA: Diagnosis not present

## 2022-02-27 DIAGNOSIS — M7918 Myalgia, other site: Secondary | ICD-10-CM | POA: Diagnosis not present

## 2022-02-27 MED ORDER — PREGABALIN 25 MG PO CAPS: 25.0000 mg | ORAL_CAPSULE | Freq: Two times a day (BID) | ORAL | 1 refills | Status: DC

## 2022-02-27 MED ORDER — LIDOCAINE HCL 1 % IJ SOLN
3.0000 mL | Freq: Once | INTRAMUSCULAR | Status: AC
  Administered 2022-02-27: 3 mL

## 2022-02-27 NOTE — Progress Notes (Signed)
Patient is an 81 yr old female with hx of asthma, HTN, GERD, here for chronic B/L thigh/buttock pain. . Severe spinal stenosis since 2018- with associated nerve pain.   Here for f/u on chronic back pain.  And trigger point injections?  Last week was pretty difficult- for pain-wise Switched back to winter shoes last week- and switched back to summer shoes today.    Back to normal today- took tylenol and excedrin. Back to down legs- but last week, hurt in low back- couldn't stand up straight when got OOB daily.    3-4/10-  Weather change has also affected her lately.   Bothering her in low back-not specifically in back of legs right now  Had massage yesterday- made a difference today Was told to stretch over side of bed as well as heating pad on abdomen   Plans on Trip next week- 3 day trip to [place]- got compression socks for ride. Will take massager- for trip and on car ride.   First trip since before pandemic.   Stopped Lyrica again- doesn't want to have to take-  Does Reiki and Yoga- back to floor yoga not chair yoga; massage.  Weekly.   Back today is  "tired' not painful this AM.  Started taking Advil for pain- used last night.   Does have pedal-bike- 2-3x/week at night- ~15 minutes each time.     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   2.  Is using cane on uneven surfaces- I agree with not using every day as long on level surfaces.    3. Patient here for trigger point injections for trigger points  Consent done and on chart.  Cleaned areas with alcohol and injected using a 27 gauge 1.5 inch needle  Injected  2cc- wasted 1 cc Using 1% Lidocaine with no EPI  Upper traps Levators Posterior scalenes Middle scalenes Splenius Capitus Pectoralis Major Rhomboids Infraspinatus Teres Major/minor Thoracic paraspinals Lumbar paraspinals B/L x3-  Other injections-     Patient's level of pain prior was 3-4/10 Current level of pain after injections is- feels the same right now  There was no bleeding or complications.  Patient was advised to drink a lot of water on day after injections to flush system Will have increased soreness for 12-48 hours after injections.  Can use Lidocaine patches the day AFTER injections Can use theracane on day of injections in places didn't inject Can use heating pad 4-6 hours AFTER injections   4. F/U- 3 months on lumbar radic   I spent a total of 21-   minutes on total care today- >50% coordination of care- due to discussion of what's going on as documented above- and trigger point injections

## 2022-02-27 NOTE — Addendum Note (Signed)
Addended by: Jasmine December T on: 02/27/2022 12:06 PM   Modules accepted: Orders

## 2022-02-27 NOTE — Patient Instructions (Signed)
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   2.  Is using cane on uneven surfaces- I agree with not using every day as long on level surfaces.    3. Patient here for trigger point injections for trigger points  Consent done and on chart.  Cleaned areas with alcohol and injected using a 27 gauge 1.5 inch needle  Injected  2cc- wasted 1 cc Using 1% Lidocaine with no EPI  Upper traps Levators Posterior scalenes Middle scalenes Splenius Capitus Pectoralis Major Rhomboids Infraspinatus Teres Major/minor Thoracic paraspinals Lumbar paraspinals B/L x3-  Other injections-    Patient's level of pain prior was 3-4/10 Current level of pain after injections is- feels the same right now  There was no bleeding or complications.  Patient was advised to drink a lot of water on day after injections to flush system Will have increased soreness for 12-48 hours after injections.  Can use Lidocaine patches the day AFTER injections Can use theracane on day of injections in places didn't inject Can use heating pad 4-6 hours AFTER injections   4. F/U- 3 months on lumbar radic

## 2022-03-01 DIAGNOSIS — M9902 Segmental and somatic dysfunction of thoracic region: Secondary | ICD-10-CM | POA: Diagnosis not present

## 2022-03-01 DIAGNOSIS — M5442 Lumbago with sciatica, left side: Secondary | ICD-10-CM | POA: Diagnosis not present

## 2022-03-01 DIAGNOSIS — M9905 Segmental and somatic dysfunction of pelvic region: Secondary | ICD-10-CM | POA: Diagnosis not present

## 2022-03-01 DIAGNOSIS — M9903 Segmental and somatic dysfunction of lumbar region: Secondary | ICD-10-CM | POA: Diagnosis not present

## 2022-03-18 DIAGNOSIS — M9902 Segmental and somatic dysfunction of thoracic region: Secondary | ICD-10-CM | POA: Diagnosis not present

## 2022-03-18 DIAGNOSIS — M5442 Lumbago with sciatica, left side: Secondary | ICD-10-CM | POA: Diagnosis not present

## 2022-03-18 DIAGNOSIS — M9905 Segmental and somatic dysfunction of pelvic region: Secondary | ICD-10-CM | POA: Diagnosis not present

## 2022-03-18 DIAGNOSIS — M9903 Segmental and somatic dysfunction of lumbar region: Secondary | ICD-10-CM | POA: Diagnosis not present

## 2022-03-20 DIAGNOSIS — M9903 Segmental and somatic dysfunction of lumbar region: Secondary | ICD-10-CM | POA: Diagnosis not present

## 2022-03-20 DIAGNOSIS — M9902 Segmental and somatic dysfunction of thoracic region: Secondary | ICD-10-CM | POA: Diagnosis not present

## 2022-03-20 DIAGNOSIS — M5442 Lumbago with sciatica, left side: Secondary | ICD-10-CM | POA: Diagnosis not present

## 2022-03-20 DIAGNOSIS — M9905 Segmental and somatic dysfunction of pelvic region: Secondary | ICD-10-CM | POA: Diagnosis not present

## 2022-03-29 DIAGNOSIS — M79604 Pain in right leg: Secondary | ICD-10-CM | POA: Diagnosis not present

## 2022-03-29 DIAGNOSIS — M79605 Pain in left leg: Secondary | ICD-10-CM | POA: Diagnosis not present

## 2022-03-29 DIAGNOSIS — G8929 Other chronic pain: Secondary | ICD-10-CM | POA: Diagnosis not present

## 2022-04-03 DIAGNOSIS — M25551 Pain in right hip: Secondary | ICD-10-CM | POA: Diagnosis not present

## 2022-04-03 DIAGNOSIS — M25562 Pain in left knee: Secondary | ICD-10-CM | POA: Diagnosis not present

## 2022-04-10 DIAGNOSIS — L821 Other seborrheic keratosis: Secondary | ICD-10-CM | POA: Diagnosis not present

## 2022-04-10 DIAGNOSIS — L988 Other specified disorders of the skin and subcutaneous tissue: Secondary | ICD-10-CM | POA: Diagnosis not present

## 2022-04-10 DIAGNOSIS — C4401 Basal cell carcinoma of skin of lip: Secondary | ICD-10-CM | POA: Diagnosis not present

## 2022-04-10 DIAGNOSIS — D225 Melanocytic nevi of trunk: Secondary | ICD-10-CM | POA: Diagnosis not present

## 2022-04-10 DIAGNOSIS — L570 Actinic keratosis: Secondary | ICD-10-CM | POA: Diagnosis not present

## 2022-04-10 DIAGNOSIS — L814 Other melanin hyperpigmentation: Secondary | ICD-10-CM | POA: Diagnosis not present

## 2022-04-10 DIAGNOSIS — L578 Other skin changes due to chronic exposure to nonionizing radiation: Secondary | ICD-10-CM | POA: Diagnosis not present

## 2022-04-10 DIAGNOSIS — D2239 Melanocytic nevi of other parts of face: Secondary | ICD-10-CM | POA: Diagnosis not present

## 2022-04-10 DIAGNOSIS — D485 Neoplasm of uncertain behavior of skin: Secondary | ICD-10-CM | POA: Diagnosis not present

## 2022-04-11 DIAGNOSIS — M545 Low back pain, unspecified: Secondary | ICD-10-CM | POA: Diagnosis not present

## 2022-04-11 NOTE — Therapy (Signed)
OUTPATIENT PHYSICAL THERAPY FEMALE PELVIC EVALUATION   Patient Name: Laura Carter MRN: [id number] DOB:11-17-1940, 81 y.o., female Today's Date: 04/12/2022  END OF SESSION:  PT End of Session - 04/12/22 1214     Visit Number 1    Date for PT Re-Evaluation 07/05/22    Authorization Type Medicare    Authorization - Visit Number 1    Authorization - Number of Visits 10    PT Start Time 4403    PT Stop Time 1055    PT Time Calculation (min) 40 min    Activity Tolerance Patient tolerated treatment well    Behavior During Therapy WFL for tasks assessed/performed             Past Medical History:  Diagnosis Date   Asthma    Diastolic dysfunction    GERD (gastroesophageal reflux disease)    History of hiatal hernia    Hypertension    Osteoarthritis of left knee    Past Surgical History:  Procedure Laterality Date   BREAST BIOPSY Left    years ago- no scar ? needle biopsy only   CHOLECYSTECTOMY  10/10   COLONOSCOPY     ESOPHAGOGASTRODUODENOSCOPY     EYE SURGERY     cataract right   KNEE ARTHROSCOPY WITH MEDIAL MENISECTOMY Left 06/16/2015   Procedure: KNEE ARTHROSCOPY WITH PARTIAL MEDIAL MENISECTOMY;  Surgeon: Carole Civil, MD;  Location: [facility];  Service: Orthopedics;  Laterality: Left;   PARTIAL KNEE ARTHROPLASTY Left 07/08/2017   Procedure: UNICOMPARTMENTAL KNEE;  Surgeon: Renette Butters, MD;  Location: [facility];  Service: Orthopedics;  Laterality: Left;   Patient Active Problem List   Diagnosis Date Noted   Myofascial pain 02/27/2022   Leg length discrepancy 07/12/2020   Spinal stenosis, lumbar region, with neurogenic claudication 04/03/2020   Nerve pain 04/03/2020   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 04/03/2020   Primary osteoarthritis of knee 07/08/2017   GERD (gastroesophageal reflux disease) 06/16/2017   Medial meniscus, posterior horn derangement    Arthritis of knee    Primary osteoarthritis of left knee 47/42/5956    Diastolic dysfunction     PCP: Caren Macadam, MD  REFERRING PROVIDER: Caren Macadam, MD   REFERRING DIAG: N39.46 (ICD-10-CM) - Mixed stress and urge urinary incontinence   THERAPY DIAG:  Other lack of coordination  Rationale for Evaluation and Treatment: Rehabilitation  ONSET DATE: 2013  SUBJECTIVE:  SUBJECTIVE STATEMENT: Several months ago had 4 incidences and stood up without getting to the bathroom quickly.  Fluid intake: Yes: water    PAIN:  Are you having pain? No, pelvic pain   PRECAUTIONS: None  WEIGHT BEARING RESTRICTIONS: No  FALLS:  Has patient fallen in last 6 months? No  LIVING ENVIRONMENT: Lives with: lives with their family   OCCUPATION: retired, yoga 1 time per week, massage, [employer]  PLOF: Independent  PATIENT GOALS: stop peeing on herself  PERTINENT HISTORY:  Cholecystomy 10/10; Partial left knee arthroplasty 06/07/2017  BOWEL MOVEMENT: Pain with bowel movement: No Type of bowel movement:Strain Yes, sometimes external hemorrhoids will come out Fully empty rectum: Yes:   Leakage: No  URINATION: Pain with urination: No Fully empty bladder: Yes: nighttime does not fully empty due to a slow flow Stream:  during the day the urine flow is fine and night time is slow Urgency: Yes: starting to have this for the past few months, in the car and get out Frequency: day: morning a lot, afternoon slows down due to being busy Leakage: Urge to void, Walking to the bathroom, and does not always notice Pads: Yes: 1 light day; no pad a night  PREGNANCY: Vaginal deliveries 2 Tearing No  PROLAPSE: None   OBJECTIVE:   DIAGNOSTIC FINDINGS:  none    COGNITION: Overall cognitive status: Within functional limits for tasks assessed      GAIT: Assistive device  utilized: Single point cane, when she wants to use it Level of assistance: Complete Independence Comments: limps on the right due to her lumbar stenosis  POSTURE: rounded shoulders, forward head, and decreased lumbar lordosis  PELVIC ALIGNMENT: ASIS are equal  LUMBARAROM/PROM: lumbar ROM limited by 50% due to her back issues  LOWER EXTREMITY ROM: Bilateral hips ROM is WNL   LOWER EXTREMITY MMT:  Bilateral hip strength is 4/5  + PALPATION:   General  Hernia on the upper abdominal after she had her gall bladder removed. Patient will bulge her abdomen instead of contraction.                 External Perineal Exam intact                             Internal Pelvic Floor tightness in the introitus  Patient confirms identification and approves PT to assess internal pelvic floor and treatment Yes  PELVIC MMT:   MMT eval  Vaginal 1/5 with contracting the buttocks and  posterior pelvic tilt  (Blank rows = not tested)        TONE: low  PROLAPSE: Posterior wall weakness that comes to the introitus  TODAY'S TREATMENT:                                                                                                                              DATE: 11/17/223  EVAL see below   PATIENT  EDUCATION:  Education details: Access Code: [email or web address] Person educated: Patient Education method: Explanation, Demonstration, Tactile cues, Verbal cues, and Handouts Education comprehension: verbalized understanding, returned demonstration, verbal cues required, tactile cues required, and needs further education  HOME EXERCISE PROGRAM: 04/12/2022 Access Code: [email or web address] URL: [email or web address] Date: 04/12/2022 Prepared by: Earlie Counts  Exercises - Supine Hip Adduction Isometric with Ball  - 3 x daily - 7 x weekly - 1 sets - 5 reps - 5 sec hold  ASSESSMENT:  CLINICAL IMPRESSION: Patient is a 81 y.o. female who was seen today for physical therapy evaluation and treatment for  Mixed urinary incontinence. Patient reports the urinary leakage has become worse in the past few years. She leaks urine with urge to void, while she is walking to the bathroom and randomly. She reports her urgency is worse in the past few months. She uses a light day pad for the day. Patient reports her urine flow during the day is fine but at night is slow. Pelvic floor strength is  1/5 with contracting the buttocks and  posterior pelvic tilt. Patient will protrude the abdomen. Patient will benefit from skilled therapy to improve pelvic floor coordination and strength to reduce her leakage.   OBJECTIVE IMPAIRMENTS: decreased coordination, decreased endurance, decreased strength, and increased fascial restrictions.   ACTIVITY LIMITATIONS: standing, continence, toileting, and locomotion level  PARTICIPATION LIMITATIONS: shopping and community activity  PERSONAL FACTORS: Age, Fitness, and 1-2 comorbidities: Cholecystomy 10/10; Partial left knee arthroplasty 06/07/2017  are also affecting patient's functional outcome.   REHAB POTENTIAL: Good  CLINICAL DECISION MAKING: Stable/uncomplicated  EVALUATION COMPLEXITY: Low   GOALS: Goals reviewed with patient? Yes  SHORT TERM GOALS: Target date: 05/10/2022  Patient independent with initial exercises for pelvic floor strength.  Baseline: Goal status: INITIAL  2.  Patient educated on pelvic floor health and vaginal moisturizers.  Baseline:  Goal status: INITIAL  3.  Pelvic floor strength increased >/= 2/5 with a circular contraction and no substitutions.  Baseline:  Goal status: INITIAL    LONG TERM GOALS: Target date: 07/05/2021  Patient independent with advanced HEP for pelvic floor strength Baseline:  Goal status: INITIAL  2.  Patient pelvic strength is >/= 3/5 and reports her urinary leakage is </= 75% Baseline:  Goal status: INITIAL  3.  Patient is able to contract her abdominals without bulging and placing pressure on the pelvic  floor.  Baseline:  Goal status: INITIAL  4.  Patient is able to walk to the bathroom without leaking urine due to increased pelvic floor coordination and strength.  Baseline:  Goal status: INITIAL   PLAN:  PT FREQUENCY: 1x/week  PT DURATION: 12 weeks  PLANNED INTERVENTIONS: Therapeutic exercises, Therapeutic activity, Neuromuscular re-education, Patient/Family education, Self Care, Biofeedback, and Manual therapy  PLAN FOR NEXT SESSION: Manual work to the pelvic floor for a circular contraction work on abdominal contraction.    Earlie Counts, PT 04/12/22 12:36 PM

## 2022-04-12 ENCOUNTER — Encounter: Payer: Self-pay | Admitting: Physical Therapy

## 2022-04-12 ENCOUNTER — Ambulatory Visit: Payer: Medicare Other | Attending: Family Medicine | Admitting: Physical Therapy

## 2022-04-12 ENCOUNTER — Other Ambulatory Visit: Payer: Self-pay

## 2022-04-12 DIAGNOSIS — R278 Other lack of coordination: Secondary | ICD-10-CM | POA: Diagnosis not present

## 2022-04-12 DIAGNOSIS — M6281 Muscle weakness (generalized): Secondary | ICD-10-CM | POA: Diagnosis not present

## 2022-04-25 DIAGNOSIS — C4401 Basal cell carcinoma of skin of lip: Secondary | ICD-10-CM | POA: Diagnosis not present

## 2022-05-09 DIAGNOSIS — D649 Anemia, unspecified: Secondary | ICD-10-CM | POA: Diagnosis not present

## 2022-05-09 DIAGNOSIS — R7303 Prediabetes: Secondary | ICD-10-CM | POA: Diagnosis not present

## 2022-05-09 DIAGNOSIS — E7800 Pure hypercholesterolemia, unspecified: Secondary | ICD-10-CM | POA: Diagnosis not present

## 2022-05-09 DIAGNOSIS — I10 Essential (primary) hypertension: Secondary | ICD-10-CM | POA: Diagnosis not present

## 2022-05-10 ENCOUNTER — Ambulatory Visit: Payer: Medicare Other | Attending: Family Medicine | Admitting: Physical Therapy

## 2022-05-10 ENCOUNTER — Encounter: Payer: Self-pay | Admitting: Physical Therapy

## 2022-05-10 DIAGNOSIS — R278 Other lack of coordination: Secondary | ICD-10-CM | POA: Insufficient documentation

## 2022-05-10 DIAGNOSIS — R262 Difficulty in walking, not elsewhere classified: Secondary | ICD-10-CM | POA: Insufficient documentation

## 2022-05-10 DIAGNOSIS — M6281 Muscle weakness (generalized): Secondary | ICD-10-CM | POA: Insufficient documentation

## 2022-05-10 NOTE — Therapy (Signed)
OUTPATIENT PHYSICAL THERAPY TREATMENT NOTE   Patient Name: Laura Carter MRN: [id number] DOB:June 17, 1940, 81 y.o., female Today's Date: 05/10/2022  PCP: Caren Macadam, MD  REFERRING PROVIDER: Caren Macadam, MD   END OF SESSION:   PT End of Session - 05/10/22 1015     Visit Number 2    Date for PT Re-Evaluation 07/05/22    Authorization Type Medicare    Authorization - Visit Number 2    Authorization - Number of Visits 10    PT Start Time 3254    PT Stop Time 1055    PT Time Calculation (min) 40 min    Activity Tolerance Patient tolerated treatment well    Behavior During Therapy WFL for tasks assessed/performed             Past Medical History:  Diagnosis Date   Asthma    Diastolic dysfunction    GERD (gastroesophageal reflux disease)    History of hiatal hernia    Hypertension    Osteoarthritis of left knee    Past Surgical History:  Procedure Laterality Date   BREAST BIOPSY Left    years ago- no scar ? needle biopsy only   CHOLECYSTECTOMY  10/10   COLONOSCOPY     ESOPHAGOGASTRODUODENOSCOPY     EYE SURGERY     cataract right   KNEE ARTHROSCOPY WITH MEDIAL MENISECTOMY Left 06/16/2015   Procedure: KNEE ARTHROSCOPY WITH PARTIAL MEDIAL MENISECTOMY;  Surgeon: Carole Civil, MD;  Location: [facility];  Service: Orthopedics;  Laterality: Left;   PARTIAL KNEE ARTHROPLASTY Left 07/08/2017   Procedure: UNICOMPARTMENTAL KNEE;  Surgeon: Renette Butters, MD;  Location: [facility];  Service: Orthopedics;  Laterality: Left;   Patient Active Problem List   Diagnosis Date Noted   Myofascial pain 02/27/2022   Leg length discrepancy 07/12/2020   Spinal stenosis, lumbar region, with neurogenic claudication 04/03/2020   Nerve pain 04/03/2020   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 I am not going to the bathroom as much. I can stop the urine flow a little when contract the pelvic floor.  Fluid intake: Yes: water     PAIN:  Are you having pain? No, pelvic pain     PRECAUTIONS: None   WEIGHT BEARING RESTRICTIONS: No   FALLS:  Has patient fallen in last 6 months? No   LIVING ENVIRONMENT: Lives with: lives with their family     OCCUPATION: retired, yoga 1 time per week, massage, [employer]   PLOF: Independent   PATIENT GOALS: stop peeing on herself   PERTINENT HISTORY:  Cholecystomy 10/10; Partial left knee arthroplasty 06/07/2017   BOWEL MOVEMENT: Pain with bowel movement: No Type of bowel movement:Strain Yes, sometimes external hemorrhoids will come out Fully empty rectum: Yes:   Leakage: No   URINATION: Pain with urination: No Fully empty bladder: Yes: nighttime does not fully empty due to a slow flow Stream:  during the day the urine flow is fine and night time is slow Urgency: Yes: starting to have this for the past few months, in the car and get out Frequency: day: morning a lot, afternoon slows down due to being busy Leakage: Urge to void, Walking to the bathroom, and does not always notice Pads: Yes: 1 light day; no pad a night   PREGNANCY: Vaginal deliveries 2 Tearing No   PROLAPSE: None    OBJECTIVE: (objective measures completed at initial evaluation unless otherwise dated)    DIAGNOSTIC FINDINGS:  none        COGNITION: Overall cognitive status: Within functional limits for tasks assessed                            GAIT: Assistive device utilized: Single point cane, when she wants to use it Level of assistance: Complete Independence Comments: limps on the right due to her lumbar stenosis   POSTURE: rounded shoulders, forward head, and decreased lumbar lordosis   PELVIC ALIGNMENT: ASIS are equal   LUMBARAROM/PROM: lumbar ROM limited by 50% due to her back issues   LOWER EXTREMITY ROM: Bilateral hips ROM is WNL     LOWER EXTREMITY MMT:  Bilateral hip strength is 4/5   + PALPATION:   General  Hernia on the upper abdominal after she had her gall bladder removed. Patient will bulge her abdomen instead of contraction.                  External Perineal Exam intact                             Internal Pelvic Floor tightness in the introitus   Patient confirms identification and approves PT to assess internal pelvic floor and treatment Yes   PELVIC MMT:   MMT eval  Vaginal 1/5 with contracting the buttocks and  posterior pelvic tilt  (Blank rows = not tested)         TONE: low   PROLAPSE: Posterior wall weakness that comes to the introitus   TODAY'S TREATMENT:  05/10/2022 Neuromuscular re-education: Pelvic floor contraction training: Supine ball squeeze with pelvic floor and abdominal contraction  Supine ball squeeze with bridge but hurt her back Supine knee fall out with pelvic floor contraction and abdominal bracing Supine hip flexion isometric Therapeutic activities: Functional strengthening activities: Discussed with patient on vaginal health to reduce the irritation of  the vaginal area. Educated patient on vaginal moisturizers.                                                                                                                                 PATIENT EDUCATION:  05/10/2022 Education details: Access Code: [email or web address],educated patient on vaginal  moisturizers and vaginal health Person educated: Patient Education method: Explanation, Demonstration, Tactile cues, Verbal cues, and Handouts Education comprehension: verbalized understanding, returned demonstration, verbal cues required, tactile cues required, and needs further education   HOME EXERCISE PROGRAM: 05/10/2022 Access Code: [email or web address] URL: [email or web address] Date: 05/10/2022 Prepared by: Earlie Counts  Exercises - Supine Hip Adduction Isometric with Ball  - 3 x daily - 7 x weekly - 1 sets - 5 reps - 5 sec hold - Hooklying Isometric Hip Flexion  - 1 x daily - 7 x weekly - 1 sets - 10 reps - Bent Knee Fallouts  - 1 x daily - 7 x weekly - 3 sets - 10 reps   ASSESSMENT:   CLINICAL IMPRESSION: Patient is a 81 y.o. female who was seen today for physical therapy  treatment for Mixed urinary incontinence. Patient is not able to do bridges due to back pain. Therapist felt the pelvic floor contract through her pants and patient is able to feel it. She knows if she is contracting her gluteal and will correct herself. Patient understands vaginal health and vaginal moisturizers. Patient will benefit from skilled therapy to improve pelvic floor coordination and strength to reduce her leakage.    OBJECTIVE IMPAIRMENTS: decreased coordination, decreased endurance, decreased strength, and increased fascial restrictions.    ACTIVITY LIMITATIONS: standing, continence, toileting, and locomotion level   PARTICIPATION LIMITATIONS: shopping and community activity   PERSONAL FACTORS: Age, Fitness, and 1-2 comorbidities: Cholecystomy 10/10; Partial left knee arthroplasty 06/07/2017  are also affecting patient's functional outcome.    REHAB POTENTIAL: Good   CLINICAL DECISION MAKING: Stable/uncomplicated   EVALUATION COMPLEXITY: Low     GOALS: Goals reviewed with patient? Yes   SHORT TERM GOALS: Target date: 05/10/2022   Patient independent with initial exercises for pelvic  floor strength.  Baseline: Goal status: Met 05/10/2022   2.  Patient educated on pelvic floor health and vaginal moisturizers.  Baseline:  Goal status: Met 05/10/2022   3.  Pelvic floor strength increased >/= 2/5 with a circular contraction and no substitutions.  Baseline:  Goal status: INITIAL       LONG TERM GOALS: Target date: 07/05/2021   Patient independent with advanced HEP for pelvic floor strength Baseline:  Goal status: INITIAL   2.  Patient pelvic strength is >/= 3/5 and reports her urinary leakage is </= 75% Baseline:  Goal status: INITIAL   3.  Patient is able to contract her abdominals without bulging and placing pressure on the pelvic floor.  Baseline:  Goal status: INITIAL   4.  Patient is able to walk to the bathroom without leaking urine  due to increased pelvic floor coordination and strength.  Baseline:  Goal status: INITIAL     PLAN:   PT FREQUENCY: 1x/week   PT DURATION: 12 weeks   PLANNED INTERVENTIONS: Therapeutic exercises, Therapeutic activity, Neuromuscular re-education, Patient/Family education, Self Care, Biofeedback, and Manual therapy   PLAN FOR NEXT SESSION: Manual work to the pelvic floor for a circular contraction work on abdominal contraction if patient does not mind, progress exercises.    Earlie Counts, PT 05/10/22 10:59 AM

## 2022-05-10 NOTE — Patient Instructions (Signed)
Moisturizers They are used in the vagina to hydrate the mucous membrane that make up the vaginal canal. Designed to keep a more normal acid balance (ph) Once placed in the vagina, it will last between two to three days.  Use 2-3 times per week at bedtime  Ingredients to avoid is glycerin and fragrance, can increase chance of infection Should not be used just before sex due to causing irritation Most are gels administered either in a tampon-shaped applicator or as a vaginal suppository. They are non-hormonal.   Types of Moisturizers(internal use)  Vitamin E vaginal suppositories- Whole foods, Amazon Moist Again Coconut oil- can break down condoms Julva- (Do no use if on Tamoxifen) amazon Yes moisturizer- amazon NeuEve Silk , NeuEve Silver for menopausal or over 65 (if have severe vaginal atrophy or cancer treatments use NeuEve Silk for  1 month than move to NeuEve Silver)- Amazon, Neuve.com Olive and Bee intimate cream- www.oliveandbee.com.au Mae vaginal moisturizer- Amazon Aloe Good Clean Love Hyaluronic acid Hyalofemme replens   Creams to use externally on the Vulva area Desert Harvest Releveum (good for for cancer patients that had radiation to the area)- amazon or www.desertharvest.com V-magic cream - amazon Julva-amazon Vital "V Wild Yam salve ( help moisturize and help with thinning vulvar area, does have Beeswax MoodMaid Botanical Pro-Meno Wild Yam Cream- Amazon Desert Harvest Gele Cleo by Damiva labial moisturizer (Amazon,  Coconut or olive oil aloe Good Clean Love Enchanted Rose by intimate rose  Things to avoid in the vaginal area Do not use things to irritate the vulvar area No lotions just specialized creams for the vulva area- Neogyn, V-magic, No soaps; can use Aveeno or Calendula cleanser if needed. Must be gentle No deodorants No douches Good to sleep without underwear to let the vaginal area to air out No scrubbing: spread the lips to let warm water rinse  over labias and pat dry  [facility] [address], [address] [place], [place] [address] Phone # [phone] Fax [phone]  

## 2022-05-13 ENCOUNTER — Ambulatory Visit: Payer: Medicare Other | Admitting: Physical Therapy

## 2022-05-28 DIAGNOSIS — Z5189 Encounter for other specified aftercare: Secondary | ICD-10-CM | POA: Diagnosis not present

## 2022-05-29 ENCOUNTER — Ambulatory Visit: Payer: Medicare Other | Admitting: Physical Therapy

## 2022-06-03 ENCOUNTER — Ambulatory Visit: Payer: BLUE CROSS/BLUE SHIELD | Admitting: Physical Medicine and Rehabilitation

## 2022-06-03 ENCOUNTER — Ambulatory Visit: Payer: Medicare Other | Admitting: Physical Therapy

## 2022-06-03 ENCOUNTER — Telehealth: Payer: Self-pay | Admitting: Physical Therapy

## 2022-06-03 DIAGNOSIS — M48061 Spinal stenosis, lumbar region without neurogenic claudication: Secondary | ICD-10-CM | POA: Diagnosis not present

## 2022-06-03 NOTE — Telephone Encounter (Signed)
Called patient about her missed appointment today at 11:45. Left a message.  Earlie Counts, PT '@1'$ /8/24@ 12:08 PM

## 2022-06-10 ENCOUNTER — Ambulatory Visit: Payer: Medicare Other | Attending: Family Medicine | Admitting: Physical Therapy

## 2022-06-10 ENCOUNTER — Encounter: Payer: Self-pay | Admitting: Physical Therapy

## 2022-06-10 DIAGNOSIS — M6281 Muscle weakness (generalized): Secondary | ICD-10-CM | POA: Diagnosis not present

## 2022-06-10 DIAGNOSIS — R262 Difficulty in walking, not elsewhere classified: Secondary | ICD-10-CM | POA: Insufficient documentation

## 2022-06-10 DIAGNOSIS — R278 Other lack of coordination: Secondary | ICD-10-CM | POA: Insufficient documentation

## 2022-06-10 NOTE — Therapy (Signed)
OUTPATIENT PHYSICAL THERAPY TREATMENT NOTE   Patient Name: Laura Carter MRN: [id number] DOB:Sep 19, 1940, 82 y.o., female Today's Date: 06/10/2022  PCP: Caren Macadam, MD  REFERRING PROVIDER: Caren Macadam, MD   END OF SESSION:   PT End of Session - 06/10/22 1145     Visit Number 3    Date for PT Re-Evaluation 07/05/22    Authorization Type Medicare    Authorization - Visit Number 3    Authorization - Number of Visits 10    PT Start Time 0488    PT Stop Time 1225    PT Time Calculation (min) 40 min    Activity Tolerance Patient tolerated treatment well    Behavior During Therapy WFL for tasks assessed/performed             Past Medical History:  Diagnosis Date   Asthma    Diastolic dysfunction    GERD (gastroesophageal reflux disease)    History of hiatal hernia    Hypertension    Osteoarthritis of left knee    Past Surgical History:  Procedure Laterality Date   BREAST BIOPSY Left    years ago- no scar ? needle biopsy only   CHOLECYSTECTOMY  10/10   COLONOSCOPY     ESOPHAGOGASTRODUODENOSCOPY     EYE SURGERY     cataract right   KNEE ARTHROSCOPY WITH MEDIAL MENISECTOMY Left 06/16/2015   Procedure: KNEE ARTHROSCOPY WITH PARTIAL MEDIAL MENISECTOMY;  Surgeon: Carole Civil, MD;  Location: [facility];  Service: Orthopedics;  Laterality: Left;   PARTIAL KNEE ARTHROPLASTY Left 07/08/2017   Procedure: UNICOMPARTMENTAL KNEE;  Surgeon: Renette Butters, MD;  Location: [facility];  Service: Orthopedics;  Laterality: Left;   Patient Active Problem List   Diagnosis Date Noted   Myofascial pain 02/27/2022   Leg length discrepancy 07/12/2020   Spinal stenosis, lumbar region, with neurogenic claudication 04/03/2020   Nerve pain 04/03/2020   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 Patient is able to fully empty her bladder.    PAIN:  Are you having pain? No, pelvic pain     PRECAUTIONS: None   WEIGHT BEARING RESTRICTIONS: No   FALLS:  Has patient fallen in last 6 months? No   LIVING ENVIRONMENT: Lives with: lives with their family     OCCUPATION: retired, yoga 1 time per week, massage, [employer]   PLOF: Independent   PATIENT GOALS: stop peeing on herself   PERTINENT HISTORY:  Cholecystomy 10/10; Partial left knee arthroplasty 06/07/2017   BOWEL MOVEMENT: Pain with bowel movement: No Type of bowel movement:Strain Yes, sometimes external hemorrhoids will come out Fully empty rectum: Yes:   Leakage: No   URINATION: Pain with urination: No Fully empty bladder: Yes: nighttime does not fully empty due to a slow flow Stream:  during the day the urine flow is fine and night time is slow Urgency: Yes: starting to have this for the past few months, in the car and get out Frequency: day: morning a lot, afternoon slows down due to being busy Leakage: Urge to void, Walking to the bathroom, and does not always notice Pads: Yes: 1 light day; no pad a night   PREGNANCY: Vaginal deliveries 2 Tearing No   PROLAPSE: None    OBJECTIVE: (objective measures completed at initial evaluation unless otherwise dated)     DIAGNOSTIC FINDINGS:  none       COGNITION: Overall  cognitive status: Within functional limits for tasks assessed                            GAIT: Assistive device utilized: Single point cane, when she wants to use it Level of assistance: Complete Independence Comments: limps on the right due to her lumbar stenosis   POSTURE: rounded shoulders, forward head, and decreased lumbar lordosis   PELVIC ALIGNMENT: ASIS are equal   LUMBARAROM/PROM: lumbar ROM limited by 50% due to her back issues   LOWER EXTREMITY ROM: Bilateral hips ROM is WNL     LOWER EXTREMITY MMT:  Bilateral hip strength is 4/5   + PALPATION:   General  Hernia on the upper abdominal after she had her gall bladder removed. Patient will bulge her abdomen instead of contraction.                  External Perineal Exam intact                             Internal Pelvic Floor tightness in the introitus   Patient confirms identification and approves PT to assess internal pelvic floor and treatment no ; patient prefers from now on to not have internal muscle assessment  PELVIC MMT:   MMT eval  Vaginal 1/5 with contracting the buttocks and  posterior pelvic tilt  (Blank rows = not tested)         TONE: low   PROLAPSE: Posterior wall weakness that comes to the introitus   TODAY'S TREATMENT:  06/10/22 Neuromuscular re-education: Pelvic floor contraction training: Contract in sitting for 10 sec 10x working on feeling the contraction and if she is substituting, she will touch her buttocks to make sure they are not contracting.  Supine knee fall out with pelvic floor contraction and abdominal bracing 2 x 10 Supine  hip flexion isometric with pressing hand into thigh for 5 sec and contract the lower abdomen 2 x 10 Sitting ball squeeze holding for 5 sec 10x    05/10/2022 Neuromuscular re-education: Pelvic floor contraction training: Supine ball squeeze with pelvic floor and abdominal contraction  Supine ball squeeze with bridge but hurt her back Supine knee fall out  with pelvic floor contraction and abdominal bracing Supine hip flexion isometric Therapeutic activities: Functional strengthening activities: Discussed with patient on vaginal health to reduce the irritation of the vaginal area. Educated patient on vaginal moisturizers.                                                                                                                                 PATIENT EDUCATION:  06/10/2022 Education details: Access Code: [email or web address],educated patient on vaginal moisturizers and vaginal health Person educated: Patient Education method: Explanation, Demonstration, Tactile cues, Verbal cues, and Handouts Education comprehension: verbalized understanding, returned demonstration, verbal cues required, tactile cues required, and needs further education   HOME EXERCISE PROGRAM: 06/10/2022 Access Code: [email or web address] URL: [email or web address] Date: 06/10/2022 Prepared by: Earlie Counts  Exercises - Supine Hip Adduction Isometric with Ball  - 3 x daily - 3 x weekly - 1 sets - 5 reps - 5 sec hold - Hooklying Isometric Hip Flexion  - 1 x daily - 3 x weekly - 1 sets - 10 reps - Bent Knee Fallouts  - 1 x daily - 3 x weekly - 3 sets - 10 reps - Seated Pelvic Floor Contraction  - 3 x daily - 7 x weekly - 1 sets - 10 reps - 10 sec hold - Seated Quick Flick Pelvic Floor Contractions  - 3 x daily - 7 x weekly - 1 sets - 5 reps - Seated Hip Adduction Squeeze with Ball  - 1 x daily - 3 x weekly - 2 sets - 10 reps   ASSESSMENT:   CLINICAL IMPRESSION: Patient is a 82 y.o. female who was seen today for physical therapy  treatment for Mixed urinary incontinence. Patient is not able to do bridges due to back pain. Patient is able to stop a weak urine stream compared to a strong. Patient does her exercises more in sitting in the car. She continues to have urinary leakage but will work harder on her HEP. She is more aware of isolating her pelvic floor contraction and not  using her gluteals.  Patient will benefit from skilled therapy to improve pelvic floor coordination and strength to reduce her leakage.    OBJECTIVE IMPAIRMENTS: decreased coordination, decreased endurance, decreased strength, and increased fascial restrictions.    ACTIVITY LIMITATIONS: standing, continence, toileting, and locomotion level   PARTICIPATION LIMITATIONS: shopping and community activity   PERSONAL FACTORS: Age, Fitness, and 1-2 comorbidities: Cholecystomy 10/10; Partial left knee arthroplasty 06/07/2017  are also affecting patient's functional outcome.    REHAB POTENTIAL: Good   CLINICAL DECISION MAKING: Stable/uncomplicated   EVALUATION  COMPLEXITY: Low     GOALS: Goals reviewed with patient? Yes   SHORT TERM GOALS: Target date: 05/10/2022   Patient independent with initial exercises for pelvic floor strength.  Baseline: Goal status: Met 05/10/2022   2.  Patient educated on pelvic floor health and vaginal moisturizers.  Baseline:  Goal status: Met 05/10/2022   3.  Pelvic floor strength increased >/= 2/5 with a circular contraction and no substitutions.  Baseline:  Goal status: INITIAL       LONG TERM GOALS: Target date: 07/05/2021   Patient independent with advanced HEP for pelvic floor strength Baseline:  Goal status: INITIAL   2.  Patient pelvic strength is >/= 3/5 and reports her urinary leakage is </= 75% Baseline:  Goal status: INITIAL   3.  Patient is able to contract her abdominals without bulging and placing pressure on the pelvic floor.  Baseline:  Goal status: INITIAL   4.  Patient is able to walk to the bathroom without leaking urine due to increased pelvic floor coordination and strength.  Baseline:  Goal status: INITIAL     PLAN:   PT FREQUENCY: 1x/week   PT DURATION: 12 weeks   PLANNED INTERVENTIONS: Therapeutic exercises, Therapeutic activity, Neuromuscular re-education, Patient/Family education, Self Care, Biofeedback, and Manual  therapy   PLAN FOR NEXT SESSION:  work on abdominal contraction ,  progress exercises.   Earlie Counts, PT 06/10/22 12:44 PM

## 2022-06-17 ENCOUNTER — Encounter: Payer: Self-pay | Admitting: Physical Therapy

## 2022-06-17 ENCOUNTER — Ambulatory Visit: Payer: Medicare Other | Admitting: Physical Therapy

## 2022-06-17 DIAGNOSIS — R278 Other lack of coordination: Secondary | ICD-10-CM

## 2022-06-17 DIAGNOSIS — M6281 Muscle weakness (generalized): Secondary | ICD-10-CM

## 2022-06-17 DIAGNOSIS — R262 Difficulty in walking, not elsewhere classified: Secondary | ICD-10-CM | POA: Diagnosis not present

## 2022-06-17 NOTE — Therapy (Signed)
OUTPATIENT PHYSICAL THERAPY TREATMENT NOTE   Patient Name: Laura Carter MRN: [id number] DOB:[birth year], 82 y.o., female Today's Date: 06/17/2022  PCP: Caren Macadam, MD   REFERRING PROVIDER: Caren Macadam, MD    END OF SESSION:   PT End of Session - 06/17/22 1150     Visit Number 4    Date for PT Re-Evaluation 07/05/22    Authorization Type Medicare    Authorization - Visit Number 4    Authorization - Number of Visits 10    PT Start Time 1150    PT Stop Time 1225    PT Time Calculation (min) 35 min    Activity Tolerance Patient tolerated treatment well    Behavior During Therapy WFL for tasks assessed/performed             Past Medical History:  Diagnosis Date   Asthma    Diastolic dysfunction    GERD (gastroesophageal reflux disease)    History of hiatal hernia    Hypertension    Osteoarthritis of left knee    Past Surgical History:  Procedure Laterality Date   BREAST BIOPSY Left    years ago- no scar ? needle biopsy only   CHOLECYSTECTOMY  10/10   COLONOSCOPY     ESOPHAGOGASTRODUODENOSCOPY     EYE SURGERY     cataract right   KNEE ARTHROSCOPY WITH MEDIAL MENISECTOMY Left 06/16/2015   Procedure: KNEE ARTHROSCOPY WITH PARTIAL MEDIAL MENISECTOMY;  Surgeon: Carole Civil, MD;  Location: [facility];  Service: Orthopedics;  Laterality: Left;   PARTIAL KNEE ARTHROPLASTY Left 07/08/2017   Procedure: UNICOMPARTMENTAL KNEE;  Surgeon: Renette Butters, MD;  Location: [place];  Service: Orthopedics;  Laterality: Left;   Patient Active Problem List   Diagnosis Date Noted   Myofascial pain 02/27/2022   Leg length discrepancy 07/12/2020   Spinal stenosis, lumbar region, with neurogenic claudication 04/03/2020   Nerve pain 04/03/2020   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 Patient is able to fully empty her bladder. I have less leakage. I have not had the urgency to go into the bathroom when I got home.    PAIN:  Are you having pain? No, pelvic pain     PRECAUTIONS: None   WEIGHT BEARING RESTRICTIONS: No   FALLS:  Has patient fallen in last 6 months? No   LIVING ENVIRONMENT: Lives with: lives with their family     OCCUPATION: retired, yoga 1 time per week, massage, [employer]   PLOF: Independent   PATIENT GOALS: stop peeing on herself   PERTINENT HISTORY:  Cholecystomy 10/10; Partial left knee arthroplasty 06/07/2017   BOWEL MOVEMENT: Pain with bowel movement: No Type of bowel movement:Strain Yes, sometimes external hemorrhoids will come out Fully empty rectum: Yes:   Leakage: No   URINATION: Pain with urination: No Fully empty bladder: Yes: nighttime does not fully empty due to a slow flow Stream:  during the day the urine flow is fine and night time is slow Urgency: Yes: starting to have this for the past few months, in the car and get out Frequency: day: morning a lot, afternoon slows down due to being busy Leakage: Urge to void, Walking to the bathroom, and does not always notice Pads: Yes: 1 light day; no pad a night   PREGNANCY: Vaginal deliveries 2 Tearing No   PROLAPSE: None    OBJECTIVE: (objective measures completed at initial evaluation  unless otherwise dated)     DIAGNOSTIC FINDINGS:  none       COGNITION: Overall cognitive status: Within functional limits for tasks assessed                            GAIT: Assistive device utilized: Single point cane, when she wants to use it Level of assistance: Complete Independence Comments: limps on the right due to her lumbar stenosis   POSTURE: rounded shoulders, forward head, and decreased lumbar lordosis   PELVIC ALIGNMENT: ASIS are equal   LUMBARAROM/PROM: lumbar ROM limited by 50% due to her back issues   LOWER EXTREMITY ROM: Bilateral hips ROM is WNL     LOWER EXTREMITY MMT:  Bilateral hip strength is 4/5   + PALPATION:   General  Hernia on the upper abdominal after she had her gall bladder removed. Patient will bulge her abdomen instead of contraction.                  External Perineal Exam intact                             Internal Pelvic Floor tightness in the introitus   Patient confirms identification and approves PT to assess internal pelvic floor and treatment no ; patient prefers from now on to not have internal muscle assessment  PELVIC MMT:   MMT eval  Vaginal 1/5 with contracting the buttocks and  posterior pelvic tilt  (Blank rows = not tested)         TONE: low   PROLAPSE: Posterior wall weakness that comes to the introitus   TODAY'S TREATMENT:  06/17/22 Exercises: Stretches/mobility: Hamstring stretch bil. Supine Piriformis stretch holding 30 sec Trunk rotation Strengthening: Nustep level 1 for 5 minutes while assessing patient Supine hip circles with abdominal and pelvic floor contraction  Transverse abdominus with pelvic floor contraction and tactile cues to contract the abdominals correctly in supine Transverse abdominus with breath to contract holding for 5 sec in sitting All exercises gave patient verbal and tactile cues to engage the abdomen.    06/10/22 Neuromuscular re-education: Pelvic floor contraction  training: Contract in sitting for 10 sec 10x working on feeling the contraction and if she is substituting, she will touch her buttocks to make sure they are not contracting.  Supine knee fall out with pelvic floor contraction and abdominal bracing 2 x 10 Supine hip flexion isometric with pressing hand into thigh for 5 sec and contract the lower abdomen 2 x 10 Sitting ball squeeze holding for 5 sec 10x    05/10/2022 Neuromuscular re-education: Pelvic floor contraction training: Supine ball squeeze with pelvic floor and abdominal contraction  Supine ball squeeze with bridge but hurt her back Supine knee fall out with pelvic floor contraction and abdominal bracing Supine hip flexion isometric Therapeutic activities: Functional strengthening activities: Discussed with patient on vaginal health to reduce the irritation of the vaginal area. Educated patient on vaginal moisturizers.                                                                                                                                 PATIENT EDUCATION:  06/10/2022 Education details: Access Code: [email or web address],educated patient on vaginal moisturizers and vaginal health Person educated: Patient Education method: Explanation, Demonstration, Tactile cues, Verbal cues, and Handouts Education comprehension: verbalized understanding, returned demonstration, verbal cues required, tactile cues required, and needs further education   HOME EXERCISE PROGRAM: 06/10/2022 Access Code: [email or web address] URL: [email or web address] Date: 06/10/2022 Prepared by: Earlie Counts   Exercises - Supine Hip Adduction Isometric with Ball  - 3 x daily - 3 x weekly - 1 sets - 5 reps - 5 sec hold - Hooklying Isometric Hip Flexion  - 1 x daily - 3 x weekly - 1 sets - 10 reps - Bent Knee Fallouts  - 1 x daily - 3 x weekly - 3 sets - 10 reps - Seated Pelvic Floor Contraction  - 3 x daily - 7 x weekly - 1 sets - 10 reps - 10 sec hold - Seated Quick  Flick Pelvic Floor Contractions  - 3 x daily - 7 x weekly - 1 sets - 5 reps - Seated Hip Adduction Squeeze with Ball  - 1 x daily - 3 x weekly - 2 sets - 10 reps   ASSESSMENT:   CLINICAL IMPRESSION: Patient is a 82 y.o. female who was seen today for physical therapy  treatment for Mixed urinary incontinence.  Patient reports she is having less leakage. She is not running to the bathroom as much when she gets home. Patient is able to engage her abdomen correctly with exercise. Patient has learned other ways to engage her abdomen and pelvic floor with her back exercises. Patient will benefit from skilled therapy to improve pelvic  floor coordination and strength to reduce her leakage.    OBJECTIVE IMPAIRMENTS: decreased coordination, decreased endurance, decreased strength, and increased fascial restrictions.    ACTIVITY LIMITATIONS: standing, continence, toileting, and locomotion level   PARTICIPATION LIMITATIONS: shopping and community activity   PERSONAL FACTORS: Age, Fitness, and 1-2 comorbidities: Cholecystomy 10/10; Partial left knee arthroplasty 06/07/2017  are also affecting patient's functional outcome.    REHAB POTENTIAL: Good   CLINICAL DECISION MAKING: Stable/uncomplicated   EVALUATION COMPLEXITY: Low     GOALS: Goals reviewed with patient? Yes   SHORT TERM GOALS: Target date: 05/10/2022   Patient independent with initial exercises for pelvic floor strength.  Baseline: Goal status: Met 05/10/2022   2.  Patient educated on pelvic floor health and vaginal moisturizers.  Baseline:  Goal status: Met 05/10/2022   3.  Pelvic floor strength increased >/= 2/5 with a circular contraction and no substitutions.  Baseline:  Goal status: INITIAL       LONG TERM GOALS: Target date: 07/05/2021   Patient independent with advanced HEP for pelvic floor strength Baseline:  Goal status: INITIAL   2.  Patient pelvic strength is >/= 3/5 and reports her urinary leakage is </=  75% Baseline:  Goal status: INITIAL   3.  Patient is able to contract her abdominals without bulging and placing pressure on the pelvic floor.  Baseline:  Goal status: INITIAL   4.  Patient is able to walk to the bathroom without leaking urine due to increased pelvic floor coordination and strength.  Baseline:  Goal status: INITIAL     PLAN:   PT FREQUENCY: 1x/week   PT DURATION: 12 weeks   PLANNED INTERVENTIONS: Therapeutic exercises, Therapeutic activity, Neuromuscular re-education, Patient/Family education, Self Care, Biofeedback, and Manual therapy   PLAN FOR NEXT SESSION:  progress exercises, see if you can assess the pelvic floor strength, exercises in sitting or supine due to standing increases leg pain from her stenosis.    Earlie Counts, PT 06/17/22 12:30 PM

## 2022-06-24 ENCOUNTER — Ambulatory Visit: Payer: Medicare Other | Admitting: Physical Therapy

## 2022-06-24 ENCOUNTER — Encounter: Payer: Self-pay | Admitting: Physical Therapy

## 2022-06-24 DIAGNOSIS — R262 Difficulty in walking, not elsewhere classified: Secondary | ICD-10-CM

## 2022-06-24 DIAGNOSIS — R278 Other lack of coordination: Secondary | ICD-10-CM | POA: Diagnosis not present

## 2022-06-24 DIAGNOSIS — M6281 Muscle weakness (generalized): Secondary | ICD-10-CM

## 2022-06-24 NOTE — Therapy (Signed)
OUTPATIENT PHYSICAL THERAPY TREATMENT NOTE   Patient Name: Laura Carter MRN: [id number] DOB:10-Dec-1940, 82 y.o., female Today's Date: 06/24/2022  PCP: Caren Macadam, MD  REFERRING PROVIDER: Caren Macadam, MD   END OF SESSION:   PT End of Session - 06/24/22 1150     Visit Number 5    Date for PT Re-Evaluation 07/05/22    Authorization Type Medicare    Authorization - Visit Number 5    Authorization - Number of Visits 10    PT Start Time 7341    PT Stop Time 1225    PT Time Calculation (min) 40 min    Activity Tolerance Patient tolerated treatment well    Behavior During Therapy WFL for tasks assessed/performed             Past Medical History:  Diagnosis Date   Asthma    Diastolic dysfunction    GERD (gastroesophageal reflux disease)    History of hiatal hernia    Hypertension    Osteoarthritis of left knee    Past Surgical History:  Procedure Laterality Date   BREAST BIOPSY Left    years ago- no scar ? needle biopsy only   CHOLECYSTECTOMY  10/10   COLONOSCOPY     ESOPHAGOGASTRODUODENOSCOPY     EYE SURGERY     cataract right   KNEE ARTHROSCOPY WITH MEDIAL MENISECTOMY Left 06/16/2015   Procedure: KNEE ARTHROSCOPY WITH PARTIAL MEDIAL MENISECTOMY;  Surgeon: Carole Civil, MD;  Location: [facility];  Service: Orthopedics;  Laterality: Left;   PARTIAL KNEE ARTHROPLASTY Left 07/08/2017   Procedure: UNICOMPARTMENTAL KNEE;  Surgeon: Renette Butters, MD;  Location: [place];  Service: Orthopedics;  Laterality: Left;   Patient Active Problem List   Diagnosis Date Noted   Myofascial pain 02/27/2022   Leg length discrepancy 07/12/2020   Spinal stenosis, lumbar region, with neurogenic claudication 04/03/2020   Nerve pain 04/03/2020   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 I am doing my exercises for the stenosis and contract the pelvic floor. I am not peeing as often.   PAIN:  Are you having pain? No, pelvic pain     PRECAUTIONS: None   WEIGHT BEARING RESTRICTIONS: No   FALLS:  Has patient fallen in last 6 months? No   LIVING ENVIRONMENT: Lives with: lives with their family     OCCUPATION: retired, yoga 1 time per week, massage, [employer]   PLOF: Independent   PATIENT GOALS: stop peeing on herself   PERTINENT HISTORY:  Cholecystomy 10/10; Partial left knee arthroplasty 06/07/2017   BOWEL MOVEMENT: Pain with bowel movement: No Type of bowel movement:Strain Yes, sometimes external hemorrhoids will come out Fully empty rectum: Yes:   Leakage: No   URINATION: Pain with urination: No Fully empty bladder: Yes: nighttime does not fully empty due to a slow flow Stream:  during the day the urine flow is fine and night time is slow Urgency: Yes: starting to have this for the past few months, in the car and get out Frequency: day: morning a lot, afternoon slows down due to being busy Leakage: Urge to void, Walking to the bathroom, and does not always notice Pads: Yes: 1 light day; no pad a night   PREGNANCY: Vaginal deliveries 2 Tearing No   PROLAPSE: None    OBJECTIVE: (objective measures completed at initial evaluation unless otherwise dated)     DIAGNOSTIC FINDINGS:  none        COGNITION: Overall cognitive status: Within functional limits for tasks assessed                            GAIT: Assistive device utilized: Single point cane, when she wants to use it Level of assistance: Complete Independence Comments: limps on the right due to her lumbar stenosis   POSTURE: rounded shoulders, forward head, and decreased lumbar lordosis   PELVIC ALIGNMENT: ASIS are equal   LUMBARAROM/PROM: lumbar ROM limited by 50% due to her back issues   LOWER EXTREMITY ROM: Bilateral hips ROM is WNL     LOWER EXTREMITY MMT:  Bilateral hip strength is 4/5   + PALPATION:   General  Hernia on the upper abdominal after she had her gall bladder removed. Patient will bulge her abdomen instead of contraction.                  External Perineal Exam intact                             Internal Pelvic Floor tightness in the introitus   Patient confirms identification and approves PT to assess internal pelvic floor and treatment no ; patient prefers from now on to not have internal muscle assessment  PELVIC MMT:   MMT eval  Vaginal 1/5 with contracting the buttocks and  posterior pelvic tilt  (Blank rows = not tested)         TONE: low   PROLAPSE: Posterior wall weakness that comes to the introitus   TODAY'S TREATMENT:  05/28/22 Neuromuscular re-education: Pelvic floor contraction training: Pelvic floor EMG with surface electrodes to the vaginal area Contract for 10 sec supine without contracting the buttocks Working with pelvic floor isolation Supine march with pelvic floor contraction Quick flicks in supine Sit to stand  06/17/22 Exercises: Stretches/mobility: Hamstring stretch  bil. Supine Piriformis stretch holding 30 sec Trunk rotation Strengthening: Nustep level 1 for 5 minutes while assessing patient Supine hip circles with abdominal and pelvic floor contraction Transverse abdominus with pelvic floor contraction and tactile cues to contract the abdominals  correctly in supine Transverse abdominus with breath to contract holding for 5 sec in sitting All exercises gave patient verbal and tactile cues to engage the abdomen.     06/10/22 Neuromuscular re-education: Pelvic floor contraction training: Contract in sitting for 10 sec 10x working on feeling the contraction and if she is substituting, she will touch her buttocks to make sure they are not contracting.  Supine knee fall out with pelvic floor contraction and abdominal bracing 2 x 10 Supine hip flexion isometric with pressing hand into thigh for 5 sec and contract the lower abdomen 2 x 10 Sitting ball squeeze holding for 5 sec 10x                                                                                                                                   PATIENT EDUCATION:  06/10/2022 Education details: Access Code: [email or web address],educated patient on vaginal moisturizers and vaginal health Person educated: Patient Education method: Explanation, Demonstration, Tactile cues, Verbal cues, and Handouts Education comprehension: verbalized understanding, returned demonstration, verbal cues required, tactile cues required, and needs further education   HOME EXERCISE PROGRAM: 06/10/2022 Access Code: [email or web address] URL: [email or web address] Date: 06/10/2022 Prepared by: Earlie Counts   Exercises - Supine Hip Adduction Isometric with Ball  - 3 x daily - 3 x weekly - 1 sets - 5 reps - 5 sec hold - Hooklying Isometric Hip Flexion  - 1 x daily - 3 x weekly - 1 sets - 10 reps - Bent Knee Fallouts  - 1 x daily - 3 x weekly - 3 sets - 10 reps - Seated Pelvic Floor Contraction  - 3 x daily - 7 x weekly - 1 sets - 10 reps - 10 sec hold - Seated Quick Flick Pelvic Floor Contractions  - 3 x daily - 7 x weekly - 1 sets - 5 reps - Seated Hip Adduction Squeeze with Ball  - 1 x daily - 3 x weekly - 2 sets - 10 reps   ASSESSMENT:   CLINICAL IMPRESSION: Patient is a 82 y.o. female who was seen today  for physical therapy  treatment for Mixed urinary incontinence.  Patient is going to the bathroom less due to less urgency. She has difficulty isolating the pelvic floor due to trying to contract the buttocks too. Patient is able to contract the pelvic floor with sit to stand, supine exercises. She now understands what a pelvic floor contraction is. Resting level is 2uv. She is able to contract to 10 uv. Patient will benefit from skilled therapy to improve pelvic floor coordination and strength to reduce her leakage.    OBJECTIVE IMPAIRMENTS: decreased coordination, decreased endurance, decreased strength, and increased fascial  restrictions.    ACTIVITY LIMITATIONS: standing, continence, toileting, and locomotion level   PARTICIPATION LIMITATIONS: shopping and community activity   PERSONAL FACTORS: Age, Fitness, and 1-2 comorbidities: Cholecystomy 10/10; Partial left knee arthroplasty 06/07/2017  are also affecting patient's functional outcome.    REHAB POTENTIAL: Good   CLINICAL DECISION MAKING: Stable/uncomplicated   EVALUATION COMPLEXITY: Low     GOALS: Goals reviewed with patient? Yes   SHORT TERM GOALS: Target date: 05/10/2022   Patient independent with initial exercises for pelvic floor strength.  Baseline: Goal status: Met 05/10/2022   2.  Patient educated on pelvic floor health and vaginal moisturizers.  Baseline:  Goal status: Met 05/10/2022   3.  Pelvic floor strength increased >/= 2/5 with a circular contraction and no substitutions.  Baseline:  Goal status: INITIAL       LONG TERM GOALS: Target date: 07/05/2021   Patient independent with advanced HEP for pelvic floor strength Baseline:  Goal status: INITIAL   2.  Patient pelvic strength is >/= 3/5 and reports her urinary leakage is </= 75% Baseline:  Goal status: INITIAL   3.  Patient is able to contract her abdominals without bulging and placing pressure on the pelvic floor.  Baseline:  Goal status: INITIAL    4.  Patient is able to walk to the bathroom without leaking urine due to increased pelvic floor coordination and strength.  Baseline:  Goal status: INITIAL     PLAN:   PT FREQUENCY: 1x/week   PT DURATION: 12 weeks   PLANNED INTERVENTIONS: Therapeutic exercises, Therapeutic activity, Neuromuscular re-education, Patient/Family education, Self Care, Biofeedback, and Manual therapy   PLAN FOR NEXT SESSION: see if you can assess the pelvic floor strength, exercises in sitting or supine due to standing increases leg pain from her stenosis. See if she wants to do the pelvic floor EMG, write renewal.   Earlie Counts, PT 06/24/22 12:26 PM

## 2022-06-27 DIAGNOSIS — M5416 Radiculopathy, lumbar region: Secondary | ICD-10-CM | POA: Diagnosis not present

## 2022-07-01 ENCOUNTER — Ambulatory Visit: Payer: Medicare Other | Admitting: Physical Therapy

## 2022-07-01 DIAGNOSIS — H26493 Other secondary cataract, bilateral: Secondary | ICD-10-CM | POA: Diagnosis not present

## 2022-07-01 DIAGNOSIS — H02834 Dermatochalasis of left upper eyelid: Secondary | ICD-10-CM | POA: Diagnosis not present

## 2022-07-01 DIAGNOSIS — H5213 Myopia, bilateral: Secondary | ICD-10-CM | POA: Diagnosis not present

## 2022-07-01 DIAGNOSIS — H02831 Dermatochalasis of right upper eyelid: Secondary | ICD-10-CM | POA: Diagnosis not present

## 2022-07-19 DIAGNOSIS — M62830 Muscle spasm of back: Secondary | ICD-10-CM | POA: Diagnosis not present

## 2022-07-19 DIAGNOSIS — M9902 Segmental and somatic dysfunction of thoracic region: Secondary | ICD-10-CM | POA: Diagnosis not present

## 2022-07-19 DIAGNOSIS — M9903 Segmental and somatic dysfunction of lumbar region: Secondary | ICD-10-CM | POA: Diagnosis not present

## 2022-07-19 DIAGNOSIS — M9905 Segmental and somatic dysfunction of pelvic region: Secondary | ICD-10-CM | POA: Diagnosis not present

## 2022-07-30 DIAGNOSIS — M5416 Radiculopathy, lumbar region: Secondary | ICD-10-CM | POA: Diagnosis not present

## 2022-07-30 DIAGNOSIS — M48061 Spinal stenosis, lumbar region without neurogenic claudication: Secondary | ICD-10-CM | POA: Diagnosis not present

## 2022-08-02 DIAGNOSIS — M62830 Muscle spasm of back: Secondary | ICD-10-CM | POA: Diagnosis not present

## 2022-08-02 DIAGNOSIS — M9905 Segmental and somatic dysfunction of pelvic region: Secondary | ICD-10-CM | POA: Diagnosis not present

## 2022-08-02 DIAGNOSIS — R5383 Other fatigue: Secondary | ICD-10-CM | POA: Diagnosis not present

## 2022-08-02 DIAGNOSIS — M9903 Segmental and somatic dysfunction of lumbar region: Secondary | ICD-10-CM | POA: Diagnosis not present

## 2022-08-02 DIAGNOSIS — M9902 Segmental and somatic dysfunction of thoracic region: Secondary | ICD-10-CM | POA: Diagnosis not present

## 2022-08-07 ENCOUNTER — Encounter: Payer: Self-pay | Admitting: Physical Therapy

## 2022-08-07 ENCOUNTER — Ambulatory Visit: Payer: Medicare Other | Attending: Family Medicine | Admitting: Physical Therapy

## 2022-08-07 DIAGNOSIS — R278 Other lack of coordination: Secondary | ICD-10-CM | POA: Insufficient documentation

## 2022-08-07 DIAGNOSIS — R262 Difficulty in walking, not elsewhere classified: Secondary | ICD-10-CM | POA: Insufficient documentation

## 2022-08-07 DIAGNOSIS — M6281 Muscle weakness (generalized): Secondary | ICD-10-CM | POA: Diagnosis not present

## 2022-08-07 NOTE — Therapy (Signed)
OUTPATIENT PHYSICAL THERAPY TREATMENT NOTE   Patient Name: Laura Carter MRN: [id number] DOB:10/19/1940, 82 y.o., female Today's Date: 08/07/2022  PCP: Caren Macadam, MD  REFERRING PROVIDER:Hagler, Apolonio Schneiders, MD    END OF SESSION:   PT End of Session - 08/07/22 1450     Visit Number 6    Date for PT Re-Evaluation 09/27/22    Authorization Type Medicare    Authorization - Visit Number 6    Authorization - Number of Visits 10    PT Start Time [id number]    PT Stop Time [id number]    PT Time Calculation (min) 40 min    Activity Tolerance Patient tolerated treatment well    Behavior During Therapy WFL for tasks assessed/performed             Past Medical History:  Diagnosis Date   Asthma    Diastolic dysfunction    GERD (gastroesophageal reflux disease)    History of hiatal hernia    Hypertension    Osteoarthritis of left knee    Past Surgical History:  Procedure Laterality Date   BREAST BIOPSY Left    years ago- no scar ? needle biopsy only   CHOLECYSTECTOMY  10/10   COLONOSCOPY     ESOPHAGOGASTRODUODENOSCOPY     EYE SURGERY     cataract right   KNEE ARTHROSCOPY WITH MEDIAL MENISECTOMY Left 06/16/2015   Procedure: KNEE ARTHROSCOPY WITH PARTIAL MEDIAL MENISECTOMY;  Surgeon: Carole Civil, MD;  Location: [facility];  Service: Orthopedics;  Laterality: Left;   PARTIAL KNEE ARTHROPLASTY Left 07/08/2017   Procedure: UNICOMPARTMENTAL KNEE;  Surgeon: Renette Butters, MD;  Location: [place];  Service: Orthopedics;  Laterality: Left;   Patient Active Problem List   Diagnosis Date Noted   Myofascial pain 02/27/2022   Leg length discrepancy 07/12/2020   Spinal stenosis, lumbar region, with neurogenic claudication 04/03/2020   Nerve pain 04/03/2020   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 I held off from coming to therapy for the pelvic floor to see how the steroid injections help me. I am discouraged about the leg pain coming back. I have flooded my pad several times when getting out of my car.     PAIN:  Are you having pain? No, pelvic pain     PRECAUTIONS: None   WEIGHT BEARING RESTRICTIONS: No   FALLS:  Has patient fallen in last 6 months? No   LIVING ENVIRONMENT: Lives with: lives with their family     OCCUPATION: retired, yoga 1 time per week, massage, [employer]   PLOF: Independent   PATIENT GOALS: stop peeing on herself   PERTINENT HISTORY:  Cholecystomy 10/10; Partial left knee arthroplasty 06/07/2017   BOWEL MOVEMENT: Pain with bowel movement: No Type of bowel movement:Strain Yes, sometimes external hemorrhoids will come out Fully empty rectum: Yes:   Leakage: No   URINATION: Pain with urination: No Fully empty bladder: Yes: sometimes will dribble urine after urinating Stream:  during the day the urine flow is fine and night time is slow Urgency: Yes: starting to have this for the past few months, in the car and get out Frequency: day: morning a lot, afternoon slows down due to being busy Leakage: Urge to void, Walking to the bathroom, and does not always notice, getting out of the car Pads: Yes: 1 light day; no pad a night   PREGNANCY: Vaginal deliveries 2 Tearing  No   PROLAPSE: None    OBJECTIVE: (objective measures completed at initial evaluation unless otherwise dated)     DIAGNOSTIC FINDINGS:  none       COGNITION: Overall cognitive status: Within functional limits for tasks assessed                            GAIT: Assistive device utilized: Single point cane, when she wants to use it Level of assistance: Complete Independence Comments: limps on the right due to her lumbar stenosis   POSTURE: rounded shoulders, forward head, and decreased lumbar lordosis   PELVIC ALIGNMENT: ASIS are equal   LUMBARAROM/PROM: lumbar ROM limited by 50% due to her back issues   LOWER EXTREMITY ROM: Bilateral hips ROM is WNL     LOWER EXTREMITY MMT:  Bilateral hip strength is 4/5  08/07/22 bilateral hip strength is 4/5  PALPATION:   General  Hernia on the upper abdominal after she had her gall bladder removed. Patient will bulge her abdomen instead of contraction.                  External Perineal Exam intact                             Internal Pelvic Floor tightness in the introitus   Patient confirms identification and approves PT to assess internal pelvic floor and treatment no ; patient prefers from now on to not have internal muscle assessment  PELVIC MMT:   MMT eval 08/07/22  Vaginal 1/5 with contracting the buttocks and  posterior pelvic tilt 2/5 with a lot of tactile cues and verbal cues to not contract the gluteals  (Blank rows = not tested)         TONE: low   PROLAPSE: Posterior wall weakness that comes to the introitus   TODAY'S TREATMENT:  08/07/22 Manual: Internal pelvic floor techniques: No emotional/communication barriers or cognitive limitation. Patient is motivated to learn. Patient understands and agrees with treatment goals and plan. PT explains patient will be examined in standing, sitting, and lying down to see how their muscles and joints work. When they are ready, they will be asked to remove their underwear so  PT can examine their perineum. The patient is also given the option of providing their own chaperone as one is not provided in our facility. The patient also has the right and is explained the right to defer or refuse any part of the evaluation or treatment including the internal exam. With the patient's consent, PT will use one gloved finger to gently assess the muscles of the pelvic floor, seeing how well it contracts and relaxes and if there is muscle symmetry. After, the patient will get dressed and PT and patient will discuss exam findings and plan of care. PT and patient discuss plan of care, schedule, attendance policy and HEP activities.  Going through the vaginal working the introitus and posterior vaginal canal and perineal body to lengthen the tissue Exercises: Strengthening: Nustep level 5 for 6 minutes while assessing patient Therapist finger in the vaginal canal giving tactile cues to contract the pelvic floor and lower abdominals. Patient needs verbal cues to not contract the gluteals. She has difficulty with feeling the contraction.   05/28/22 Neuromuscular re-education: Pelvic floor contraction training: Pelvic floor EMG with surface electrodes to the vaginal area Contract for 10 sec supine without contracting the buttocks Working with pelvic floor isolation Supine march with pelvic floor contraction Quick flicks in supine Sit to stand  06/17/22 Exercises: Stretches/mobility: Hamstring stretch bil. Supine Piriformis stretch holding 30 sec Trunk rotation Strengthening: Nustep level 1 for 5 minutes while assessing patient Supine hip circles with abdominal and pelvic floor contraction Transverse abdominus with pelvic floor contraction and tactile cues to contract the abdominals correctly in supine Transverse abdominus with breath to contract holding for 5 sec in sitting All exercises gave patient verbal and tactile cues to engage the abdomen.     06/10/22 Neuromuscular  re-education: Pelvic floor contraction training: Contract in sitting for 10 sec 10x working on feeling the contraction and if she is substituting, she will touch her buttocks to make sure they are not contracting.  Supine knee fall out with pelvic floor contraction and abdominal bracing 2 x 10 Supine hip flexion isometric with pressing hand into thigh for 5 sec and contract the lower abdomen 2 x 10 Sitting ball squeeze holding for 5 sec 10x                                                                                                                                   PATIENT EDUCATION:  06/10/2022 Education details: Access Code: [email or web address],educated patient on vaginal moisturizers and vaginal health Person educated: Patient Education method: Explanation, Demonstration, Tactile cues, Verbal cues, and Handouts  Education comprehension: verbalized understanding, returned demonstration, verbal cues required, tactile cues required, and needs further education   HOME EXERCISE PROGRAM: 06/10/2022 Access Code: [email or web address] URL: [email or web address] Date: 06/10/2022 Prepared by: Earlie Counts   Exercises - Supine Hip Adduction Isometric with Ball  - 3 x daily - 3 x weekly - 1 sets - 5 reps - 5 sec hold - Hooklying Isometric Hip Flexion  - 1 x daily - 3 x weekly - 1 sets - 10 reps - Bent Knee Fallouts  - 1 x daily - 3 x weekly - 3 sets - 10 reps - Seated Pelvic Floor Contraction  - 3 x daily - 7 x weekly - 1 sets - 10 reps - 10 sec hold - Seated Quick Flick Pelvic Floor Contractions  - 3 x daily - 7 x weekly - 1 sets - 5 reps - Seated Hip Adduction Squeeze with Ball  - 1 x daily - 3 x weekly - 2 sets - 10 reps   ASSESSMENT:   CLINICAL IMPRESSION: Patient is a 82 y.o. female who was seen today for physical therapy  treatment for Mixed urinary incontinence. Patient has not attended therapy for the past month due to having injections for her spinal stenosis and wanted to recover from that.  She continues to have urinary leakage and at times can soak a pad.  Patient is going to the bathroom less due to less urgency. She has difficulty isolating the pelvic floor due to trying to contract the buttocks too. Patient is able to contract the pelvic floor with sit to stand, supine exercises. She has trouble feeling the pelvic floor contraction and will use the gluteals to substitute. Marland Kitchen Resting level is 2uv. She is able to contract to 10 uv. Pelvic floor strength is 2/5 compared to 1/5. Patient will benefit from skilled therapy to improve pelvic floor coordination and strength to reduce her leakage.    OBJECTIVE IMPAIRMENTS: decreased coordination, decreased endurance, decreased strength, and increased fascial restrictions.    ACTIVITY LIMITATIONS: standing, continence, toileting, and locomotion level   PARTICIPATION LIMITATIONS: shopping and community activity   PERSONAL FACTORS: Age, Fitness, and 1-2 comorbidities: Cholecystomy 10/10; Partial left knee arthroplasty 06/07/2017  are also affecting patient's functional outcome.    REHAB POTENTIAL: Good   CLINICAL DECISION MAKING: Stable/uncomplicated   EVALUATION COMPLEXITY: Low     GOALS: Goals reviewed with patient? Yes   SHORT TERM GOALS: Target date: 05/10/2022   Patient independent with initial exercises for pelvic floor strength.  Baseline: Goal status: Met 05/10/2022   2.  Patient educated on pelvic floor health and vaginal moisturizers.  Baseline:  Goal status: Met 05/10/2022   3.  Pelvic floor strength increased >/= 2/5 with a circular contraction and no substitutions.  Baseline:  Goal status: ongoing 08/07/22      LONG TERM GOALS: Target date: 09/27/22   Patient independent with advanced HEP for pelvic floor strength Baseline:  Goal status: ongoing 08/07/22   2.  Patient pelvic strength is >/= 3/5 and reports her urinary leakage is </= 75% Baseline:  Goal status: ongoing 08/07/22   3.  Patient is able to contract  her abdominals without bulging and placing pressure on the pelvic floor.  Baseline:  Goal status: ongoing 08/07/22   4.  Patient is able to walk to the bathroom without leaking urine due to increased pelvic floor coordination and strength.  Baseline:  Goal status: ongoing 08/07/22     PLAN:   PT FREQUENCY: 1x/week  PT DURATION: 12 weeks   PLANNED INTERVENTIONS: Therapeutic exercises, Therapeutic activity, Neuromuscular re-education, Patient/Family education, Self Care, Biofeedback, and Manual therapy   PLAN FOR NEXT SESSION: manual work on pelvic floor to get a contraction and elongate the tissue   Earlie Counts, PT 08/07/22 3:26 PM

## 2022-08-08 DIAGNOSIS — Z5189 Encounter for other specified aftercare: Secondary | ICD-10-CM | POA: Diagnosis not present

## 2022-08-08 DIAGNOSIS — Z85828 Personal history of other malignant neoplasm of skin: Secondary | ICD-10-CM | POA: Diagnosis not present

## 2022-08-14 ENCOUNTER — Encounter: Payer: Self-pay | Admitting: Physical Therapy

## 2022-08-14 ENCOUNTER — Ambulatory Visit: Payer: Medicare Other | Admitting: Physical Therapy

## 2022-08-14 DIAGNOSIS — R278 Other lack of coordination: Secondary | ICD-10-CM

## 2022-08-14 DIAGNOSIS — M6281 Muscle weakness (generalized): Secondary | ICD-10-CM

## 2022-08-14 DIAGNOSIS — R262 Difficulty in walking, not elsewhere classified: Secondary | ICD-10-CM | POA: Diagnosis not present

## 2022-08-14 NOTE — Therapy (Addendum)
OUTPATIENT PHYSICAL THERAPY TREATMENT NOTE   Patient Name: Laura Carter MRN: [id number] DOB:1941-02-17, 82 y.o., female Today's Date: 08/14/2022  PCP: Caren Macadam, MD  REFERRING PROVIDER: Caren Macadam, MD   END OF SESSION:   PT End of Session - 08/14/22 1446     Visit Number 7    Date for PT Re-Evaluation 09/27/22    Authorization Type Medicare    Authorization - Visit Number 7    Authorization - Number of Visits 10    PT Start Time [id number]    PT Stop Time [id number]    PT Time Calculation (min) 40 min    Activity Tolerance Patient tolerated treatment well    Behavior During Therapy WFL for tasks assessed/performed             Past Medical History:  Diagnosis Date   Asthma    Diastolic dysfunction    GERD (gastroesophageal reflux disease)    History of hiatal hernia    Hypertension    Osteoarthritis of left knee    Past Surgical History:  Procedure Laterality Date   BREAST BIOPSY Left    years ago- no scar ? needle biopsy only   CHOLECYSTECTOMY  10/10   COLONOSCOPY     ESOPHAGOGASTRODUODENOSCOPY     EYE SURGERY     cataract right   KNEE ARTHROSCOPY WITH MEDIAL MENISECTOMY Left 06/16/2015   Procedure: KNEE ARTHROSCOPY WITH PARTIAL MEDIAL MENISECTOMY;  Surgeon: Carole Civil, MD;  Location: [facility];  Service: Orthopedics;  Laterality: Left;   PARTIAL KNEE ARTHROPLASTY Left 07/08/2017   Procedure: UNICOMPARTMENTAL KNEE;  Surgeon: Renette Butters, MD;  Location: [place];  Service: Orthopedics;  Laterality: Left;   Patient Active Problem List   Diagnosis Date Noted   Myofascial pain 02/27/2022   Leg length discrepancy 07/12/2020   Spinal stenosis, lumbar region, with neurogenic claudication 04/03/2020   Nerve pain 04/03/2020   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       PAIN:  Are you having pain? No, pelvic pain     PRECAUTIONS: None   WEIGHT BEARING RESTRICTIONS: No   FALLS:  Has patient fallen in last 6 months? No   LIVING ENVIRONMENT: Lives with: lives with their family     OCCUPATION: retired, yoga 1 time per week, massage, [employer]   PLOF: Independent   PATIENT GOALS: stop peeing on herself   PERTINENT HISTORY:  Cholecystomy 10/10; Partial left knee arthroplasty 06/07/2017   BOWEL MOVEMENT: Pain with bowel movement: No Type of bowel movement:Strain Yes, sometimes external hemorrhoids will come out Fully empty rectum: Yes:   Leakage: No   URINATION: Pain with urination: No Fully empty bladder: Yes: sometimes will dribble urine after urinating Stream:  during the day the urine flow is fine and night time is slow Urgency: Yes: starting to have this for the past few months, in the car and get out Frequency: day: morning a lot, afternoon slows down due to being busy Leakage: Urge to void, Walking to the bathroom, and does not always notice, getting out of the car Pads: Yes: 1 light day; no pad a night   PREGNANCY: Vaginal deliveries 2 Tearing No   PROLAPSE: None    OBJECTIVE: (objective measures completed at initial evaluation unless otherwise dated)     DIAGNOSTIC FINDINGS:  none       COGNITION: Overall cognitive status: Within functional limits for tasks assessed                             GAIT: Assistive device utilized: Single point cane, when she wants to use it Level of assistance: Complete Independence Comments: limps on the right due to her lumbar stenosis   POSTURE: rounded shoulders, forward head, and decreased lumbar lordosis   PELVIC ALIGNMENT: ASIS are equal   LUMBARAROM/PROM: lumbar ROM limited by 50% due to her back issues   LOWER EXTREMITY ROM: Bilateral hips ROM is WNL     LOWER EXTREMITY MMT:  Bilateral hip strength is 4/5  08/07/22 bilateral hip strength is 4/5   PALPATION:   General  Hernia on the upper abdominal after she had her gall bladder removed. Patient will bulge her abdomen instead of contraction.                  External Perineal Exam intact                             Internal Pelvic Floor tightness in the introitus   Patient confirms identification and approves PT to assess internal pelvic floor and treatment no ; patient prefers from now on to not have internal muscle assessment  PELVIC MMT:   MMT eval 08/07/22  Vaginal 1/5 with contracting the buttocks and  posterior pelvic tilt 2/5 with a lot of tactile cues and verbal cues to not contract the gluteals  (Blank rows = not tested)         TONE: low   PROLAPSE: Posterior wall weakness that comes to the introitus   TODAY'S TREATMENT:  08/14/22 Exercises: Strengthening: Nustep level 5 for 6 minutes while assessing patient Sitting on ball to contract the pelvic floor and lift the vaginal off the wall, pelvic circles, pelvic sway to engage the pelvic floor.  Sitting with hip abduction and  blue band around knees and hold yellow band at shoulder height Sitting ball squeeze moving arm up and down with yellow band around wrist Sitting with ball squeeze with moving arms side to side with yellow band around wrists  08/07/22 Manual: Internal pelvic floor techniques: No emotional/communication barriers or cognitive limitation. Patient is motivated to learn. Patient  understands and agrees with treatment goals and plan. PT explains patient will be examined in standing, sitting, and lying down to see how their muscles and joints work. When they are ready, they will be asked to remove their underwear so PT can examine their perineum. The patient is also given the option of providing their own chaperone as one is not provided in our facility. The patient also has the right and is explained the right to defer or refuse any part of the evaluation or treatment including the internal exam. With the patient's consent, PT will use one gloved finger to gently assess the muscles of the pelvic floor, seeing how well it contracts and relaxes and if there is muscle symmetry. After, the patient will get dressed and PT and patient will discuss exam findings and plan of care. PT and patient discuss plan of care, schedule, attendance policy and HEP activities.  Going through the vaginal working the introitus and posterior vaginal canal and perineal body to lengthen the tissue Exercises: Strengthening: Nustep level 5 for 6 minutes while assessing patient Therapist finger in the vaginal canal giving tactile cues to contract the pelvic floor and lower abdominals. Patient needs verbal cues to not contract the gluteals. She has difficulty with feeling the contraction.    05/28/22 Neuromuscular re-education: Pelvic floor contraction training: Pelvic floor EMG with surface electrodes to the vaginal area Contract for 10 sec supine without contracting the buttocks Working with pelvic floor isolation Supine march with pelvic floor contraction Quick flicks in supine Sit to stand                                                                                                                                 PATIENT EDUCATION:  08/14/2022 Education details: Access Code: [email or web address],educated patient on vaginal moisturizers and vaginal health Person educated: Patient Education method: Explanation,  Demonstration, Tactile cues, Verbal cues, and Handouts Education comprehension: verbalized understanding, returned demonstration, verbal cues required, tactile cues required, and needs further education   HOME EXERCISE PROGRAM: 08/14/2022 Access Code: [email or web address] URL: [email or web address] Date: 08/14/2022 Prepared by: Earlie Counts  Exercises -- Seated Quick Flick Pelvic Floor Contractions  - 3 x daily - 7 x weekly - 1 sets - 5 reps - Seated Hip Adduction Squeeze with Ball  - 1 x daily - 3 x weekly - 2 sets - 10 reps - Seated Pelvic Floor Contraction with Hip Abduction and Resistance Loop  - 1 x daily - 3 x weekly - 3 sets - 10 reps - Seated March with Resistance  - 1 x daily - 3 x  weekly - 3 sets - 10 reps - Seated Hip Adduction Isometrics with Ball  - 1 x daily - 3 x weekly - 2 sets - 10 reps   ASSESSMENT:   CLINICAL IMPRESSION: Patient is a 82 y.o. female who was seen today for physical therapy  treatment for Mixed urinary incontinence.  Patient able to feel more f the pelvic floor contraction with sitting on the ball. She was able to feel more lower abdominal contraction with the pelvic floor contraction with the exercises she did today. Patient will benefit from skilled therapy to improve pelvic floor coordination and strength to reduce her leakage.    OBJECTIVE IMPAIRMENTS: decreased coordination, decreased endurance, decreased strength, and increased fascial restrictions.    ACTIVITY LIMITATIONS: standing, continence, toileting, and locomotion level   PARTICIPATION LIMITATIONS: shopping and community activity   PERSONAL FACTORS: Age, Fitness, and 1-2 comorbidities: Cholecystomy 10/10; Partial left knee arthroplasty 06/07/2017  are also affecting patient's functional outcome.    REHAB POTENTIAL: Good   CLINICAL DECISION MAKING: Stable/uncomplicated   EVALUATION COMPLEXITY: Low     GOALS: Goals reviewed with patient? Yes   SHORT TERM GOALS: Target date:  05/10/2022   Patient independent with initial exercises for pelvic floor strength.  Baseline: Goal status: Met 05/10/2022   2.  Patient educated on pelvic floor health and vaginal moisturizers.  Baseline:  Goal status: Met 05/10/2022   3.  Pelvic floor strength increased >/= 2/5 with a circular contraction and no substitutions.  Baseline:  Goal status: ongoing 08/07/22      LONG TERM GOALS: Target date: 09/27/22   Patient independent with advanced HEP for pelvic floor strength Baseline:  Goal status: ongoing 08/07/22   2.  Patient pelvic strength is >/= 3/5 and reports her urinary leakage is </= 75% Baseline:  Goal status: ongoing 08/07/22   3.  Patient is able to contract her abdominals without bulging and placing pressure on the pelvic floor.  Baseline:  Goal status: ongoing 08/07/22   4.  Patient is able to walk to the bathroom without leaking urine due to increased pelvic floor coordination and strength.  Baseline:  Goal status: ongoing 08/07/22     PLAN:   PT FREQUENCY: 1x/week   PT DURATION: 12 weeks   PLANNED INTERVENTIONS: Therapeutic exercises, Therapeutic activity, Neuromuscular re-education, Patient/Family education, Self Care, Biofeedback, and Manual therapy   PLAN FOR NEXT SESSION: manual work on pelvic floor to get a contraction and elongate the tissue , exercises to engage the pelvic floor in sitting    Eulis Foster, PT 08/14/22 3:32 PM  PHYSICAL THERAPY DISCHARGE SUMMARY  Visits from Start of Care: 7  Current functional level related to goals / functional outcomes: See above.    Remaining deficits: See above.    Education / Equipment: HEP   Patient agrees to discharge. Patient goals were not met. Patient is being discharged due to the patient's request. Thank you for the referral. Eulis Foster, PT 09/24/22 4:13 PM

## 2022-08-16 DIAGNOSIS — M9902 Segmental and somatic dysfunction of thoracic region: Secondary | ICD-10-CM | POA: Diagnosis not present

## 2022-08-16 DIAGNOSIS — M62830 Muscle spasm of back: Secondary | ICD-10-CM | POA: Diagnosis not present

## 2022-08-16 DIAGNOSIS — M9905 Segmental and somatic dysfunction of pelvic region: Secondary | ICD-10-CM | POA: Diagnosis not present

## 2022-08-16 DIAGNOSIS — M9903 Segmental and somatic dysfunction of lumbar region: Secondary | ICD-10-CM | POA: Diagnosis not present

## 2022-08-21 ENCOUNTER — Ambulatory Visit: Payer: Medicare Other | Admitting: Physical Therapy

## 2022-08-22 DIAGNOSIS — M9903 Segmental and somatic dysfunction of lumbar region: Secondary | ICD-10-CM | POA: Diagnosis not present

## 2022-08-22 DIAGNOSIS — M62830 Muscle spasm of back: Secondary | ICD-10-CM | POA: Diagnosis not present

## 2022-08-22 DIAGNOSIS — M9905 Segmental and somatic dysfunction of pelvic region: Secondary | ICD-10-CM | POA: Diagnosis not present

## 2022-08-22 DIAGNOSIS — M9902 Segmental and somatic dysfunction of thoracic region: Secondary | ICD-10-CM | POA: Diagnosis not present

## 2022-08-28 ENCOUNTER — Ambulatory Visit: Payer: Medicare Other | Admitting: Physical Therapy

## 2022-08-30 DIAGNOSIS — M9902 Segmental and somatic dysfunction of thoracic region: Secondary | ICD-10-CM | POA: Diagnosis not present

## 2022-08-30 DIAGNOSIS — M62830 Muscle spasm of back: Secondary | ICD-10-CM | POA: Diagnosis not present

## 2022-08-30 DIAGNOSIS — M9903 Segmental and somatic dysfunction of lumbar region: Secondary | ICD-10-CM | POA: Diagnosis not present

## 2022-08-30 DIAGNOSIS — M9905 Segmental and somatic dysfunction of pelvic region: Secondary | ICD-10-CM | POA: Diagnosis not present

## 2022-09-04 ENCOUNTER — Encounter: Payer: Medicare Other | Admitting: Physical Therapy

## 2022-09-05 ENCOUNTER — Ambulatory Visit
Admission: EM | Admit: 2022-09-05 | Discharge: 2022-09-05 | Disposition: A | Discharge status: Home / Self Care | Payer: Medicare Other | Attending: Nurse Practitioner | Admitting: Nurse Practitioner

## 2022-09-05 ENCOUNTER — Encounter: Payer: Self-pay | Admitting: Emergency Medicine

## 2022-09-05 DIAGNOSIS — J069 Acute upper respiratory infection, unspecified: Secondary | ICD-10-CM | POA: Diagnosis not present

## 2022-09-05 DIAGNOSIS — Z1152 Encounter for screening for COVID-19: Secondary | ICD-10-CM | POA: Diagnosis not present

## 2022-09-05 NOTE — Discharge Instructions (Signed)
You have a viral upper respiratory infection.  Symptoms should improve over the next week to 10 days.  If you develop chest pain or shortness of breath, go to the emergency room.  We have tested you today for COVID-19.  You will see the results in Mychart and we will call you with positive results.  Please stay home and isolate until you are aware of the results.    Some things that can make you feel better are: - Increased rest - Increasing fluid with water/sugar free electrolytes - Acetaminophen and ibuprofen as needed for fever/pain - Salt water gargling, chloraseptic spray and throat lozenges - OTC guaifenesin (Mucinex) 600 mg twice daily - Saline sinus flushes or a neti pot - Humidifying the air 

## 2022-09-05 NOTE — ED Triage Notes (Signed)
Has been feeling sleepy for the past few days.  States she feels fatigued.  States has been sneezing, with some congestion.  States has to use her inhaler a few nights ago and rarely has to do that.  States she is scheduled for a sleep apnea test on 4/18

## 2022-09-05 NOTE — ED Provider Notes (Signed)
[facility]    CSN: [id number] Arrival date & time: 09/05/22  [id number]      History   Chief Complaint No chief complaint on file.   HPI Laura Carter is a 82 y.o. female.   Patient presents today with several day history of fatigue, occasional feeling flushed, congested cough that is worse in the morning, shortness of breath and wheezing, chest congestion and nasal congestion, hoarseness, postnasal drainage, diarrhea.  Patient denies fever, body aches or chills, chest pain or tightness, runny nose, sore throat, headache, ear pain, abdominal pain, nausea/vomiting, decreased appetite, loss of taste or smell, and new rash.  Reports she began taking probiotics when the diarrhea began.  Has also been using albuterol inhaler more than normal which does seem to help with the wheezing and shortness of breath.  No known sick contacts.    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 needle biopsy only   CHOLECYSTECTOMY  10/10   COLONOSCOPY     ESOPHAGOGASTRODUODENOSCOPY     EYE SURGERY     cataract right   KNEE ARTHROSCOPY WITH MEDIAL MENISECTOMY Left 06/16/2015   Procedure: KNEE ARTHROSCOPY WITH PARTIAL  MEDIAL MENISECTOMY;  Surgeon: Vickki HearingStanley E Harrison, MD;  Location: [facility];  Service: Orthopedics;  Laterality: Left;   PARTIAL KNEE ARTHROPLASTY Left 07/08/2017   Procedure: UNICOMPARTMENTAL KNEE;  Surgeon: Sheral ApleyMurphy, Timothy D, MD;  [facility];  Service: Orthopedics;  Laterality: Left;    OB History   No obstetric history on file.      Home Medications    Prior to Admission medications   Medication Sig Start Date End Date Taking? Authorizing Provider  ADVAIR DISKUS 100-50 MCG/ACT AEPB 1 puff 2 (two) times daily. 05/04/21   [provider]  albuterol (VENTOLIN HFA) 108 (90 Base) MCG/ACT inhaler Inhale 1-2 puffs into the lungs every 6 (six) hours as needed for wheezing or shortness of breath. 05/23/21   Wallis BambergMani, Mario, PA-C  aspirin EC 81 MG tablet Take 1 tablet (81 mg total) by mouth daily. 11/18/17   Aliene BeamsHagler, Rachel, MD  Cholecalciferol (VITAMIN D3 PO) Take 1 tablet by mouth daily. 2000 units Patient not taking: Reported on 11/05/2021    [provider]  Cyanocobalamin (VITAMIN B-12 PO) Take 1 tablet by mouth daily.    [provider]  esomeprazole (NEXIUM) 20 MG capsule Take 20 mg by mouth daily at 12 noon.    [provider]  glucosamine-chondroitin 500-400 MG tablet Take 1 tablet by mouth 3 (three) times daily. Patient not taking: Reported on 11/05/2021    [provider]  metoprolol succinate (TOPROL-XL) 25 MG 24 hr tablet  TAKE (1) TABLET BY MOUTH ONCE DAILY. 06/30/17   Donita Brooks, MD  Multiple Vitamins-Minerals (ZINC PO) Take 1 tablet by mouth daily as needed (for immune health/onset of cold). Patient not taking: Reported on 11/05/2021    [provider]  pregabalin (LYRICA) 25 MG capsule Take 1 capsule (25 mg total) by mouth 2 (two) times daily. - for nerve pain 02/27/22   Lovorn, Aundra Millet, MD  Turmeric 500 MG CAPS Take by mouth. Patient not taking: Reported on 11/05/2021    [provider]    Family History Family History   Problem Relation Age of Onset   Cancer Mother        breast   Stomach cancer Mother    Breast cancer Mother    Heart disease Father    Heart disease Sister    Breast cancer Sister     Social History Social History   Tobacco Use   Smoking status: Former    Packs/day: 1.00    Years: 10.00    Additional pack years: 0.00    Total pack years: 10.00    Types: Cigarettes    Quit date: 06/13/1965    Years since quitting: 57.2   Smokeless tobacco: Never  Vaping Use   Vaping Use: Never used  Substance Use Topics   Alcohol use: No   Drug use: No     Allergies   Patient has no known allergies.   Review of Systems Review of Systems Per HPI  Physical Exam Triage Vital Signs ED Triage Vitals  Enc Vitals Group     BP 09/05/22 0847 (!) 155/85     Pulse Rate 09/05/22 0847 91     Resp 09/05/22 0847 18     Temp 09/05/22 0847 98.2 F (36.8 C)     Temp Source 09/05/22 0847 Oral     SpO2 09/05/22 0847 94 %     Weight --      Height --      Head Circumference --      Peak Flow --      Pain Score 09/05/22 0848 0     Pain Loc --      Pain Edu? --      Excl. in GC? --    No data found.  Updated Vital Signs BP (!) 155/85 (BP Location: Right Arm)   Pulse 91   Temp 98.2 F (36.8 C) (Oral)   Resp 18   SpO2 94%   Visual Acuity Right Eye Distance:   Left Eye Distance:   Bilateral Distance:    Right Eye Near:   Left Eye Near:    Bilateral Near:     Physical Exam Vitals and nursing note reviewed.  Constitutional:      General: She is not in acute distress.    Appearance: Normal appearance. She is not ill-appearing or toxic-appearing.  HENT:     Head: Normocephalic and atraumatic.     Right Ear: Ear canal and external ear normal. A middle ear effusion is present.     Left Ear: Ear canal and external ear normal. A middle ear effusion is present.     Nose: No congestion or rhinorrhea.     Mouth/Throat:     Mouth: Mucous membranes are moist.     Pharynx: Oropharynx  is clear. Posterior oropharyngeal erythema present. No oropharyngeal exudate.  Eyes:     General: No scleral icterus.    Extraocular Movements: Extraocular movements intact.  Cardiovascular:  Rate and Rhythm: Normal rate and regular rhythm.  Pulmonary:     Effort: Pulmonary effort is normal. No respiratory distress.     Breath sounds: Normal breath sounds. No wheezing, rhonchi or rales.  Abdominal:     General: Abdomen is flat. Bowel sounds are normal. There is no distension.     Palpations: Abdomen is soft.     Tenderness: There is no abdominal tenderness.  Musculoskeletal:     Cervical back: Normal range of motion and neck supple.  Lymphadenopathy:     Cervical: No cervical adenopathy.  Skin:    General: Skin is warm and dry.     Coloration: Skin is not jaundiced or pale.     Findings: No erythema or rash.  Neurological:     Mental Status: She is alert and oriented to person, place, and time.     Motor: No weakness.      UC Treatments / Results  Labs (all labs ordered are listed, but only abnormal results are displayed) Labs Reviewed  SARS CORONAVIRUS 2 (TAT 6-24 HRS)    EKG   Radiology No results found.  Procedures Procedures (including critical care time)  Medications Ordered in UC Medications - No data to display  Initial Impression / Assessment and Plan / UC Course  I have reviewed the triage vital signs and the nursing notes.  Pertinent labs & imaging results that were available during my care of the patient were reviewed by me and considered in my medical decision making (see chart for details).   Patient is well-appearing, normotensive, afebrile, not tachycardic, not tachypneic, oxygenating well on room air.    1. Viral URI with cough 2. Encounter for screening for COVID-19 Suspect viral etiology Vital signs and examination today are reassuring COVID-19 test is pending Supportive care discussed with patient Recommended starting oral  antihistamine and Flonase, Mucinex, continue albuterol as needed for wheezing or shortness of breath Strict ER precautions discussed with patient  The patient was given the opportunity to ask questions.  All questions answered to their satisfaction.  The patient is in agreement to this plan.    Final Clinical Impressions(s) / UC Diagnoses   Final diagnoses:  Viral URI with cough  Encounter for screening for COVID-19     Discharge Instructions      You have a viral upper respiratory infection.  Symptoms should improve over the next week to 10 days.  If you develop chest pain or shortness of breath, go to the emergency room.  We have tested you today for COVID-19.  You will see the results in Mychart and we will call you with positive results.  Please stay home and isolate until you are aware of the results.    Some things that can make you feel better are: - Increased rest - Increasing fluid with water/sugar free electrolytes - Acetaminophen and ibuprofen as needed for fever/pain - Salt water gargling, chloraseptic spray and throat lozenges - OTC guaifenesin (Mucinex) 600 mg twice daily - Saline sinus flushes or a neti pot - Humidifying the air     ED Prescriptions   None    PDMP not reviewed this encounter.   Valentino Nose, NP 09/05/22 [phone]

## 2022-09-06 LAB — SARS CORONAVIRUS 2 (TAT 6-24 HRS): SARS Coronavirus 2: NEGATIVE

## 2022-09-12 DIAGNOSIS — G4719 Other hypersomnia: Secondary | ICD-10-CM | POA: Diagnosis not present

## 2022-09-12 DIAGNOSIS — I10 Essential (primary) hypertension: Secondary | ICD-10-CM | POA: Diagnosis not present

## 2022-09-12 DIAGNOSIS — E669 Obesity, unspecified: Secondary | ICD-10-CM | POA: Diagnosis not present

## 2022-09-13 DIAGNOSIS — M9903 Segmental and somatic dysfunction of lumbar region: Secondary | ICD-10-CM | POA: Diagnosis not present

## 2022-09-13 DIAGNOSIS — M9905 Segmental and somatic dysfunction of pelvic region: Secondary | ICD-10-CM | POA: Diagnosis not present

## 2022-09-13 DIAGNOSIS — M9902 Segmental and somatic dysfunction of thoracic region: Secondary | ICD-10-CM | POA: Diagnosis not present

## 2022-09-13 DIAGNOSIS — M62830 Muscle spasm of back: Secondary | ICD-10-CM | POA: Diagnosis not present

## 2022-09-16 ENCOUNTER — Encounter: Payer: Medicare Other | Admitting: Physical Therapy

## 2022-09-19 DIAGNOSIS — Z85828 Personal history of other malignant neoplasm of skin: Secondary | ICD-10-CM | POA: Diagnosis not present

## 2022-09-19 DIAGNOSIS — Z5189 Encounter for other specified aftercare: Secondary | ICD-10-CM | POA: Diagnosis not present

## 2022-09-23 ENCOUNTER — Ambulatory Visit: Payer: Medicare Other | Admitting: Physical Therapy

## 2022-10-11 DIAGNOSIS — M9903 Segmental and somatic dysfunction of lumbar region: Secondary | ICD-10-CM | POA: Diagnosis not present

## 2022-10-11 DIAGNOSIS — M9905 Segmental and somatic dysfunction of pelvic region: Secondary | ICD-10-CM | POA: Diagnosis not present

## 2022-10-11 DIAGNOSIS — M9902 Segmental and somatic dysfunction of thoracic region: Secondary | ICD-10-CM | POA: Diagnosis not present

## 2022-10-11 DIAGNOSIS — M62830 Muscle spasm of back: Secondary | ICD-10-CM | POA: Diagnosis not present

## 2022-10-14 DIAGNOSIS — I10 Essential (primary) hypertension: Secondary | ICD-10-CM | POA: Diagnosis not present

## 2022-10-14 DIAGNOSIS — G4733 Obstructive sleep apnea (adult) (pediatric): Secondary | ICD-10-CM | POA: Diagnosis not present

## 2022-10-14 DIAGNOSIS — E669 Obesity, unspecified: Secondary | ICD-10-CM | POA: Diagnosis not present

## 2022-10-25 DIAGNOSIS — M62830 Muscle spasm of back: Secondary | ICD-10-CM | POA: Diagnosis not present

## 2022-10-25 DIAGNOSIS — M9902 Segmental and somatic dysfunction of thoracic region: Secondary | ICD-10-CM | POA: Diagnosis not present

## 2022-10-25 DIAGNOSIS — M9905 Segmental and somatic dysfunction of pelvic region: Secondary | ICD-10-CM | POA: Diagnosis not present

## 2022-10-25 DIAGNOSIS — M9903 Segmental and somatic dysfunction of lumbar region: Secondary | ICD-10-CM | POA: Diagnosis not present

## 2022-11-05 DIAGNOSIS — D649 Anemia, unspecified: Secondary | ICD-10-CM | POA: Diagnosis not present

## 2022-11-05 DIAGNOSIS — R7303 Prediabetes: Secondary | ICD-10-CM | POA: Diagnosis not present

## 2022-11-05 DIAGNOSIS — R5383 Other fatigue: Secondary | ICD-10-CM | POA: Diagnosis not present

## 2022-11-05 DIAGNOSIS — R21 Rash and other nonspecific skin eruption: Secondary | ICD-10-CM | POA: Diagnosis not present

## 2022-11-05 DIAGNOSIS — M549 Dorsalgia, unspecified: Secondary | ICD-10-CM | POA: Diagnosis not present

## 2022-11-05 DIAGNOSIS — M6208 Separation of muscle (nontraumatic), other site: Secondary | ICD-10-CM | POA: Diagnosis not present

## 2022-11-05 DIAGNOSIS — K439 Ventral hernia without obstruction or gangrene: Secondary | ICD-10-CM | POA: Diagnosis not present

## 2022-11-05 DIAGNOSIS — F419 Anxiety disorder, unspecified: Secondary | ICD-10-CM | POA: Diagnosis not present

## 2022-11-05 DIAGNOSIS — F330 Major depressive disorder, recurrent, mild: Secondary | ICD-10-CM | POA: Diagnosis not present

## 2022-11-07 DIAGNOSIS — M62830 Muscle spasm of back: Secondary | ICD-10-CM | POA: Diagnosis not present

## 2022-11-07 DIAGNOSIS — M9903 Segmental and somatic dysfunction of lumbar region: Secondary | ICD-10-CM | POA: Diagnosis not present

## 2022-11-07 DIAGNOSIS — M9902 Segmental and somatic dysfunction of thoracic region: Secondary | ICD-10-CM | POA: Diagnosis not present

## 2022-11-07 DIAGNOSIS — M9905 Segmental and somatic dysfunction of pelvic region: Secondary | ICD-10-CM | POA: Diagnosis not present

## 2022-11-15 DIAGNOSIS — M9905 Segmental and somatic dysfunction of pelvic region: Secondary | ICD-10-CM | POA: Diagnosis not present

## 2022-11-15 DIAGNOSIS — M62830 Muscle spasm of back: Secondary | ICD-10-CM | POA: Diagnosis not present

## 2022-11-15 DIAGNOSIS — M9903 Segmental and somatic dysfunction of lumbar region: Secondary | ICD-10-CM | POA: Diagnosis not present

## 2022-11-15 DIAGNOSIS — M9902 Segmental and somatic dysfunction of thoracic region: Secondary | ICD-10-CM | POA: Diagnosis not present

## 2022-11-22 DIAGNOSIS — D509 Iron deficiency anemia, unspecified: Secondary | ICD-10-CM | POA: Diagnosis not present

## 2022-11-29 DIAGNOSIS — M9905 Segmental and somatic dysfunction of pelvic region: Secondary | ICD-10-CM | POA: Diagnosis not present

## 2022-11-29 DIAGNOSIS — M9902 Segmental and somatic dysfunction of thoracic region: Secondary | ICD-10-CM | POA: Diagnosis not present

## 2022-11-29 DIAGNOSIS — M62830 Muscle spasm of back: Secondary | ICD-10-CM | POA: Diagnosis not present

## 2022-11-29 DIAGNOSIS — M9903 Segmental and somatic dysfunction of lumbar region: Secondary | ICD-10-CM | POA: Diagnosis not present

## 2022-12-06 ENCOUNTER — Encounter: Payer: Self-pay | Admitting: Nurse Practitioner

## 2022-12-13 DIAGNOSIS — M9902 Segmental and somatic dysfunction of thoracic region: Secondary | ICD-10-CM | POA: Diagnosis not present

## 2022-12-13 DIAGNOSIS — M9905 Segmental and somatic dysfunction of pelvic region: Secondary | ICD-10-CM | POA: Diagnosis not present

## 2022-12-13 DIAGNOSIS — M62830 Muscle spasm of back: Secondary | ICD-10-CM | POA: Diagnosis not present

## 2022-12-13 DIAGNOSIS — M9903 Segmental and somatic dysfunction of lumbar region: Secondary | ICD-10-CM | POA: Diagnosis not present

## 2022-12-16 DIAGNOSIS — L570 Actinic keratosis: Secondary | ICD-10-CM | POA: Diagnosis not present

## 2022-12-18 DIAGNOSIS — D509 Iron deficiency anemia, unspecified: Secondary | ICD-10-CM | POA: Diagnosis not present

## 2022-12-18 DIAGNOSIS — R42 Dizziness and giddiness: Secondary | ICD-10-CM | POA: Diagnosis not present

## 2022-12-18 DIAGNOSIS — R195 Other fecal abnormalities: Secondary | ICD-10-CM | POA: Diagnosis not present

## 2022-12-27 DIAGNOSIS — M9903 Segmental and somatic dysfunction of lumbar region: Secondary | ICD-10-CM | POA: Diagnosis not present

## 2022-12-27 DIAGNOSIS — M9905 Segmental and somatic dysfunction of pelvic region: Secondary | ICD-10-CM | POA: Diagnosis not present

## 2022-12-27 DIAGNOSIS — M9902 Segmental and somatic dysfunction of thoracic region: Secondary | ICD-10-CM | POA: Diagnosis not present

## 2022-12-27 DIAGNOSIS — M62830 Muscle spasm of back: Secondary | ICD-10-CM | POA: Diagnosis not present

## 2023-01-31 DIAGNOSIS — M9902 Segmental and somatic dysfunction of thoracic region: Secondary | ICD-10-CM | POA: Diagnosis not present

## 2023-01-31 DIAGNOSIS — M9903 Segmental and somatic dysfunction of lumbar region: Secondary | ICD-10-CM | POA: Diagnosis not present

## 2023-01-31 DIAGNOSIS — M62830 Muscle spasm of back: Secondary | ICD-10-CM | POA: Diagnosis not present

## 2023-01-31 DIAGNOSIS — M9905 Segmental and somatic dysfunction of pelvic region: Secondary | ICD-10-CM | POA: Diagnosis not present

## 2023-02-07 DIAGNOSIS — H04122 Dry eye syndrome of left lacrimal gland: Secondary | ICD-10-CM | POA: Diagnosis not present

## 2023-02-07 DIAGNOSIS — H0100B Unspecified blepharitis left eye, upper and lower eyelids: Secondary | ICD-10-CM | POA: Diagnosis not present

## 2023-02-07 DIAGNOSIS — T1512XA Foreign body in conjunctival sac, left eye, initial encounter: Secondary | ICD-10-CM | POA: Diagnosis not present

## 2023-02-19 ENCOUNTER — Other Ambulatory Visit (INDEPENDENT_AMBULATORY_CARE_PROVIDER_SITE_OTHER): Payer: Medicare Other

## 2023-02-19 ENCOUNTER — Ambulatory Visit (INDEPENDENT_AMBULATORY_CARE_PROVIDER_SITE_OTHER): Payer: Medicare Other | Admitting: Nurse Practitioner

## 2023-02-19 ENCOUNTER — Encounter: Payer: Self-pay | Admitting: Nurse Practitioner

## 2023-02-19 VITALS — BP 138/66 | Ht 63.5 in | Wt 155.0 lb

## 2023-02-19 DIAGNOSIS — D509 Iron deficiency anemia, unspecified: Secondary | ICD-10-CM

## 2023-02-19 LAB — CBC WITH DIFFERENTIAL/PLATELET
Basophils Absolute: 0 10*3/uL (ref 0.0–0.1)
Basophils Relative: 0.8 % (ref 0.0–3.0)
Eosinophils Absolute: 0.1 10*3/uL (ref 0.0–0.7)
Eosinophils Relative: 1.6 % (ref 0.0–5.0)
HCT: 40.7 % (ref 36.0–46.0)
Hemoglobin: 13 g/dL (ref 12.0–15.0)
Lymphocytes Relative: 26.3 % (ref 12.0–46.0)
Lymphs Abs: 1.4 10*3/uL (ref 0.7–4.0)
MCHC: 31.9 g/dL (ref 30.0–36.0)
MCV: 83.8 fl (ref 78.0–100.0)
Monocytes Absolute: 0.5 10*3/uL (ref 0.1–1.0)
Monocytes Relative: 8.7 % (ref 3.0–12.0)
Neutro Abs: 3.4 10*3/uL (ref 1.4–7.7)
Neutrophils Relative %: 62.6 % (ref 43.0–77.0)
Platelets: 336 10*3/uL (ref 150.0–400.0)
RBC: 4.86 Mil/uL (ref 3.87–5.11)
RDW: 17.8 % — ABNORMAL HIGH (ref 11.5–15.5)
WBC: 5.5 10*3/uL (ref 4.0–10.5)

## 2023-02-19 LAB — COMPREHENSIVE METABOLIC PANEL
ALT: 36 U/L — ABNORMAL HIGH (ref 0–35)
AST: 28 U/L (ref 0–37)
Albumin: 3.9 g/dL (ref 3.5–5.2)
Alkaline Phosphatase: 70 U/L (ref 39–117)
BUN: 18 mg/dL (ref 6–23)
CO2: 28 mEq/L (ref 19–32)
Calcium: 9.6 mg/dL (ref 8.4–10.5)
Chloride: 105 mEq/L (ref 96–112)
Creatinine, Ser: 0.85 mg/dL (ref 0.40–1.20)
GFR: 63.34 mL/min (ref >60.00)
Glucose, Bld: 115 mg/dL — ABNORMAL HIGH (ref 70–99)
Potassium: 4.6 mEq/L (ref 3.5–5.1)
Sodium: 139 mEq/L (ref 135–145)
Total Bilirubin: 0.7 mg/dL (ref 0.2–1.2)
Total Protein: 6.9 g/dL (ref 6.0–8.3)

## 2023-02-19 LAB — IBC + FERRITIN
Ferritin: 14.1 ng/mL (ref 10.0–291.0)
Iron: 42 ug/dL (ref 42–145)
Saturation Ratios: 9.4 % — ABNORMAL LOW (ref 20.0–50.0)
TIBC: 445.2 ug/dL (ref 250.0–450.0)
Transferrin: 318 mg/dL (ref 212.0–360.0)

## 2023-02-19 LAB — VITAMIN B12: Vitamin B-12: 1121 pg/mL — ABNORMAL HIGH (ref 211–911)

## 2023-02-19 NOTE — Progress Notes (Signed)
Attending Physician's Attestation   I have reviewed the chart.   I agree with the Advanced Practitioner's note, impression, and recommendations with any updates as below. Agree that in the setting of her positive FOBT and iron deficiency that endoscopic evaluation is the next step for her.  Certainly we cannot force her to perform procedures.  We should ensure that this patient's primary care provider gets the note from today and we will see what the labs look like and then patient will have opportunity to consider scheduling endoscopic evaluation.   Corliss Parish, MD [facility] Advanced Endoscopy Office # [phone]

## 2023-02-19 NOTE — Progress Notes (Signed)
02/19/2023 Laura Carter [id number] 1940-08-01   CHIEF COMPLAINT: Iron deficiency anemia  HISTORY OF PRESENT ILLNESS: Laura Carter is an 82 year old female with a past medical history of osteoarthritis, spinal stenosis, asthma, hypertension, diastolic dysfunction, sleep apnea, diverticulosis, hiatal hernia, esophageal ulcer, GERD and remote history of Barrett's esophagus. S/P cholecystectomy 02/2009. She presents to our office today as referred by Dr. Laurann Montana for further evaluation regarding iron deficiency anemia with a positive FOBT. She underwent laboratory studies around 10/2022 which showed evidence of iron deficiency anemia (lab results not available in Care everywhere). She was started on oral iron once daily.  Repeat laboratory studies 12/19/2022 showed a hemoglobin level of 12.4.  Hematocrit 40.1.  MCV 84.6.  Iron 38.  Iron saturation 9%. TIBC 440.  At that time, her PCP discussed she could decrease the oral iron to 2 to 3 days weekly which she elected to stop it at that time because it caused an excessive amount of abdominal gas.  She denies having any nausea or vomiting.  No dysphagia or heartburn.  No upper or lower abdominal pain.  She is passing 2-3 normal formed brown bowel movements daily.  She takes prune lax and magnesium daily.  No rectal bleeding or black stools.  She takes Omeprazole 20 mg p.o. daily for many years and she is not sure why she takes it.  She previously took Excedrin 2 tabs and Tylenol 2 tabs most days for headaches, lower back pain and leg pain.  She stopped taking the Excedrin a few months ago when she was informed she had iron deficiency anemia.  She remains on ASA 81 mg daily.  Her stress level has been elevated and she has felt anxious since her husband was admitted to the hospital due to cardiac issues 12/2022 which resulted in placement of fibrillator and pacemaker.  EGD 08/24/2003:  GERD and a hiatal hernia Gastroesophageal junction mucosa with  mild inflammation consistent with gastroesophageal reflux.  No intestinal metaplasia, dysplasia or malignancy identified.  Colonoscopy 03/02/1999 by Dr. Juanda Chance: Left-sided diverticulosis Internal hemorrhoids 5-year recall colonoscopy  EGD 03/01/1999: The proximal and mid esophageal mucosa were unremarkable.  At the GE junction, there was a small circular ulceration on the esophagus side of the GE junction.  There was also a linear erosion at the GE junction.  There was no strictures and no hiatal hernia. I. DUODENAL BIOPSY: MILD CHRONIC INFLAMMATION AND GASTRIC   METAPLASIA.    II. GASTROESOPHAGEAL JUNCTION BIOPSY: INTESTINAL METAPLASIA   CONSISTENT WITH BARRETT' S ESOPHAGUS, NO DYSPLASIA OR MALIGNANCY   IDENTIFIED.   Past Medical History:  Diagnosis Date   Asthma    Diastolic dysfunction    GERD (gastroesophageal reflux disease)    History of hiatal hernia    Hypertension    Osteoarthritis of left knee    Past Surgical History:  Procedure Laterality Date   BREAST BIOPSY Left    years ago- no scar ? needle biopsy only   CHOLECYSTECTOMY  10/10   COLONOSCOPY     ESOPHAGOGASTRODUODENOSCOPY     EYE SURGERY     cataract right   KNEE ARTHROSCOPY WITH MEDIAL MENISECTOMY Left 06/16/2015   Procedure: KNEE ARTHROSCOPY WITH PARTIAL MEDIAL MENISECTOMY;  Surgeon: Vickki Hearing, MD;  Location: [facility];  Service: Orthopedics;  Laterality: Left;   PARTIAL KNEE ARTHROPLASTY Left 07/08/2017   Procedure: UNICOMPARTMENTAL KNEE;  Surgeon: Sheral Apley, MD;  Location: [facility];  Service: Orthopedics;  Laterality: Left;  Social History: She is married.  She has 1 son and 1 daughter.  Past smoker, quit smoking cigarettes 57 years ago.  No alcohol use.  No drug use.  Family History: Mother with history of breast and stomach cancer.  Father and sister with history of heart disease.  No known family history of colorectal cancer.  No Known Allergies    Outpatient Encounter Medications as of  02/19/2023  Medication Sig   aspirin EC 81 MG tablet Take 1 tablet (81 mg total) by mouth daily.   Cholecalciferol (VITAMIN D3 PO) Take 1 tablet by mouth daily. 2000 units   Cyanocobalamin (VITAMIN B-12 PO) Take 1 tablet by mouth daily.   esomeprazole (NEXIUM) 20 MG capsule Take 20 mg by mouth daily at 12 noon.   metoprolol succinate (TOPROL-XL) 25 MG 24 hr tablet TAKE (1) TABLET BY MOUTH ONCE DAILY.   Multiple Vitamins-Minerals (ZINC PO) Take 1 tablet by mouth daily as needed (for immune health/onset of cold).   Turmeric 500 MG CAPS Take by mouth.   ADVAIR DISKUS 100-50 MCG/ACT AEPB 1 puff 2 (two) times daily. (Patient not taking: Reported on 02/19/2023)   albuterol (VENTOLIN HFA) 108 (90 Base) MCG/ACT inhaler Inhale 1-2 puffs into the lungs every 6 (six) hours as needed for wheezing or shortness of breath. (Patient not taking: Reported on 02/19/2023)   [DISCONTINUED] glucosamine-chondroitin 500-400 MG tablet Take 1 tablet by mouth 3 (three) times daily. (Patient not taking: Reported on 11/05/2021)   [DISCONTINUED] pregabalin (LYRICA) 25 MG capsule Take 1 capsule (25 mg total) by mouth 2 (two) times daily. - for nerve pain   No facility-administered encounter medications on file as of 02/19/2023.   REVIEW OF SYSTEMS:  Gen: Denies fever, sweats or chills. No weight loss.  CV: Denies chest pain, palpitations or edema. Resp: Denies cough, shortness of breath of hemoptysis.  GI: Denies heartburn, dysphagia, stomach or lower abdominal pain. No diarrhea or constipation.  GU: Denies urinary burning, blood in urine, increased urinary frequency or incontinence. MS: + Back pain, spinal stenosis.  Derm: Denies rash, itchiness, skin lesions or unhealing ulcers. Psych: + Anxiety. Heme: Denies bruising, easy bleeding. Neuro:  + Gait imbalance. Denies headaches, dizziness or paresthesias. Endo:  Denies any problems with DM, thyroid or adrenal function.  PHYSICAL EXAM: BP 138/66   Ht 5' 3.5" (1.613 m)    Wt 155 lb (70.3 kg)   BMI 27.03 kg/m  General: 82 year old female in no acute distress. Head: Normocephalic and atraumatic. Eyes:  Sclerae non-icteric, conjunctive pink. Ears: Normal auditory acuity. Mouth: Dentition intact. No ulcers or lesions.  Neck: Supple, no lymphadenopathy or thyromegaly.  Lungs: Clear bilaterally to auscultation without wheezes, crackles or rhonchi. Heart: Regular rate and rhythm. No murmur, rub or gallop appreciated.  Abdomen: Soft, nontender, nondistended.  RUQ scar with incisional hernia, soft, nontender. No masses. No hepatosplenomegaly. Normoactive bowel sounds x 4 quadrants.  Rectal: Deferred.  Musculoskeletal: Symmetrical with no gross deformities. Skin: Warm and dry. No rash or lesions on visible extremities. Extremities: No edema. Neurological: Alert oriented x 4, no focal deficits.  Psychological:  Alert and cooperative. Normal mood and affect.  ASSESSMENT AND PLAN:  82 year old female with iron deficiency anemia with positive FOBT. No overt GI bleeding. + Chronic NSAID use.  Patient stopped taking oral iron about 8 weeks ago. -Obtain copy of laboratory studies several months ago which identified new onset IDA. -Check CBC, IBC + ferritin and vitamin B12 level today -EGD and colonoscopy benefits and risks  discussed including risk with sedation, risk of bleeding, perforation and infection.  -Patient does not wish to schedule an EGD and colonoscopy at this time as she is worried about her husband who recently underwent extensive cardiac evaluation including placement of a defibrillator and pacemaker.  She will contact our office when she is ready to schedule these procedures, however, if the above laboratory results indicate worsening iron deficiency anemia she will reconsider scheduling an EGD and colonoscopy as soon as possible.  History of GERD, remote Barrett's esophagus and a hiatal hernia.  Mother with history of gastric cancer. -Continue Esomeprazole  20 mg p.o. daily -GERD diet -See endoscopic recommendations as noted above       CC:  Aliene Beams, MD

## 2023-02-19 NOTE — Patient Instructions (Addendum)
Your provider has requested that you go to the basement level for lab work before leaving today. Press "B" on the elevator. The lab is located at the first door on the left as you exit the elevator.  Contact our office when you are ready to schedule an upper endoscopy & colonoscopy.   Due to recent changes in healthcare laws, you may see the results of your imaging and laboratory studies on MyChart before your provider has had a chance to review them.  We understand that in some cases there may be results that are confusing or concerning to you. Not all laboratory results come back in the same time frame and the provider may be waiting for multiple results in order to interpret others.  Please give [place] 48 hours in order for your provider to thoroughly review all the results before contacting the office for clarification of your results.    Thank you for trusting me with your gastrointestinal care!   Alcide Evener, CRNP

## 2023-02-19 NOTE — Progress Notes (Signed)
Viviann Spare, pls fax a copy of today's office consult note to patient's PCP. See referral and PCP records under media. THX.

## 2023-02-20 ENCOUNTER — Telehealth: Payer: Self-pay

## 2023-02-20 NOTE — Telephone Encounter (Signed)
Copy faxed.

## 2023-02-20 NOTE — Telephone Encounter (Signed)
Author: Arnaldo Natal, NP Service: Gastroenterology Author Type: Nurse Practitioner  Filed: 02/19/2023  4:58 PM Encounter Date: 02/19/2023 Status: Signed  Editor: Arnaldo Natal, NP (Nurse Practitioner)   Viviann Spare, pls fax a copy of today's office consult note to patient's PCP. See referral and PCP records under media. THX.

## 2023-02-24 ENCOUNTER — Other Ambulatory Visit: Payer: Self-pay

## 2023-02-24 DIAGNOSIS — D509 Iron deficiency anemia, unspecified: Secondary | ICD-10-CM

## 2023-02-24 MED ORDER — FERROUS SULFATE 325 (65 FE) MG PO TABS: 325.0000 mg | ORAL_TABLET | Freq: Every day | ORAL | 1 refills | Status: DC

## 2023-02-25 ENCOUNTER — Other Ambulatory Visit: Payer: Self-pay | Admitting: Family Medicine

## 2023-02-25 ENCOUNTER — Ambulatory Visit
Admission: RE | Admit: 2023-02-25 | Discharge: 2023-02-25 | Disposition: A | Discharge status: Home / Self Care | Payer: Medicare Other | Source: Ambulatory Visit | Attending: Family Medicine

## 2023-02-25 DIAGNOSIS — Z1231 Encounter for screening mammogram for malignant neoplasm of breast: Secondary | ICD-10-CM

## 2023-02-28 DIAGNOSIS — H18593 Other hereditary corneal dystrophies, bilateral: Secondary | ICD-10-CM | POA: Diagnosis not present

## 2023-02-28 DIAGNOSIS — H35373 Puckering of macula, bilateral: Secondary | ICD-10-CM | POA: Diagnosis not present

## 2023-02-28 DIAGNOSIS — H04123 Dry eye syndrome of bilateral lacrimal glands: Secondary | ICD-10-CM | POA: Diagnosis not present

## 2023-02-28 DIAGNOSIS — H26493 Other secondary cataract, bilateral: Secondary | ICD-10-CM | POA: Diagnosis not present

## 2023-03-03 ENCOUNTER — Telehealth: Payer: Self-pay | Admitting: Nurse Practitioner

## 2023-03-03 DIAGNOSIS — M9902 Segmental and somatic dysfunction of thoracic region: Secondary | ICD-10-CM | POA: Diagnosis not present

## 2023-03-03 DIAGNOSIS — M9903 Segmental and somatic dysfunction of lumbar region: Secondary | ICD-10-CM | POA: Diagnosis not present

## 2023-03-03 DIAGNOSIS — M62830 Muscle spasm of back: Secondary | ICD-10-CM | POA: Diagnosis not present

## 2023-03-03 DIAGNOSIS — M9905 Segmental and somatic dysfunction of pelvic region: Secondary | ICD-10-CM | POA: Diagnosis not present

## 2023-03-03 NOTE — Telephone Encounter (Signed)
Inbound call from patient stating she received a call on 10/4 from DD. Patient states she is returning phone call regarding labs, requesting a call back. Please advise, thank you.

## 2023-03-04 DIAGNOSIS — L729 Follicular cyst of the skin and subcutaneous tissue, unspecified: Secondary | ICD-10-CM | POA: Diagnosis not present

## 2023-03-04 DIAGNOSIS — L918 Other hypertrophic disorders of the skin: Secondary | ICD-10-CM | POA: Diagnosis not present

## 2023-03-04 NOTE — Telephone Encounter (Signed)
Contacted pt.-LVM

## 2023-03-18 DIAGNOSIS — H26492 Other secondary cataract, left eye: Secondary | ICD-10-CM | POA: Diagnosis not present

## 2023-03-25 DIAGNOSIS — H26491 Other secondary cataract, right eye: Secondary | ICD-10-CM | POA: Diagnosis not present

## 2023-03-28 DIAGNOSIS — M9903 Segmental and somatic dysfunction of lumbar region: Secondary | ICD-10-CM | POA: Diagnosis not present

## 2023-03-28 DIAGNOSIS — M9905 Segmental and somatic dysfunction of pelvic region: Secondary | ICD-10-CM | POA: Diagnosis not present

## 2023-03-28 DIAGNOSIS — M9902 Segmental and somatic dysfunction of thoracic region: Secondary | ICD-10-CM | POA: Diagnosis not present

## 2023-03-28 DIAGNOSIS — M62830 Muscle spasm of back: Secondary | ICD-10-CM | POA: Diagnosis not present

## 2023-04-17 ENCOUNTER — Telehealth: Payer: Self-pay | Admitting: Nurse Practitioner

## 2023-04-17 NOTE — Telephone Encounter (Signed)
DD, refer to lab 02/19/2023 notes, pls contact patient and kindly remind her to return to our lab to recheck her CBC and iron levels, lab orders were previously entered.

## 2023-04-17 NOTE — Telephone Encounter (Signed)
Contacted patient and patient stated that she was unable to get the labs due to husband being sick. Patient stated she will try to get them done soon whenever she has the availability.

## 2023-04-18 DIAGNOSIS — M9903 Segmental and somatic dysfunction of lumbar region: Secondary | ICD-10-CM | POA: Diagnosis not present

## 2023-04-18 DIAGNOSIS — M9902 Segmental and somatic dysfunction of thoracic region: Secondary | ICD-10-CM | POA: Diagnosis not present

## 2023-04-18 DIAGNOSIS — M9905 Segmental and somatic dysfunction of pelvic region: Secondary | ICD-10-CM | POA: Diagnosis not present

## 2023-04-18 DIAGNOSIS — M62830 Muscle spasm of back: Secondary | ICD-10-CM | POA: Diagnosis not present

## 2023-04-22 DIAGNOSIS — G4733 Obstructive sleep apnea (adult) (pediatric): Secondary | ICD-10-CM | POA: Diagnosis not present

## 2023-04-23 DIAGNOSIS — Z0000 Encounter for general adult medical examination without abnormal findings: Secondary | ICD-10-CM | POA: Diagnosis not present

## 2023-04-23 DIAGNOSIS — I700 Atherosclerosis of aorta: Secondary | ICD-10-CM | POA: Diagnosis not present

## 2023-04-23 DIAGNOSIS — E7800 Pure hypercholesterolemia, unspecified: Secondary | ICD-10-CM | POA: Diagnosis not present

## 2023-04-23 DIAGNOSIS — D509 Iron deficiency anemia, unspecified: Secondary | ICD-10-CM | POA: Diagnosis not present

## 2023-04-23 DIAGNOSIS — I10 Essential (primary) hypertension: Secondary | ICD-10-CM | POA: Diagnosis not present

## 2023-04-23 DIAGNOSIS — E669 Obesity, unspecified: Secondary | ICD-10-CM | POA: Diagnosis not present

## 2023-04-23 DIAGNOSIS — J4520 Mild intermittent asthma, uncomplicated: Secondary | ICD-10-CM | POA: Diagnosis not present

## 2023-04-23 DIAGNOSIS — G4733 Obstructive sleep apnea (adult) (pediatric): Secondary | ICD-10-CM | POA: Diagnosis not present

## 2023-04-23 DIAGNOSIS — E119 Type 2 diabetes mellitus without complications: Secondary | ICD-10-CM | POA: Diagnosis not present

## 2023-04-23 DIAGNOSIS — E538 Deficiency of other specified B group vitamins: Secondary | ICD-10-CM | POA: Diagnosis not present

## 2023-04-23 DIAGNOSIS — K2270 Barrett's esophagus without dysplasia: Secondary | ICD-10-CM | POA: Diagnosis not present

## 2023-05-02 DIAGNOSIS — E119 Type 2 diabetes mellitus without complications: Secondary | ICD-10-CM | POA: Diagnosis not present

## 2023-05-02 DIAGNOSIS — E538 Deficiency of other specified B group vitamins: Secondary | ICD-10-CM | POA: Diagnosis not present

## 2023-05-02 DIAGNOSIS — E7800 Pure hypercholesterolemia, unspecified: Secondary | ICD-10-CM | POA: Diagnosis not present

## 2023-05-02 DIAGNOSIS — Z23 Encounter for immunization: Secondary | ICD-10-CM | POA: Diagnosis not present

## 2023-05-02 DIAGNOSIS — D509 Iron deficiency anemia, unspecified: Secondary | ICD-10-CM | POA: Diagnosis not present

## 2023-05-02 DIAGNOSIS — I10 Essential (primary) hypertension: Secondary | ICD-10-CM | POA: Diagnosis not present

## 2023-05-09 DIAGNOSIS — M62830 Muscle spasm of back: Secondary | ICD-10-CM | POA: Diagnosis not present

## 2023-05-09 DIAGNOSIS — M9902 Segmental and somatic dysfunction of thoracic region: Secondary | ICD-10-CM | POA: Diagnosis not present

## 2023-05-09 DIAGNOSIS — M9903 Segmental and somatic dysfunction of lumbar region: Secondary | ICD-10-CM | POA: Diagnosis not present

## 2023-05-09 DIAGNOSIS — M9905 Segmental and somatic dysfunction of pelvic region: Secondary | ICD-10-CM | POA: Diagnosis not present

## 2023-05-26 DIAGNOSIS — Z85828 Personal history of other malignant neoplasm of skin: Secondary | ICD-10-CM | POA: Diagnosis not present

## 2023-05-26 DIAGNOSIS — L814 Other melanin hyperpigmentation: Secondary | ICD-10-CM | POA: Diagnosis not present

## 2023-05-26 DIAGNOSIS — D225 Melanocytic nevi of trunk: Secondary | ICD-10-CM | POA: Diagnosis not present

## 2023-05-26 DIAGNOSIS — L821 Other seborrheic keratosis: Secondary | ICD-10-CM | POA: Diagnosis not present

## 2023-05-26 DIAGNOSIS — L570 Actinic keratosis: Secondary | ICD-10-CM | POA: Diagnosis not present

## 2023-06-02 ENCOUNTER — Telehealth: Payer: Self-pay | Admitting: Nurse Practitioner

## 2023-06-02 NOTE — Telephone Encounter (Signed)
 DD, pls contact patient and provide her with update lab order to check a CBC and IBC + ferritin. DX : IDA. Refer to lab notes 02/19/2023. Patient previously could not return to the lab as she was busy taking care of her husband.  THX.

## 2023-06-09 NOTE — Telephone Encounter (Signed)
 Attempted to contact patient and left a voicemail regarding returning to our lab.

## 2023-06-13 DIAGNOSIS — M9903 Segmental and somatic dysfunction of lumbar region: Secondary | ICD-10-CM | POA: Diagnosis not present

## 2023-06-13 DIAGNOSIS — M62830 Muscle spasm of back: Secondary | ICD-10-CM | POA: Diagnosis not present

## 2023-06-13 DIAGNOSIS — M9905 Segmental and somatic dysfunction of pelvic region: Secondary | ICD-10-CM | POA: Diagnosis not present

## 2023-06-13 DIAGNOSIS — M9902 Segmental and somatic dysfunction of thoracic region: Secondary | ICD-10-CM | POA: Diagnosis not present

## 2023-06-24 ENCOUNTER — Other Ambulatory Visit (INDEPENDENT_AMBULATORY_CARE_PROVIDER_SITE_OTHER): Payer: Medicare Other

## 2023-06-24 DIAGNOSIS — D509 Iron deficiency anemia, unspecified: Secondary | ICD-10-CM | POA: Diagnosis not present

## 2023-06-24 LAB — CBC WITH DIFFERENTIAL/PLATELET
Basophils Absolute: 0.1 10*3/uL (ref 0.0–0.1)
Basophils Relative: 1 % (ref 0.0–3.0)
Eosinophils Absolute: 0.2 10*3/uL (ref 0.0–0.7)
Eosinophils Relative: 2.8 % (ref 0.0–5.0)
HCT: 41.5 % (ref 36.0–46.0)
Hemoglobin: 13.3 g/dL (ref 12.0–15.0)
Lymphocytes Relative: 25.2 % (ref 12.0–46.0)
Lymphs Abs: 1.4 10*3/uL (ref 0.7–4.0)
MCHC: 32.1 g/dL (ref 30.0–36.0)
MCV: 88.5 fL (ref 78.0–100.0)
Monocytes Absolute: 0.5 10*3/uL (ref 0.1–1.0)
Monocytes Relative: 9.4 % (ref 3.0–12.0)
Neutro Abs: 3.4 10*3/uL (ref 1.4–7.7)
Neutrophils Relative %: 61.6 % (ref 43.0–77.0)
Platelets: 327 10*3/uL (ref 150.0–400.0)
RBC: 4.7 Mil/uL (ref 3.87–5.11)
RDW: 15.8 % — ABNORMAL HIGH (ref 11.5–15.5)
WBC: 5.5 10*3/uL (ref 4.0–10.5)

## 2023-06-24 LAB — IBC + FERRITIN
Ferritin: 11.1 ng/mL (ref 10.0–291.0)
Iron: 60 ug/dL (ref 42–145)
Saturation Ratios: 14.3 % — ABNORMAL LOW (ref 20.0–50.0)
TIBC: 420 ug/dL (ref 250.0–450.0)
Transferrin: 300 mg/dL (ref 212.0–360.0)

## 2023-07-04 DIAGNOSIS — M62830 Muscle spasm of back: Secondary | ICD-10-CM | POA: Diagnosis not present

## 2023-07-04 DIAGNOSIS — M9903 Segmental and somatic dysfunction of lumbar region: Secondary | ICD-10-CM | POA: Diagnosis not present

## 2023-07-04 DIAGNOSIS — M9902 Segmental and somatic dysfunction of thoracic region: Secondary | ICD-10-CM | POA: Diagnosis not present

## 2023-07-04 DIAGNOSIS — M9905 Segmental and somatic dysfunction of pelvic region: Secondary | ICD-10-CM | POA: Diagnosis not present

## 2023-07-28 DIAGNOSIS — M79652 Pain in left thigh: Secondary | ICD-10-CM | POA: Diagnosis not present

## 2023-07-28 DIAGNOSIS — M545 Low back pain, unspecified: Secondary | ICD-10-CM | POA: Diagnosis not present

## 2023-07-28 DIAGNOSIS — M25552 Pain in left hip: Secondary | ICD-10-CM | POA: Diagnosis not present

## 2023-08-01 DIAGNOSIS — M9903 Segmental and somatic dysfunction of lumbar region: Secondary | ICD-10-CM | POA: Diagnosis not present

## 2023-08-01 DIAGNOSIS — M62830 Muscle spasm of back: Secondary | ICD-10-CM | POA: Diagnosis not present

## 2023-08-01 DIAGNOSIS — M9905 Segmental and somatic dysfunction of pelvic region: Secondary | ICD-10-CM | POA: Diagnosis not present

## 2023-08-01 DIAGNOSIS — M9902 Segmental and somatic dysfunction of thoracic region: Secondary | ICD-10-CM | POA: Diagnosis not present

## 2023-08-07 ENCOUNTER — Ambulatory Visit
Admission: EM | Admit: 2023-08-07 | Discharge: 2023-08-07 | Disposition: A | Discharge status: Home / Self Care | Attending: Nurse Practitioner | Admitting: Nurse Practitioner

## 2023-08-07 ENCOUNTER — Encounter: Payer: Self-pay | Admitting: Emergency Medicine

## 2023-08-07 ENCOUNTER — Other Ambulatory Visit: Payer: Self-pay

## 2023-08-07 DIAGNOSIS — J3089 Other allergic rhinitis: Secondary | ICD-10-CM

## 2023-08-07 DIAGNOSIS — H6993 Unspecified Eustachian tube disorder, bilateral: Secondary | ICD-10-CM | POA: Diagnosis not present

## 2023-08-07 DIAGNOSIS — J069 Acute upper respiratory infection, unspecified: Secondary | ICD-10-CM

## 2023-08-07 DIAGNOSIS — Z8709 Personal history of other diseases of the respiratory system: Secondary | ICD-10-CM | POA: Diagnosis not present

## 2023-08-07 LAB — POC COVID19/FLU A&B COMBO
Covid Antigen, POC: NEGATIVE
Influenza A Antigen, POC: NEGATIVE
Influenza B Antigen, POC: NEGATIVE

## 2023-08-07 MED ORDER — ALBUTEROL SULFATE HFA 108 (90 BASE) MCG/ACT IN AERS: 2.0000 | INHALATION_SPRAY | Freq: Four times a day (QID) | RESPIRATORY_TRACT | 0 refills | Status: AC | PRN

## 2023-08-07 MED ORDER — BENZONATATE 100 MG PO CAPS: 100.0000 mg | ORAL_CAPSULE | Freq: Three times a day (TID) | ORAL | 0 refills | Status: DC

## 2023-08-07 MED ORDER — FLUTICASONE PROPIONATE 50 MCG/ACT NA SUSP: 2.0000 | Freq: Every day | NASAL | 0 refills | Status: DC

## 2023-08-07 MED ORDER — PREDNISONE 20 MG PO TABS: 40.0000 mg | ORAL_TABLET | Freq: Every day | ORAL | 0 refills | Status: AC

## 2023-08-07 NOTE — Discharge Instructions (Signed)
 The COVID/flu test was negative. Take medication as prescribed. I would like for you to use over-the-counter Coricidin HBP cough medication for your cough to help keep your blood pressure under control. May take over-the-counter Tylenol as needed for pain, fever, or general discomfort. Increase fluids and allow for plenty of rest. May use normal saline nasal spray throughout the day for nasal congestion and runny nose. For your cough, continue use of your CPAP.  It may also be helpful for you to sleep elevated while symptoms persist. As discussed, if symptoms do not improve over the next several days, or appear to be worsening, you may follow-up in this clinic or with your primary care physician for further evaluation. Follow-up as needed.

## 2023-08-07 NOTE — ED Provider Notes (Signed)
 [facility]    CSN: [id number] Arrival date & time: 08/07/23  0931      History   Chief Complaint Chief Complaint  Patient presents with   Cough    HPI Laura Carter is a 83 y.o. female.   The history is provided by the patient.   Patient presents for complaints of cough, chest congestion, and runny nose.  Patient states symptoms started approximately 1 week ago, feels this was due to her allergies.  Over the past 24 hours, she developed sore throat, body aches, and worsening cough.  Denies fever, chills, ear pain, wheezing, difficulty breathing, chest pain, abdominal pain, nausea, vomiting, diarrhea, or rash.  Patient reports that she does have underlying history of asthma.  States that she has tried several over-the-counter vitamins, zinc, Echinacea, and warm teas.  She denies any obvious known sick contacts. 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 needle biopsy only   CHOLECYSTECTOMY  10/10   COLONOSCOPY     ESOPHAGOGASTRODUODENOSCOPY     EYE SURGERY     cataract right   KNEE ARTHROSCOPY WITH MEDIAL MENISECTOMY Left 06/16/2015   Procedure: KNEE ARTHROSCOPY WITH PARTIAL MEDIAL  MENISECTOMY;  Surgeon: Vickki Hearing, MD;  Location: [facility];  Service: Orthopedics;  Laterality: Left;   PARTIAL KNEE ARTHROPLASTY Left 07/08/2017   Procedure: UNICOMPARTMENTAL KNEE;  Surgeon: Sheral Apley, MD;  Location: [facility];  Service: Orthopedics;  Laterality: Left;    OB History   No obstetric history on file.      Home Medications    Prior to Admission medications   Medication Sig Start Date End Date Taking? Authorizing Provider  ADVAIR DISKUS 100-50 MCG/ACT AEPB 1 puff 2 (two) times daily. Patient not taking: Reported on 02/19/2023 05/04/21   [provider]  albuterol (VENTOLIN HFA) 108 (90 Base) MCG/ACT inhaler Inhale 1-2 puffs into the lungs every 6 (six) hours as needed for wheezing or shortness of breath. Patient not taking: Reported on 02/19/2023 05/23/21   Wallis Bamberg, PA-C  aspirin EC 81 MG tablet Take 1 tablet (81 mg total) by mouth daily. 11/18/17   Aliene Beams, MD  Cholecalciferol (VITAMIN D3 PO) Take 1 tablet by mouth daily. 2000 units    [provider]  Cyanocobalamin (VITAMIN B-12 PO) Take 1 tablet by mouth daily.    [provider]  esomeprazole (NEXIUM) 20 MG capsule Take 20 mg by mouth daily at 12 noon.    [provider]  ferrous sulfate 325 (65 FE) MG tablet Take 1 tablet (325 mg total) by mouth daily with breakfast. 02/24/23   Arnaldo Natal, NP  metoprolol  succinate (TOPROL-XL) 25 MG 24 hr tablet TAKE (1) TABLET BY MOUTH ONCE DAILY. 06/30/17   Donita Brooks, MD  Multiple Vitamins-Minerals (ZINC PO) Take 1 tablet by mouth daily as needed (for immune health/onset of cold).    [provider]  Turmeric 500 MG CAPS Take by mouth.    [provider]    Family History Family History  Problem Relation Age of Onset   Cancer Mother        breast   Stomach cancer Mother    Breast cancer Mother    Heart disease Father    Heart disease Sister    Breast cancer Sister     Social  History Social History   Tobacco Use   Smoking status: Former    Current packs/day: 0.00    Average packs/day: 1 pack/day for 10.0 years (10.0 ttl pk-yrs)    Types: Cigarettes    Start date: 06/14/1955    Quit date: 06/13/1965    Years since quitting: 58.1   Smokeless tobacco: Never  Vaping Use   Vaping status: Never Used  Substance Use Topics   Alcohol use: No   Drug use: No     Allergies   Patient has no known allergies.   Review of Systems Review of Systems Per HPI  Physical Exam Triage Vital Signs ED Triage Vitals  Encounter Vitals Group     BP 08/07/23 0942 (!) 163/88     Systolic BP Percentile --      Diastolic BP Percentile --      Pulse Rate 08/07/23 0942 73     Resp 08/07/23 0942 20     Temp 08/07/23 0942 98.1 F (36.7 C)     Temp Source 08/07/23 0942 Oral     SpO2 08/07/23 0942 94 %     Weight --      Height --      Head Circumference --      Peak Flow --      Pain Score 08/07/23 0944 1     Pain Loc --      Pain Education --      Exclude from Growth Chart --    No data found.  Updated Vital Signs BP (!) 163/88 (BP Location: Right Arm)   Pulse 73   Temp 98.1 F (36.7 C) (Oral)   Resp 20   SpO2 94%   Visual Acuity Right Eye Distance:   Left Eye Distance:   Bilateral Distance:    Right Eye Near:   Left Eye Near:    Bilateral Near:     Physical Exam Vitals and nursing note reviewed.  Constitutional:      General: She is not in acute distress.    Appearance: Normal appearance.  HENT:     Head: Normocephalic.     Right Ear: Tympanic membrane, ear canal and external ear normal.     Left Ear: Tympanic membrane, ear canal and external ear normal.     Nose: Congestion present.     Right Turbinates: Enlarged and swollen.     Left Turbinates: Enlarged and swollen.     Right Sinus: No maxillary sinus tenderness or frontal sinus tenderness.     Left Sinus: No maxillary sinus tenderness or frontal sinus tenderness.     Mouth/Throat:      Lips: Pink.     Mouth: Mucous membranes are moist.     Pharynx: Oropharynx is clear. Uvula midline. Postnasal drip present. No pharyngeal swelling,  oropharyngeal exudate, posterior oropharyngeal erythema or uvula swelling.  Eyes:     Extraocular Movements: Extraocular movements intact.     Conjunctiva/sclera: Conjunctivae normal.     Pupils: Pupils are equal, round, and reactive to light.  Cardiovascular:     Rate and Rhythm: Normal rate and regular rhythm.     Pulses: Normal pulses.     Heart sounds: Normal heart sounds.  Pulmonary:     Effort: Pulmonary effort is normal. No respiratory distress.     Breath sounds: Normal breath sounds. No stridor. No wheezing, rhonchi or rales.  Abdominal:     General: Bowel sounds are normal.     Palpations: Abdomen is soft.     Tenderness: There is no abdominal tenderness.  Musculoskeletal:     Cervical back: Normal range of motion.  Lymphadenopathy:     Cervical: No cervical adenopathy.  Skin:    General: Skin is warm and dry.  Neurological:     General: No focal deficit present.     Mental Status: She is alert and oriented to person, place, and time.  Psychiatric:        Mood and Affect: Mood normal.        Behavior: Behavior normal.      UC Treatments / Results  Labs (all labs ordered are listed, but only abnormal results are displayed) Labs Reviewed  POC COVID19/FLU A&B COMBO    EKG   Radiology No results found.  Procedures Procedures (including critical care time)  Medications Ordered in UC Medications - No data to display  Initial Impression / Assessment and Plan / UC Course  I have reviewed the triage vital signs and the nursing notes.  Pertinent labs & imaging results that were available during my care of the patient were reviewed by me and considered in my medical decision making (see chart for details).  On exam, lung sounds are clear throughout, room air sats at 94%.  COVID/flu test is negative.  Symptoms are  consistent with viral URI with cough.  It appears patient also has underlying allergic rhinitis.  Will provide symptomatic treatment with fluticasone 50 mcg nasal spray for nasal congestion and runny nose, Tessalon 100 mg for cough, prednisone 40 mg for bilateral eustachian tube dysfunction, and an albuterol inhaler for her asthma.  Supportive care recommendations were provided and discussed with the patient to include fluids, rest, over-the-counter analgesics, normal saline nasal spray, and sleeping elevated.  Discussed indications with patient regarding follow-up.  Patient was in agreement with this plan of care and verbalized understanding.  All questions were answered.  Patient stable for discharge.   Final Clinical Impressions(s) / UC Diagnoses   Final diagnoses:  None   Discharge Instructions   None    ED Prescriptions   None    PDMP not reviewed this encounter.   Abran Cantor, NP 08/07/23 1031

## 2023-08-07 NOTE — ED Triage Notes (Addendum)
 Pt reports cough, chest congestion, runny nose x1 week. Pt reports symptoms got worse last night and started with body aches and sore throat.

## 2023-08-18 DIAGNOSIS — M79652 Pain in left thigh: Secondary | ICD-10-CM | POA: Diagnosis not present

## 2023-08-18 DIAGNOSIS — M25552 Pain in left hip: Secondary | ICD-10-CM | POA: Diagnosis not present

## 2023-08-22 DIAGNOSIS — M9905 Segmental and somatic dysfunction of pelvic region: Secondary | ICD-10-CM | POA: Diagnosis not present

## 2023-08-22 DIAGNOSIS — M9902 Segmental and somatic dysfunction of thoracic region: Secondary | ICD-10-CM | POA: Diagnosis not present

## 2023-08-22 DIAGNOSIS — M62830 Muscle spasm of back: Secondary | ICD-10-CM | POA: Diagnosis not present

## 2023-08-22 DIAGNOSIS — M9903 Segmental and somatic dysfunction of lumbar region: Secondary | ICD-10-CM | POA: Diagnosis not present

## 2023-09-11 DIAGNOSIS — M25552 Pain in left hip: Secondary | ICD-10-CM | POA: Diagnosis not present

## 2023-09-19 DIAGNOSIS — M9902 Segmental and somatic dysfunction of thoracic region: Secondary | ICD-10-CM | POA: Diagnosis not present

## 2023-09-19 DIAGNOSIS — M9905 Segmental and somatic dysfunction of pelvic region: Secondary | ICD-10-CM | POA: Diagnosis not present

## 2023-09-19 DIAGNOSIS — M9903 Segmental and somatic dysfunction of lumbar region: Secondary | ICD-10-CM | POA: Diagnosis not present

## 2023-09-19 DIAGNOSIS — M62830 Muscle spasm of back: Secondary | ICD-10-CM | POA: Diagnosis not present

## 2023-10-15 DIAGNOSIS — M9905 Segmental and somatic dysfunction of pelvic region: Secondary | ICD-10-CM | POA: Diagnosis not present

## 2023-10-15 DIAGNOSIS — M9903 Segmental and somatic dysfunction of lumbar region: Secondary | ICD-10-CM | POA: Diagnosis not present

## 2023-10-15 DIAGNOSIS — M9902 Segmental and somatic dysfunction of thoracic region: Secondary | ICD-10-CM | POA: Diagnosis not present

## 2023-10-15 DIAGNOSIS — M62830 Muscle spasm of back: Secondary | ICD-10-CM | POA: Diagnosis not present

## 2023-10-22 DIAGNOSIS — M79652 Pain in left thigh: Secondary | ICD-10-CM | POA: Diagnosis not present

## 2023-10-27 DIAGNOSIS — H00011 Hordeolum externum right upper eyelid: Secondary | ICD-10-CM | POA: Diagnosis not present

## 2023-11-12 DIAGNOSIS — M79652 Pain in left thigh: Secondary | ICD-10-CM | POA: Diagnosis not present

## 2023-11-14 DIAGNOSIS — M9903 Segmental and somatic dysfunction of lumbar region: Secondary | ICD-10-CM | POA: Diagnosis not present

## 2023-11-14 DIAGNOSIS — M9902 Segmental and somatic dysfunction of thoracic region: Secondary | ICD-10-CM | POA: Diagnosis not present

## 2023-11-14 DIAGNOSIS — M9905 Segmental and somatic dysfunction of pelvic region: Secondary | ICD-10-CM | POA: Diagnosis not present

## 2023-11-14 DIAGNOSIS — M62830 Muscle spasm of back: Secondary | ICD-10-CM | POA: Diagnosis not present

## 2023-11-17 DIAGNOSIS — M1711 Unilateral primary osteoarthritis, right knee: Secondary | ICD-10-CM | POA: Diagnosis not present

## 2023-11-18 DIAGNOSIS — L821 Other seborrheic keratosis: Secondary | ICD-10-CM | POA: Diagnosis not present

## 2023-11-18 DIAGNOSIS — L570 Actinic keratosis: Secondary | ICD-10-CM | POA: Diagnosis not present

## 2023-12-12 DIAGNOSIS — M9902 Segmental and somatic dysfunction of thoracic region: Secondary | ICD-10-CM | POA: Diagnosis not present

## 2023-12-12 DIAGNOSIS — M62830 Muscle spasm of back: Secondary | ICD-10-CM | POA: Diagnosis not present

## 2023-12-12 DIAGNOSIS — M9905 Segmental and somatic dysfunction of pelvic region: Secondary | ICD-10-CM | POA: Diagnosis not present

## 2023-12-12 DIAGNOSIS — M9903 Segmental and somatic dysfunction of lumbar region: Secondary | ICD-10-CM | POA: Diagnosis not present

## 2023-12-22 DIAGNOSIS — M1711 Unilateral primary osteoarthritis, right knee: Secondary | ICD-10-CM | POA: Diagnosis not present

## 2023-12-23 DIAGNOSIS — J4520 Mild intermittent asthma, uncomplicated: Secondary | ICD-10-CM | POA: Diagnosis not present

## 2023-12-23 DIAGNOSIS — R5383 Other fatigue: Secondary | ICD-10-CM | POA: Diagnosis not present

## 2023-12-23 DIAGNOSIS — I700 Atherosclerosis of aorta: Secondary | ICD-10-CM | POA: Diagnosis not present

## 2023-12-23 DIAGNOSIS — I10 Essential (primary) hypertension: Secondary | ICD-10-CM | POA: Diagnosis not present

## 2023-12-23 DIAGNOSIS — E7800 Pure hypercholesterolemia, unspecified: Secondary | ICD-10-CM | POA: Diagnosis not present

## 2023-12-23 DIAGNOSIS — E538 Deficiency of other specified B group vitamins: Secondary | ICD-10-CM | POA: Diagnosis not present

## 2023-12-23 DIAGNOSIS — F411 Generalized anxiety disorder: Secondary | ICD-10-CM | POA: Diagnosis not present

## 2023-12-23 DIAGNOSIS — M1711 Unilateral primary osteoarthritis, right knee: Secondary | ICD-10-CM | POA: Diagnosis not present

## 2023-12-23 DIAGNOSIS — E1169 Type 2 diabetes mellitus with other specified complication: Secondary | ICD-10-CM | POA: Diagnosis not present

## 2023-12-23 DIAGNOSIS — D509 Iron deficiency anemia, unspecified: Secondary | ICD-10-CM | POA: Diagnosis not present

## 2023-12-23 DIAGNOSIS — K2270 Barrett's esophagus without dysplasia: Secondary | ICD-10-CM | POA: Diagnosis not present

## 2023-12-23 DIAGNOSIS — E559 Vitamin D deficiency, unspecified: Secondary | ICD-10-CM | POA: Diagnosis not present

## 2023-12-24 DIAGNOSIS — M25561 Pain in right knee: Secondary | ICD-10-CM | POA: Diagnosis not present

## 2024-01-09 DIAGNOSIS — M9902 Segmental and somatic dysfunction of thoracic region: Secondary | ICD-10-CM | POA: Diagnosis not present

## 2024-01-09 DIAGNOSIS — M62830 Muscle spasm of back: Secondary | ICD-10-CM | POA: Diagnosis not present

## 2024-01-09 DIAGNOSIS — M9905 Segmental and somatic dysfunction of pelvic region: Secondary | ICD-10-CM | POA: Diagnosis not present

## 2024-01-09 DIAGNOSIS — M9903 Segmental and somatic dysfunction of lumbar region: Secondary | ICD-10-CM | POA: Diagnosis not present

## 2024-01-12 ENCOUNTER — Ambulatory Visit
Admission: EM | Admit: 2024-01-12 | Discharge: 2024-01-12 | Disposition: A | Discharge status: Home / Self Care | Attending: Family Medicine | Admitting: Family Medicine

## 2024-01-12 DIAGNOSIS — J208 Acute bronchitis due to other specified organisms: Secondary | ICD-10-CM

## 2024-01-12 DIAGNOSIS — J4541 Moderate persistent asthma with (acute) exacerbation: Secondary | ICD-10-CM | POA: Diagnosis not present

## 2024-01-12 MED ORDER — BENZONATATE 200 MG PO CAPS: 200.0000 mg | ORAL_CAPSULE | Freq: Three times a day (TID) | ORAL | 0 refills | Status: DC | PRN

## 2024-01-12 MED ORDER — AZELASTINE HCL 0.1 % NA SOLN: 1.0000 | Freq: Two times a day (BID) | NASAL | 0 refills | Status: DC

## 2024-01-12 NOTE — ED Provider Notes (Signed)
 [facility]    CSN: [id number] Arrival date & time: 01/12/24  1030      History   Chief Complaint Chief Complaint  Patient presents with   Cough   Nasal Congestion    HPI Laura Carter is a 83 y.o. female.   Presenting today with 3-day history of cough, congestion and a runny nose, wheezing, scratchy throat.  Denies fever, chills, chest pain, shortness of breath, abdominal pain, vomiting, diarrhea.  Taking zinc, vitamin C, Echinacea and using her albuterol  inhaler for her asthma with mild temporary relief.  States she is prone to bronchitis.    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 needle biopsy only   CHOLECYSTECTOMY  10/10   COLONOSCOPY     ESOPHAGOGASTRODUODENOSCOPY     EYE SURGERY     cataract right   KNEE ARTHROSCOPY WITH MEDIAL MENISECTOMY Left 06/16/2015   Procedure: KNEE ARTHROSCOPY WITH PARTIAL MEDIAL MENISECTOMY;  Surgeon: Taft FORBES Minerva, MD;  Location: [facility];  Service: Orthopedics;  Laterality: Left;   PARTIAL KNEE ARTHROPLASTY Left 07/08/2017   Procedure: UNICOMPARTMENTAL KNEE;  Surgeon: Beverley Evalene BIRCH, MD;  Location: [facility];  Service: Orthopedics;  Laterality:  Left;    OB History   No obstetric history on file.      Home Medications    Prior to Admission medications   Medication Sig Start Date End Date Taking? Authorizing Provider  albuterol  (VENTOLIN  HFA) 108 (90 Base) MCG/ACT inhaler Inhale 2 puffs into the lungs every 6 (six) hours as needed. 08/07/23  Yes Leath-Warren, Etta PARAS, NP  aspirin  EC 81 MG tablet Take 1 tablet (81 mg total) by mouth daily. 11/18/17  Yes Hagler, Vernell, MD  azelastine  (ASTELIN ) 0.1 % nasal spray Place 1 spray into both nostrils 2 (two) times daily. Use in each nostril as directed 01/12/24  Yes Stuart Vernell Norris, PA-C  benzonatate  (TESSALON ) 200 MG capsule Take 1 capsule (200 mg total) by mouth 3 (three) times daily as needed for cough. 01/12/24  Yes Stuart Vernell Norris, PA-C  ferrous sulfate  325 (65 FE) MG tablet Take 1 tablet (325 mg total) by mouth daily with breakfast. 02/24/23  Yes Kennedy-Smith, Colleen M, NP  metoprolol  succinate (TOPROL -XL) 25 MG 24 hr tablet TAKE (1) TABLET BY MOUTH ONCE DAILY. 06/30/17  Yes Duanne Butler DASEN, MD  Multiple Vitamins-Minerals (ZINC PO) Take 1 tablet by mouth daily as needed (for immune health/onset of cold).   Yes [provider]  ADVAIR DISKUS 100-50 MCG/ACT AEPB 1 puff 2 (two) times daily. Patient not taking: Reported on 02/19/2023 05/04/21   [provider]  benzonatate  (TESSALON ) 100 MG capsule  Take 1 capsule (100 mg total) by mouth every 8 (eight) hours. 08/07/23   Leath-Warren, Etta PARAS, NP  Cholecalciferol (VITAMIN D3 PO) Take 1 tablet by mouth daily. 2000 units    [provider]  Cyanocobalamin  (VITAMIN B-12 PO) Take 1 tablet by mouth daily.    [provider]  esomeprazole (NEXIUM) 20 MG capsule Take 20 mg by mouth daily at 12 noon.    [provider]  fluticasone  (FLONASE ) 50 MCG/ACT nasal spray Place 2 sprays into both nostrils daily. 08/07/23   Leath-Warren, Etta PARAS, NP  Turmeric 500 MG CAPS Take by mouth.     [provider]    Family History Family History  Problem Relation Age of Onset   Cancer Mother        breast   Stomach cancer Mother    Breast cancer Mother    Heart disease Father    Heart disease Sister    Breast cancer Sister     Social History Social History   Tobacco Use   Smoking status: Former    Current packs/day: 0.00    Average packs/day: 1 pack/day for 10.0 years (10.0 ttl pk-yrs)    Types: Cigarettes    Start date: 06/14/1955    Quit date: 06/13/1965    Years since quitting: 58.6   Smokeless tobacco: Never  Vaping Use   Vaping status: Never Used  Substance Use Topics   Alcohol use: No   Drug use: No     Allergies   Patient has no known allergies.   Review of Systems Review of Systems Per HPI  Physical Exam Triage Vital Signs ED Triage Vitals  Encounter Vitals Group     BP 01/12/24 1048 134/85     Girls Systolic BP Percentile --      Girls Diastolic BP Percentile --      Boys Systolic BP Percentile --      Boys Diastolic BP Percentile --      Pulse Rate 01/12/24 1048 66     Resp 01/12/24 1048 18     Temp 01/12/24 1048 98 F (36.7 C)     Temp Source 01/12/24 1048 Oral     SpO2 01/12/24 1048 94 %     Weight --      Height --      Head Circumference --      Peak Flow --      Pain Score 01/12/24 1050 0     Pain Loc --      Pain Education --      Exclude from Growth Chart --    No data found.  Updated Vital Signs BP 134/85 (BP Location: Right Arm)   Pulse 66   Temp 98 F (36.7 C) (Oral)   Resp 18   SpO2 94%   Visual Acuity Right Eye Distance:   Left Eye Distance:   Bilateral Distance:    Right Eye Near:   Left Eye Near:    Bilateral Near:     Physical Exam Vitals and nursing note reviewed.  Constitutional:      Appearance: Normal appearance.  HENT:     Head: Atraumatic.     Right Ear: Tympanic membrane and external ear normal.     Left Ear: Tympanic membrane and external ear normal.     Nose: Rhinorrhea  present.     Mouth/Throat:     Mouth: Mucous membranes are moist.     Pharynx: Posterior oropharyngeal erythema present.  Eyes:     Extraocular Movements: Extraocular movements intact.     Conjunctiva/sclera: Conjunctivae normal.  Cardiovascular:     Rate and Rhythm: Normal rate and regular rhythm.     Heart sounds: Normal heart sounds.  Pulmonary:     Effort: Pulmonary effort is normal.     Breath sounds: Wheezing present. No rales.     Comments: Trace wheezes bilateral bases Musculoskeletal:        General: Normal range of motion.     Cervical back: Normal range of motion and neck supple.  Skin:    General: Skin is warm and dry.  Neurological:     Mental Status: She is alert and oriented to person, place, and time.  Psychiatric:        Mood and Affect: Mood normal.        Thought Content: Thought content normal.      UC Treatments / Results  Labs (all labs ordered are listed, but only abnormal results are displayed) Labs Reviewed - No data to display  EKG   Radiology No results found.  Procedures Procedures (including critical care time)  Medications Ordered in UC Medications - No data to display  Initial Impression / Assessment and Plan / UC Course  I have reviewed the triage vital signs and the nursing notes.  Pertinent labs & imaging results that were available during my care of the patient were reviewed by me and considered in my medical decision making (see chart for details).     Suspect viral bronchitis causing mild asthma exacerbation.  She declines oral steroids, will restart Advair consistently twice daily, albuterol  every 4 hours as needed, Tessalon , Astelin , supportive home care.  Return for worsening symptoms.  Final Clinical Impressions(s) / UC Diagnoses   Final diagnoses:  Viral bronchitis  Moderate persistent asthma with exacerbation     Discharge Instructions      Use the Advair twice daily consistently, albuterol  inhaler 2 puffs  every 4 hours as needed for the wheezing and chest tightness and I have prescribed a good nasal spray and cough medication additionally.  Stay well-hydrated, keep taking good deep breaths and coughing up as much mucus as possible.  Follow-up for worsening or unresolving symptoms.    ED Prescriptions     Medication Sig Dispense Auth. Provider   benzonatate  (TESSALON ) 200 MG capsule Take 1 capsule (200 mg total) by mouth 3 (three) times daily as needed for cough. 20 capsule Stuart Vernell Norris, PA-C   azelastine  (ASTELIN ) 0.1 % nasal spray Place 1 spray into both nostrils 2 (two) times daily. Use in each nostril as directed 30 mL Stuart Vernell Norris, PA-C      PDMP not reviewed this encounter.   Bernese, [place], [place] 01/12/24 1143

## 2024-01-12 NOTE — ED Triage Notes (Signed)
 Cough, congestion, runny nose, x 3 days. Taking zinc, vit c.

## 2024-01-12 NOTE — Discharge Instructions (Signed)
 Use the Advair twice daily consistently, albuterol  inhaler 2 puffs every 4 hours as needed for the wheezing and chest tightness and I have prescribed a good nasal spray and cough medication additionally.  Stay well-hydrated, keep taking good deep breaths and coughing up as much mucus as possible.  Follow-up for worsening or unresolving symptoms.

## 2024-01-13 DIAGNOSIS — M79652 Pain in left thigh: Secondary | ICD-10-CM | POA: Diagnosis not present

## 2024-01-21 DIAGNOSIS — Z01818 Encounter for other preprocedural examination: Secondary | ICD-10-CM | POA: Diagnosis not present

## 2024-01-28 DIAGNOSIS — Z01818 Encounter for other preprocedural examination: Secondary | ICD-10-CM | POA: Diagnosis not present

## 2024-01-28 DIAGNOSIS — I700 Atherosclerosis of aorta: Secondary | ICD-10-CM | POA: Diagnosis not present

## 2024-01-28 DIAGNOSIS — I771 Stricture of artery: Secondary | ICD-10-CM | POA: Diagnosis not present

## 2024-01-30 ENCOUNTER — Ambulatory Visit: Admitting: Family Medicine

## 2024-02-05 ENCOUNTER — Other Ambulatory Visit: Payer: Self-pay | Admitting: Family Medicine

## 2024-02-05 DIAGNOSIS — Z1231 Encounter for screening mammogram for malignant neoplasm of breast: Secondary | ICD-10-CM

## 2024-02-06 DIAGNOSIS — M62830 Muscle spasm of back: Secondary | ICD-10-CM | POA: Diagnosis not present

## 2024-02-06 DIAGNOSIS — M9905 Segmental and somatic dysfunction of pelvic region: Secondary | ICD-10-CM | POA: Diagnosis not present

## 2024-02-06 DIAGNOSIS — M9902 Segmental and somatic dysfunction of thoracic region: Secondary | ICD-10-CM | POA: Diagnosis not present

## 2024-02-06 DIAGNOSIS — M9903 Segmental and somatic dysfunction of lumbar region: Secondary | ICD-10-CM | POA: Diagnosis not present

## 2024-02-16 DIAGNOSIS — M25561 Pain in right knee: Secondary | ICD-10-CM | POA: Diagnosis not present

## 2024-02-16 NOTE — Progress Notes (Signed)
 Sent message, via epic in basket, requesting orders in epic from Careers adviser.

## 2024-02-17 DIAGNOSIS — H00015 Hordeolum externum left lower eyelid: Secondary | ICD-10-CM | POA: Diagnosis not present

## 2024-02-19 NOTE — Progress Notes (Addendum)
 Anesthesia Review:  PCP: Vernell Fort  LOV 12/23/23  Cardiologist : none   PPM/ ICD: Device Orders: Rep Notified:  Chest x-ray : EKG : 02/23/24  Echo : Stress test: Cardiac Cath :   Activity level: can do a flight of stairs wtihout difficutly  Sleep Study/ CPAP : has cpap  Fasting Blood Sugar :      / Checks Blood Sugar -- times a day:    Blood Thinner/ Instructions /Last Dose: ASA / Instructions/ Last Dose :    81 mg aspirin  - pt to check with DR Beverley in regards to aspirin  preop instructions.     [facility]12/24- [facility] with  Viral Bronchitis.  Has been followed by PCP for this.  PT with no symptoms at preop on 02/23/24.    Pt was  20 minutes    late for preop appt.    Husband with pt at preop appt.  Pt came into preop thinking she was to stop all meds priro to surgery.  Instructed to she is to stop vitamins and herbal supplements 7 days prior to surgery.  PT is to stop any other meds per surgeon she is to stop but should continue blood pressure meds.  PT did not take blooe pressure med prior to preop appt  Again, instructed pt to follow instructions given to pt by surgeon if any meds she should stop prior to surgery and when and no vitamins or herbal supplements for 7 days prior to surgery.  Highlighted instructions with vital info.  The word  Shower written on top of hibiclens  bottles and the word  Drink circles on the Ensure presurgery drink.  PT voiced understanding before leaving preop appt.  PT has phone number for preop nurse if any questions.     PT did not take meds prior to preop appt.  PT aware to go home and takes meds as usual.  PT voiced understanding.    Pt called and LVMM on 02/27/2024.  PT had 3 questions-  1- in regards to sheets-  2- in regards to wearing pajamas to hosp on DOS  3- Aspirin -  Pt stated she would not be available at certain times today.   Called pt back on 02/27/2024 and LVMM and told pt she did not have to change sheets every day unless she  wanted to and to make sure sheets changed on 02/27/2024, she can wear pajamas to hospital if she wants and aspirin  instructons come from surgeon.  Informed pt that I would call her back around 3 today.   Called pt at 1530pm on 02/27/24 and pt stated she had been to see Dr Fort today  For sneezing, and some congestion.  PT stated she tested negative for covid and flu.  PT has been placed on lemon tea, zinc and vitamin c and robitussin.  PT stated on 02/26/24 she made Dr Beverley aware of symptoms.  She is to call them on 03/01/2024 with update.     Pt calleed in on 03/01/2024 with cough and congestion.  Yellow phlegm.  NO temp.  [place] at [place] office has been notified per pt.  Surgery to be postponed.

## 2024-02-19 NOTE — Patient Instructions (Signed)
 SURGICAL WAITING ROOM VISITATION  Patients having surgery or a procedure may have no more than 2 support people in the waiting area - these visitors may rotate.    Children under the age of 39 must have an adult with them who is not the patient.  Visitors with respiratory illnesses are discouraged from visiting and should remain at home.  If the patient needs to stay at the hospital during part of their recovery, the visitor guidelines for inpatient rooms apply. Pre-op nurse will coordinate an appropriate time for 1 support person to accompany patient in pre-op.  This support person may not rotate.    Please refer to the [facility] website for the visitor guidelines for Inpatients (after your surgery is over and you are in a regular room).       Your procedure is scheduled on:  03/02/2024    Report to [facility] Main Entrance    Report to admitting at  0515 AM   Call this number if you have problems the morning of surgery [phone]   Do not eat food :After Midnight.   After Midnight you may have the following liquids until _ 0430_____ AM/ PM DAY OF SURGERY  Water Non-Citrus Juices (without pulp, NO RED-Apple, White grape, White cranberry) Black Coffee (NO MILK/CREAM OR CREAMERS, sugar ok)  Clear Tea (NO MILK/CREAM OR CREAMERS, sugar ok) regular and decaf                             Plain Jell-O (NO RED)                                           Fruit ices (not with fruit pulp, NO RED)                                     Popsicles (NO RED)                                                               Sports drinks like Gatorade (NO RED)                   The day of surgery:  Drink ONE (1) Pre-Surgery Clear Ensure or G2 at  0430AM the morning of surgery. Drink in one sitting. Do not sip.  This drink was given to you during your hospital  pre-op appointment visit. Nothing else to drink after completing the  Pre-Surgery Clear Ensure or G2.          If you have  questions, please contact your surgeon's office.      Oral Hygiene is also important to reduce your risk of infection.                                    Remember - BRUSH YOUR TEETH THE MORNING OF SURGERY WITH YOUR REGULAR TOOTHPASTE  DENTURES WILL BE REMOVED PRIOR TO SURGERY PLEASE DO NOT APPLY Poly grip OR ADHESIVES!!!  Do NOT smoke after Midnight   Stop all vitamins and herbal supplements 7 days before surgery.   Take these medicines the morning of surgery with A SIP OF WATER:  inhalers as usual and bring, nasal spray, nexium, torpol, flonase    DO NOT TAKE ANY ORAL DIABETIC MEDICATIONS DAY OF YOUR SURGERY  Bring CPAP mask and tubing day of surgery.                              You may not have any metal on your body including hair pins, jewelry, and body piercing             Do not wear make-up, lotions, powders, perfumes/cologne, or deodorant  Do not wear nail polish including gel and S&S, artificial/acrylic nails, or any other type of covering on natural nails including finger and toenails. If you have artificial nails, gel coating, etc. that needs to be removed by a nail salon please have this removed prior to surgery or surgery may need to be canceled/ delayed if the surgeon/ anesthesia feels like they are unable to be safely monitored.   Do not shave  48 hours prior to surgery.               Men may shave face and neck.   Do not bring valuables to the hospital. [facility] IS NOT             RESPONSIBLE   FOR VALUABLES.   Contacts, glasses, dentures or bridgework may not be worn into surgery.   Bring small overnight bag day of surgery.   DO NOT BRING YOUR HOME MEDICATIONS TO THE HOSPITAL. PHARMACY WILL DISPENSE MEDICATIONS LISTED ON YOUR MEDICATION LIST TO YOU DURING YOUR ADMISSION IN THE HOSPITAL!    Patients discharged on the day of surgery will not be allowed to drive home.  Someone NEEDS to stay with you for the first 24 hours after anesthesia.   Special  Instructions: Bring a copy of your healthcare power of attorney and living will documents the day of surgery if you haven't scanned them before.              Please read over the following fact sheets you were given: IF YOU HAVE QUESTIONS ABOUT YOUR PRE-OP INSTRUCTIONS PLEASE CALL [phone].   If you received a COVID test during your pre-op visit  it is requested that you wear a mask when out in public, stay away from anyone that may not be feeling well and notify your surgeon if you develop symptoms. If you test positive for Covid or have been in contact with anyone that has tested positive in the last 10 days please notify you surgeon.      Pre-operative 5 CHG Bath Instructions   You can play a key role in reducing the risk of infection after surgery. Your skin needs to be as free of germs as possible. You can reduce the number of germs on your skin by washing with CHG (chlorhexidine  gluconate) soap before surgery. CHG is an antiseptic soap that kills germs and continues to kill germs even after washing.   DO NOT use if you have an allergy to chlorhexidine /CHG or antibacterial soaps. If your skin becomes reddened or irritated, stop using the CHG and notify one of our RNs at [phone].   Please shower with the CHG soap starting 4 days before surgery using the following schedule:  Please keep in mind the following:  DO NOT shave, including legs and underarms, starting the day of your first shower.   You may shave your face at any point before/day of surgery.  Place clean sheets on your bed the day you start using CHG soap. Use a clean washcloth (not used since being washed) for each shower. DO NOT sleep with pets once you start using the CHG.   CHG Shower Instructions:  If you choose to wash your hair and private area, wash first with your normal shampoo/soap.  After you use shampoo/soap, rinse your hair and body thoroughly to remove shampoo/soap residue.  Turn the water OFF and  apply about 3 tablespoons (45 ml) of CHG soap to a CLEAN washcloth.  Apply CHG soap ONLY FROM YOUR NECK DOWN TO YOUR TOES (washing for 3-5 minutes)  DO NOT use CHG soap on face, private areas, open wounds, or sores.  Pay special attention to the area where your surgery is being performed.  If you are having back surgery, having someone wash your back for you may be helpful. Wait 2 minutes after CHG soap is applied, then you may rinse off the CHG soap.  Pat dry with a clean towel  Put on clean clothes/pajamas   If you choose to wear lotion, please use ONLY the CHG-compatible lotions on the back of this paper.     Additional instructions for the day of surgery: DO NOT APPLY any lotions, deodorants, cologne, or perfumes.   Put on clean/comfortable clothes.  Brush your teeth.  Ask your nurse before applying any prescription medications to the skin.      CHG Compatible Lotions   Aveeno Moisturizing lotion  Cetaphil Moisturizing Cream  Cetaphil Moisturizing Lotion  Clairol Herbal Essence Moisturizing Lotion, Dry Skin  Clairol Herbal Essence Moisturizing Lotion, Extra Dry Skin  Clairol Herbal Essence Moisturizing Lotion, Normal Skin  Curel Age Defying Therapeutic Moisturizing Lotion with Alpha Hydroxy  Curel Extreme Care Body Lotion  Curel Soothing Hands Moisturizing Hand Lotion  Curel Therapeutic Moisturizing Cream, Fragrance-Free  Curel Therapeutic Moisturizing Lotion, Fragrance-Free  Curel Therapeutic Moisturizing Lotion, Original Formula  Eucerin Daily Replenishing Lotion  Eucerin Dry Skin Therapy Plus Alpha Hydroxy Crme  Eucerin Dry Skin Therapy Plus Alpha Hydroxy Lotion  Eucerin Original Crme  Eucerin Original Lotion  Eucerin Plus Crme Eucerin Plus Lotion  Eucerin TriLipid Replenishing Lotion  Keri Anti-Bacterial Hand Lotion  Keri Deep Conditioning Original Lotion Dry Skin Formula Softly Scented  Keri Deep Conditioning Original Lotion, Fragrance Free Sensitive Skin  Formula  Keri Lotion Fast Absorbing Fragrance Free Sensitive Skin Formula  Keri Lotion Fast Absorbing Softly Scented Dry Skin Formula  Keri Original Lotion  Keri Skin Renewal Lotion Keri Silky Smooth Lotion  Keri Silky Smooth Sensitive Skin Lotion  Nivea Body Creamy Conditioning Oil  Nivea Body Extra Enriched Teacher, adult education Moisturizing Lotion Nivea Crme  Nivea Skin Firming Lotion  NutraDerm 30 Skin Lotion  NutraDerm Skin Lotion  NutraDerm Therapeutic Skin Cream  NutraDerm Therapeutic Skin Lotion  ProShield Protective Hand Cream  Provon moisturizing lotion

## 2024-02-23 ENCOUNTER — Encounter (HOSPITAL_COMMUNITY): Payer: Self-pay

## 2024-02-23 ENCOUNTER — Other Ambulatory Visit: Payer: Self-pay

## 2024-02-23 ENCOUNTER — Encounter (HOSPITAL_COMMUNITY)
Admission: RE | Admit: 2024-02-23 | Discharge: 2024-02-23 | Disposition: A | Discharge status: Home / Self Care | Source: Ambulatory Visit | Attending: Orthopedic Surgery | Admitting: Orthopedic Surgery

## 2024-02-23 VITALS — BP 144/91 | HR 63 | Temp 98.2°F | Resp 16 | Ht 63.5 in | Wt 182.0 lb

## 2024-02-23 DIAGNOSIS — Z01818 Encounter for other preprocedural examination: Secondary | ICD-10-CM | POA: Diagnosis not present

## 2024-02-23 HISTORY — DX: Sleep apnea, unspecified: G47.30

## 2024-02-23 HISTORY — DX: Malignant (primary) neoplasm, unspecified: C80.1

## 2024-02-23 LAB — BASIC METABOLIC PANEL WITH GFR
Anion gap: 11 (ref 5–15)
BUN: 18 mg/dL (ref 8–23)
CO2: 23 mmol/L (ref 22–32)
Calcium: 9.9 mg/dL (ref 8.9–10.3)
Chloride: 104 mmol/L (ref 98–111)
Creatinine, Ser: 0.8 mg/dL (ref 0.44–1.00)
GFR, Estimated: >60 mL/min (ref >60)
Glucose, Bld: 106 mg/dL — ABNORMAL HIGH (ref 70–99)
Potassium: 4.2 mmol/L (ref 3.5–5.1)
Sodium: 139 mmol/L (ref 135–145)

## 2024-02-23 LAB — CBC
HCT: 44.1 % (ref 36.0–46.0)
Hemoglobin: 13.9 g/dL (ref 12.0–15.0)
MCH: 28.9 pg (ref 26.0–34.0)
MCHC: 31.5 g/dL (ref 30.0–36.0)
MCV: 91.7 fL (ref 80.0–100.0)
Platelets: 281 K/uL (ref 150–400)
RBC: 4.81 MIL/uL (ref 3.87–5.11)
RDW: 15.6 % — ABNORMAL HIGH (ref 11.5–15.5)
WBC: 5.8 K/uL (ref 4.0–10.5)
nRBC: 0 % (ref 0.0–0.2)

## 2024-02-23 LAB — SURGICAL PCR SCREEN
MRSA, PCR: NEGATIVE
Staphylococcus aureus: NEGATIVE

## 2024-02-23 NOTE — H&P (Incomplete)
 KNEE ARTHROPLASTY ADMISSION H&P  Patient ID: Laura Carter MRN: [id number] DOB/AGE: 84-Mar-1942 84 y.o.  Chief Complaint: right knee pain.  Planned Procedure Date: 03/02/24 Medical Clearance by ***   Cardiac Clearance by *** Additional clearance by ***   HPI: Laura Carter is a 83 y.o. female who presents for evaluation of OA RIGHT KNEE. The patient has a history of pain and functional disability in the {Left/right:3049041} knee due to arthritis and has failed non-surgical conservative treatments for greater than 12 weeks to include {nonsurgical conservative treatment (must select two):3049030}.  Onset of symptoms was {abrupt, gradual:20671}, starting {1->10 years:3049031} years ago with {stable, gradual worsening, rapidly worsening:3049032} course since that time. The patient noted {no past surgery, prior procedures:3049033} on the {Left/right:3049041} knee.  Patient currently rates pain at {1-10:3049035} out of 10 with activity. Patient has {night pain, worsening of pain with activity and weight bearing:3049036}.  Patient has evidence of {Radiographic or MRI evidence of (must document one of the below):3046104} by imaging studies.  There is no active infection.  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 needle biopsy only   CHOLECYSTECTOMY  02/2009   COLONOSCOPY     ESOPHAGOGASTRODUODENOSCOPY     EYE SURGERY     bilateral   KNEE ARTHROSCOPY WITH MEDIAL MENISECTOMY Left 06/16/2015   Procedure: KNEE ARTHROSCOPY WITH PARTIAL MEDIAL MENISECTOMY;  Surgeon: Taft FORBES Minerva, MD;  Location: [facility];  Service: Orthopedics;  Laterality: Left;   PARTIAL KNEE ARTHROPLASTY Left 07/08/2017   Procedure:  UNICOMPARTMENTAL KNEE;  Surgeon: Beverley Evalene BIRCH, MD;  Location: [facility] OR;  Service: Orthopedics;  Laterality: Left;   No Known Allergies Prior to Admission medications   Medication Sig Start Date End Date Taking? Authorizing Provider  acetaminophen  (TYLENOL ) 500 MG tablet Take 1,000 mg by mouth every 6 (six) hours as needed for moderate pain (pain score 4-6).   Yes [provider]  albuterol  (VENTOLIN  HFA) 108 (90 Base) MCG/ACT inhaler Inhale 2 puffs into the lungs every 6 (six) hours as needed. 08/07/23  Yes Leath-Warren, Etta PARAS, NP  ALLERGY RELIEF 10 MG tablet Take 10 mg by mouth daily as needed for allergies. 12/26/23  Yes [provider]  APPLE CIDER VINEGAR PO Take 15 mLs by mouth daily.   Yes [provider]  ascorbic acid (VITAMIN C) 500 MG tablet Take 1,000 mg by mouth daily.   Yes [provider]  ASHWAGANDHA PO Take 2 tablets by mouth daily.   Yes [provider]  aspirin  EC 81 MG tablet Take 1 tablet (81 mg total) by mouth daily. Patient taking differently: Take 81 mg by mouth at bedtime. 11/18/17  Yes Hagler, Vernell, MD  azelastine  (ASTELIN ) 0.1 % nasal spray Place 1 spray into both nostrils 2 (two) times daily. Use in each nostril as directed Patient taking differently: Place 1 spray into both nostrils 2 (two) times daily as needed for allergies or rhinitis. 01/12/24  Yes Stuart Vernell Norris, PA-C  b complex vitamins capsule Take 2 capsules by mouth daily.   Yes [provider]  Cholecalciferol (VITAMIN D3) 125  MCG (5000 UT) TABS Take 5,000 Units by mouth daily.   Yes [provider]  esomeprazole (NEXIUM) 20 MG capsule Take 20 mg by mouth daily at 12 noon.   Yes [provider]  metoprolol  succinate (TOPROL -XL) 25 MG 24 hr tablet TAKE (1) TABLET BY MOUTH ONCE DAILY. 06/30/17  Yes Duanne Butler DASEN, MD  NON FORMULARY Pt uses a cpap nightly   Yes [provider]  benzonatate  (TESSALON ) 100 MG capsule Take  1 capsule (100 mg total) by mouth every 8 (eight) hours. Patient not taking: Reported on 02/20/2024 08/07/23   Leath-Warren, Etta PARAS, NP  benzonatate  (TESSALON ) 200 MG capsule Take 1 capsule (200 mg total) by mouth 3 (three) times daily as needed for cough. Patient not taking: Reported on 02/20/2024 01/12/24   Stuart Vernell Norris, PA-C  ferrous sulfate  325 (65 FE) MG tablet Take 1 tablet (325 mg total) by mouth daily with breakfast. 02/24/23   Kennedy-Smith, Colleen M, NP  fluticasone  (FLONASE ) 50 MCG/ACT nasal spray Place 2 sprays into both nostrils daily. Patient not taking: Reported on 02/20/2024 08/07/23   Leath-Warren, Etta PARAS, NP   Social History   Socioeconomic History   Marital status: Married    Spouse name: Not on file   Number of children: 2   Years of education: Not on file   Highest education level: 10th grade  Occupational History   Not on file  Tobacco Use   Smoking status: Former    Current packs/day: 0.00    Average packs/day: 1 pack/day for 10.0 years (10.0 ttl pk-yrs)    Types: Cigarettes    Start date: 06/14/1955    Quit date: 06/13/1965    Years since quitting: 58.7   Smokeless tobacco: Never  Vaping Use   Vaping status: Never Used  Substance and Sexual Activity   Alcohol use: No   Drug use: No   Sexual activity: Never    Birth control/protection: None  Other Topics Concern   Not on file  Social History Narrative   Grew up in [place], [place]. Married. Has one daughter and one son. Has two grandchildren.    Did not work outside the home.   Eats all food groups.    Wear seatbelt.    Went to [employer] and graduated from [employer] in [place].    Social Drivers of Corporate investment banker Strain: Not on file  Food Insecurity: Not on file  Transportation Needs: Not on file  Physical Activity: Not on file  Stress: Not on file  Social Connections: Not on file   Family History  Problem Relation Age of Onset   Cancer Mother         breast   Stomach cancer Mother    Breast cancer Mother    Heart disease Father    Heart disease Sister    Breast cancer Sister     ROS: Currently denies lightheadedness, dizziness, Fever, chills, CP, SOB.***   No personal history of DVT, PE, MI, or CVA.*** No loose teeth or dentures*** All other systems have been reviewed and were otherwise currently negative with the exception of those mentioned in the HPI and as above.  Objective: Vitals: Ht: *** Wt: *** lbs Temp: *** BP: *** Pulse: *** O2 ***% on room air.   Physical Exam: General: Alert, NAD.  Antalgic Gait *** HEENT: EOMI, Good Neck Extension *** Pulm: No increased work of breathing.  Clear B/L A/P w/o crackle or wheeze. *** CV: RRR,  No m/g/r appreciated *** GI: soft, NT, ND. BS x 4 quadrants Neuro: CN II-XII grossly intact without focal deficit.  Sensation intact distally Skin: No lesions in the area of chief complaint MSK/Surgical Site: *** knee w/o redness or effusion.  *** JLT. ROM ***.  5/5 strength in extension and flexion.  +EHL/FHL.  NVI.  Stable varus and valgus stress.    Imaging Review Plain radiographs demonstrate {mild/mod/severe:3049053} degenerative joint disease of the {left/right/bi:30031} knee.   The overall alignment is{Neutral/varus:3049054}. The bone quality appears to be {good/fair/poor/excellent:33178} for age and reported activity level.  Preoperative templating of the joint replacement has been completed, documented, and submitted to the Operating Room personnel in order to optimize intra-operative equipment management.  Assessment: OA RIGHT KNEE Active Problems:   * No active hospital problems. *   Plan: Plan for Procedure(s): ARTHROPLASTY, KNEE, TOTAL  The patient history, physical exam, clinical judgement of the provider and imaging are consistent with end stage degenerative joint disease and *** joint arthroplasty is deemed medically necessary. The treatment options including medical  management, injection therapy, and arthroplasty were discussed at length. The risks and benefits of Procedure(s): ARTHROPLASTY, KNEE, TOTAL were presented and reviewed.  The risks of nonoperative treatment, versus surgical intervention including but not limited to continued pain, aseptic loosening, stiffness, dislocation/subluxation, infection, bleeding, nerve injury, blood clots, cardiopulmonary complications, morbidity, mortality, among others were discussed. The patient verbalizes understanding and wishes to proceed with the plan.  Patient is being admitted for inpatient treatment for surgery, pain control, PT, prophylactic antibiotics, VTE prophylaxis, progressive ambulation, ADL's and discharge planning.   Dental prophylaxis discussed and recommended for 2 years postoperatively.***  The patient does not*** meet the criteria for TXA which will be used perioperatively.   ASA 81 mg BID ***ASA 325 mg ***Xarelto  will be used postoperatively for DVT prophylaxis in addition to SCDs, and early ambulation. Plan for ***Tylenol , Celebrex , oxycodone  for pain.   Robaxin ***Baclofen  for muscle spasms.   Zofran  for nausea and vomiting. *** Colace Miralax  Senokot is for constipation prevention. Pharmacy- *** The patient is planning to be discharged home with OPPT*** HHPT*** and into the care of *** who can be reached at *** Follow up appt ***  {ORTHOADMISSIONSTATUS:21269}    Gerard CHRISTELLA Ted DEVONNA Office [phone] 02/23/2024 6:49 PM

## 2024-02-24 NOTE — Anesthesia Preprocedure Evaluation (Signed)
 Anesthesia Evaluation    Airway        Dental   Pulmonary former smoker          Cardiovascular hypertension,      Neuro/Psych    GI/Hepatic   Endo/Other    Renal/GU      Musculoskeletal   Abdominal   Peds  Hematology   Anesthesia Other Findings   Reproductive/Obstetrics                              Anesthesia Physical Anesthesia Plan  ASA:   Anesthesia Plan:    Post-op Pain Management:    Induction:   PONV Risk Score and Plan:   Airway Management Planned:   Additional Equipment:   Intra-op Plan:   Post-operative Plan:   Informed Consent:   Plan Discussed with:   Anesthesia Plan Comments: (83 yo with pmh of HTN, OSA (uses CPAP), asthma, GERD, hiatal hernia, lumbar spinal stenosis, arthritis. Labs and EKG WNL.)         Anesthesia Quick Evaluation

## 2024-02-26 ENCOUNTER — Ambulatory Visit
Admission: RE | Admit: 2024-02-26 | Discharge: 2024-02-26 | Disposition: A | Source: Ambulatory Visit | Attending: Family Medicine

## 2024-02-26 DIAGNOSIS — Z1231 Encounter for screening mammogram for malignant neoplasm of breast: Secondary | ICD-10-CM

## 2024-02-26 NOTE — Care Plan (Signed)
;  Ortho Bundle Case Management Note  Patient Details  Name: Laura Carter MRN: [id number] Date of Birth: 1941-03-12  met with patient in the office for H&P. will discharge to home with family to assist. rolling walker and CPM ordered for home use. OPPT set up with [facility]. discharge instructions discussed and questions answered. Patient and MD in agreement. Choice offered.                    DME Arranged:  Vannie rolling, CPM DME Agency:  [facility]  HH Arranged:    HH Agency:     Additional Comments: Please contact me with any questions of if this plan should need to change.  Charlies Pitch,  RN,BSN,MHA,CCM  [facility] Orthopaedic Specialist  [phone] 02/26/2024, 2:02 PM

## 2024-02-27 DIAGNOSIS — B349 Viral infection, unspecified: Secondary | ICD-10-CM | POA: Diagnosis not present

## 2024-03-02 ENCOUNTER — Encounter (HOSPITAL_COMMUNITY): Admission: RE | Payer: Self-pay | Source: Ambulatory Visit

## 2024-03-02 ENCOUNTER — Encounter (HOSPITAL_COMMUNITY): Payer: Self-pay | Admitting: Anesthesiology

## 2024-03-02 ENCOUNTER — Encounter (HOSPITAL_COMMUNITY): Payer: Self-pay | Admitting: Physician Assistant

## 2024-03-02 ENCOUNTER — Ambulatory Visit (HOSPITAL_COMMUNITY): Admission: RE | Admit: 2024-03-02 | Source: Ambulatory Visit | Admitting: Orthopedic Surgery

## 2024-03-02 SURGERY — ARTHROPLASTY, KNEE, TOTAL
Anesthesia: Spinal | Site: Knee | Laterality: Right

## 2024-03-05 ENCOUNTER — Ambulatory Visit (HOSPITAL_COMMUNITY)

## 2024-03-06 ENCOUNTER — Ambulatory Visit
Admission: EM | Admit: 2024-03-06 | Discharge: 2024-03-06 | Disposition: A | Discharge status: Home / Self Care | Attending: Family Medicine | Admitting: Family Medicine

## 2024-03-06 ENCOUNTER — Encounter: Payer: Self-pay | Admitting: Emergency Medicine

## 2024-03-06 DIAGNOSIS — J4541 Moderate persistent asthma with (acute) exacerbation: Secondary | ICD-10-CM

## 2024-03-06 DIAGNOSIS — J0100 Acute maxillary sinusitis, unspecified: Secondary | ICD-10-CM | POA: Diagnosis not present

## 2024-03-06 MED ORDER — AMOXICILLIN-POT CLAVULANATE 875-125 MG PO TABS: 1.0000 | ORAL_TABLET | Freq: Two times a day (BID) | ORAL | 0 refills | Status: DC

## 2024-03-06 MED ORDER — PREDNISONE 20 MG PO TABS: 40.0000 mg | ORAL_TABLET | Freq: Every day | ORAL | 0 refills | Status: DC

## 2024-03-06 NOTE — Discharge Instructions (Signed)
 In addition to doing your allergy and asthma regimen consistently, I have prescribed a course of antibiotics and prednisone  to help with your ongoing and worsening symptoms.  Saline sinus rinses, humidifiers, over-the-counter remedies as needed additionally.  Follow-up for worsening or unresolving symptoms.

## 2024-03-06 NOTE — ED Triage Notes (Signed)
 Chest congestion, cough, runny nose x 1.5 weeks.  COVID test on 10/3 was negative.  Has been taking robitussin.

## 2024-03-06 NOTE — ED Provider Notes (Signed)
 [facility]    CSN: [id number] Arrival date & time: 03/06/24  1035      History   Chief Complaint No chief complaint on file.   HPI Laura Carter is a 83 y.o. female.   Patient presenting today with about 10-day history of progressively worsening nasal congestion, sinus pain and pressure, cough, wheezing, chest tightness.  Denies fever, chest pain, abdominal pain, vomiting, diarrhea.  Home COVID test was negative on 02/27/2024.  History of asthma on Advair and albuterol  but states she has not been using her inhalers as she does not feel she is using them correctly.  Trying Robitussin with minimal relief.    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 needle biopsy only   CHOLECYSTECTOMY  02/2009   COLONOSCOPY     ESOPHAGOGASTRODUODENOSCOPY     EYE SURGERY     bilateral   KNEE ARTHROSCOPY WITH MEDIAL MENISECTOMY Left 06/16/2015   Procedure: KNEE ARTHROSCOPY WITH PARTIAL MEDIAL MENISECTOMY;  Surgeon: Taft FORBES Minerva, MD;  Location: [facility];  Service: Orthopedics;  Laterality: Left;   PARTIAL KNEE ARTHROPLASTY Left 07/08/2017   Procedure: UNICOMPARTMENTAL  KNEE;  Surgeon: Beverley Evalene BIRCH, MD;  Location: [facility];  Service: Orthopedics;  Laterality: Left;    OB History   No obstetric history on file.      Home Medications    Prior to Admission medications   Medication Sig Start Date End Date Taking? Authorizing Provider  amoxicillin -clavulanate (AUGMENTIN) 875-125 MG tablet Take 1 tablet by mouth every 12 (twelve) hours. 03/06/24  Yes Stuart Vernell Norris, PA-C  predniSONE  (DELTASONE ) 20 MG tablet Take 2 tablets (40 mg total) by mouth daily with breakfast. 03/06/24  Yes Stuart Vernell Norris, PA-C  acetaminophen  (TYLENOL ) 500 MG tablet Take 1,000 mg by mouth every 6 (six) hours as needed for moderate pain (pain score 4-6).    [provider]  albuterol  (VENTOLIN  HFA) 108 (90 Base) MCG/ACT inhaler Inhale 2 puffs into the lungs every 6 (six) hours as needed. 08/07/23   Leath-Warren, Etta PARAS, NP  ALLERGY RELIEF 10 MG tablet Take 10 mg by mouth daily as needed for allergies. 12/26/23   [provider]  APPLE CIDER VINEGAR PO Take 15 mLs by mouth daily.    [provider]  ascorbic acid (VITAMIN C) 500 MG tablet Take 1,000 mg by mouth daily.    [provider]  ASHWAGANDHA PO Take 2 tablets by mouth daily.    [provider]  aspirin  EC 81 MG tablet Take 1 tablet (81 mg total)  by mouth daily. Patient taking differently: Take 81 mg by mouth at bedtime. 11/18/17   Rolinda Millman, MD  azelastine  (ASTELIN ) 0.1 % nasal spray Place 1 spray into both nostrils 2 (two) times daily. Use in each nostril as directed Patient taking differently: Place 1 spray into both nostrils 2 (two) times daily as needed for allergies or rhinitis. 01/12/24   Stuart Millman Norris, PA-C  b complex vitamins capsule Take 2 capsules by mouth daily.    [provider]  benzonatate  (TESSALON ) 200 MG capsule Take 1 capsule (200 mg total) by mouth 3 (three) times daily as needed for cough. Patient not taking: Reported on  02/20/2024 01/12/24   Stuart Millman Norris, PA-C  esomeprazole (NEXIUM) 20 MG capsule Take 20 mg by mouth daily at 12 noon.    [provider]  fluticasone  (FLONASE ) 50 MCG/ACT nasal spray Place 2 sprays into both nostrils daily. Patient not taking: Reported on 02/20/2024 08/07/23   Leath-Warren, Etta PARAS, NP  metoprolol  succinate (TOPROL -XL) 25 MG 24 hr tablet TAKE (1) TABLET BY MOUTH ONCE DAILY. 06/30/17   Duanne Butler DASEN, MD  NON FORMULARY Pt uses a cpap nightly    [provider]    Family History Family History  Problem Relation Age of Onset   Cancer Mother        breast   Stomach cancer Mother    Breast cancer Mother    Heart disease Father    Heart disease Sister    Breast cancer Sister     Social History Social History   Tobacco Use   Smoking status: Former    Current packs/day: 0.00    Average packs/day: 1 pack/day for 10.0 years (10.0 ttl pk-yrs)    Types: Cigarettes    Start date: 06/14/1955    Quit date: 06/13/1965    Years since quitting: 58.7   Smokeless tobacco: Never  Vaping Use   Vaping status: Never Used  Substance Use Topics   Alcohol use: No   Drug use: No     Allergies   Patient has no known allergies.   Review of Systems Review of Systems Per HPI  Physical Exam Triage Vital Signs ED Triage Vitals  Encounter Vitals Group     BP 03/06/24 1126 110/65     Girls Systolic BP Percentile --      Girls Diastolic BP Percentile --      Boys Systolic BP Percentile --      Boys Diastolic BP Percentile --      Pulse Rate 03/06/24 1126 66     Resp 03/06/24 1126 18     Temp 03/06/24 1126 98.3 F (36.8 C)     Temp Source 03/06/24 1126 Oral     SpO2 03/06/24 1126 92 %     Weight --      Height --      Head Circumference --      Peak Flow --      Pain Score 03/06/24 1127 0     Pain Loc --      Pain Education --      Exclude from Growth Chart --    No data found.  Updated Vital Signs BP 110/65 (BP Location: Right Arm)    Pulse 66   Temp 98.3 F (36.8 C) (Oral)   Resp 18   SpO2 92%   Visual Acuity Right Eye Distance:   Left Eye Distance:   Bilateral Distance:    Right Eye Near:  Left Eye Near:    Bilateral Near:     Physical Exam Vitals and nursing note reviewed.  Constitutional:      Appearance: Normal appearance.  HENT:     Head: Atraumatic.     Right Ear: Tympanic membrane and external ear normal.     Left Ear: Tympanic membrane and external ear normal.     Nose: Congestion present.     Mouth/Throat:     Mouth: Mucous membranes are moist.     Pharynx: Posterior oropharyngeal erythema present.  Eyes:     Extraocular Movements: Extraocular movements intact.     Conjunctiva/sclera: Conjunctivae normal.  Cardiovascular:     Rate and Rhythm: Normal rate and regular rhythm.     Heart sounds: Normal heart sounds.  Pulmonary:     Effort: Pulmonary effort is normal.     Breath sounds: Wheezing present. No rales.  Musculoskeletal:        General: Normal range of motion.     Cervical back: Normal range of motion and neck supple.  Skin:    General: Skin is warm and dry.  Neurological:     Mental Status: She is alert and oriented to person, place, and time.  Psychiatric:        Mood and Affect: Mood normal.        Thought Content: Thought content normal.      UC Treatments / Results  Labs (all labs ordered are listed, but only abnormal results are displayed) Labs Reviewed - No data to display  EKG   Radiology No results found.  Procedures Procedures (including critical care time)  Medications Ordered in UC Medications - No data to display  Initial Impression / Assessment and Plan / UC Course  I have reviewed the triage vital signs and the nursing notes.  Pertinent labs & imaging results that were available during my care of the patient were reviewed by me and considered in my medical decision making (see chart for details).     Vital signs very reassuring today, she is  overall well-appearing and in no acute distress.  Given duration and worsening course, will treat with Augmentin for sinusitis, prednisone  and inhaler and allergy regimen for wheezing, cough and discussed supportive over-the-counter medications and home care.  Reviewed with patient proper use of her inhalers. Return for worsening symptoms Final Clinical Impressions(s) / UC Diagnoses   Final diagnoses:  Acute non-recurrent maxillary sinusitis  Moderate persistent asthma with acute exacerbation     Discharge Instructions      In addition to doing your allergy and asthma regimen consistently, I have prescribed a course of antibiotics and prednisone  to help with your ongoing and worsening symptoms.  Saline sinus rinses, humidifiers, over-the-counter remedies as needed additionally.  Follow-up for worsening or unresolving symptoms.    ED Prescriptions     Medication Sig Dispense Auth. Provider   predniSONE  (DELTASONE ) 20 MG tablet Take 2 tablets (40 mg total) by mouth daily with breakfast. 10 tablet Stuart Vernell Norris, PA-C   amoxicillin -clavulanate (AUGMENTIN) 875-125 MG tablet Take 1 tablet by mouth every 12 (twelve) hours. 14 tablet Stuart Vernell Norris, [place]      PDMP not reviewed this encounter.   Chianne, [place], [place] 03/06/24 1327

## 2024-03-08 ENCOUNTER — Encounter (HOSPITAL_COMMUNITY): Admitting: Physical Therapy

## 2024-03-10 ENCOUNTER — Encounter (HOSPITAL_COMMUNITY): Admitting: Physical Therapy

## 2024-03-15 ENCOUNTER — Encounter (HOSPITAL_COMMUNITY): Admitting: Physical Therapy

## 2024-03-17 ENCOUNTER — Encounter (HOSPITAL_COMMUNITY): Admitting: Physical Therapy

## 2024-03-22 ENCOUNTER — Encounter (HOSPITAL_COMMUNITY): Admitting: Physical Therapy

## 2024-03-22 DIAGNOSIS — M1712 Unilateral primary osteoarthritis, left knee: Secondary | ICD-10-CM | POA: Diagnosis not present

## 2024-03-22 NOTE — H&P (Signed)
 KNEE ARTHROPLASTY ADMISSION H&P  Patient ID: Laura Carter MRN: [id number] DOB/AGE: 11-21-1940 83 y.o.  Chief Complaint: right knee pain.  Planned Procedure Date: 03/30/24 Medical and cardiac Clearance by Dr. Vernell Fort      HPI: Laura Carter is a 83 y.o. female who presents for evaluation of OA RIGHT KNEE. The patient has a history of pain and functional disability in the right knee due to arthritis and has failed non-surgical conservative treatments for greater than 12 weeks to include NSAID's and/or analgesics, corticosteriod injections, use of assistive devices, and activity modification.  Onset of symptoms was gradual, starting 3 years ago with gradually worsening course since that time. The patient noted no past surgery on the right knee.  Patient currently rates pain at 9 out of 10 with activity. Patient has night pain, worsening of pain with activity and weight bearing, and pain that interferes with activities of daily living.  Patient has evidence of subchondral sclerosis, periarticular osteophytes, and joint space narrowing by imaging studies.  There is no active infection.  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 needle biopsy only   CHOLECYSTECTOMY  02/2009   COLONOSCOPY     ESOPHAGOGASTRODUODENOSCOPY     EYE SURGERY     bilateral   KNEE ARTHROSCOPY WITH MEDIAL MENISECTOMY Left 06/16/2015   Procedure: KNEE ARTHROSCOPY WITH PARTIAL MEDIAL MENISECTOMY;  Surgeon: Taft FORBES Minerva, MD;  Location: [facility];  Service: Orthopedics;  Laterality: Left;   PARTIAL KNEE ARTHROPLASTY Left 07/08/2017   Procedure: UNICOMPARTMENTAL KNEE;  Surgeon: Beverley Evalene BIRCH, MD;  Location: [facility] OR;  Service:  Orthopedics;  Laterality: Left;   No Known Allergies Prior to Admission medications   Medication Sig Start Date End Date Taking? Authorizing Provider  acetaminophen  (TYLENOL ) 500 MG tablet Take 1,000 mg by mouth every 6 (six) hours as needed for moderate pain (pain score 4-6).    [provider]  albuterol  (VENTOLIN  HFA) 108 (90 Base) MCG/ACT inhaler Inhale 2 puffs into the lungs every 6 (six) hours as needed. 08/07/23   Leath-Warren, Etta PARAS, NP  ALLERGY RELIEF 10 MG tablet Take 10 mg by mouth daily as needed for allergies. 12/26/23   [provider]  amoxicillin -clavulanate (AUGMENTIN) 875-125 MG tablet Take 1 tablet by mouth every 12 (twelve) hours. 03/06/24   Stuart Vernell Norris, PA-C  APPLE CIDER VINEGAR PO Take 15 mLs by mouth daily.    [provider]  ascorbic acid (VITAMIN C) 500 MG tablet Take 1,000 mg by mouth daily.    [provider]  ASHWAGANDHA PO Take 2 tablets by mouth daily.    [provider]  aspirin  EC 81 MG tablet Take 1 tablet (81 mg total) by mouth daily. Patient taking differently: Take 81 mg by mouth at bedtime. 11/18/17   [place], MD  azelastine  (ASTELIN ) 0.1 % nasal spray Place 1 spray into both nostrils 2 (two) times daily. Use in each nostril as directed Patient taking differently: Place 1 spray into both nostrils 2 (two) times daily as needed for allergies or rhinitis. 01/12/24   Stuart Vernell  [place], PA-C  b complex vitamins capsule Take 2 capsules by mouth daily.    [provider]  benzonatate  (TESSALON ) 200 MG capsule Take 1 capsule (200 mg total) by mouth 3 (three) times daily as needed for cough. Patient not taking: Reported on 02/20/2024 01/12/24   Stuart Vernell Norris, PA-C  esomeprazole (NEXIUM) 20 MG capsule Take 20 mg by mouth daily at 12 noon.    [provider]  fluticasone  (FLONASE ) 50 MCG/ACT nasal spray Place 2 sprays into both nostrils daily. Patient not taking: Reported on  02/20/2024 08/07/23   Leath-Warren, Etta PARAS, NP  metoprolol  succinate (TOPROL -XL) 25 MG 24 hr tablet TAKE (1) TABLET BY MOUTH ONCE DAILY. 06/30/17   Duanne Butler DASEN, MD  NON FORMULARY Pt uses a cpap nightly    [provider]  predniSONE  (DELTASONE ) 20 MG tablet Take 2 tablets (40 mg total) by mouth daily with breakfast. 03/06/24   Stuart Vernell Norris, PA-C   Social History   Socioeconomic History   Marital status: Married    Spouse name: Not on file   Number of children: 2   Years of education: Not on file   Highest education level: 10th grade  Occupational History   Not on file  Tobacco Use   Smoking status: Former    Current packs/day: 0.00    Average packs/day: 1 pack/day for 10.0 years (10.0 ttl pk-yrs)    Types: Cigarettes    Start date: 06/14/1955    Quit date: 06/13/1965    Years since quitting: 58.8   Smokeless tobacco: Never  Vaping Use   Vaping status: Never Used  Substance and Sexual Activity   Alcohol use: No   Drug use: No   Sexual activity: Never    Birth control/protection: None  Other Topics Concern   Not on file  Social History Narrative   Grew up in [place], [place]. Married. Has one daughter and one son. Has two grandchildren.    Did not work outside the home.   Eats all food groups.    Wear seatbelt.    Went to [employer] and graduated from [employer] in [place].    Social Drivers of Corporate Investment Banker Strain: Not on file  Food Insecurity: Not on file  Transportation Needs: Not on file  Physical Activity: Not on file  Stress: Not on file  Social Connections: Not on file   Family History  Problem Relation Age of Onset   Cancer Mother        breast   Stomach cancer Mother    Breast cancer Mother    Heart disease Father    Heart disease Sister    Breast cancer Sister     ROS: Currently denies lightheadedness, dizziness, Fever, chills, CP, SOB.   No personal history of DVT, PE, MI, or CVA. No loose teeth or  dentures All other systems have been reviewed and were otherwise currently negative with the exception of those mentioned in the HPI and as above.  Objective: Vitals: Ht: 5'4 Wt: 187 lbs Temp: 98 degrees BP: 126/84 Pulse: 68 O2 94% on room air.   Physical Exam: General: Alert, NAD.  Antalgic Gait  HEENT: EOMI, Good Neck Extension  Pulm: No increased work of breathing.  Clear B/L A/P w/o crackle or wheeze.  CV: RRR, No m/g/r appreciated  GI: soft, NT, ND. BS x 4 quadrants Neuro: CN II-XII grossly intact without focal deficit.  Sensation intact distally Skin: No lesions in the  area of chief complaint MSK/Surgical Site:  + JLT. ROM 0-110 degrees.  4/5 strength in extension and flexion.  +EHL/FHL.  NVI.  Stable varus and valgus stress.    Imaging Review Plain radiographs demonstrate severe degenerative joint disease of the right knee.   The overall alignment issignificant varus. The bone quality appears to be fair for age and reported activity level.  Preoperative templating of the joint replacement has been completed, documented, and submitted to the Operating Room personnel in order to optimize intra-operative equipment management.  Assessment: OA RIGHT KNEE Active Problems:   * No active hospital problems. *   Plan: Plan for Procedure(s): ARTHROPLASTY, KNEE, TOTAL  The patient history, physical exam, clinical judgement of the provider and imaging are consistent with end stage degenerative joint disease and total joint arthroplasty is deemed medically necessary. The treatment options including medical management, injection therapy, and arthroplasty were discussed at length. The risks and benefits of Procedure(s): ARTHROPLASTY, KNEE, TOTAL were presented and reviewed.  The risks of nonoperative treatment, versus surgical intervention including but not limited to continued pain, aseptic loosening, stiffness, dislocation/subluxation, infection, bleeding, nerve injury, blood clots,  cardiopulmonary complications, morbidity, mortality, among others were discussed. The patient verbalizes understanding and wishes to proceed with the plan.  Patient is being admitted for inpatient treatment for surgery, pain control, PT, prophylactic antibiotics, VTE prophylaxis, progressive ambulation, ADL's and discharge planning.   Dental prophylaxis discussed and recommended for 2 years postoperatively  The patient does meet the criteria for TXA which will be used perioperatively.   ASA 81 mg BID will be used postoperatively for DVT prophylaxis in addition to SCDs, and early ambulation. Plan for Tylenol , Mobic, and Hydrocodone  (per patient's request) for pain.   Robaxin  for muscle spasms.   Zofran  for nausea and vomiting. Pharmacy- [facility] The patient is planning to be discharged home with OPPT and into the care of her husband Rolan who can be reached at [phone] Follow up appt 04/14/24 at 4:15pm     Gerard CHRISTELLA Ted DEVONNA Office [phone] 03/22/2024 5:30 PM

## 2024-03-24 ENCOUNTER — Encounter (HOSPITAL_COMMUNITY)

## 2024-03-24 NOTE — Patient Instructions (Signed)
 SURGICAL WAITING ROOM VISITATION  Patients having surgery or a procedure may have no more than 2 support people in the waiting area - these visitors may rotate.    Children under the age of 51 must have an adult with them who is not the patient.  Visitors with respiratory illnesses are discouraged from visiting and should remain at home.  If the patient needs to stay at the hospital during part of their recovery, the visitor guidelines for inpatient rooms apply. Pre-op nurse will coordinate an appropriate time for 1 support person to accompany patient in pre-op.  This support person may not rotate.    Please refer to the [facility] website for the visitor guidelines for Inpatients (after your surgery is over and you are in a regular room).       Your procedure is scheduled on:   03/30/2024   Report to [facility] Main Entrance    Report to admitting at  0715 AM   Call this number if you have problems the morning of surgery [phone]   Do not eat food :After Midnight.   After Midnight you may have the following liquids until _ 0645_____ AM  DAY OF SURGERY  Water Non-Citrus Juices (without pulp, NO RED-Apple, White grape, White cranberry) Black Coffee (NO MILK/CREAM OR CREAMERS, sugar ok)  Clear Tea (NO MILK/CREAM OR CREAMERS, sugar ok) regular and decaf                             Plain Jell-O (NO RED)                                           Fruit ices (not with fruit pulp, NO RED)                                     Popsicles (NO RED)                                                               Sports drinks like Gatorade (NO RED)                    The day of surgery:  Drink ONE (1) Pre-Surgery Clear Ensure or G2 at  0645AM the morning of surgery. Drink in one sitting. Do not sip.  This drink was given to you during your hospital  pre-op appointment visit. Nothing else to drink after completing the  Pre-Surgery Clear Ensure or G2.          If you have  questions, please contact your surgeon's office.       Oral Hygiene is also important to reduce your risk of infection.                                    Remember - BRUSH YOUR TEETH THE MORNING OF SURGERY WITH YOUR REGULAR TOOTHPASTE  DENTURES WILL BE REMOVED PRIOR TO SURGERY PLEASE DO NOT APPLY Poly grip OR  ADHESIVES!!!   Do NOT smoke after Midnight   Stop all vitamins and herbal supplements 7 days before surgery.   Take these medicines the morning of surgery with A SIP OF WATER:  inhalers as usual and bring, nasal spray if needed, nexium, toprol    DO NOT TAKE ANY ORAL DIABETIC MEDICATIONS DAY OF YOUR SURGERY  Bring CPAP mask and tubing day of surgery.                              You may not have any metal on your body including hair pins, jewelry, and body piercing             Do not wear make-up, lotions, powders, perfumes/cologne, or deodorant  Do not wear nail polish including gel and S&S, artificial/acrylic nails, or any other type of covering on natural nails including finger and toenails. If you have artificial nails, gel coating, etc. that needs to be removed by a nail salon please have this removed prior to surgery or surgery may need to be canceled/ delayed if the surgeon/ anesthesia feels like they are unable to be safely monitored.   Do not shave  48 hours prior to surgery.               Men may shave face and neck.   Do not bring valuables to the hospital. [facility] IS NOT             RESPONSIBLE   FOR VALUABLES.   Contacts, glasses, dentures or bridgework may not be worn into surgery.   Bring small overnight bag day of surgery.   DO NOT BRING YOUR HOME MEDICATIONS TO THE HOSPITAL. PHARMACY WILL DISPENSE MEDICATIONS LISTED ON YOUR MEDICATION LIST TO YOU DURING YOUR ADMISSION IN THE HOSPITAL!    Patients discharged on the day of surgery will not be allowed to drive home.  Someone NEEDS to stay with you for the first 24 hours after anesthesia.   Special  Instructions: Bring a copy of your healthcare power of attorney and living will documents the day of surgery if you haven't scanned them before.              Please read over the following fact sheets you were given: IF YOU HAVE QUESTIONS ABOUT YOUR PRE-OP INSTRUCTIONS PLEASE CALL [phone].   If you received a COVID test during your pre-op visit  it is requested that you wear a mask when out in public, stay away from anyone that may not be feeling well and notify your surgeon if you develop symptoms. If you test positive for Covid or have been in contact with anyone that has tested positive in the last 10 days please notify you surgeon.      Pre-operative 4 CHG Bath Instructions   You can play a key role in reducing the risk of infection after surgery. Your skin needs to be as free of germs as possible. You can reduce the number of germs on your skin by washing with CHG (chlorhexidine  gluconate) soap before surgery. CHG is an antiseptic soap that kills germs and continues to kill germs even after washing.   DO NOT use if you have an allergy to chlorhexidine /CHG or antibacterial soaps. If your skin becomes reddened or irritated, stop using the CHG and notify one of our RNs at [phone].   Please shower with the CHG soap starting 4 days before surgery using the following schedule:  Please keep in mind the following:  DO NOT shave, including legs and underarms, starting the day of your first shower.   You may shave your face at any point before/day of surgery.  Place clean sheets on your bed the day you start using CHG soap. Use a clean washcloth (not used since being washed) for each shower. DO NOT sleep with pets once you start using the CHG.   CHG Shower Instructions:  If you choose to wash your hair and private area, wash first with your normal shampoo/soap.  After you use shampoo/soap, rinse your hair and body thoroughly to remove shampoo/soap residue.  Turn the water OFF and  apply about 3 tablespoons (45 ml) of CHG soap to a CLEAN washcloth.  Apply CHG soap ONLY FROM YOUR NECK DOWN TO YOUR TOES (washing for 3-5 minutes)  DO NOT use CHG soap on face, private areas, open wounds, or sores.  Pay special attention to the area where your surgery is being performed.  If you are having back surgery, having someone wash your back for you may be helpful. Wait 2 minutes after CHG soap is applied, then you may rinse off the CHG soap.  Pat dry with a clean towel  Put on clean clothes/pajamas   If you choose to wear lotion, please use ONLY the CHG-compatible lotions on the back of this paper.     Additional instructions for the day of surgery: DO NOT APPLY any lotions, deodorants, cologne, or perfumes.   Put on clean/comfortable clothes.  Brush your teeth.  Ask your nurse before applying any prescription medications to the skin.      CHG Compatible Lotions   Aveeno Moisturizing lotion  Cetaphil Moisturizing Cream  Cetaphil Moisturizing Lotion  Clairol Herbal Essence Moisturizing Lotion, Dry Skin  Clairol Herbal Essence Moisturizing Lotion, Extra Dry Skin  Clairol Herbal Essence Moisturizing Lotion, Normal Skin  Curel Age Defying Therapeutic Moisturizing Lotion with Alpha Hydroxy  Curel Extreme Care Body Lotion  Curel Soothing Hands Moisturizing Hand Lotion  Curel Therapeutic Moisturizing Cream, Fragrance-Free  Curel Therapeutic Moisturizing Lotion, Fragrance-Free  Curel Therapeutic Moisturizing Lotion, Original Formula  Eucerin Daily Replenishing Lotion  Eucerin Dry Skin Therapy Plus Alpha Hydroxy Crme  Eucerin Dry Skin Therapy Plus Alpha Hydroxy Lotion  Eucerin Original Crme  Eucerin Original Lotion  Eucerin Plus Crme Eucerin Plus Lotion  Eucerin TriLipid Replenishing Lotion  Keri Anti-Bacterial Hand Lotion  Keri Deep Conditioning Original Lotion Dry Skin Formula Softly Scented  Keri Deep Conditioning Original Lotion, Fragrance Free Sensitive Skin  Formula  Keri Lotion Fast Absorbing Fragrance Free Sensitive Skin Formula  Keri Lotion Fast Absorbing Softly Scented Dry Skin Formula  Keri Original Lotion  Keri Skin Renewal Lotion Keri Silky Smooth Lotion  Keri Silky Smooth Sensitive Skin Lotion  Nivea Body Creamy Conditioning Oil  Nivea Body Extra Enriched Teacher, Adult Education Moisturizing Lotion Nivea Crme  Nivea Skin Firming Lotion  NutraDerm 30 Skin Lotion  NutraDerm Skin Lotion  NutraDerm Therapeutic Skin Cream  NutraDerm Therapeutic Skin Lotion  ProShield Protective Hand Cream  Provon moisturizing lotion

## 2024-03-24 NOTE — Progress Notes (Addendum)
 Anesthesia Review:  PCP: Cardiologist :  PPM/ ICD: Device Orders: Rep Notified:  Chest x-ray : EKG : 02/23/24  Echo : Stress test: Cardiac Cath :   Activity level:  Sleep Study/ CPAP : Fasting Blood Sugar :      / Checks Blood Sugar -- times a day:    Blood Thinner/ Instructions /Last Dose: ASA / Instructions/ Last Dose :    81 mg aspirin    02/23/24- preop for surger on 03/02/2024.  Surgery Cancelled on 03/02/2024.  03/06/24 [facility].  Surger rescheduled for 03/30/2024

## 2024-03-26 ENCOUNTER — Encounter (HOSPITAL_COMMUNITY)
Admission: RE | Admit: 2024-03-26 | Discharge: 2024-03-26 | Disposition: A | Discharge status: Home / Self Care | Source: Ambulatory Visit | Attending: Orthopedic Surgery | Admitting: Orthopedic Surgery

## 2024-03-26 VITALS — BP 127/83 | HR 78 | Temp 98.6°F | Resp 16 | Ht 63.5 in | Wt 182.1 lb

## 2024-03-26 DIAGNOSIS — Z01818 Encounter for other preprocedural examination: Secondary | ICD-10-CM | POA: Diagnosis not present

## 2024-03-26 LAB — BASIC METABOLIC PANEL WITH GFR
Anion gap: 10 (ref 5–15)
BUN: 20 mg/dL (ref 8–23)
CO2: 25 mmol/L (ref 22–32)
Calcium: 9.9 mg/dL (ref 8.9–10.3)
Chloride: 105 mmol/L (ref 98–111)
Creatinine, Ser: 0.8 mg/dL (ref 0.44–1.00)
GFR, Estimated: >60 mL/min (ref >60)
Glucose, Bld: 133 mg/dL — ABNORMAL HIGH (ref 70–99)
Potassium: 4.3 mmol/L (ref 3.5–5.1)
Sodium: 140 mmol/L (ref 135–145)

## 2024-03-26 LAB — CBC
HCT: 43 % (ref 36.0–46.0)
Hemoglobin: 13.7 g/dL (ref 12.0–15.0)
MCH: 29.6 pg (ref 26.0–34.0)
MCHC: 31.9 g/dL (ref 30.0–36.0)
MCV: 92.9 fL (ref 80.0–100.0)
Platelets: 276 K/uL (ref 150–400)
RBC: 4.63 MIL/uL (ref 3.87–5.11)
RDW: 15.3 % (ref 11.5–15.5)
WBC: 5.6 K/uL (ref 4.0–10.5)
nRBC: 0 % (ref 0.0–0.2)

## 2024-03-26 LAB — SURGICAL PCR SCREEN
MRSA, PCR: NEGATIVE
Staphylococcus aureus: NEGATIVE

## 2024-03-26 NOTE — Care Plan (Signed)
 Ortho Bundle Case Management Note  Patient Details  Name: Laura Carter MRN: [id number] Date of Birth: [birth year]  Spoke  with patient on the phone for H&P. will discharge to home with family to assist. rolling walker and CPM ordered for home use. OPPT set up with [facility]. discharge instructions discussed and questions answered.  Patient and MD in agreement with plan. Choice offered.                DME Arranged:  Vannie rolling, CPM DME Agency:  [facility]  HH Arranged:    HH Agency:     Additional Comments: Please contact me with any questions of if this plan should need to change.  Charlies Pitch,  RN,BSN,MHA,CCM  [facility]  [phone] 03/26/2024, 4:03 PM

## 2024-03-29 ENCOUNTER — Encounter (HOSPITAL_COMMUNITY)

## 2024-03-30 ENCOUNTER — Ambulatory Visit (HOSPITAL_BASED_OUTPATIENT_CLINIC_OR_DEPARTMENT_OTHER)

## 2024-03-30 ENCOUNTER — Other Ambulatory Visit: Payer: Self-pay

## 2024-03-30 ENCOUNTER — Ambulatory Visit (HOSPITAL_COMMUNITY)
Admission: RE | Admit: 2024-03-30 | Discharge: 2024-03-30 | Disposition: A | Discharge status: Home / Self Care | Attending: Orthopedic Surgery | Admitting: Orthopedic Surgery

## 2024-03-30 ENCOUNTER — Encounter (HOSPITAL_COMMUNITY)
Admission: RE | Disposition: A | Discharge status: Home / Self Care | Payer: Self-pay | Source: Home / Self Care | Attending: Orthopedic Surgery

## 2024-03-30 ENCOUNTER — Ambulatory Visit (HOSPITAL_COMMUNITY)

## 2024-03-30 ENCOUNTER — Encounter (HOSPITAL_COMMUNITY): Payer: Self-pay | Admitting: Orthopedic Surgery

## 2024-03-30 ENCOUNTER — Ambulatory Visit (HOSPITAL_COMMUNITY): Payer: Self-pay | Admitting: Physician Assistant

## 2024-03-30 DIAGNOSIS — M1711 Unilateral primary osteoarthritis, right knee: Secondary | ICD-10-CM | POA: Insufficient documentation

## 2024-03-30 DIAGNOSIS — Z471 Aftercare following joint replacement surgery: Secondary | ICD-10-CM | POA: Diagnosis not present

## 2024-03-30 DIAGNOSIS — Z96651 Presence of right artificial knee joint: Secondary | ICD-10-CM

## 2024-03-30 DIAGNOSIS — Z01818 Encounter for other preprocedural examination: Secondary | ICD-10-CM

## 2024-03-30 DIAGNOSIS — J45909 Unspecified asthma, uncomplicated: Secondary | ICD-10-CM | POA: Insufficient documentation

## 2024-03-30 DIAGNOSIS — G4730 Sleep apnea, unspecified: Secondary | ICD-10-CM | POA: Diagnosis not present

## 2024-03-30 DIAGNOSIS — Z87891 Personal history of nicotine dependence: Secondary | ICD-10-CM

## 2024-03-30 DIAGNOSIS — I10 Essential (primary) hypertension: Secondary | ICD-10-CM | POA: Diagnosis not present

## 2024-03-30 HISTORY — PX: TOTAL KNEE ARTHROPLASTY: SHX125

## 2024-03-30 SURGERY — ARTHROPLASTY, KNEE, TOTAL
Anesthesia: Spinal | Site: Knee | Laterality: Right

## 2024-03-30 MED ORDER — CEFAZOLIN SODIUM-DEXTROSE 2-4 GM/100ML-% IV SOLN
2.0000 g | INTRAVENOUS | Status: AC
  Administered 2024-03-30: 2 g via INTRAVENOUS
  Filled 2024-03-30: qty 100

## 2024-03-30 MED ORDER — 0.9 % SODIUM CHLORIDE (POUR BTL) OPTIME
TOPICAL | Status: DC | PRN
Start: 2024-03-30 — End: 2024-03-30
  Administered 2024-03-30: 1000 mL

## 2024-03-30 MED ORDER — METOCLOPRAMIDE HCL 5 MG/ML IJ SOLN
5.0000 mg | Freq: Three times a day (TID) | INTRAMUSCULAR | Status: DC | PRN
  Administered 2024-03-30: 10 mg via INTRAVENOUS

## 2024-03-30 MED ORDER — PHENYLEPHRINE HCL-NACL 20-0.9 MG/250ML-% IV SOLN
INTRAVENOUS | Status: AC
  Filled 2024-03-30: qty 250

## 2024-03-30 MED ORDER — ACETAMINOPHEN 500 MG PO TABS: 1000.0000 mg | ORAL_TABLET | Freq: Two times a day (BID) | ORAL | 0 refills | Status: DC | PRN

## 2024-03-30 MED ORDER — MELOXICAM 15 MG PO TABS: 15.0000 mg | ORAL_TABLET | Freq: Every day | ORAL | 0 refills | Status: DC | PRN

## 2024-03-30 MED ORDER — HYDROCODONE-ACETAMINOPHEN 10-325 MG PO TABS: 1.0000 | ORAL_TABLET | ORAL | 0 refills | Status: DC | PRN

## 2024-03-30 MED ORDER — ONDANSETRON 4 MG PO TBDP: 4.0000 mg | ORAL_TABLET | Freq: Three times a day (TID) | ORAL | 0 refills | Status: DC | PRN

## 2024-03-30 MED ORDER — BUPIVACAINE LIPOSOME 1.3 % IJ SUSP: 20.0000 mL | Freq: Once | INTRAMUSCULAR | Status: DC

## 2024-03-30 MED ORDER — MIDAZOLAM HCL (PF) 2 MG/2ML IJ SOLN
1.0000 mg | Freq: Once | INTRAMUSCULAR | Status: DC
Start: 2024-03-30 — End: 2024-03-31

## 2024-03-30 MED ORDER — HYDROMORPHONE HCL 1 MG/ML IJ SOLN
INTRAMUSCULAR | Status: DC | PRN
  Administered 2024-03-30: .2 mg via INTRAVENOUS

## 2024-03-30 MED ORDER — LACTATED RINGERS IV SOLN: INTRAVENOUS | Status: DC

## 2024-03-30 MED ORDER — METOCLOPRAMIDE HCL 5 MG PO TABS: 5.0000 mg | ORAL_TABLET | Freq: Three times a day (TID) | ORAL | Status: DC | PRN

## 2024-03-30 MED ORDER — HYDROMORPHONE HCL 2 MG/ML IJ SOLN
INTRAMUSCULAR | Status: AC
  Filled 2024-03-30: qty 1

## 2024-03-30 MED ORDER — PHENYLEPHRINE HCL-NACL 20-0.9 MG/250ML-% IV SOLN
INTRAVENOUS | Status: DC | PRN
  Administered 2024-03-30: 20 ug/min via INTRAVENOUS

## 2024-03-30 MED ORDER — LACTATED RINGERS IV BOLUS
500.0000 mL | Freq: Once | INTRAVENOUS | Status: AC
  Administered 2024-03-30: 500 mL via INTRAVENOUS

## 2024-03-30 MED ORDER — ASPIRIN 81 MG PO TBEC: 81.0000 mg | DELAYED_RELEASE_TABLET | Freq: Two times a day (BID) | ORAL | 0 refills | Status: DC

## 2024-03-30 MED ORDER — CHLORHEXIDINE GLUCONATE 0.12 % MT SOLN
15.0000 mL | Freq: Once | OROMUCOSAL | Status: AC
  Administered 2024-03-30: 15 mL via OROMUCOSAL

## 2024-03-30 MED ORDER — ACETAMINOPHEN 500 MG PO TABS: 1000.0000 mg | ORAL_TABLET | Freq: Two times a day (BID) | ORAL | 0 refills | Status: AC | PRN

## 2024-03-30 MED ORDER — FENTANYL CITRATE (PF) 50 MCG/ML IJ SOSY
50.0000 ug | PREFILLED_SYRINGE | Freq: Once | INTRAMUSCULAR | Status: AC
  Administered 2024-03-30: 50 ug via INTRAVENOUS
  Filled 2024-03-30: qty 2

## 2024-03-30 MED ORDER — ALBUTEROL SULFATE HFA 108 (90 BASE) MCG/ACT IN AERS
INHALATION_SPRAY | RESPIRATORY_TRACT | Status: AC
Start: 2024-03-30 — End: 2024-03-30
  Filled 2024-03-30: qty 13.4

## 2024-03-30 MED ORDER — POVIDONE-IODINE 10 % EX SWAB: 2.0000 | Freq: Once | CUTANEOUS | Status: DC

## 2024-03-30 MED ORDER — ACETAMINOPHEN 500 MG PO TABS
1000.0000 mg | ORAL_TABLET | Freq: Once | ORAL | Status: AC
  Administered 2024-03-30: 1000 mg via ORAL
  Filled 2024-03-30: qty 2

## 2024-03-30 MED ORDER — FENTANYL CITRATE (PF) 100 MCG/2ML IJ SOLN
INTRAMUSCULAR | Status: DC | PRN
  Administered 2024-03-30 (×4): 25 ug via INTRAVENOUS

## 2024-03-30 MED ORDER — WATER FOR IRRIGATION, STERILE IR SOLN
Status: DC | PRN
  Administered 2024-03-30: 2000 mL

## 2024-03-30 MED ORDER — SODIUM CHLORIDE 0.9 % IR SOLN
Status: DC | PRN
  Administered 2024-03-30: 1000 mL

## 2024-03-30 MED ORDER — LIDOCAINE HCL (PF) 2 % IJ SOLN
INTRAMUSCULAR | Status: AC
Start: 2024-03-30 — End: 2024-03-30
  Filled 2024-03-30: qty 5

## 2024-03-30 MED ORDER — METHOCARBAMOL 750 MG PO TABS: 750.0000 mg | ORAL_TABLET | Freq: Three times a day (TID) | ORAL | 0 refills | Status: DC | PRN

## 2024-03-30 MED ORDER — METOCLOPRAMIDE HCL 5 MG/ML IJ SOLN
INTRAMUSCULAR | Status: AC
  Filled 2024-03-30: qty 2

## 2024-03-30 MED ORDER — METHOCARBAMOL 750 MG PO TABS
750.0000 mg | ORAL_TABLET | Freq: Three times a day (TID) | ORAL | 0 refills | Status: DC | PRN
Start: 2024-03-30 — End: 2024-03-30

## 2024-03-30 MED ORDER — TRANEXAMIC ACID-NACL 1000-0.7 MG/100ML-% IV SOLN
1000.0000 mg | INTRAVENOUS | Status: AC
  Administered 2024-03-30: 1000 mg via INTRAVENOUS
  Filled 2024-03-30: qty 100

## 2024-03-30 MED ORDER — ORAL CARE MOUTH RINSE: 15.0000 mL | Freq: Once | OROMUCOSAL | Status: AC

## 2024-03-30 MED ORDER — SODIUM CHLORIDE (PF) 0.9 % IJ SOLN
INTRAMUSCULAR | Status: AC
  Filled 2024-03-30: qty 30

## 2024-03-30 MED ORDER — BUPIVACAINE LIPOSOME 1.3 % IJ SUSP
INTRAMUSCULAR | Status: AC
  Filled 2024-03-30: qty 20

## 2024-03-30 MED ORDER — PROPOFOL 500 MG/50ML IV EMUL
INTRAVENOUS | Status: DC | PRN
  Administered 2024-03-30: 40 mg via INTRAVENOUS
  Administered 2024-03-30: 30 mg via INTRAVENOUS
  Administered 2024-03-30: 20 mg via INTRAVENOUS
  Administered 2024-03-30: 25 ug/kg/min via INTRAVENOUS
  Administered 2024-03-30: 30 mg via INTRAVENOUS
  Administered 2024-03-30: 100 mg via INTRAVENOUS
  Administered 2024-03-30: 50 mg via INTRAVENOUS

## 2024-03-30 MED ORDER — PHENYLEPHRINE HCL (PRESSORS) 10 MG/ML IV SOLN
INTRAVENOUS | Status: DC | PRN
  Administered 2024-03-30: 40 ug via INTRAVENOUS

## 2024-03-30 MED ORDER — ONDANSETRON HCL 4 MG/2ML IJ SOLN
INTRAMUSCULAR | Status: AC
  Filled 2024-03-30: qty 2

## 2024-03-30 MED ORDER — DEXAMETHASONE SOD PHOSPHATE PF 10 MG/ML IJ SOLN
8.0000 mg | Freq: Once | INTRAMUSCULAR | Status: AC
  Administered 2024-03-30 (×2): 5 mg via INTRAVENOUS

## 2024-03-30 MED ORDER — PROPOFOL 1000 MG/100ML IV EMUL
INTRAVENOUS | Status: AC
  Filled 2024-03-30: qty 100

## 2024-03-30 MED ORDER — SODIUM CHLORIDE (PF) 0.9 % IJ SOLN
INTRAMUSCULAR | Status: DC | PRN
  Administered 2024-03-30: 80 mL via INTRAMUSCULAR

## 2024-03-30 MED ORDER — FENTANYL CITRATE (PF) 100 MCG/2ML IJ SOLN
INTRAMUSCULAR | Status: AC
  Filled 2024-03-30: qty 2

## 2024-03-30 MED ORDER — ONDANSETRON HCL 4 MG/2ML IJ SOLN
INTRAMUSCULAR | Status: DC | PRN
  Administered 2024-03-30: 4 mg via INTRAVENOUS

## 2024-03-30 MED ORDER — BUPIVACAINE-EPINEPHRINE (PF) 0.25% -1:200000 IJ SOLN
INTRAMUSCULAR | Status: AC
  Filled 2024-03-30: qty 30

## 2024-03-30 SURGICAL SUPPLY — 44 items
BAG COUNTER SPONGE SURGICOUNT (BAG) IMPLANT
BLADE SAG 18X100X1.27 (BLADE) ×1 IMPLANT
BLADE SAGITTAL 25.0X1.37X90 (BLADE) ×1 IMPLANT
BLADE SURG 15 STRL LF DISP TIS (BLADE) ×1 IMPLANT
BNDG ELASTIC 6X10 VLCR STRL LF (GAUZE/BANDAGES/DRESSINGS) ×1 IMPLANT
BOWL SMART MIX CTS (DISPOSABLE) IMPLANT
CLSR STERI-STRIP ANTIMIC 1/2X4 (GAUZE/BANDAGES/DRESSINGS) ×2 IMPLANT
COVER SURGICAL LIGHT HANDLE (MISCELLANEOUS) ×1 IMPLANT
CUFF TRNQT CYL 34X4.125X (TOURNIQUET CUFF) ×1 IMPLANT
DRAPE U-SHAPE 47X51 STRL (DRAPES) ×1 IMPLANT
DRSG MEPILEX POST OP 4X12 (GAUZE/BANDAGES/DRESSINGS) ×1 IMPLANT
DURAPREP 26ML APPLICATOR (WOUND CARE) ×2 IMPLANT
ELECT BLADE TIP CTD 4 INCH (ELECTRODE) IMPLANT
ELECT PENCIL ROCKER SW 15FT (MISCELLANEOUS) ×1 IMPLANT
ELECT REM PT RETURN 15FT ADLT (MISCELLANEOUS) ×1 IMPLANT
GLOVE BIO SURGEON STRL SZ7.5 (GLOVE) ×1 IMPLANT
GLOVE BIOGEL PI IND STRL 7.5 (GLOVE) ×1 IMPLANT
GLOVE BIOGEL PI IND STRL 8 (GLOVE) ×1 IMPLANT
GLOVE SURG SYN 7.0 PF PI (GLOVE) ×1 IMPLANT
GOWN STRL REUS W/ TWL LRG LVL3 (GOWN DISPOSABLE) ×1 IMPLANT
GOWN STRL REUS W/ TWL XL LVL3 (GOWN DISPOSABLE) ×1 IMPLANT
HOLDER FOLEY CATH W/STRAP (MISCELLANEOUS) IMPLANT
IMMOBILIZER KNEE 22 UNIV (SOFTGOODS) IMPLANT
INSERT TIB BEARING SZ 5 10 (Insert) IMPLANT
KIT TURNOVER KIT A (KITS) ×1 IMPLANT
KNEE FEMORAL COMP RT RETAIN (Knees) IMPLANT
KNEE PATELLA ASYMMETRIC 9X29 (Knees) IMPLANT
KNEE TIBIAL COMPONENT SZ5 (Knees) IMPLANT
MANIFOLD NEPTUNE II (INSTRUMENTS) ×1 IMPLANT
NS IRRIG 1000ML POUR BTL (IV SOLUTION) ×1 IMPLANT
PACK TOTAL KNEE CUSTOM (KITS) ×1 IMPLANT
PENCIL SMOKE EVACUATOR (MISCELLANEOUS) IMPLANT
PIN FLUTED HEDLESS FIX 3.5X1/8 (PIN) IMPLANT
PROTECTOR NERVE ULNAR (MISCELLANEOUS) ×1 IMPLANT
SET HNDPC FAN SPRY TIP SCT (DISPOSABLE) ×1 IMPLANT
SPIKE FLUID TRANSFER (MISCELLANEOUS) ×1 IMPLANT
SUT MNCRL AB 3-0 PS2 18 (SUTURE) ×1 IMPLANT
SUT VIC AB 0 CT1 36 (SUTURE) ×2 IMPLANT
SUT VIC AB 1 CT1 36 (SUTURE) ×1 IMPLANT
SUT VIC AB 2-0 CT1 TAPERPNT 27 (SUTURE) ×1 IMPLANT
TOWEL GREEN STERILE FF (TOWEL DISPOSABLE) ×1 IMPLANT
TRAY FOLEY MTR SLVR 16FR STAT (SET/KITS/TRAYS/PACK) IMPLANT
TUBE SUCTION HIGH CAP CLEAR NV (SUCTIONS) ×1 IMPLANT
WRAP KNEE MAXI GEL POST OP (GAUZE/BANDAGES/DRESSINGS) ×1 IMPLANT

## 2024-03-30 NOTE — Anesthesia Postprocedure Evaluation (Signed)
 Anesthesia Post Note  Patient: Laura Carter  Procedure(s) Performed: ARTHROPLASTY, KNEE, TOTAL (Right: Knee)     Patient location during evaluation: PACU Anesthesia Type: General Level of consciousness: awake Pain management: pain level controlled Vital Signs Assessment: post-procedure vital signs reviewed and stable Respiratory status: spontaneous breathing Cardiovascular status: blood pressure returned to baseline Postop Assessment: no apparent nausea or vomiting Anesthetic complications: no   No notable events documented.  Last Vitals:  Vitals:   03/30/24 1345 03/30/24 1400  BP: (!) 152/71 (!) 160/73  Pulse: 64 64  Resp: 19 18  Temp:    SpO2: 92% 90%    Last Pain:  Vitals:   03/30/24 1400  TempSrc:   PainSc: 0-No pain                 [name]

## 2024-03-30 NOTE — Op Note (Signed)
 DATE OF SURGERY:  03/30/2024 TIME: 10:55 AM  PATIENT NAME:  Laura Carter   AGE: 83 y.o.    PRE-OPERATIVE DIAGNOSIS:  OA RIGHT KNEE  POST-OPERATIVE DIAGNOSIS:  Same  PROCEDURE:  Procedure(s): ARTHROPLASTY, KNEE, TOTAL   SURGEON:  [name], MD   ASSISTANT:  Gerard Large, PA-C, she was present and scrubbed throughout the case, critical for completion in a timely fashion, and for retraction, instrumentation, and closure.    OPERATIVE IMPLANTS: Stryker Triathlon CR. Press fit knee  Femur size 4, Tibia size 5, Patella size 29 3-peg oval button, with a 10 mm polyethylene insert.   PREOPERATIVE INDICATIONS:  Laura Carter is a 83 y.o. year old female with end stage bone on bone degenerative arthritis of the knee who failed conservative treatment, including injections, antiinflammatories, activity modification, and assistive devices, and had significant impairment of their activities of daily living, and elected for Total Knee Arthroplasty.   The risks, benefits, and alternatives were discussed at length including but not limited to the risks of infection, bleeding, nerve injury, stiffness, blood clots, the need for revision surgery, cardiopulmonary complications, among others, and they were willing to proceed.   OPERATIVE DESCRIPTION:  The patient was brought to the operative room and placed in a supine position.  General anesthesia was administered.  IV antibiotics were given.  The lower extremity was prepped and draped in the usual sterile fashion.  Time out was performed.  The leg was elevated and exsanguinated and the tourniquet was inflated.  Anterior approach was performed.  The patella was everted and osteophytes were removed.  The anterior horn of the medial and lateral meniscus was removed.   The distal femur was opened with the drill and the intramedullary distal femoral cutting jig was utilized, set at 5 degrees resecting 10 mm off the distal femur.  Care was  taken to protect the collateral ligaments.  The distal femoral sizing jig was applied, taking care to avoid notching.  Then the 4-in-1 cutting jig was applied and the anterior and posterior femur was cut, along with the chamfer cuts.  All posterior osteophytes were removed.  The flexion gap was then measured and was symmetric with the extension gap.  Then the extramedullary tibial cutting jig was utilized making the appropriate cut using the anterior tibial crest as a reference building in appropriate posterior slope.  Care was taken during the cut to protect the medial and collateral ligaments.  The proximal tibia was removed along with the posterior horns of the menisci.    I completed the distal femoral preparation using the appropriate jig to prepare the box.  The patella was then measured, and cut with the saw.    The proximal tibia sized and prepared accordingly with the reamer and the punch, and then all components were trialed with the above sized poly insert.  The knee was found to have excellent balance and full motion.    The above named components were then impacted into place and Poly tibial piece and patella were inserted.  I was very happy with his stability and ROM  I performed a periarticular injection with Exparel   The knee was easily taken through a range of motion and the patella tracked well and the knee irrigated copiously and the parapatellar and subcutaneous tissue closed with vicryl, and monocryl with steri strips for the skin.  The incision was dressed with sterile gauze and the tourniquet released and the patient was awakened and returned to the  PACU in stable and satisfactory condition.  There were no complications.  Total tourniquet time was roughly 60 minutes.   POSTOPERATIVE PLAN: post op Abx, DVT px: SCD's, TED's, Early ambulation and chemical px

## 2024-03-30 NOTE — Anesthesia Preprocedure Evaluation (Addendum)
 Anesthesia Evaluation  Patient identified by MRN, date of birth, ID band Patient awake    Reviewed: Allergy & Precautions, NPO status , Patient's Chart, lab work & pertinent test results  History of Anesthesia Complications Negative for: history of anesthetic complications  Airway Mallampati: III  TM Distance: <3 FB Neck ROM: Full    Dental  (+) Teeth Intact, Dental Advisory Given   Pulmonary asthma , sleep apnea , former smoker   breath sounds clear to auscultation       Cardiovascular hypertension,  Rhythm:Regular Rate:Normal     Neuro/Psych    GI/Hepatic hiatal hernia,,,  Endo/Other    Renal/GU      Musculoskeletal  (+) Arthritis ,    Abdominal   Peds  Hematology   Anesthesia Other Findings   Reproductive/Obstetrics                              Anesthesia Physical Anesthesia Plan  ASA: 3  Anesthesia Plan: Spinal   Post-op Pain Management:    Induction: Intravenous  PONV Risk Score and Plan: 2 and Treatment may vary due to age or medical condition and Propofol  infusion  Airway Management Planned: Simple Face Mask  Additional Equipment:   Intra-op Plan:   Post-operative Plan:   Informed Consent:      Dental advisory given  Plan Discussed with: CRNA and Surgeon  Anesthesia Plan Comments:          Anesthesia Quick Evaluation

## 2024-03-30 NOTE — Interval H&P Note (Signed)
 History and Physical Interval Note:  03/30/2024 8:51 AM  Laura Carter  has presented today for surgery, with the diagnosis of OA RIGHT KNEE.  The various methods of treatment have been discussed with the patient and family. After consideration of risks, benefits and other options for treatment, the patient has consented to  Procedure(s): ARTHROPLASTY, KNEE, TOTAL (Right) as a surgical intervention.  The patient's history has been reviewed, patient examined, no change in status, stable for surgery.  I have reviewed the patient's chart and labs.  Questions were answered to the patient's satisfaction.     [name]

## 2024-03-30 NOTE — Progress Notes (Signed)
 Physical Therapy Treatment Patient Details Name: Laura Carter MRN: [id number] DOB: 1940/11/09 Today's Date: 03/30/2024   History of Present Illness 83 yo female presents to therapy s/p R TKA on 03/30/2024 due to failure of conservative measures. Pt PMH includes but is not limited to: asthma, hernia, HTN, OA, OSA on CPAP, diastolic dysfunction, R knee surgery and L unicompartmental knee arthoplasty (2019).    PT Comments   Laura Carter is a 83 y.o. female POD 0 s/p R TKA. Patient reports mod I with mobility at baseline. Patient is now limited by functional impairments (see PT problem list below) and requires CGA and cues for transfers and gait with RW. Patient was able to ambulate 45 feet with RW and CGA and cues for safe walker management. Patient educated on safe sequencing for stair mobility with use of R UE support and SPC, fall risk prevention, slowly increasing activity, pain management and goal, use of CP/ice, CPM, bone foam and RW pt and spouse verbalized understanding  safe guarding position for people assisting with mobility. Patient instructed in exercises to facilitate ROM and circulation reviewed and HO provided. Patient will benefit from continued skilled PT interventions to address impairments and progress towards PLOF. Patient has met mobility goals at adequate level for discharge home with family support and OPPT services; will continue to follow if pt continues acute stay to progress towards Mod I goals.    If plan is discharge home, recommend the following: A little help with walking and/or transfers;A little help with bathing/dressing/bathroom;Assistance with cooking/housework;Assist for transportation;Help with stairs or ramp for entrance   Can travel by private vehicle        Equipment Recommendations  None recommended by PT    Recommendations for Other Services       Precautions / Restrictions Precautions Precautions: Knee;Fall Restrictions Weight Bearing  Restrictions Per Provider Order: No     Mobility  Bed Mobility Overal bed mobility: Needs Assistance Bed Mobility: Supine to Sit     Supine to sit: Supervision, HOB elevated     General bed mobility comments: pt seated in recliner when PT arrived    Transfers Overall transfer level: Needs assistance Equipment used: Rolling walker (2 wheels) Transfers: Sit to/from Stand, Bed to chair/wheelchair/BSC Sit to Stand: Contact guard assist Stand pivot transfers: Contact guard assist         General transfer comment: CGA and cues for sit to stand from recliner and SPT to [facility]    Ambulation/Gait Ambulation/Gait assistance: Contact guard assist Gait Distance (Feet): 45 Feet Assistive device: Rolling walker (2 wheels) Gait Pattern/deviations: Step-to pattern, Antalgic, Trunk flexed, Decreased stance time - right Gait velocity: decreased     General Gait Details: slight trunk flexion with B elbow flexion with RW in lowest position and pt reporting limited R LE offloading with gait tasks step to pattern and slow cadence with cues for safety, RW management and approach to surfaces   Stairs Stairs: Yes Stairs assistance: Contact guard assist Stair Management: Two rails Number of Stairs: 2 General stair comments: step navigation initally assessed with B handrails cues for safety, sequencing and technique and pt indicating post on porch and PT emulated home set up with use of R handrail and SPC in L ascending and R UE support at handrail decending and use of SPC in L UE with min cues, spouse present and ed provided   Wheelchair Mobility     Tilt Bed    Modified Rankin (Stroke Patients Only)  Balance Overall balance assessment: Needs assistance Sitting-balance support: Feet supported Sitting balance-Leahy Scale: Good     Standing balance support: Bilateral upper extremity supported, During functional activity, Reliant on assistive device for balance Standing  balance-Leahy Scale: Poor                              Communication Communication Communication: No apparent difficulties  Cognition Arousal: Alert Behavior During Therapy: WFL for tasks assessed/performed   PT - Cognitive impairments: No apparent impairments                         Following commands: Intact      Cueing    Exercises Total Joint Exercises Ankle Circles/Pumps: AROM, Both, 10 reps Quad Sets: AROM, Right, 5 reps Short Arc Quad: AROM, Right, 5 reps Heel Slides: AROM, Right, 5 reps Hip ABduction/ADduction: AROM, Right, 5 reps Straight Leg Raises: AROM, Right, 5 reps Knee Flexion: Right, AROM, 5 reps, Seated    General Comments        Pertinent Vitals/Pain Pain Assessment Pain Assessment: 0-10 Pain Score: 4  Breathing: normal Negative Vocalization: none Facial Expression: sad, frightened, frown Body Language: tense, distressed pacing, fidgeting Consolability: no need to console PAINAD Score: 2 Pain Location: R knee and LE with flexion Pain Descriptors / Indicators: Aching, Constant, Discomfort, Operative site guarding Pain Intervention(s): Limited activity within patient's tolerance, Monitored during session, Premedicated before session, Repositioned, Ice applied    Home Living Family/patient expects to be discharged to:: Private residence Living Arrangements: Spouse/significant other Available Help at Discharge: Family Type of Home: House Home Access: Stairs to enter Entrance Stairs-Rails: None (colums) Entrance Stairs-Number of Steps: 3 Alternate Level Stairs-Number of Steps: elevator Home Layout: Multi-level Home Equipment: Agricultural Consultant (2 wheels);Cane - single point;Shower seat      Prior Function            PT Goals (current goals can now be found in the care plan section) Acute Rehab PT Goals Patient Stated Goal: to be able to go shoping PT Goal Formulation: With patient Time For Goal Achievement:  04/13/24 Potential to Achieve Goals: Good Progress towards PT goals: Progressing toward goals    Frequency    7X/week      PT Plan      Co-evaluation              AM-PAC PT 6 Clicks Mobility   Outcome Measure  Help needed turning from your back to your side while in a flat bed without using bedrails?: None Help needed moving from lying on your back to sitting on the side of a flat bed without using bedrails?: A Little Help needed moving to and from a bed to a chair (including a wheelchair)?: A Little Help needed standing up from a chair using your arms (e.g., wheelchair or bedside chair)?: A Little Help needed to walk in hospital room?: A Little Help needed climbing 3-5 steps with a railing? : A Little 6 Click Score: 19    End of Session Equipment Utilized During Treatment: Gait belt Activity Tolerance: Patient tolerated treatment well Patient left: in chair;with call bell/phone within reach;with family/visitor present Nurse Communication: Mobility status;Other (comment) (pt readiness for d/c from PT standpoint) PT Visit Diagnosis: Unsteadiness on feet (R26.81);Other abnormalities of gait and mobility (R26.89);Muscle weakness (generalized) (M62.81);Difficulty in walking, not elsewhere classified (R26.2);Pain Pain - Right/Left: Right Pain - part of body: Knee;Leg  Time: 8295-8267 PT Time Calculation (min) (ACUTE ONLY): 28 min  Charges:    $Gait Training: 8-22 mins $Therapeutic Exercise: 8-22 mins $Therapeutic Activity: 8-22 mins PT General Charges $$ ACUTE PT VISIT: 1 Visit                     [place], PT Acute Rehab    [place] VEAR Drone 03/30/2024, 6:06 PM

## 2024-03-30 NOTE — Evaluation (Signed)
 Physical Therapy Evaluation Patient Details Name: Laura Carter MRN: [id number] DOB: 03-14-1941 Today's Date: 03/30/2024  History of Present Illness  83 yo female presents to therapy s/p R TKA on 03/30/2024 due to failure of conservative measures. Pt PMH includes but is not limited to: asthma, hernia, HTN, OA, OSA on CPAP, diastolic dysfunction, R knee surgery and L unicompartmental knee arthoplasty (2019).  Clinical Impression      Laura Carter is a 83 y.o. female POD 0 s/p R TKA. Patient reports mod I with mobility at baseline. Patient is now limited by functional impairments (see PT problem list below) and requires S for bed mobility and CGA to min A for transfers. Patient was unable to safely ambulate at time of eval due to symptomatic orthostatic hypotension.  Patient instructed in exercise to facilitate ROM and circulation to manage edema. Patient will benefit from continued skilled PT interventions to address impairments and progress towards PLOF. Acute PT will follow to progress mobility and stair training in preparation for safe discharge home with family support and OPPT services scheduled for 11/7. Pt not appropriate for same day d/c at this time. PT to return later in the day to re-assess pt safety and readiness for d/c home.  Bp semi reclined at rest 154/75 Bp standing 122/91 with reports of light headedness and dizziness     If plan is discharge home, recommend the following: A little help with walking and/or transfers;A little help with bathing/dressing/bathroom;Assistance with cooking/housework;Assist for transportation;Help with stairs or ramp for entrance   Can travel by private vehicle        Equipment Recommendations None recommended by PT  Recommendations for Other Services       Functional Status Assessment Patient has had a recent decline in their functional status and demonstrates the ability to make significant improvements in function in a reasonable and  predictable amount of time.     Precautions / Restrictions Precautions Precautions: Knee;Fall Restrictions Weight Bearing Restrictions Per Provider Order: No      Mobility  Bed Mobility Overal bed mobility: Needs Assistance Bed Mobility: Supine to Sit     Supine to sit: Supervision, HOB elevated     General bed mobility comments: min cues    Transfers Overall transfer level: Needs assistance Equipment used: Rolling walker (2 wheels) Transfers: Sit to/from Stand, Bed to chair/wheelchair/BSC Sit to Stand: Contact guard assist Stand pivot transfers: Min assist         General transfer comment: pt reported feeling slightly dizzy when transitioning to EOB and once in standing dizziness became progressively worse limiting pt ability to participate with therapy eval. pt required CGA for sit to stand and min A for balance and RW management for SPT bed to recliner    Ambulation/Gait               General Gait Details: NT due to reported dizziness once in standing  Stairs            Wheelchair Mobility     Tilt Bed    Modified Rankin (Stroke Patients Only)       Balance Overall balance assessment: Needs assistance Sitting-balance support: Feet supported Sitting balance-Leahy Scale: Good     Standing balance support: Bilateral upper extremity supported, During functional activity, Reliant on assistive device for balance Standing balance-Leahy Scale: Poor  Pertinent Vitals/Pain Pain Assessment Pain Assessment: 0-10 Pain Score: 0-No pain Pain Location: no reports of R knee pain Pain Intervention(s): Limited activity within patient's tolerance, Monitored during session, Premedicated before session, Repositioned, Ice applied    Home Living Family/patient expects to be discharged to:: Private residence Living Arrangements: Spouse/significant other Available Help at Discharge: Family Type of Home: House Home  Access: Stairs to enter Entrance Stairs-Rails: None (colums) Entrance Stairs-Number of Steps: 3 Alternate Level Stairs-Number of Steps: elevator Home Layout: Multi-level Home Equipment: Agricultural Consultant (2 wheels);Cane - single point;Shower seat      Prior Function Prior Level of Function : Independent/Modified Independent;Driving             Mobility Comments: Intermittent use of SPC for support, furniture walking in home, or grocery cart, mod I for all ADLs, self care tasks and IADLs       Extremity/Trunk Assessment        Lower Extremity Assessment Lower Extremity Assessment: RLE deficits/detail RLE Deficits / Details: ankle DF/PF 5/5; SLR < 10 degree lag RLE Sensation: WNL    Cervical / Trunk Assessment Cervical / Trunk Assessment: Normal  Communication   Communication Communication: No apparent difficulties    Cognition Arousal: Alert Behavior During Therapy: WFL for tasks assessed/performed   PT - Cognitive impairments: No apparent impairments                         Following commands: Intact       Cueing       General Comments      Exercises Total Joint Exercises Ankle Circles/Pumps: AROM, Both, 10 reps Quad Sets: AROM, Right, 5 reps Short Arc Quad: AROM, Right, 5 reps Heel Slides: AROM, Right, 5 reps Hip ABduction/ADduction: AROM, Right, 5 reps Straight Leg Raises: AROM, Right, 5 reps   Assessment/Plan    PT Assessment Patient needs continued PT services  PT Problem List Decreased strength;Decreased range of motion;Decreased activity tolerance;Decreased balance;Decreased mobility;Pain       PT Treatment Interventions DME instruction;Gait training;Stair training;Functional mobility training;Therapeutic activities;Therapeutic exercise;Balance training;Neuromuscular re-education;Patient/family education;Modalities    PT Goals (Current goals can be found in the Care Plan section)  Acute Rehab PT Goals Patient Stated Goal: to be able  to go shoping PT Goal Formulation: With patient Time For Goal Achievement: 04/13/24 Potential to Achieve Goals: Good    Frequency 7X/week     Co-evaluation               AM-PAC PT 6 Clicks Mobility  Outcome Measure Help needed turning from your back to your side while in a flat bed without using bedrails?: None Help needed moving from lying on your back to sitting on the side of a flat bed without using bedrails?: A Little Help needed moving to and from a bed to a chair (including a wheelchair)?: A Little Help needed standing up from a chair using your arms (e.g., wheelchair or bedside chair)?: A Little Help needed to walk in hospital room?: Total Help needed climbing 3-5 steps with a railing? : Total 6 Click Score: 15    End of Session Equipment Utilized During Treatment: Gait belt Activity Tolerance: Other (comment) (symptomatic orthostatic hypotension) Patient left: in chair;with call bell/phone within reach;with family/visitor present Nurse Communication: Mobility status;Other (comment) (Bp findings  and pt physiological response) PT Visit Diagnosis: Unsteadiness on feet (R26.81);Other abnormalities of gait and mobility (R26.89);Muscle weakness (generalized) (M62.81);Difficulty in walking, not elsewhere classified (R26.2);Pain Pain - Right/Left:  Right Pain - part of body: Knee;Leg    Time: 8495-8449 PT Time Calculation (min) (ACUTE ONLY): 46 min   Charges:   PT Evaluation $PT Eval Low Complexity: 1 Low PT Treatments $Therapeutic Exercise: 8-22 mins $Therapeutic Activity: 8-22 mins PT General Charges $$ ACUTE PT VISIT: 1 Visit         [place], PT Acute Rehab   [place] VEAR Drone 03/30/2024, 4:03 PM

## 2024-03-30 NOTE — Discharge Instructions (Signed)

## 2024-03-30 NOTE — Anesthesia Procedure Notes (Signed)
 Procedure Name: LMA Insertion Date/Time: 03/30/2024 10:12 AM  Performed by: Joshua Vernell BROCKS, CRNAPre-anesthesia Checklist: Patient identified, Emergency Drugs available, Suction available and Patient being monitored Patient Re-evaluated:Patient Re-evaluated prior to induction Oxygen  Delivery Method: Circle system utilized Preoxygenation: Pre-oxygenation with 100% oxygen  Induction Type: IV induction Ventilation: Mask ventilation without difficulty LMA: LMA flexible inserted and LMA inserted LMA Size: 4.0 Number of attempts: 1 Placement Confirmation: positive ETCO2 and breath sounds checked- equal and bilateral Tube secured with: Tape Dental Injury: Teeth and Oropharynx as per pre-operative assessment

## 2024-03-30 NOTE — Anesthesia Procedure Notes (Signed)
 Procedure Name: MAC Date/Time: 03/30/2024 9:57 AM  Performed by: Joshua Vernell BROCKS, CRNAPre-anesthesia Checklist: Patient identified, Emergency Drugs available, Suction available and Patient being monitored Patient Re-evaluated:Patient Re-evaluated prior to induction Oxygen  Delivery Method: Simple face mask Placement Confirmation: positive ETCO2 and breath sounds checked- equal and bilateral Dental Injury: Teeth and Oropharynx as per pre-operative assessment

## 2024-03-30 NOTE — Transfer of Care (Signed)
 Immediate Anesthesia Transfer of Care Note  Patient: Laura Carter  Procedure(s) Performed: Procedure(s): ARTHROPLASTY, KNEE, TOTAL (Right)  Patient Location: PACU  Anesthesia Type:General and Regional  Level of Consciousness: Patient easily awoken, comfortable, cooperative, following commands, responds to stimulation.   Airway & Oxygen  Therapy: Patient spontaneously breathing, ventilating well, oxygen  via simple oxygen  mask.  Post-op Assessment: Report given to PACU RN, vital signs reviewed and stable, moving all extremities.   Post vital signs: Reviewed and stable.  Complications: No apparent anesthesia complications  Last Vitals:  Vitals Value Taken Time  BP 125/76 03/30/24 11:49  Temp    Pulse 77 03/30/24 11:54  Resp 21 03/30/24 11:54  SpO2 88 % 03/30/24 11:54  Vitals shown include unfiled device data.  Last Pain:  Vitals:   03/30/24 0856  TempSrc:   PainSc: 0-No pain         Complications: No notable events documented.

## 2024-03-30 NOTE — Interval H&P Note (Signed)
 History and Physical Interval Note:  03/30/2024 9:08 AM  Laura Carter  has presented today for surgery, with the diagnosis of OA RIGHT KNEE.  The various methods of treatment have been discussed with the patient and family. After consideration of risks, benefits and other options for treatment, the patient has consented to  Procedure(s): ARTHROPLASTY, KNEE, TOTAL (Right) as a surgical intervention.  The patient's history has been reviewed, patient examined, no change in status, stable for surgery.  I have reviewed the patient's chart and labs.  Questions were answered to the patient's satisfaction.     [name]

## 2024-03-31 ENCOUNTER — Encounter (HOSPITAL_COMMUNITY)

## 2024-03-31 ENCOUNTER — Encounter (HOSPITAL_COMMUNITY): Payer: Self-pay | Admitting: Orthopedic Surgery

## 2024-04-06 DIAGNOSIS — M25561 Pain in right knee: Secondary | ICD-10-CM | POA: Diagnosis not present

## 2024-04-06 DIAGNOSIS — Z4789 Encounter for other orthopedic aftercare: Secondary | ICD-10-CM | POA: Diagnosis not present

## 2024-04-06 DIAGNOSIS — M6281 Muscle weakness (generalized): Secondary | ICD-10-CM | POA: Diagnosis not present

## 2024-04-06 DIAGNOSIS — M25661 Stiffness of right knee, not elsewhere classified: Secondary | ICD-10-CM | POA: Diagnosis not present

## 2024-04-08 DIAGNOSIS — Z4789 Encounter for other orthopedic aftercare: Secondary | ICD-10-CM | POA: Diagnosis not present

## 2024-04-08 DIAGNOSIS — M25661 Stiffness of right knee, not elsewhere classified: Secondary | ICD-10-CM | POA: Diagnosis not present

## 2024-04-08 DIAGNOSIS — M25561 Pain in right knee: Secondary | ICD-10-CM | POA: Diagnosis not present

## 2024-04-08 DIAGNOSIS — M6281 Muscle weakness (generalized): Secondary | ICD-10-CM | POA: Diagnosis not present

## 2024-04-12 DIAGNOSIS — Z96651 Presence of right artificial knee joint: Secondary | ICD-10-CM | POA: Diagnosis not present

## 2024-04-12 DIAGNOSIS — R053 Chronic cough: Secondary | ICD-10-CM | POA: Diagnosis not present

## 2024-04-12 DIAGNOSIS — J4521 Mild intermittent asthma with (acute) exacerbation: Secondary | ICD-10-CM | POA: Diagnosis not present

## 2024-04-13 DIAGNOSIS — R053 Chronic cough: Secondary | ICD-10-CM | POA: Diagnosis not present

## 2024-04-13 DIAGNOSIS — Z4789 Encounter for other orthopedic aftercare: Secondary | ICD-10-CM | POA: Diagnosis not present

## 2024-04-13 DIAGNOSIS — M25661 Stiffness of right knee, not elsewhere classified: Secondary | ICD-10-CM | POA: Diagnosis not present

## 2024-04-13 DIAGNOSIS — M25561 Pain in right knee: Secondary | ICD-10-CM | POA: Diagnosis not present

## 2024-04-13 DIAGNOSIS — K449 Diaphragmatic hernia without obstruction or gangrene: Secondary | ICD-10-CM | POA: Diagnosis not present

## 2024-04-13 DIAGNOSIS — M6281 Muscle weakness (generalized): Secondary | ICD-10-CM | POA: Diagnosis not present

## 2024-04-13 DIAGNOSIS — J4521 Mild intermittent asthma with (acute) exacerbation: Secondary | ICD-10-CM | POA: Diagnosis not present

## 2024-04-13 DIAGNOSIS — I700 Atherosclerosis of aorta: Secondary | ICD-10-CM | POA: Diagnosis not present

## 2024-04-14 ENCOUNTER — Other Ambulatory Visit: Payer: Self-pay

## 2024-04-14 ENCOUNTER — Observation Stay (HOSPITAL_COMMUNITY)

## 2024-04-14 ENCOUNTER — Emergency Department (HOSPITAL_COMMUNITY)

## 2024-04-14 ENCOUNTER — Inpatient Hospital Stay (HOSPITAL_COMMUNITY)
Admission: EM | Admit: 2024-04-14 | Discharge: 2024-04-18 | DRG: 378 | Disposition: A | Discharge status: Home / Self Care | Source: Skilled Nursing Facility | Attending: Internal Medicine | Admitting: Internal Medicine

## 2024-04-14 ENCOUNTER — Other Ambulatory Visit (HOSPITAL_COMMUNITY): Payer: Self-pay

## 2024-04-14 ENCOUNTER — Encounter (HOSPITAL_COMMUNITY): Payer: Self-pay

## 2024-04-14 DIAGNOSIS — D122 Benign neoplasm of ascending colon: Secondary | ICD-10-CM | POA: Diagnosis present

## 2024-04-14 DIAGNOSIS — R5383 Other fatigue: Secondary | ICD-10-CM | POA: Diagnosis not present

## 2024-04-14 DIAGNOSIS — I10 Essential (primary) hypertension: Secondary | ICD-10-CM | POA: Diagnosis present

## 2024-04-14 DIAGNOSIS — Z6832 Body mass index (BMI) 32.0-32.9, adult: Secondary | ICD-10-CM

## 2024-04-14 DIAGNOSIS — K621 Rectal polyp: Secondary | ICD-10-CM | POA: Diagnosis not present

## 2024-04-14 DIAGNOSIS — Z7982 Long term (current) use of aspirin: Secondary | ICD-10-CM

## 2024-04-14 DIAGNOSIS — Z85828 Personal history of other malignant neoplasm of skin: Secondary | ICD-10-CM

## 2024-04-14 DIAGNOSIS — I110 Hypertensive heart disease with heart failure: Secondary | ICD-10-CM | POA: Diagnosis present

## 2024-04-14 DIAGNOSIS — E66811 Obesity, class 1: Secondary | ICD-10-CM | POA: Diagnosis present

## 2024-04-14 DIAGNOSIS — E6609 Other obesity due to excess calories: Secondary | ICD-10-CM

## 2024-04-14 DIAGNOSIS — R42 Dizziness and giddiness: Secondary | ICD-10-CM | POA: Diagnosis not present

## 2024-04-14 DIAGNOSIS — D128 Benign neoplasm of rectum: Secondary | ICD-10-CM | POA: Diagnosis present

## 2024-04-14 DIAGNOSIS — Z96651 Presence of right artificial knee joint: Secondary | ICD-10-CM

## 2024-04-14 DIAGNOSIS — K922 Gastrointestinal hemorrhage, unspecified: Principal | ICD-10-CM | POA: Diagnosis present

## 2024-04-14 DIAGNOSIS — Z800 Family history of malignant neoplasm of digestive organs: Secondary | ICD-10-CM

## 2024-04-14 DIAGNOSIS — D123 Benign neoplasm of transverse colon: Secondary | ICD-10-CM | POA: Diagnosis present

## 2024-04-14 DIAGNOSIS — I5189 Other ill-defined heart diseases: Secondary | ICD-10-CM

## 2024-04-14 DIAGNOSIS — Z87891 Personal history of nicotine dependence: Secondary | ICD-10-CM | POA: Diagnosis not present

## 2024-04-14 DIAGNOSIS — J4520 Mild intermittent asthma, uncomplicated: Secondary | ICD-10-CM | POA: Diagnosis present

## 2024-04-14 DIAGNOSIS — Z8249 Family history of ischemic heart disease and other diseases of the circulatory system: Secondary | ICD-10-CM

## 2024-04-14 DIAGNOSIS — J45909 Unspecified asthma, uncomplicated: Secondary | ICD-10-CM | POA: Diagnosis not present

## 2024-04-14 DIAGNOSIS — R58 Hemorrhage, not elsewhere classified: Secondary | ICD-10-CM | POA: Diagnosis not present

## 2024-04-14 DIAGNOSIS — K6389 Other specified diseases of intestine: Secondary | ICD-10-CM | POA: Diagnosis present

## 2024-04-14 DIAGNOSIS — I5032 Chronic diastolic (congestive) heart failure: Secondary | ICD-10-CM | POA: Diagnosis present

## 2024-04-14 DIAGNOSIS — K5730 Diverticulosis of large intestine without perforation or abscess without bleeding: Secondary | ICD-10-CM | POA: Diagnosis not present

## 2024-04-14 DIAGNOSIS — Z96653 Presence of artificial knee joint, bilateral: Secondary | ICD-10-CM | POA: Diagnosis present

## 2024-04-14 DIAGNOSIS — K5731 Diverticulosis of large intestine without perforation or abscess with bleeding: Secondary | ICD-10-CM | POA: Diagnosis present

## 2024-04-14 DIAGNOSIS — D62 Acute posthemorrhagic anemia: Secondary | ICD-10-CM | POA: Diagnosis present

## 2024-04-14 DIAGNOSIS — I959 Hypotension, unspecified: Secondary | ICD-10-CM | POA: Diagnosis present

## 2024-04-14 DIAGNOSIS — Z803 Family history of malignant neoplasm of breast: Secondary | ICD-10-CM

## 2024-04-14 DIAGNOSIS — D120 Benign neoplasm of cecum: Secondary | ICD-10-CM | POA: Diagnosis present

## 2024-04-14 DIAGNOSIS — Z7951 Long term (current) use of inhaled steroids: Secondary | ICD-10-CM | POA: Diagnosis not present

## 2024-04-14 DIAGNOSIS — Z79899 Other long term (current) drug therapy: Secondary | ICD-10-CM | POA: Diagnosis not present

## 2024-04-14 DIAGNOSIS — Z555 Less than a high school diploma: Secondary | ICD-10-CM

## 2024-04-14 DIAGNOSIS — C187 Malignant neoplasm of sigmoid colon: Principal | ICD-10-CM | POA: Diagnosis present

## 2024-04-14 DIAGNOSIS — G4730 Sleep apnea, unspecified: Secondary | ICD-10-CM | POA: Diagnosis present

## 2024-04-14 DIAGNOSIS — D125 Benign neoplasm of sigmoid colon: Secondary | ICD-10-CM | POA: Diagnosis present

## 2024-04-14 LAB — COMPREHENSIVE METABOLIC PANEL WITH GFR
ALT: 17 U/L (ref 0–44)
AST: 19 U/L (ref 15–41)
Albumin: 3.2 g/dL — ABNORMAL LOW (ref 3.5–5.0)
Alkaline Phosphatase: 78 U/L (ref 38–126)
Anion gap: 10 (ref 5–15)
BUN: 23 mg/dL (ref 8–23)
CO2: 21 mmol/L — ABNORMAL LOW (ref 22–32)
Calcium: 8.4 mg/dL — ABNORMAL LOW (ref 8.9–10.3)
Chloride: 111 mmol/L (ref 98–111)
Creatinine, Ser: 0.96 mg/dL (ref 0.44–1.00)
GFR, Estimated: 58 mL/min — ABNORMAL LOW (ref >60)
Glucose, Bld: 149 mg/dL — ABNORMAL HIGH (ref 70–99)
Potassium: 4.4 mmol/L (ref 3.5–5.1)
Sodium: 142 mmol/L (ref 135–145)
Total Bilirubin: 0.4 mg/dL (ref 0.0–1.2)
Total Protein: 5.3 g/dL — ABNORMAL LOW (ref 6.5–8.1)

## 2024-04-14 LAB — HEMOGLOBIN AND HEMATOCRIT, BLOOD
HCT: 32.7 % — ABNORMAL LOW (ref 36.0–46.0)
Hemoglobin: 10.5 g/dL — ABNORMAL LOW (ref 12.0–15.0)

## 2024-04-14 LAB — CBC WITH DIFFERENTIAL/PLATELET
Abs Immature Granulocytes: 0.03 K/uL (ref 0.00–0.07)
Basophils Absolute: 0 K/uL (ref 0.0–0.1)
Basophils Relative: 1 %
Eosinophils Absolute: 0.1 K/uL (ref 0.0–0.5)
Eosinophils Relative: 2 %
HCT: 27.4 % — ABNORMAL LOW (ref 36.0–46.0)
Hemoglobin: 8.5 g/dL — ABNORMAL LOW (ref 12.0–15.0)
Immature Granulocytes: 1 %
Lymphocytes Relative: 15 %
Lymphs Abs: 0.7 K/uL (ref 0.7–4.0)
MCH: 29.3 pg (ref 26.0–34.0)
MCHC: 31 g/dL (ref 30.0–36.0)
MCV: 94.5 fL (ref 80.0–100.0)
Monocytes Absolute: 0.4 K/uL (ref 0.1–1.0)
Monocytes Relative: 8 %
Neutro Abs: 3.6 K/uL (ref 1.7–7.7)
Neutrophils Relative %: 73 %
Platelets: 314 K/uL (ref 150–400)
RBC: 2.9 MIL/uL — ABNORMAL LOW (ref 3.87–5.11)
RDW: 15.3 % (ref 11.5–15.5)
WBC: 4.8 K/uL (ref 4.0–10.5)
nRBC: 0 % (ref 0.0–0.2)

## 2024-04-14 LAB — FERRITIN: Ferritin: 96 ng/mL (ref 11–307)

## 2024-04-14 LAB — I-STAT CHEM 8, ED
BUN: 26 mg/dL — ABNORMAL HIGH (ref 8–23)
Calcium, Ion: 1.18 mmol/L (ref 1.15–1.40)
Chloride: 111 mmol/L (ref 98–111)
Creatinine, Ser: 1 mg/dL (ref 0.44–1.00)
Glucose, Bld: 145 mg/dL — ABNORMAL HIGH (ref 70–99)
HCT: 25 % — ABNORMAL LOW (ref 36.0–46.0)
Hemoglobin: 8.5 g/dL — ABNORMAL LOW (ref 12.0–15.0)
Potassium: 4.4 mmol/L (ref 3.5–5.1)
Sodium: 141 mmol/L (ref 135–145)
TCO2: 21 mmol/L — ABNORMAL LOW (ref 22–32)

## 2024-04-14 LAB — RETICULOCYTES
Immature Retic Fract: 29.4 % — ABNORMAL HIGH (ref 2.3–15.9)
RBC.: 2.93 MIL/uL — ABNORMAL LOW (ref 3.87–5.11)
Retic Count, Absolute: 96.1 K/uL (ref 19.0–186.0)
Retic Ct Pct: 3.3 % — ABNORMAL HIGH (ref 0.4–3.1)

## 2024-04-14 LAB — PREPARE RBC (CROSSMATCH)

## 2024-04-14 LAB — IRON AND TIBC
Iron: 41 ug/dL (ref 28–170)
Saturation Ratios: 15 % (ref 10.4–31.8)
TIBC: 263 ug/dL (ref 250–450)
UIBC: 223 ug/dL

## 2024-04-14 LAB — ABO/RH: ABO/RH(D): A POS

## 2024-04-14 MED ORDER — ASPIRIN 81 MG PO TBEC: 81.0000 mg | DELAYED_RELEASE_TABLET | Freq: Two times a day (BID) | ORAL | Status: DC

## 2024-04-14 MED ORDER — IOHEXOL 350 MG/ML SOLN
100.0000 mL | Freq: Once | INTRAVENOUS | Status: AC | PRN
  Administered 2024-04-14: 100 mL via INTRAVENOUS

## 2024-04-14 MED ORDER — HYDROCODONE-ACETAMINOPHEN 10-325 MG PO TABS: 1.0000 | ORAL_TABLET | ORAL | Status: DC | PRN

## 2024-04-14 MED ORDER — METOPROLOL SUCCINATE ER 25 MG PO TB24: 25.0000 mg | ORAL_TABLET | Freq: Every day | ORAL | Status: DC

## 2024-04-14 MED ORDER — PANTOPRAZOLE SODIUM 40 MG PO TBEC
40.0000 mg | DELAYED_RELEASE_TABLET | Freq: Every day | ORAL | Status: DC
  Administered 2024-04-14 – 2024-04-17 (×4): 40 mg via ORAL
  Filled 2024-04-14 (×5): qty 1

## 2024-04-14 MED ORDER — ACETAMINOPHEN 325 MG PO TABS: 650.0000 mg | ORAL_TABLET | Freq: Four times a day (QID) | ORAL | Status: DC | PRN

## 2024-04-14 MED ORDER — ACETAMINOPHEN 650 MG RE SUPP
650.0000 mg | Freq: Four times a day (QID) | RECTAL | Status: DC | PRN
Start: 2024-04-14 — End: 2024-04-18

## 2024-04-14 MED ORDER — ONDANSETRON HCL 4 MG PO TABS: 4.0000 mg | ORAL_TABLET | Freq: Four times a day (QID) | ORAL | Status: DC | PRN

## 2024-04-14 MED ORDER — SODIUM CHLORIDE (PF) 0.9 % IJ SOLN
INTRAMUSCULAR | Status: AC
  Filled 2024-04-14: qty 50

## 2024-04-14 MED ORDER — FLUTICASONE FUROATE-VILANTEROL 100-25 MCG/ACT IN AEPB
1.0000 | INHALATION_SPRAY | Freq: Every day | RESPIRATORY_TRACT | Status: DC
  Administered 2024-04-15 – 2024-04-18 (×4): 1 via RESPIRATORY_TRACT
  Filled 2024-04-14: qty 28

## 2024-04-14 MED ORDER — ALBUTEROL SULFATE (2.5 MG/3ML) 0.083% IN NEBU
3.0000 mL | INHALATION_SOLUTION | Freq: Four times a day (QID) | RESPIRATORY_TRACT | Status: DC | PRN
  Administered 2024-04-14: 3 mL via RESPIRATORY_TRACT
  Filled 2024-04-14: qty 3

## 2024-04-14 MED ORDER — METHOCARBAMOL 500 MG PO TABS: 750.0000 mg | ORAL_TABLET | Freq: Three times a day (TID) | ORAL | Status: DC | PRN

## 2024-04-14 MED ORDER — SODIUM CHLORIDE 0.9% IV SOLUTION: Freq: Once | INTRAVENOUS | Status: AC

## 2024-04-14 MED ORDER — ONDANSETRON HCL 4 MG/2ML IJ SOLN: 4.0000 mg | Freq: Four times a day (QID) | INTRAMUSCULAR | Status: DC | PRN

## 2024-04-14 NOTE — Progress Notes (Signed)
   04/14/24 2023  BiPAP/CPAP/SIPAP  BiPAP/CPAP/SIPAP Pt Type Adult  Reason BIPAP/CPAP not in use Non-compliant   Pt stated she would rather wear O2, that she can not tolerate CPAP. Pt placed on 2L [place] at this time.

## 2024-04-14 NOTE — H&P (Signed)
 History and Physical    Laura Carter FMW:[id number] DOB: 1940/10/10 DOA: 04/14/2024  PCP: Rolinda Millman, MD (Confirm with patient/family/NH records and if not entered, this has to be entered at [facility] point of entry) Patient coming from: Home  I have personally briefly reviewed patient's old medical records in [facility]  Chief Complaint: Bright blood per rectum  HPI: Laura Carter is a 83 y.o. female with medical history significant of recent right knee TKR secondary to severe OA, HTN, chronic HFpEF, mild intermittent asthma, presented with acute onset of lower GI bleed.  Patient had 3 moderate to large amount of bright red blood per rectum in short succession this morning.  She reported the second bowel movement was in rather large amount and bright red and the third bowel movement was smaller in amount and light-colored with 1 blood clot.  She denied any abdominal pain no nauseous vomiting, no constipation as she had a normal bowel movement yesterday.  Lately about 2 weeks ago patient underwent a right knee TKR without event.  Her after surgery pain has been fairly controlled with meloxicam, she has been doing well and she stopped taking meloxicam sometime last week.  And she is not taking narcotic either.  Patient denied any chest pain shortness of breath lightheadedness.  ED Course: Afebrile, nontachycardic blood pressure 120/66/98% room air.  CTA abdomen showed no active bleeding, diffuse diverticulosis.  Blood work showed hemoglobin 8.5 compared to baseline 13.7 of about 1 months ago, WBC 4.8 BUN 23, creatinine 0.9 bicarb 20 lactate 4.4.  1 unit PRBC ordered by ED physician.  Review of Systems: As per HPI otherwise 14 point review of systems negative.    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 needle biopsy only   CHOLECYSTECTOMY  02/2009   COLONOSCOPY     ESOPHAGOGASTRODUODENOSCOPY     EYE SURGERY     bilateral   KNEE ARTHROSCOPY WITH MEDIAL MENISECTOMY Left 06/16/2015   Procedure: KNEE ARTHROSCOPY WITH PARTIAL MEDIAL MENISECTOMY;  Surgeon: Taft FORBES Minerva, MD;  Location: [facility];  Service: Orthopedics;  Laterality: Left;   PARTIAL KNEE ARTHROPLASTY Left 07/08/2017   Procedure: UNICOMPARTMENTAL KNEE;  Surgeon: Beverley Evalene BIRCH, MD;  Location: [facility];  Service: Orthopedics;  Laterality: Left;   TOTAL KNEE ARTHROPLASTY Right 03/30/2024   Procedure: ARTHROPLASTY, KNEE, TOTAL;  Surgeon: Beverley Evalene BIRCH, MD;  Location: [facility];  Service: Orthopedics;  Laterality: Right;     reports that she quit smoking about 58 years ago. Her smoking use included cigarettes. She started smoking about 68 years ago. She has a 10 pack-year smoking history. She has never used smokeless tobacco. She reports that she does not drink alcohol and does not use drugs.  No Known Allergies  Family History  Problem Relation Age of Onset   Cancer Mother        breast   Stomach cancer Mother    Breast cancer Mother    Heart disease Father    Heart disease Sister    Breast cancer Sister     Prior to  Admission medications   Medication Sig Start Date End Date Taking? Authorizing Provider  acetaminophen  (TYLENOL ) 500 MG tablet Take 2 tablets (1,000 mg total) by mouth 2 (two) times daily as needed for mild pain (pain score 1-3) or moderate pain (pain score 4-6). 03/30/24   Deward Eck, PA-C  albuterol  (VENTOLIN  HFA) 108 (90 Base) MCG/ACT inhaler Inhale 2 puffs into the lungs every 6 (six) hours as needed. 08/07/23   Leath-Warren, Etta PARAS, NP  ALLERGY RELIEF 10 MG tablet Take 10 mg by mouth daily as needed for allergies. 12/26/23   [provider]  APPLE CIDER VINEGAR PO Take 15 mLs by mouth  daily.    [provider]  ascorbic acid (VITAMIN C) 500 MG tablet Take 1,000 mg by mouth daily.    [provider]  ASHWAGANDHA PO Take 2 tablets by mouth daily.    [provider]  aspirin  EC 81 MG tablet Take 1 tablet (81 mg total) by mouth 2 (two) times daily. To prevent blood clots for 30 days after surgery. 03/30/24   Deward Eck, PA-C  azelastine  (ASTELIN ) 0.1 % nasal spray Place 1 spray into both nostrils 2 (two) times daily. Use in each nostril as directed Patient taking differently: Place 1 spray into both nostrils 2 (two) times daily as needed for allergies or rhinitis. 01/12/24   Stuart Vernell Norris, PA-C  b complex vitamins capsule Take 2 capsules by mouth daily.    [provider]  esomeprazole (NEXIUM) 20 MG capsule Take 20 mg by mouth at bedtime.    [provider]  fluticasone -salmeterol (ADVAIR DISKUS) 100-50 MCG/ACT AEPB Inhale 1 puff into the lungs 2 (two) times daily as needed (shortness of breath).    [provider]  HYDROcodone -acetaminophen  (NORCO) 10-325 MG tablet Take 1 tablet by mouth every 4 (four) hours as needed for severe pain (pain score 7-10). in knee after surgery 03/30/24   Deward Eck, PA-C  meloxicam (MOBIC) 15 MG tablet Take 1 tablet (15 mg total) by mouth daily as needed for pain (and inflammation). 03/30/24   Deward Eck, PA-C  methocarbamol  (ROBAXIN ) 750 MG tablet Take 1 tablet (750 mg total) by mouth every 8 (eight) hours as needed for muscle spasms. 03/30/24   Deward Eck, PA-C  metoprolol  succinate (TOPROL -XL) 25 MG 24 hr tablet TAKE (1) TABLET BY MOUTH ONCE DAILY. 06/30/17   Duanne Butler DASEN, MD  NON FORMULARY Pt uses a cpap nightly    [provider]  ondansetron  (ZOFRAN -ODT) 4 MG disintegrating tablet Take 1 tablet (4 mg total) by mouth every 8 (eight) hours as needed for nausea or vomiting. 03/30/24   Deward Eck, PA-C    Physical Exam: Vitals:   04/14/24 0737 04/14/24 0741  BP:  119/66   Pulse:  91  Resp:  18  Temp:  (!) 97.4 F (36.3 C)  TempSrc:  Oral  SpO2:  98%  Weight: 82.6 kg   Height: 5' 3 (1.6 m)     Constitutional: NAD, calm, comfortable Vitals:   04/14/24 0737 04/14/24 0741  BP:  119/66  Pulse:  91  Resp:  18  Temp:  (!) 97.4 F (36.3 C)  TempSrc:  Oral  SpO2:  98%  Weight: 82.6 kg   Height: 5' 3 (1.6 m)    Eyes: PERRL, lids and conjunctivae normal ENMT: Mucous membranes are moist. Posterior pharynx clear of any exudate or lesions.Normal dentition.  Neck: normal, supple, no masses, no thyromegaly Respiratory: clear to auscultation bilaterally, no  wheezing, no crackles. Normal respiratory effort. No accessory muscle use.  Cardiovascular: Regular rate and rhythm, no murmurs / rubs / gallops. No extremity edema. 2+ pedal pulses. No carotid bruits.  Abdomen: no tenderness, no masses palpated. No hepatosplenomegaly. Bowel sounds positive.  Musculoskeletal: no clubbing / cyanosis. No joint deformity upper and lower extremities. Good ROM, no contractures. Normal muscle tone.  Skin: no rashes, lesions, ulcers. No induration. Right knee surgical site, clean, no swelling or rash or tenderness. Neurologic: CN 2-12 grossly intact. Sensation intact, DTR normal. Strength 5/5 in all 4.  Psychiatric: Normal judgment and insight. Alert and oriented x 3. Normal mood.     Labs on Admission: I have personally reviewed following labs and imaging studies  CBC: Recent Labs  Lab 04/14/24 0749 04/14/24 0820  WBC 4.8  --   NEUTROABS 3.6  --   HGB 8.5* 8.5*  HCT 27.4* 25.0*  MCV 94.5  --   PLT 314  --    Basic Metabolic Panel: Recent Labs  Lab 04/14/24 0749 04/14/24 0820  NA 142 141  K 4.4 4.4  CL 111 111  CO2 21*  --   GLUCOSE 149* 145*  BUN 23 26*  CREATININE 0.96 1.00  CALCIUM  8.4*  --    GFR: Estimated Creatinine Clearance: 42.6 mL/min (by C-G formula based on SCr of 1 mg/dL). Liver Function Tests: Recent Labs  Lab 04/14/24 0749  AST 19   ALT 17  ALKPHOS 78  BILITOT 0.4  PROT 5.3*  ALBUMIN 3.2*   No results for input(s): LIPASE, AMYLASE in the last 168 hours. No results for input(s): AMMONIA in the last 168 hours. Coagulation Profile: No results for input(s): INR, PROTIME in the last 168 hours. Cardiac Enzymes: No results for input(s): CKTOTAL, CKMB, CKMBINDEX, TROPONINI in the last 168 hours. BNP (last 3 results) No results for input(s): PROBNP in the last 8760 hours. HbA1C: No results for input(s): HGBA1C in the last 72 hours. CBG: No results for input(s): GLUCAP in the last 168 hours. Lipid Profile: No results for input(s): CHOL, HDL, LDLCALC, TRIG, CHOLHDL, LDLDIRECT in the last 72 hours. Thyroid  Function Tests: No results for input(s): TSH, T4TOTAL, FREET4, T3FREE, THYROIDAB in the last 72 hours. Anemia Panel: Recent Labs    04/14/24 0749  RETICCTPCT 3.3*   Urine analysis: No results found for: COLORURINE, APPEARANCEUR, LABSPEC, PHURINE, GLUCOSEU, HGBUR, BILIRUBINUR, KETONESUR, PROTEINUR, UROBILINOGEN, NITRITE, LEUKOCYTESUR  Radiological Exams on Admission: CT Angio Abd/Pel W and/or Wo Contrast Result Date: 04/14/2024 CLINICAL DATA:  Lower GI bleed. EXAM: CTA ABDOMEN AND PELVIS WITHOUT AND WITH CONTRAST TECHNIQUE: Multidetector CT imaging of the abdomen and pelvis was performed using the standard protocol during bolus administration of intravenous contrast. Multiplanar reconstructed images and MIPs were obtained and reviewed to evaluate the vascular anatomy. RADIATION DOSE REDUCTION: This exam was performed according to the departmental dose-optimization program which includes automated exposure control, adjustment of the mA and/or kV according to patient size and/or use of iterative reconstruction technique. CONTRAST:  100mL OMNIPAQUE IOHEXOL 350 MG/ML SOLN COMPARISON:  None Available. FINDINGS: VASCULAR Aorta: Moderate atherosclerotic  calcification of the abdominal aorta. No aneurysmal dilatation or dissection. No periaortic fluid collection. Celiac: The celiac trunk and its major branches are patent. SMA: The 7 is patent. Renals: The renal arteries are patent. 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 IMA: The IMA is patent. Inflow: Mild atherosclerotic calcification of the iliac arteries. No as well dilatation or dissection. The iliac arteries are patent. Proximal Outflow: The visualized  proximal outflow is patent. Veins: The IVC is unremarkable. No portal venous gas. The SMV the, splenic vein, and main portal vein are patent. Review of the MIP images confirms the above findings. NON-VASCULAR Lower chest: No acute abnormality. No intra-abdominal free air or free fluid. Hepatobiliary: The liver is unremarkable. No acute bony hypertension. Cholecystectomy. No retained calcified stone noted in the central CBD. Pancreas: Unremarkable. No pancreatic ductal dilatation or surrounding inflammatory changes. Spleen: Normal in size without focal abnormality. Adrenals/Urinary Tract: The adrenal glands are unremarkable. There is no hydronephrosis or nephrolithiasis on either side. Small bilateral renal cysts. The visualized ureters and urinary bladder probable. Stomach/Bowel: There is a large hiatal hernia containing a portion of the stomach. Severe sigmoid diverticulosis and scattered colonic diverticula. There is no bowel obstruction or active inflammation. The appendix is normal. Lymphatic: No adenopathy. Reproductive: The uterus is anteverted. No suspicious adnexal masses. Other: Fat containing bilateral inguinal hernias, left greater than right. No bowel herniation. Musculoskeletal: Degenerative changes of spine. No acute osseous pathology. IMPRESSION: 1. No acute intra-abdominal or pelvic pathology. No CT evidence of active GI bleed. 2. Severe sigmoid diverticulosis. No bowel obstruction. Normal appendix. 3. Large hiatal hernia containing a portion of  the stomach. 4.  Aortic Atherosclerosis (ICD10-I70.0). Electronically Signed   By: Vanetta Chou M.D.   On: 04/14/2024 08:50    EKG: Independently reviewed.  Sinus rhythm, no acute ST changes.  Assessment/Plan Principal Problem:   Lower GI bleed  (please populate well all problems here in Problem List. (For example, if patient is on BP meds at home and you resume or decide to hold them, it is a problem that needs to be her. Same for CAD, COPD, HLD and so on)  Hematochezia Acute blood loss anemia - Clinically suspect diverticulosis bleeding. - CTA negative for active bleeding. - Agreed with PRBC x 1 - Recheck H&H level this afternoon and tonight, and consider further transfusion for hemodynamic instability and drastic change of vital signs. - [facility] consulted in ED.  HTN Chronic HFpEF - Hold off metoprolol  today  Mild intermittent asthma - No symptoms or signs of acute exacerbation - Continue Advair and as needed albuterol   Right knee OA status post recent TKR - Surgical wound looks clean no signs of infection. - Home PT  DVT prophylaxis: SCD Code Status: Full code Family Communication: None at bedside Disposition Plan: Expect less than 2 midnight hospital stay Consults called: [facility] Admission status: Telemetry observation   Cort ONEIDA Mana MD [facility] Pager [phone]  04/14/2024, 10:05 AM

## 2024-04-14 NOTE — Consult Note (Addendum)
 Referring Provider: EDP Primary Care Physician:  Rolinda Millman, MD Primary Gastroenterologist:  Dr. Wilhelmenia  Reason for Consultation: GI bleed  HPI: Laura Carter is a 83 y.o. female with past medical history significant of recent right knee TKR earlier this month secondary to severe OA, HTN, chronic HFpEF, mild intermittent asthma, who presented with acute onset of lower GI bleed.   Patient had 3 moderate to large amount of bright red blood per rectum in short succession this morning from 1 AM to 6 AM, which was the last.  She denied any abdominal pain, no nauseous or vomiting, no constipation as she had a normal bowel movement yesterday.  Takes smooth move tea and some type of prune tea daily to help keep her bowels regular.  No black tarry stools.  Lately about 2 weeks ago patient underwent a right knee TKR without event.  Her after surgery pain has been fairly controlled with meloxicam, she has been doing well and she stopped taking meloxicam sometime last week.  She tells me that she only took the meloxicam when she really needed it, not regularly or multiple times a day.  Takes Nexium 20 mg daily and with that she denies any heartburn/reflux/indigestion, etc.   Hemoglobin 8.5 grams compared to baseline 13.7 grams a couple of weeks ago.  CTA negative for active bleeding, showed severe sigmoid diverticulosis.  She is currently getting 1 unit of packed red blood cells.   Past Surgical History:  Procedure Laterality Date   BREAST BIOPSY Left    years ago- no scar ? needle biopsy only   CHOLECYSTECTOMY  02/2009   COLONOSCOPY     ESOPHAGOGASTRODUODENOSCOPY     EYE SURGERY     bilateral   KNEE ARTHROSCOPY WITH MEDIAL MENISECTOMY Left 06/16/2015   Procedure: KNEE ARTHROSCOPY WITH PARTIAL MEDIAL MENISECTOMY;  Surgeon: Taft FORBES Minerva, MD;  Location: [facility];  Service: Orthopedics;  Laterality: Left;   PARTIAL KNEE ARTHROPLASTY Left 07/08/2017   Procedure: UNICOMPARTMENTAL KNEE;   Surgeon: Beverley Evalene BIRCH, MD;  Location: [facility];  Service: Orthopedics;  Laterality: Left;   TOTAL KNEE ARTHROPLASTY Right 03/30/2024   Procedure: ARTHROPLASTY, KNEE, TOTAL;  Surgeon: Beverley Evalene BIRCH, MD;  Location: [facility];  Service: Orthopedics;  Laterality: Right;    Prior to Admission medications   Medication Sig Start Date End Date Taking? Authorizing Provider  acetaminophen  (TYLENOL ) 500 MG tablet Take 2 tablets (1,000 mg total) by mouth 2 (two) times daily as needed for mild pain (pain score 1-3) or moderate pain (pain score 4-6). 03/30/24   Deward Eck, PA-C  albuterol  (VENTOLIN  HFA) 108 (90 Base) MCG/ACT inhaler Inhale 2 puffs into the lungs every 6 (six) hours as needed. 08/07/23   Leath-Warren, Etta PARAS, NP  ALLERGY RELIEF 10 MG tablet Take 10 mg by mouth daily as needed for allergies. 12/26/23   [provider]  APPLE CIDER VINEGAR PO Take 15 mLs by mouth daily.    [provider]  ascorbic acid (VITAMIN C) 500 MG tablet Take 1,000 mg by mouth daily.    [provider]  ASHWAGANDHA PO Take 2 tablets by mouth daily.    [provider]  aspirin  EC 81 MG tablet Take 1 tablet (81 mg total) by mouth 2 (two) times daily. To prevent blood clots for 30 days after surgery. 03/30/24   Deward Eck, PA-C  azelastine  (ASTELIN ) 0.1 % nasal spray Place 1 spray into both nostrils 2 (two) times daily. Use in each  nostril as directed Patient taking differently: Place 1 spray into both nostrils 2 (two) times daily as needed for allergies or rhinitis. 01/12/24   Stuart Vernell Norris, PA-C  b complex vitamins capsule Take 2 capsules by mouth daily.    [provider]  esomeprazole (NEXIUM) 20 MG capsule Take 20 mg by mouth at bedtime.    [provider]  fluticasone -salmeterol (ADVAIR DISKUS) 100-50 MCG/ACT AEPB Inhale 1 puff into the lungs 2 (two) times daily as needed (shortness of breath).    [provider]  HYDROcodone -acetaminophen   (NORCO) 10-325 MG tablet Take 1 tablet by mouth every 4 (four) hours as needed for severe pain (pain score 7-10). in knee after surgery 03/30/24   Deward Eck, PA-C  meloxicam  (MOBIC ) 15 MG tablet Take 1 tablet (15 mg total) by mouth daily as needed for pain (and inflammation). 03/30/24   Deward Eck, PA-C  methocarbamol  (ROBAXIN ) 750 MG tablet Take 1 tablet (750 mg total) by mouth every 8 (eight) hours as needed for muscle spasms. 03/30/24   Deward Eck, PA-C  metoprolol  succinate (TOPROL -XL) 25 MG 24 hr tablet TAKE (1) TABLET BY MOUTH ONCE DAILY. 06/30/17   Duanne Butler DASEN, MD  NON FORMULARY Pt uses a cpap nightly    [provider]  ondansetron  (ZOFRAN -ODT) 4 MG disintegrating tablet Take 1 tablet (4 mg total) by mouth every 8 (eight) hours as needed for nausea or vomiting. 03/30/24   Deward Eck, PA-C    Current Facility-Administered Medications  Medication Dose Route Frequency Provider Last Rate Last Admin   0.9 %  sodium chloride  infusion (Manually program via Guardrails IV Fluids)   Intravenous Once Mannie Pac T, DO       acetaminophen  (TYLENOL ) tablet 650 mg  650 mg Oral Q6H PRN Laurita Cort DASEN, MD       Or   acetaminophen  (TYLENOL ) suppository 650 mg  650 mg Rectal Q6H PRN Laurita Cort DASEN, MD       albuterol  (VENTOLIN  HFA) 108 (90 Base) MCG/ACT inhaler 2 puff  2 puff Inhalation Q6H PRN Laurita Cort DASEN, MD       aspirin  EC tablet 81 mg  81 mg Oral BID Laurita Cort DASEN, MD       fluticasone  furoate-vilanterol (BREO ELLIPTA ) 100-25 MCG/ACT 1 puff  1 puff Inhalation Daily Laurita Cort DASEN, MD       HYDROcodone -acetaminophen  (NORCO) 10-325 MG per tablet 1 tablet  1 tablet Oral Q4H PRN Laurita Cort DASEN, MD       methocarbamol  (ROBAXIN ) tablet 750 mg  750 mg Oral Q8H PRN Laurita Cort DASEN, MD       metoprolol  succinate (TOPROL -XL) 24 hr tablet 25 mg  25 mg Oral Daily Zhang, Ping T, MD       ondansetron  (ZOFRAN ) tablet 4 mg  4 mg Oral Q6H PRN Laurita Cort DASEN, MD       Or   ondansetron  (ZOFRAN ) injection  4 mg  4 mg Intravenous Q6H PRN Laurita Cort DASEN, MD       pantoprazole  (PROTONIX ) EC tablet 40 mg  40 mg Oral Daily Laurita Cort DASEN, MD       Current Outpatient Medications  Medication Sig Dispense Refill   acetaminophen  (TYLENOL ) 500 MG tablet Take 2 tablets (1,000 mg total) by mouth 2 (two) times daily as needed for mild pain (pain score 1-3) or moderate pain (pain score 4-6). 30 tablet 0   albuterol  (VENTOLIN  HFA) 108 (90 Base) MCG/ACT inhaler Inhale 2  puffs into the lungs every 6 (six) hours as needed. 8 g 0   ALLERGY RELIEF 10 MG tablet Take 10 mg by mouth daily as needed for allergies.     APPLE CIDER VINEGAR PO Take 15 mLs by mouth daily.     ascorbic acid (VITAMIN C) 500 MG tablet Take 1,000 mg by mouth daily.     ASHWAGANDHA PO Take 2 tablets by mouth daily.     aspirin  EC 81 MG tablet Take 1 tablet (81 mg total) by mouth 2 (two) times daily. To prevent blood clots for 30 days after surgery. 60 tablet 0   azelastine  (ASTELIN ) 0.1 % nasal spray Place 1 spray into both nostrils 2 (two) times daily. Use in each nostril as directed (Patient taking differently: Place 1 spray into both nostrils 2 (two) times daily as needed for allergies or rhinitis.) 30 mL 0   b complex vitamins capsule Take 2 capsules by mouth daily.     esomeprazole (NEXIUM) 20 MG capsule Take 20 mg by mouth at bedtime.     fluticasone -salmeterol (ADVAIR DISKUS) 100-50 MCG/ACT AEPB Inhale 1 puff into the lungs 2 (two) times daily as needed (shortness of breath).     HYDROcodone -acetaminophen  (NORCO) 10-325 MG tablet Take 1 tablet by mouth every 4 (four) hours as needed for severe pain (pain score 7-10). in knee after surgery 30 tablet 0   meloxicam (MOBIC) 15 MG tablet Take 1 tablet (15 mg total) by mouth daily as needed for pain (and inflammation). 30 tablet 0   methocarbamol  (ROBAXIN ) 750 MG tablet Take 1 tablet (750 mg total) by mouth every 8 (eight) hours as needed for muscle spasms. 21 tablet 0   metoprolol  succinate  (TOPROL -XL) 25 MG 24 hr tablet TAKE (1) TABLET BY MOUTH ONCE DAILY. 90 tablet 3   NON FORMULARY Pt uses a cpap nightly     ondansetron  (ZOFRAN -ODT) 4 MG disintegrating tablet Take 1 tablet (4 mg total) by mouth every 8 (eight) hours as needed for nausea or vomiting. 15 tablet 0    Allergies as of 04/14/2024   (No Known Allergies)    Family History  Problem Relation Age of Onset   Cancer Mother        breast   Stomach cancer Mother    Breast cancer Mother    Heart disease Father    Heart disease Sister    Breast cancer Sister     Social History   Socioeconomic History   Marital status: Married    Spouse name: Not on file   Number of children: 2   Years of education: Not on file   Highest education level: 10th grade  Occupational History   Not on file  Tobacco Use   Smoking status: Former    Current packs/day: 0.00    Average packs/day: 1 pack/day for 10.0 years (10.0 ttl pk-yrs)    Types: Cigarettes    Start date: 06/14/1955    Quit date: 06/13/1965    Years since quitting: 58.8   Smokeless tobacco: Never  Vaping Use   Vaping status: Never Used  Substance and Sexual Activity   Alcohol use: No   Drug use: No   Sexual activity: Never    Birth control/protection: None  Other Topics Concern   Not on file  Social History Narrative   Grew up in [place], [place]. Married. Has one daughter and one son. Has two grandchildren.    Did not work outside the home.   Eats  all food groups.    Wear seatbelt.    Went to [employer] and graduated from [employer] in [place].    Social Drivers of Corporate Investment Banker Strain: Not on file  Food Insecurity: Not on file  Transportation Needs: Not on file  Physical Activity: Not on file  Stress: Not on file  Social Connections: Not on file  Intimate Partner Violence: Not on file    Review of Systems: ROS is O/W negative except as mentioned in HPI.  Physical Exam: Vital signs in last 24 hours: Temp:  [97.4 F  (36.3 C)] 97.4 F (36.3 C) (11/19 0741) Pulse Rate:  [91] 91 (11/19 0741) Resp:  [18] 18 (11/19 0741) BP: (119)/(66) 119/66 (11/19 0741) SpO2:  [98 %] 98 % (11/19 0741) Weight:  [82.6 kg] 82.6 kg (11/19 0737)   General:  Alert, Well-developed, well-nourished, pleasant and cooperative in NAD Head:  Normocephalic and atraumatic. Eyes:  Sclera clear, no icterus.  Conjunctiva pink. Ears:  Normal auditory acuity. Mouth:  No deformity or lesions.   Lungs:  Clear throughout to auscultation.  No wheezes, crackles, or rhonchi.  Heart:  Regular rate and rhythm; no murmurs, clicks, rubs, or gallops. Abdomen:  Soft, non-distended.  BS present.  Non-tender. Msk:  Symmetrical without gross deformities.  Pulses:  Normal pulses noted. Extremities:  Without clubbing or edema.  Right knew with suturing from recent TKA. Neurologic:  Alert and oriented x 4;  grossly normal neurologically. Skin:  Intact without significant lesions or rashes. Psych:  Alert and cooperative. Normal mood and affect.  Lab Results: Recent Labs    04/14/24 0749 04/14/24 0820  WBC 4.8  --   HGB 8.5* 8.5*  HCT 27.4* 25.0*  PLT 314  --    BMET Recent Labs    04/14/24 0749 04/14/24 0820  NA 142 141  K 4.4 4.4  CL 111 111  CO2 21*  --   GLUCOSE 149* 145*  BUN 23 26*  CREATININE 0.96 1.00  CALCIUM  8.4*  --    LFT Recent Labs    04/14/24 0749  PROT 5.3*  ALBUMIN 3.2*  AST 19  ALT 17  ALKPHOS 78  BILITOT 0.4   Studies/Results: CT Angio Abd/Pel W and/or Wo Contrast Result Date: 04/14/2024 CLINICAL DATA:  Lower GI bleed. EXAM: CTA ABDOMEN AND PELVIS WITHOUT AND WITH CONTRAST TECHNIQUE: Multidetector CT imaging of the abdomen and pelvis was performed using the standard protocol during bolus administration of intravenous contrast. Multiplanar reconstructed images and MIPs were obtained and reviewed to evaluate the vascular anatomy. RADIATION DOSE REDUCTION: This exam was performed according to the departmental  dose-optimization program which includes automated exposure control, adjustment of the mA and/or kV according to patient size and/or use of iterative reconstruction technique. CONTRAST:  100mL OMNIPAQUE IOHEXOL 350 MG/ML SOLN COMPARISON:  None Available. FINDINGS: VASCULAR Aorta: Moderate atherosclerotic calcification of the abdominal aorta. No aneurysmal dilatation or dissection. No periaortic fluid collection. Celiac: The celiac trunk and its major branches are patent. SMA: The 7 is patent. Renals: The renal arteries are patent. 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 IMA: The IMA is patent. Inflow: Mild atherosclerotic calcification of the iliac arteries. No as well dilatation or dissection. The iliac arteries are patent. Proximal Outflow: The visualized proximal outflow is patent. Veins: The IVC is unremarkable. No portal venous gas. The SMV the, splenic vein, and main portal vein are patent. Review of the MIP images confirms the above findings. NON-VASCULAR Lower chest: No  acute abnormality. No intra-abdominal free air or free fluid. Hepatobiliary: The liver is unremarkable. No acute bony hypertension. Cholecystectomy. No retained calcified stone noted in the central CBD. Pancreas: Unremarkable. No pancreatic ductal dilatation or surrounding inflammatory changes. Spleen: Normal in size without focal abnormality. Adrenals/Urinary Tract: The adrenal glands are unremarkable. There is no hydronephrosis or nephrolithiasis on either side. Small bilateral renal cysts. The visualized ureters and urinary bladder probable. Stomach/Bowel: There is a large hiatal hernia containing a portion of the stomach. Severe sigmoid diverticulosis and scattered colonic diverticula. There is no bowel obstruction or active inflammation. The appendix is normal. Lymphatic: No adenopathy. Reproductive: The uterus is anteverted. No suspicious adnexal masses. Other: Fat containing bilateral inguinal hernias, left greater than right. No bowel  herniation. Musculoskeletal: Degenerative changes of spine. No acute osseous pathology. IMPRESSION: 1. No acute intra-abdominal or pelvic pathology. No CT evidence of active GI bleed. 2. Severe sigmoid diverticulosis. No bowel obstruction. Normal appendix. 3. Large hiatal hernia containing a portion of the stomach. 4.  Aortic Atherosclerosis (ICD10-I70.0). Electronically Signed   By: Vanetta Chou M.D.   On: 04/14/2024 08:50   IMPRESSION:  83 year old female with GI bleed.  Seems to be lower in origin with 3 bloody bowel movements.  CTA negative for active bleeding, but shows severe sigmoid diverticular disease.  Hemoglobin 8.5 g, which is down from 13.7 g just 2 weeks ago.  BUN is normal.  Suspect diverticular bleed.  Last colonoscopy 2000.  Getting one unit of PRBCs.  Large hiatal hernia containing a portion of the stomach seen on CT scan  Recent use of NSAIDs in the form of meloxicam for orthopedic surgery, but only used prn, not regularly and just for a short time.  Right total knee arthroplasty on 11/4  PLAN: - Monitor hemoglobin and transfuse further if needed. - They just have her on pantoprazole  40 mg by mouth daily for now.  She takes Nexium 20 mg daily at home. - Any endoscopic evaluation per Dr. Stacia, but discussed with patient and she prefers only to do colonoscopy as inpatient if bleeding does not stop.  She would prefer to follow-up as an outpatient to have it done in the near future. - If bleeding recurs briskly need to consider repeat CTA and IR intervention.   Harlene BIRCH. Zehr  04/14/2024, 10:12 AM  ----------------------------------------------------------------------------------------------------------  I have taken a history, reviewed the chart and examined the patient. I performed a substantive portion of this encounter, including complete performance of at least one of the key components, in conjunction with the APP. I agree with the APP's note, impression and  recommendations   83 year old female with history of hypertension, arthritis and asthma with recent total right knee replacement, who was brought to the ED this morning with multiple episodes of profuse painless hematochezia. Hemoglobin on presentation was 8.5 which is down from 13.7.  She likely had some hemoglobin drop following her knee replacement, so I do not think all of this drop in hemoglobin is secondary to her GI bleeding. She was hemodynamically stable in the ED and has remained so since admission. She has not passed any bloody stools since arriving to the ED. CTA with diffuse colonic diverticulosis but no active bleeding. She was given 1 unit PRBC.  Her last colonoscopy was over 20 years ago and was notable for diverticulosis, otherwise normal.  Her clinical presentation is very consistent with a diverticular bleed (abrupt onset profuse painless hematochezia)  A colonoscopy should certainly be considered to exclude  other causes of hematochezia, given that it has been 20 years since her last one.  Although the patient is advanced in age, she is fairly healthy, and I will would recommend she consider doing this.  The patient would like to avoid a colonoscopy at this time, but would like to reconsider it as an outpatient.  She would be amenable to a colonoscopy if it were thought to be absolutely necessary to stop her bleeding. As most diverticular bleeds do spontaneously resolve, it is unlikely that a colonoscopy will be needed for hemostasis.  It is not unreasonable to follow-up with her as an outpatient to reconsider a diagnostic colonoscopy to exclude other causes of lower GI bleeding.  Plan to monitor overnight for ongoing clinical bleeding.  Will reassess need for colonoscopy tomorrow Clear liquid diet today. Trend hemoglobin q12hr   Esterlene Atiyeh E. Stacia, MD [facility]  Moderate complex medical decision making (this includes chart review, review of results,  face-to-face time used for counseling as well as treatment plan and follow-up. The patient was provided an opportunity to ask questions and all were answered. The patient agreed with the plan and demonstrated an understanding of the instructions

## 2024-04-14 NOTE — ED Provider Notes (Signed)
 [place] EMERGENCY DEPARTMENT AT [facility] Provider Note   CSN: [id number] Arrival date & time: 04/14/24  9272     Patient presents with: Rectal Bleeding   Laura Carter is a 83 y.o. female.   Is an 83 year old female here today with bloody bowel movements.  Patient reports that she woke up this evening and felt the urge to defecate.  She says that she passed a large amount of bright red blood in her stool.  This repeated 2 more times and the patient called EMS.  Patient had a bowel movement with EMS which was noted to be bloody.  She is not on blood thinners but does take aspirin .  She reports that she has had colonoscopies in the past, but it has been a while.   Rectal Bleeding      Prior to Admission medications   Medication Sig Start Date End Date Taking? Authorizing Provider  acetaminophen  (TYLENOL ) 500 MG tablet Take 2 tablets (1,000 mg total) by mouth 2 (two) times daily as needed for mild pain (pain score 1-3) or moderate pain (pain score 4-6). 03/30/24   Deward Eck, PA-C  albuterol  (VENTOLIN  HFA) 108 (90 Base) MCG/ACT inhaler Inhale 2 puffs into the lungs every 6 (six) hours as needed. 08/07/23   Leath-Warren, Etta PARAS, NP  ALLERGY RELIEF 10 MG tablet Take 10 mg by mouth daily as needed for allergies. 12/26/23   [provider]  APPLE CIDER VINEGAR PO Take 15 mLs by mouth daily.    [provider]  ascorbic acid (VITAMIN C) 500 MG tablet Take 1,000 mg by mouth daily.    [provider]  ASHWAGANDHA PO Take 2 tablets by mouth daily.    [provider]  aspirin  EC 81 MG tablet Take 1 tablet (81 mg total) by mouth 2 (two) times daily. To prevent blood clots for 30 days after surgery. 03/30/24   Deward Eck, PA-C  azelastine  (ASTELIN ) 0.1 % nasal spray Place 1 spray into both nostrils 2 (two) times daily. Use in each nostril as directed Patient taking differently: Place 1 spray into both nostrils 2 (two) times daily as  needed for allergies or rhinitis. 01/12/24   Stuart Vernell Norris, PA-C  b complex vitamins capsule Take 2 capsules by mouth daily.    [provider]  esomeprazole (NEXIUM) 20 MG capsule Take 20 mg by mouth at bedtime.    [provider]  fluticasone -salmeterol (ADVAIR DISKUS) 100-50 MCG/ACT AEPB Inhale 1 puff into the lungs 2 (two) times daily as needed (shortness of breath).    [provider]  HYDROcodone -acetaminophen  (NORCO) 10-325 MG tablet Take 1 tablet by mouth every 4 (four) hours as needed for severe pain (pain score 7-10). in knee after surgery 03/30/24   Deward Eck, PA-C  meloxicam  (MOBIC ) 15 MG tablet Take 1 tablet (15 mg total) by mouth daily as needed for pain (and inflammation). 03/30/24   Deward Eck, PA-C  methocarbamol  (ROBAXIN ) 750 MG tablet Take 1 tablet (750 mg total) by mouth every 8 (eight) hours as needed for muscle spasms. 03/30/24   Deward Eck, PA-C  metoprolol  succinate (TOPROL -XL) 25 MG 24 hr tablet TAKE (1) TABLET BY MOUTH ONCE DAILY. 06/30/17   Duanne Butler DASEN, MD  NON FORMULARY Pt uses a cpap nightly    [provider]  ondansetron  (ZOFRAN -ODT) 4 MG disintegrating tablet Take 1 tablet (4 mg total) by mouth every 8 (eight) hours as needed for nausea or vomiting. 03/30/24  Deward Eck, PA-C    Allergies: Patient has no known allergies.    Review of Systems  Gastrointestinal:  Positive for hematochezia.    Updated Vital Signs BP 119/66 (BP Location: Right Arm)   Pulse 91   Temp (!) 97.4 F (36.3 C) (Oral)   Resp 18   Ht 5' 3 (1.6 m)   Wt 82.6 kg   SpO2 98%   BMI 32.26 kg/m   Physical Exam Vitals and nursing note reviewed. Exam conducted with a chaperone present.  Constitutional:      Appearance: She is not toxic-appearing.  HENT:     Head: Normocephalic and atraumatic.  Eyes:     Pupils: Pupils are equal, round, and reactive to light.  Cardiovascular:     Rate and Rhythm: Normal rate.  Pulmonary:     Effort:  Pulmonary effort is normal.  Abdominal:     General: There is no distension.     Palpations: Abdomen is soft.     Tenderness: There is no abdominal tenderness. There is no guarding.  Genitourinary:    Comments: DRE performed with nurse chaperone present.  There is partially coagulated blood present, no brisk bleeding at this time, although there is bright red blood on the patient's undergarments. Skin:    General: Skin is warm.  Neurological:     General: No focal deficit present.     Mental Status: She is alert.     (all labs ordered are listed, but only abnormal results are displayed) Labs Reviewed  COMPREHENSIVE METABOLIC PANEL WITH GFR - Abnormal; Notable for the following components:      Result Value   CO2 21 (*)    Glucose, Bld 149 (*)    Calcium  8.4 (*)    Total Protein 5.3 (*)    Albumin 3.2 (*)    GFR, Estimated 58 (*)    All other components within normal limits  CBC WITH DIFFERENTIAL/PLATELET - Abnormal; Notable for the following components:   RBC 2.90 (*)    Hemoglobin 8.5 (*)    HCT 27.4 (*)    All other components within normal limits  I-STAT CHEM 8, ED - Abnormal; Notable for the following components:   BUN 26 (*)    Glucose, Bld 145 (*)    TCO2 21 (*)    Hemoglobin 8.5 (*)    HCT 25.0 (*)    All other components within normal limits  TYPE AND SCREEN  PREPARE RBC (CROSSMATCH)  ABO/RH    EKG: EKG Interpretation Date/Time:  Wednesday April 14 2024 07:54:57 EST Ventricular Rate:  93 PR Interval:  140 QRS Duration:  101 QT Interval:  362 QTC Calculation: 451 R Axis:   44  Text Interpretation: Sinus rhythm Low voltage, precordial leads Abnormal R-wave progression, early transition Borderline ST depression, lateral leads Confirmed by Mannie Pac [phone]) on 04/14/2024 8:44:29 AM  Radiology: CT Angio Abd/Pel W and/or Wo Contrast Result Date: 04/14/2024 CLINICAL DATA:  Lower GI bleed. EXAM: CTA ABDOMEN AND PELVIS WITHOUT AND WITH CONTRAST  TECHNIQUE: Multidetector CT imaging of the abdomen and pelvis was performed using the standard protocol during bolus administration of intravenous contrast. Multiplanar reconstructed images and MIPs were obtained and reviewed to evaluate the vascular anatomy. RADIATION DOSE REDUCTION: This exam was performed according to the departmental dose-optimization program which includes automated exposure control, adjustment of the mA and/or kV according to patient size and/or use of iterative reconstruction technique. CONTRAST:  100mL OMNIPAQUE IOHEXOL 350 MG/ML SOLN COMPARISON:  None Available. FINDINGS: VASCULAR Aorta: Moderate atherosclerotic calcification of the abdominal aorta. No aneurysmal dilatation or dissection. No periaortic fluid collection. Celiac: The celiac trunk and its major branches are patent. SMA: The 7 is patent. Renals: The renal arteries are patent. 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 IMA: The IMA is patent. Inflow: Mild atherosclerotic calcification of the iliac arteries. No as well dilatation or dissection. The iliac arteries are patent. Proximal Outflow: The visualized proximal outflow is patent. Veins: The IVC is unremarkable. No portal venous gas. The SMV the, splenic vein, and main portal vein are patent. Review of the MIP images confirms the above findings. NON-VASCULAR Lower chest: No acute abnormality. No intra-abdominal free air or free fluid. Hepatobiliary: The liver is unremarkable. No acute bony hypertension. Cholecystectomy. No retained calcified stone noted in the central CBD. Pancreas: Unremarkable. No pancreatic ductal dilatation or surrounding inflammatory changes. Spleen: Normal in size without focal abnormality. Adrenals/Urinary Tract: The adrenal glands are unremarkable. There is no hydronephrosis or nephrolithiasis on either side. Small bilateral renal cysts. The visualized ureters and urinary bladder probable. Stomach/Bowel: There is a large hiatal hernia containing a  portion of the stomach. Severe sigmoid diverticulosis and scattered colonic diverticula. There is no bowel obstruction or active inflammation. The appendix is normal. Lymphatic: No adenopathy. Reproductive: The uterus is anteverted. No suspicious adnexal masses. Other: Fat containing bilateral inguinal hernias, left greater than right. No bowel herniation. Musculoskeletal: Degenerative changes of spine. No acute osseous pathology. IMPRESSION: 1. No acute intra-abdominal or pelvic pathology. No CT evidence of active GI bleed. 2. Severe sigmoid diverticulosis. No bowel obstruction. Normal appendix. 3. Large hiatal hernia containing a portion of the stomach. 4.  Aortic Atherosclerosis (ICD10-I70.0). Electronically Signed   By: Vanetta Chou M.D.   On: 04/14/2024 08:50     .Critical Care  Performed by: Mannie Fairy DASEN, DO Authorized by: Mannie Fairy DASEN, DO   Critical care provider statement:    Critical care time (minutes):  77   Critical care was necessary to treat or prevent imminent or life-threatening deterioration of the following conditions:  Shock   Critical care was time spent personally by me on the following activities:  Development of treatment plan with patient or surrogate, discussions with consultants, evaluation of patient's response to treatment, examination of patient, ordering and review of laboratory studies, ordering and review of radiographic studies, ordering and performing treatments and interventions, pulse oximetry, re-evaluation of patient's condition and review of old charts    Medications Ordered in the ED  0.9 %  sodium chloride  infusion (Manually program via Guardrails IV Fluids) (has no administration in time range)  iohexol (OMNIPAQUE) 350 MG/ML injection 100 mL (100 mLs Intravenous Contrast Given 04/14/24 0802)                                    Medical Decision Making 83 year old female here today with rectal bleeding.  Differential diagnoses include  diverticular bleeding, internal hemorrhoids, active GI bleeding.  Plan-patient was mildly hypotensive for EMS, received some fluids and is normotensive for his here presently.  Given the reported volume of blood, I do have concern for brisk lower GI bleed.  Will obtain CT angio on the patient.  Type and screen ordered.  Anticipate admission.  In the room, patient is alert, joking and very pleasant. Reassessment 9 AM-patient with Substantial hemoglobin drop.  In October, hemoglobin 13.8, hemoglobin today 8.5.  Will begin  to transfuse the patient.  Her CTA does not show any active lower GI bleeding, however shows large amount of diverticulosis, this is likely the source of her bleeding.  I have reached out to [facility] to make them aware of patient.  Will admit to hospitalist service.   Amount and/or Complexity of Data Reviewed Labs: ordered. Radiology: ordered.  Risk Prescription drug management.        Final diagnoses:  Lower GI bleed    ED Discharge Orders     None          Mannie Fairy DASEN, DO 04/14/24 9096

## 2024-04-14 NOTE — Progress Notes (Signed)
   Have been alerted of patient's admission to the hospital for BRBPR d/t lower GI bleed.   She was supposed to have her first PO visit in our office today with Dr. Beverley but obviously had to miss it. I will order a right knee xray to check implants and see patient tomorrow to remove surgical bandage and discuss next steps in recovery.   WBAT  ROM right knee encouraged Ok to work with PT   Gerard CHRISTELLA Large, PA-C Office [phone] 04/14/2024, 8:39 PM

## 2024-04-14 NOTE — ED Triage Notes (Signed)
 Pt bib EMS from home. Bloody stool x3. Large bowel movement witnessed by EMS with bright red 92/ given1 L fluid. 103/86 BP Nausea and dizziness present. Denies blood thinners and no LOC. HR 94 88% RA (96% 3 L)

## 2024-04-15 DIAGNOSIS — K922 Gastrointestinal hemorrhage, unspecified: Secondary | ICD-10-CM | POA: Diagnosis not present

## 2024-04-15 DIAGNOSIS — Z96651 Presence of right artificial knee joint: Secondary | ICD-10-CM | POA: Diagnosis not present

## 2024-04-15 DIAGNOSIS — D62 Acute posthemorrhagic anemia: Secondary | ICD-10-CM | POA: Insufficient documentation

## 2024-04-15 DIAGNOSIS — J4520 Mild intermittent asthma, uncomplicated: Secondary | ICD-10-CM | POA: Diagnosis present

## 2024-04-15 DIAGNOSIS — J45909 Unspecified asthma, uncomplicated: Secondary | ICD-10-CM | POA: Diagnosis not present

## 2024-04-15 DIAGNOSIS — E66811 Obesity, class 1: Secondary | ICD-10-CM

## 2024-04-15 DIAGNOSIS — I10 Essential (primary) hypertension: Secondary | ICD-10-CM | POA: Diagnosis present

## 2024-04-15 LAB — BASIC METABOLIC PANEL WITH GFR
Anion gap: 7 (ref 5–15)
BUN: 19 mg/dL (ref 8–23)
CO2: 24 mmol/L (ref 22–32)
Calcium: 8.9 mg/dL (ref 8.9–10.3)
Chloride: 110 mmol/L (ref 98–111)
Creatinine, Ser: 0.77 mg/dL (ref 0.44–1.00)
GFR, Estimated: >60 mL/min (ref >60)
Glucose, Bld: 114 mg/dL — ABNORMAL HIGH (ref 70–99)
Potassium: 4.4 mmol/L (ref 3.5–5.1)
Sodium: 141 mmol/L (ref 135–145)

## 2024-04-15 LAB — CBC
HCT: 32.6 % — ABNORMAL LOW (ref 36.0–46.0)
HCT: 29.8 % — ABNORMAL LOW (ref 36.0–46.0)
Hemoglobin: 10.3 g/dL — ABNORMAL LOW (ref 12.0–15.0)
Hemoglobin: 9.4 g/dL — ABNORMAL LOW (ref 12.0–15.0)
MCH: 27.7 pg (ref 26.0–34.0)
MCH: 27.9 pg (ref 26.0–34.0)
MCHC: 31.6 g/dL (ref 30.0–36.0)
MCHC: 31.5 g/dL (ref 30.0–36.0)
MCV: 87.6 fL (ref 80.0–100.0)
MCV: 88.4 fL (ref 80.0–100.0)
Platelets: 278 K/uL (ref 150–400)
Platelets: 276 K/uL (ref 150–400)
RBC: 3.72 MIL/uL — ABNORMAL LOW (ref 3.87–5.11)
RBC: 3.37 MIL/uL — ABNORMAL LOW (ref 3.87–5.11)
RDW: 19 % — ABNORMAL HIGH (ref 11.5–15.5)
RDW: 18.8 % — ABNORMAL HIGH (ref 11.5–15.5)
WBC: 4.8 K/uL (ref 4.0–10.5)
WBC: 5.6 K/uL (ref 4.0–10.5)
nRBC: 0 % (ref 0.0–0.2)
nRBC: 0 % (ref 0.0–0.2)

## 2024-04-15 LAB — HEMOGLOBIN AND HEMATOCRIT, BLOOD
HCT: 33.4 % — ABNORMAL LOW (ref 36.0–46.0)
Hemoglobin: 10.7 g/dL — ABNORMAL LOW (ref 12.0–15.0)

## 2024-04-15 NOTE — Progress Notes (Addendum)
 [facility] Progress Note  CC:  GI bleed   Subjective: She is tolerating a clear liquid diet.  No nausea or vomiting.  No upper or lower abdominal pain.  She passed 2 black balls of stool ( smaller than a golf ball)  with a small amount of pink fluid this morning.  No rectal pain.  No chest pain or shortness of breath.  She asked to be placed on oxygen  2 L nasal cannula as she was concerned she might develop a cough related to asthma.  She denied having any cough or asthma symptoms overnight or this morning.   Objective:  Vital signs in last 24 hours: Temp:  [97.5 F (36.4 C)-98.2 F (36.8 C)] 98 F (36.7 C) (11/20 0508) Pulse Rate:  [77-89] 77 (11/20 0508) Resp:  [14-25] 18 (11/20 0508) BP: (110-153)/(52-95) 136/74 (11/20 0508) SpO2:  [92 %-100 %] 93 % (11/20 0508) Last BM Date : 04/14/24 General: Alert 83 year old female in no acute distress. Heart: Regular rate and rhythm, no murmurs. Pulm: Breath sounds clear, on room air. Abdomen: Soft, nondistended.  Nontender.  Positive bowel sounds to all 4 quadrants.  RUQ incision with palpable hernia without tenderness. Extremities: No edema. Right knee incision closed and intact without erythema or exudate. Neurologic:  Alert and oriented x 4. Grossly normal neurologically. Psych:  Alert and cooperative. Normal mood and affect.  Intake/Output from previous day: 11/19 0701 - 11/20 0700 In: 753 [Blood:753] Out: 400 [Urine:400] Intake/Output this shift: No intake/output data recorded.  Lab Results: Recent Labs    04/14/24 0749 04/14/24 0820 04/14/24 2209 04/15/24 0629  WBC 4.8  --   --  4.8  HGB 8.5* 8.5* 10.5* 10.3*  HCT 27.4* 25.0* 32.7* 32.6*  PLT 314  --   --  278   BMET Recent Labs    04/14/24 0749 04/14/24 0820 04/15/24 0629  NA 142 141 141  K 4.4 4.4 4.4  CL 111 111 110  CO2 21*  --  24  GLUCOSE 149* 145* 114*  BUN 23 26* 19  CREATININE 0.96 1.00 0.77  CALCIUM  8.4*  --  8.9    LFT Recent Labs    04/14/24 0749  PROT 5.3*  ALBUMIN 3.2*  AST 19  ALT 17  ALKPHOS 78  BILITOT 0.4   PT/INR No results for input(s): LABPROT, INR in the last 72 hours. Hepatitis Panel No results for input(s): HEPBSAG, HCVAB, HEPAIGM, HEPBIGM in the last 72 hours.  DG Knee Right Port Result Date: 04/14/2024 CLINICAL DATA:  Status post total knee arthroplasty EXAM: PORTABLE RIGHT KNEE - 1-2 VIEW COMPARISON:  Right knee x-ray 03/30/2024 FINDINGS: There is soft tissue swelling surrounding the knee. Soft tissue air has resolved in the interval. Right knee total arthroplasty appears in anatomic alignment. No hardware loosening or acute fracture. IMPRESSION: Right knee total arthroplasty appears in anatomic alignment. Electronically Signed   By: Greig Pique M.D.   On: 04/14/2024 23:55   CT Angio Abd/Pel W and/or Wo Contrast Result Date: 04/14/2024 CLINICAL DATA:  Lower GI bleed. EXAM: CTA ABDOMEN AND PELVIS WITHOUT AND WITH CONTRAST TECHNIQUE: Multidetector CT imaging of the abdomen and pelvis was performed using the standard protocol during bolus administration of intravenous contrast. Multiplanar reconstructed images and MIPs were obtained and reviewed to evaluate the vascular anatomy. RADIATION DOSE REDUCTION: This exam was performed according to the departmental dose-optimization program which includes automated exposure control, adjustment of the mA and/or kV according to patient size and/or  use of iterative reconstruction technique. CONTRAST:  100mL OMNIPAQUE IOHEXOL 350 MG/ML SOLN COMPARISON:  None Available. FINDINGS: VASCULAR Aorta: Moderate atherosclerotic calcification of the abdominal aorta. No aneurysmal dilatation or dissection. No periaortic fluid collection. Celiac: The celiac trunk and its major branches are patent. SMA: The 7 is patent. Renals: The renal arteries are patent. 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 IMA: The IMA is patent. Inflow: Mild atherosclerotic  calcification of the iliac arteries. No as well dilatation or dissection. The iliac arteries are patent. Proximal Outflow: The visualized proximal outflow is patent. Veins: The IVC is unremarkable. No portal venous gas. The SMV the, splenic vein, and main portal vein are patent. Review of the MIP images confirms the above findings. NON-VASCULAR Lower chest: No acute abnormality. No intra-abdominal free air or free fluid. Hepatobiliary: The liver is unremarkable. No acute bony hypertension. Cholecystectomy. No retained calcified stone noted in the central CBD. Pancreas: Unremarkable. No pancreatic ductal dilatation or surrounding inflammatory changes. Spleen: Normal in size without focal abnormality. Adrenals/Urinary Tract: The adrenal glands are unremarkable. There is no hydronephrosis or nephrolithiasis on either side. Small bilateral renal cysts. The visualized ureters and urinary bladder probable. Stomach/Bowel: There is a large hiatal hernia containing a portion of the stomach. Severe sigmoid diverticulosis and scattered colonic diverticula. There is no bowel obstruction or active inflammation. The appendix is normal. Lymphatic: No adenopathy. Reproductive: The uterus is anteverted. No suspicious adnexal masses. Other: Fat containing bilateral inguinal hernias, left greater than right. No bowel herniation. Musculoskeletal: Degenerative changes of spine. No acute osseous pathology. IMPRESSION: 1. No acute intra-abdominal or pelvic pathology. No CT evidence of active GI bleed. 2. Severe sigmoid diverticulosis. No bowel obstruction. Normal appendix. 3. Large hiatal hernia containing a portion of the stomach. 4.  Aortic Atherosclerosis (ICD10-I70.0). Electronically Signed   By: Vanetta Chou M.D.   On: 04/14/2024 08:50   Patient Profile: Laura Carter is a 83 y.o. female with past medical history significant of recent right TKR surgery 03/30/2024 secondary to severe OA, HTN, chronic HFpEF, mild intermittent  asthma and left-sided diverticulitis cysts per colonoscopy/2000.  Admitted with acute onset of lower GI bleed.   Assessment / Plan:  83 year old female admitted with lower GI bleed. Admission Hg 8.5 down from 13.7 two weeks ago -> transfused two units of PRBCs -> Hg 10.5 -> today Hg 10.3 . BUN 26 -> 19. Iron 41. Ferritin 96. CTA negative for active GI bleed. Suspect diverticular bleed and recent right knee replacement surgery likely contributing to anemia.  Hemodynamically stable. - Patient does not wish to pursue a colonoscopy during this hospital admission, prefers to schedule after the holidays 05/2024 - Soft diet - Continue Pantoprazole  40mg  once daily  - Check H/H 2pm - Continue to monitor patient for active GI bleeding - Await further recommendations per Dr. Stacia  Asthma, stable. Patient requested to be place on oxygen  2 L [place] last night empirically to prevent asthma related cough  S/P right knee total replacement surgery 03/30/2024  Principal Problem:   Lower GI bleed Active Problems:   Diastolic dysfunction   S/P total knee arthroplasty, right   Essential hypertension   Mild intermittent asthma   Class 1 obesity due to excess calories with body mass index (BMI) of 32.0 to 32.9 in adult     LOS: 0 days   Laura Carter  04/15/2024, 9:46 AM  ----------------------------------------------------------------------------------------------------------------------  I have taken a history, reviewed the chart and examined the patient. I performed a substantive portion  of this encounter, including complete performance of at least one of the key components, in conjunction with the APP. I agree with the APP's note, impression and recommendations  Patient did well overnight with no overt bleeding, but late this morning experienced another frankly bloody bowel movement.  Patient states that it was darker red than the bloody bowel movements she had prior to admission. No  additional symptoms.  Hemodynamically stable.  Hemoglobin rechecked this afternoon and is stable from this morning. Not totally clear if this was old blood still being passed, but recommended continued observation overnight.  If patient does not pass any large bloody bowel movements and her hemoglobin remains stable, hopefully can discharge home tomorrow. If patient does have significant large-volume hematochezia overnight, would repeat CT-A.   Saryn Cherry E. Stacia, MD [facility]

## 2024-04-15 NOTE — Assessment & Plan Note (Addendum)
 Body mass index is 32.26 kg/m.SABRA  Weight loss should be encouraged Outpatient PCP/bariatric medicine f/u encouraged Significantly low or high BMI is associated with higher medical risk including morbidity and mortality

## 2024-04-15 NOTE — Progress Notes (Signed)
 I attest to student documentation.  Brek Reece V. Tashaun Obey, MSN-RN Nursing Faculty/Clinical Instructor [facility] Associate Degree Nursing Program

## 2024-04-15 NOTE — Assessment & Plan Note (Signed)
-   Appears compensated at this time ?

## 2024-04-15 NOTE — Progress Notes (Signed)
 Progress Note   Patient: Laura Carter FMW:[id number] DOB: [birth year] DOA: 04/14/2024     0 DOS: the patient was seen and examined on 04/15/2024   Brief hospital course: 83yo with h/o chronic HFpEF, HTN, mild intermittent asthma, and recent R TKR who presented on 11/19 with BRBPR.  Hgb 13 -> 8.5, transfused 1 unit PRBC.  GI consulted.    Assessment & Plan Lower GI bleed Hematochezia, clinically suspect diverticular bleeding CTA negative for active bleeding on 11/19 [facility] consulting Patient has had a very remote colonoscopy, willing to consider as an outpatient Diet advanced to soft Had another bleeding episode today so holding dc, changed back to CLD, and continuing to monitor If ongoing rapid bleeding, consider another CTA ABLA (acute blood loss anemia) Pre-op Hgb 13.7, down to 8.5 on presentation (but some of this may be related to peri-operative blood loss) S/p PRBC x 1 Hgb up to 10.3 this AM and appears to be stable now Recheck CBC at 5 pm and 5 am Diastolic dysfunction Appears compensated at this time Essential hypertension Holding metoprolol  for now Mild intermittent asthma No symptoms or signs of acute exacerbation Placed on [place] O2 overnight for comfort without documented hypoxia Continue Advair and as needed albuterol  Wean to RA S/P total knee arthroplasty, right Surgical wound looks clean no signs of infection Resume Home PT at time of dc Class 1 obesity due to excess calories with body mass index (BMI) of 32.0 to 32.9 in adult Body mass index is 32.26 kg/m.SABRA  Weight loss should be encouraged Outpatient PCP/bariatric medicine f/u encouraged Significantly low or high BMI is associated with higher medical risk including morbidity and mortality       Consultants: GI  Procedures: None  Antibiotics: None      Subjective: Feeling well.  Had an episode of melena this AM but no further bright red blood (until a little while ago).  Was hoping to go  home (now in agreement with staying).   Objective: Vitals:   04/15/24 0845 04/15/24 0905  BP: 134/76   Pulse: 72   Resp:    Temp: 97.7 F (36.5 C)   SpO2: 99% 97%    Intake/Output Summary (Last 24 hours) at 04/15/2024 1406 Last data filed at 04/14/2024 1814 Gross per 24 hour  Intake 753 ml  Output --  Net 753 ml   Filed Weights   04/14/24 0737  Weight: 82.6 kg    Exam:  General:  Appears calm and comfortable and is in NAD Eyes:  normal lids, iris ENT:  grossly normal hearing, lips & tongue, mmm Cardiovascular:  RRR. No LE edema.  Respiratory:   CTA bilaterally with no wheezes/rales/rhonchi.  Normal respiratory effort. Abdomen:  soft, NT, ND Skin:  no rash or induration seen on limited exam Musculoskeletal:  grossly normal tone BUE/BLE, good ROM, no bony abnormality Psychiatric:  grossly normal mood and affect, speech fluent and appropriate, AOx3 Neurologic:  CN 2-12 grossly intact, moves all extremities in coordinated fashion  Data Reviewed: I have reviewed the patient's lab results since admission.  Pertinent labs for today include:   BMP unremarkable WBC 4.8 Hgb 10.3     Family Communication: None present      Code Status: Full Code   Disposition: Status is: Observation The patient remains OBS appropriate and will d/c before 2 midnights.     Time spent: 50 minutes  Unresulted Labs (From admission, onward)     Start     Ordered  04/16/24 0500  CBC  Tomorrow morning,   R       Question:  Specimen collection method  Answer:  Lab=Lab collect   04/15/24 1402   04/15/24 1700  CBC  Once,   R       Question:  Specimen collection method  Answer:  Lab=Lab collect   04/15/24 1402             Author: Delon Herald, MD 04/15/2024 2:06 PM  For on call review [email or web address].

## 2024-04-15 NOTE — Assessment & Plan Note (Addendum)
 No symptoms or signs of acute exacerbation Placed on [place] O2 overnight for comfort without documented hypoxia Continue Advair and as needed albuterol  Wean to RA

## 2024-04-15 NOTE — Assessment & Plan Note (Signed)
 Pre-op Hgb 13.7, down to 8.5 on presentation (but some of this may be related to peri-operative blood loss) S/p PRBC x 1 Hgb up to 10.3 this AM and appears to be stable now Recheck CBC at 5 pm and 5 am

## 2024-04-15 NOTE — Progress Notes (Signed)
 Subjective: Patient is s/p R TKA on 03/30/24 by Dr. Beverley and would have had her 1st PO visit in the office yesterday but got admitted to the hospital for a GI bleed. She reports pain in the right knee as mild.  Has been working with PT and doing HEP. Pleased with her progress so far. Denies n/t in the foot.  Was on our usual postop regimen of ASA 81mg  bid but on hold now d/t bleed. Ideally would be helpful to be on something for 30 days postop if risk < rewards. Will defer to primary team and GI.      Objective:   VITALS:   Vitals:   04/15/24 0508 04/15/24 0845 04/15/24 0905 04/15/24 1607  BP: 136/74 134/76  126/60  Pulse: 77 72  80  Resp: 18   18  Temp: 98 F (36.7 C) 97.7 F (36.5 C)  98.2 F (36.8 C)  TempSrc:  Oral  Oral  SpO2: 93% 99% 97% 95%  Weight:      Height:          Latest Ref Rng & Units 04/15/2024    1:00 PM 04/15/2024    6:29 AM 04/14/2024   10:09 PM  CBC  WBC 4.0 - 10.5 K/uL  4.8    Hemoglobin 12.0 - 15.0 g/dL 89.2  89.6  89.4   Hematocrit 36.0 - 46.0 % 33.4  32.6  32.7   Platelets 150 - 400 K/uL  278        Latest Ref Rng & Units 04/15/2024    6:29 AM 04/14/2024    8:20 AM 04/14/2024    7:49 AM  BMP  Glucose 70 - 99 mg/dL 885  854  850   BUN 8 - 23 mg/dL 19  26  23    Creatinine 0.44 - 1.00 mg/dL 9.22  8.99  9.03   Sodium 135 - 145 mmol/L 141  141  142   Potassium 3.5 - 5.1 mmol/L 4.4  4.4  4.4   Chloride 98 - 111 mmol/L 110  111  111   CO2 22 - 32 mmol/L 24   21   Calcium  8.9 - 10.3 mg/dL 8.9   8.4    Intake/Output      11/19 0701 11/20 0700 11/20 0701 11/21 0700   Blood 753    Total Intake(mL/kg) 753 (9.1)    Urine (mL/kg/hr) 400    Total Output 400    Net +353         Urine Occurrence 2 x       Physical Exam: General: NAD.  Laying in bed, calm, comfortable Resp: No increased wob Cardio: regular rate and rhythm ABD soft Neurologically intact MSK Neurovascularly intact Sensation intact distally Intact pulses  distally Dorsiflexion/Plantar flexion intact Able to do SLR Incision: surgical dressing was removed by a non-orthopedic provider unfortunately but thankfully incision still covered with steri strips and appears C/D/I   Assessment:    2v right knee xrays show stable implants from recent TKA with no hardware complications   Principal Problem:   Lower GI bleed Active Problems:   Diastolic dysfunction   S/P total knee arthroplasty, right   Essential hypertension   Mild intermittent asthma   Class 1 obesity due to excess calories with body mass index (BMI) of 32.0 to 32.9 in adult   ABLA (acute blood loss anemia)   Plan: Notes indicate they hope to d/c her home tomorrow.    Elevate leg and Apply  ice PRN  Weightbearing: WBAT RLE, no restrictions, ROM and mobilization encouraged Insicional and dressing care: leave open to air Orthopedic device(s): CPM at home Showering: no submerging incisions VTE prophylaxis: was on ASA 81mg  bid but on hold now d/t bleed, ideally would be helpful to be on something if risk<rewards, will defer to primary team and GI. SCDs, ambulation Pain control: PRN Follow - up plan: she said she thinks she was rescheduled for office f/u on 04/28/24 Contact information:  Evalene Chancy MD, Gerard Large PA-C  Dispo: per primary team    Gerard CHRISTELLA Large, [place] Office [phone] 04/15/2024, 5:38 PM

## 2024-04-15 NOTE — Hospital Course (Addendum)
 83yo with h/o chronic HFpEF, HTN, mild intermittent asthma, and recent R TKR who presented on 11/19 with BRBPR.  Hgb 13 -> 8.5, transfused 1 unit PRBC and then another during the hospitalization.  GI consulted.  Colonoscopy 11/22 with several polyps including a 22mm in sigmoid colon and 18mm in distal rectum, awaiting pathology; also with may large-mouthed and small-mouthed diverticula throughout the colon without evidence of bleeding.

## 2024-04-15 NOTE — Assessment & Plan Note (Signed)
 Holding metoprolol  for now

## 2024-04-15 NOTE — Assessment & Plan Note (Addendum)
 Surgical wound looks clean no signs of infection Resume Home PT at time of dc

## 2024-04-15 NOTE — Plan of Care (Signed)
  Problem: Education: Goal: Knowledge of General Education information will improve Description: Including pain rating scale, medication(s)/side effects and non-pharmacologic comfort measures Outcome: Progressing   Problem: Clinical Measurements: Goal: Diagnostic test results will improve Outcome: Progressing   Problem: Activity: Goal: Risk for activity intolerance will decrease Outcome: Progressing   Problem: Nutrition: Goal: Adequate nutrition will be maintained Outcome: Progressing   Problem: Coping: Goal: Level of anxiety will decrease Outcome: Progressing   Problem: Elimination: Goal: Will not experience complications related to bowel motility Outcome: Progressing Goal: Will not experience complications related to urinary retention Outcome: Progressing   Problem: Pain Managment: Goal: General experience of comfort will improve and/or be controlled Outcome: Progressing   Problem: Safety: Goal: Ability to remain free from injury will improve Outcome: Progressing   Problem: Skin Integrity: Goal: Risk for impaired skin integrity will decrease Outcome: Progressing

## 2024-04-15 NOTE — TOC Initial Note (Signed)
 Transition of Care [facility]) - Initial/Assessment Note    Patient Details  Name: Laura Carter MRN: [id number] Date of Birth: 1941/05/15  [facility]) CM/SW Contact:    Sonda Manuella Quill, RN Phone Number: 04/15/2024, 10:49 AM  Clinical Narrative:                 Spoke w/ pt in room; pt said she lives at home w/ her spouse Laura Carter [phone]); she plans to return at d/c w/ his support; he will provide transportation; pt verified insurance/PCP; she denied SDOH risks; she has cane and walker; pt said she will have OPPT at [facility] PT; she does not have HH services or home oxygen ; IP CM is following.  Expected Discharge Plan: Home/Self Care Barriers to Discharge: Continued Medical Work up   Patient Goals and CMS Choice Patient states their goals for this hospitalization and ongoing recovery are:: home     [place] ownership interest in [facility].provided to:: Patient    Expected Discharge Plan and Services   Discharge Planning Services: CM Consult   Living arrangements for the past 2 months: Single Family Home                 DME Arranged: N/A DME Agency: NA       HH Arranged: NA HH Agency: NA        Prior Living Arrangements/Services Living arrangements for the past 2 months: Single Family Home Lives with:: Spouse Patient language and need for interpreter reviewed:: Yes Do you feel safe going back to the place where you live?: Yes      Need for Family Participation in Patient Care: Yes (Comment) Care giver support system in place?: Yes (comment) Current home services: DME (cane, walker) Criminal Activity/Legal Involvement Pertinent to Current Situation/Hospitalization: No - Comment as needed  Activities of Daily Living   ADL Screening (condition at time of admission) Independently performs ADLs?: Yes (appropriate for developmental age) Is the patient deaf or have difficulty hearing?: Yes Does the patient have difficulty  seeing, even when wearing glasses/contacts?: No Does the patient have difficulty concentrating, remembering, or making decisions?: No  Permission Sought/Granted Permission sought to share information with : Case Manager Permission granted to share information with : Yes, Verbal Permission Granted  Share Information with NAME: Case Manager     Permission granted to share info w Relationship: Nahdia Doucet (spouse) [phone]     Emotional Assessment Appearance:: Appears stated age Attitude/Demeanor/Rapport: Gracious Affect (typically observed): Accepting Orientation: : Oriented to Self, Oriented to Place, Oriented to  Time, Oriented to Situation Alcohol / Substance Use: Not Applicable Psych Involvement: No (comment)  Admission diagnosis:  Lower GI bleed [K92.2] Patient Active Problem List   Diagnosis Date Noted   Essential hypertension 04/15/2024   Mild intermittent asthma 04/15/2024   Class 1 obesity due to excess calories with body mass index (BMI) of 32.0 to 32.9 in adult 04/15/2024   Lower GI bleed 04/14/2024   S/P total knee arthroplasty, right 03/30/2024   Myofascial pain 02/27/2022   Leg length discrepancy 07/12/2020   Spinal stenosis, lumbar region, with neurogenic claudication 04/03/2020   Nerve pain 04/03/2020   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 [place], [place] - [address] [place] [address] Phone: [phone] Fax: [phone]  [id number] - [place], [place] - [address] [facility] [address] [place] [address] Phone: [phone] Fax:  [phone]     Social Drivers of Health (SDOH) Social History: SDOH Screenings   Food Insecurity: No Food Insecurity (04/15/2024)  Housing: Low Risk  (04/15/2024)  Transportation Needs: No Transportation Needs (04/15/2024)  Utilities: Not At Risk (04/15/2024)  Depression (PHQ2-9): Low Risk  (11/05/2021)  Social Connections: Socially Integrated (04/15/2024)  Tobacco Use: Medium Risk (04/14/2024)   SDOH Interventions: Food Insecurity Interventions: Intervention Not Indicated, Inpatient TOC Housing Interventions: Intervention Not Indicated, Inpatient TOC Transportation Interventions: Intervention Not Indicated, Inpatient TOC Utilities Interventions: Intervention Not Indicated, Inpatient TOC   Readmission Risk Interventions     No data to display

## 2024-04-15 NOTE — Progress Notes (Signed)
   04/15/24 2028  BiPAP/CPAP/SIPAP  BiPAP/CPAP/SIPAP Pt Type Adult  Reason BIPAP/CPAP not in use Non-compliant (Pt refusing cpap for the night, states she would rather wear O2.)

## 2024-04-15 NOTE — Care Management Obs Status (Signed)
 MEDICARE OBSERVATION STATUS NOTIFICATION   Patient Details  Name: Laura Carter MRN: [id number] Date of Birth: 1941/03/23   Medicare Observation Status Notification Given:  Yes    Sonda Manuella Quill, RN 04/15/2024, 10:46 AM

## 2024-04-15 NOTE — Assessment & Plan Note (Addendum)
 Hematochezia, clinically suspect diverticular bleeding CTA negative for active bleeding on 11/19 [facility] consulting Patient has had a very remote colonoscopy, willing to consider as an outpatient Diet advanced to soft Had another bleeding episode today so holding dc, changed back to CLD, and continuing to monitor If ongoing rapid bleeding, consider another CTA

## 2024-04-16 DIAGNOSIS — D128 Benign neoplasm of rectum: Secondary | ICD-10-CM | POA: Diagnosis present

## 2024-04-16 DIAGNOSIS — I959 Hypotension, unspecified: Secondary | ICD-10-CM | POA: Diagnosis present

## 2024-04-16 DIAGNOSIS — I110 Hypertensive heart disease with heart failure: Secondary | ICD-10-CM | POA: Diagnosis present

## 2024-04-16 DIAGNOSIS — Z96653 Presence of artificial knee joint, bilateral: Secondary | ICD-10-CM | POA: Diagnosis present

## 2024-04-16 DIAGNOSIS — I5032 Chronic diastolic (congestive) heart failure: Secondary | ICD-10-CM | POA: Diagnosis present

## 2024-04-16 DIAGNOSIS — Z6832 Body mass index (BMI) 32.0-32.9, adult: Secondary | ICD-10-CM | POA: Diagnosis not present

## 2024-04-16 DIAGNOSIS — Z800 Family history of malignant neoplasm of digestive organs: Secondary | ICD-10-CM | POA: Diagnosis not present

## 2024-04-16 DIAGNOSIS — D62 Acute posthemorrhagic anemia: Secondary | ICD-10-CM | POA: Diagnosis present

## 2024-04-16 DIAGNOSIS — D122 Benign neoplasm of ascending colon: Secondary | ICD-10-CM | POA: Diagnosis present

## 2024-04-16 DIAGNOSIS — D125 Benign neoplasm of sigmoid colon: Secondary | ICD-10-CM | POA: Diagnosis present

## 2024-04-16 DIAGNOSIS — G4730 Sleep apnea, unspecified: Secondary | ICD-10-CM | POA: Diagnosis present

## 2024-04-16 DIAGNOSIS — D120 Benign neoplasm of cecum: Secondary | ICD-10-CM | POA: Diagnosis present

## 2024-04-16 DIAGNOSIS — K621 Rectal polyp: Secondary | ICD-10-CM | POA: Diagnosis not present

## 2024-04-16 DIAGNOSIS — Z85828 Personal history of other malignant neoplasm of skin: Secondary | ICD-10-CM | POA: Diagnosis not present

## 2024-04-16 DIAGNOSIS — Z87891 Personal history of nicotine dependence: Secondary | ICD-10-CM | POA: Diagnosis not present

## 2024-04-16 DIAGNOSIS — J4520 Mild intermittent asthma, uncomplicated: Secondary | ICD-10-CM | POA: Diagnosis present

## 2024-04-16 DIAGNOSIS — C187 Malignant neoplasm of sigmoid colon: Secondary | ICD-10-CM | POA: Diagnosis present

## 2024-04-16 DIAGNOSIS — K5731 Diverticulosis of large intestine without perforation or abscess with bleeding: Secondary | ICD-10-CM | POA: Diagnosis present

## 2024-04-16 DIAGNOSIS — D123 Benign neoplasm of transverse colon: Secondary | ICD-10-CM | POA: Diagnosis present

## 2024-04-16 DIAGNOSIS — Z79899 Other long term (current) drug therapy: Secondary | ICD-10-CM | POA: Diagnosis not present

## 2024-04-16 DIAGNOSIS — Z8249 Family history of ischemic heart disease and other diseases of the circulatory system: Secondary | ICD-10-CM | POA: Diagnosis not present

## 2024-04-16 DIAGNOSIS — K922 Gastrointestinal hemorrhage, unspecified: Secondary | ICD-10-CM | POA: Diagnosis present

## 2024-04-16 DIAGNOSIS — Z7982 Long term (current) use of aspirin: Secondary | ICD-10-CM | POA: Diagnosis not present

## 2024-04-16 DIAGNOSIS — E66811 Obesity, class 1: Secondary | ICD-10-CM | POA: Diagnosis present

## 2024-04-16 DIAGNOSIS — K6389 Other specified diseases of intestine: Secondary | ICD-10-CM | POA: Diagnosis present

## 2024-04-16 DIAGNOSIS — Z7951 Long term (current) use of inhaled steroids: Secondary | ICD-10-CM | POA: Diagnosis not present

## 2024-04-16 LAB — CBC
HCT: 26.5 % — ABNORMAL LOW (ref 36.0–46.0)
HCT: 36 % (ref 36.0–46.0)
Hemoglobin: 8.5 g/dL — ABNORMAL LOW (ref 12.0–15.0)
Hemoglobin: 11.5 g/dL — ABNORMAL LOW (ref 12.0–15.0)
MCH: 28.4 pg (ref 26.0–34.0)
MCH: 28.4 pg (ref 26.0–34.0)
MCHC: 32.1 g/dL (ref 30.0–36.0)
MCHC: 31.9 g/dL (ref 30.0–36.0)
MCV: 88.6 fL (ref 80.0–100.0)
MCV: 88.9 fL (ref 80.0–100.0)
Platelets: 264 K/uL (ref 150–400)
Platelets: 334 K/uL (ref 150–400)
RBC: 2.99 MIL/uL — ABNORMAL LOW (ref 3.87–5.11)
RBC: 4.05 MIL/uL (ref 3.87–5.11)
RDW: 18.6 % — ABNORMAL HIGH (ref 11.5–15.5)
RDW: 18 % — ABNORMAL HIGH (ref 11.5–15.5)
WBC: 6 K/uL (ref 4.0–10.5)
WBC: 7.3 K/uL (ref 4.0–10.5)
nRBC: 0 % (ref 0.0–0.2)
nRBC: 0 % (ref 0.0–0.2)

## 2024-04-16 LAB — PREPARE RBC (CROSSMATCH)

## 2024-04-16 MED ORDER — NA SULFATE-K SULFATE-MG SULF 17.5-3.13-1.6 GM/177ML PO SOLN
0.5000 | Freq: Once | ORAL | Status: AC
  Administered 2024-04-16: 177 mL via ORAL
  Filled 2024-04-16: qty 1

## 2024-04-16 MED ORDER — SODIUM CHLORIDE 0.9 % IV SOLN
INTRAVENOUS | Status: AC
  Administered 2024-04-16: 10 mL/h via INTRAVENOUS

## 2024-04-16 MED ORDER — SODIUM CHLORIDE 0.9% IV SOLUTION: Freq: Once | INTRAVENOUS | Status: AC

## 2024-04-16 MED ORDER — NA SULFATE-K SULFATE-MG SULF 17.5-3.13-1.6 GM/177ML PO SOLN: 0.5000 | Freq: Once | ORAL | Status: DC

## 2024-04-16 NOTE — Assessment & Plan Note (Signed)
 No symptoms or signs of acute exacerbation Placed on [place] O2 overnight for comfort without documented hypoxia Continue Advair and as needed albuterol  Wean to RA

## 2024-04-16 NOTE — Assessment & Plan Note (Addendum)
 Pre-op Hgb 13.7, down to 8.5 on presentation (but some of this may be related to peri-operative blood loss) S/p PRBC x 1 Hgb up to 10.3 but back down to 8.4 with symptoms Will transfuse another 1 unit PRBC

## 2024-04-16 NOTE — Assessment & Plan Note (Signed)
-   Appears compensated at this time ?

## 2024-04-16 NOTE — Assessment & Plan Note (Addendum)
 Body mass index is 32.26 kg/m.Laura Carter  Weight loss should be encouraged Outpatient PCP/bariatric medicine f/u encouraged Significantly low or high BMI is associated with higher medical risk including morbidity and mortality

## 2024-04-16 NOTE — Plan of Care (Signed)
  Problem: Activity: Goal: Risk for activity intolerance will decrease Outcome: Progressing   Problem: Coping: Goal: Level of anxiety will decrease Outcome: Progressing   Problem: Pain Managment: Goal: General experience of comfort will improve and/or be controlled Outcome: Progressing   Problem: Safety: Goal: Ability to remain free from injury will improve Outcome: Progressing

## 2024-04-16 NOTE — Progress Notes (Addendum)
 [facility] Progress Note  CC:   GI bleed   Subjective: She passed a moderate amount of dark red hematochezia at 3 am, a smaller amount of dark red blood with stool at 7 AM and a scant amount of dark red blood per the rectum at 11 AM. She endorsed feeling dizzy when up to the commode. No CP or SOB. Dr. Barbarann at the bedside.    Objective:  Vital signs in last 24 hours: Temp:  [97.7 F (36.5 C)-98.6 F (37 C)] 98.4 F (36.9 C) (11/21 1103) Pulse Rate:  [80-85] 85 (11/21 1103) Resp:  [16-18] 16 (11/21 0530) BP: (107-126)/(60-73) 111/73 (11/21 1103) SpO2:  [94 %-96 %] 96 % (11/21 1103) FiO2 (%):  [21 %] 21 % (11/21 0941) Last BM Date : 04/15/24 General: 83 year old female in NAD.  Heart: RRR, no murmurs.  Pulm: Breath sounds clear throughout. On oxygen  2L [place].  Abdomen: Soft. Nondistended. Positive bowel sounds to all 4 quadrants. RUQ incision with palpable hernia without tenderness.  Extremities: No lower extremity edema. Neurologic:  Alert and oriented x 4. Grossly normal neurologically. Psych:  Alert and cooperative. Normal mood and affect.  Intake/Output from previous day: No intake/output data recorded. Intake/Output this shift: Total I/O In: 280 [P.O.:280] Out: -   Lab Results: Recent Labs    04/15/24 0629 04/15/24 1300 04/15/24 1704 04/16/24 0553  WBC 4.8  --  5.6 6.0  HGB 10.3* 10.7* 9.4* 8.5*  HCT 32.6* 33.4* 29.8* 26.5*  PLT 278  --  276 264   BMET Recent Labs    04/14/24 0749 04/14/24 0820 04/15/24 0629  NA 142 141 141  K 4.4 4.4 4.4  CL 111 111 110  CO2 21*  --  24  GLUCOSE 149* 145* 114*  BUN 23 26* 19  CREATININE 0.96 1.00 0.77  CALCIUM  8.4*  --  8.9   LFT Recent Labs    04/14/24 0749  PROT 5.3*  ALBUMIN 3.2*  AST 19  ALT 17  ALKPHOS 78  BILITOT 0.4   PT/INR No results for input(s): LABPROT, INR in the last 72 hours. Hepatitis Panel No results for input(s): HEPBSAG, HCVAB, HEPAIGM, HEPBIGM in the last  72 hours.  DG Knee Right Port Result Date: 04/14/2024 CLINICAL DATA:  Status post total knee arthroplasty EXAM: PORTABLE RIGHT KNEE - 1-2 VIEW COMPARISON:  Right knee x-ray 03/30/2024 FINDINGS: There is soft tissue swelling surrounding the knee. Soft tissue air has resolved in the interval. Right knee total arthroplasty appears in anatomic alignment. No hardware loosening or acute fracture. IMPRESSION: Right knee total arthroplasty appears in anatomic alignment. Electronically Signed   By: Greig Pique M.D.   On: 04/14/2024 23:55   Patient Profile: Jared Cahn is a 83 y.o. female with past medical history significant of recent right TKR surgery 03/30/2024 secondary to severe OA, HTN, chronic HFpEF, mild intermittent asthma and left-sided diverticulosis per colonoscopy 02/1999. Admitted 04/14/2024 with lower GI bleed.   Assessment / Plan:  82 year old female admitted 11/19 with lower GI bleed. Admission Hg 8.5 down from 13.7 two weeks ago -> transfused two units of PRBCs -> Hg 10.5 -> Hg 10.3 -> 10.7 -> 9.4 -> today Hg 8.5. Patient passed dark red hematochezia x 3 since 3 AM, last episode was smaller volume around 11 AM. Admission CTA 11/19 negative for active GI bleed. Patient symptomatic, dizzy and light headed when up to the commode. No CP or SOB. One unit of PRBCs  ordered per Dr. Barbarann. Suspect diverticular bleed and recent right knee replacement surgery likely contributing to anemia.  - One unit of PRBCs ordered by Dr. Barbarann - Check H/H post transfusion  - CBC and BMP in am - Clear liquid diet - NPO after midnight  - Colonoscopy Sat 04/17/2024 benefits and risks discussed including risk with sedation, risk of bleeding, perforation and infection  - CTA if patient demonstrates brisk GI bleeding  - Continue Pantoprazole  40mg  once daily - Await further recommendations per Dr. Stacia   Asthma, stable. Patient requested to be place on oxygen  2 L [place] last night empirically to prevent  asthma related cough   S/P right knee total replacement surgery 03/30/2024  Principal Problem:   Lower GI bleed Active Problems:   Diastolic dysfunction   S/P total knee arthroplasty, right   Essential hypertension   Mild intermittent asthma   Class 1 obesity due to excess calories with body mass index (BMI) of 32.0 to 32.9 in adult   ABLA (acute blood loss anemia)     LOS: 0 days   Elida CHRISTELLA Shawl  04/16/2024, 11:47 AM  --------------------------------------------------------------------------------------------------------------------------------  I have taken a history, reviewed the chart and examined the patient. I performed a substantive portion of this encounter, including complete performance of at least one of the key components, in conjunction with the APP. I agree with the APP's note, impression and recommendations.  Patient has had recurrent hematochezia today with associated drop in hemoglobin.  Blood pressure has been on the low side (systolics 100s).  Patient is frustrated by the recurrent bleeding.  She is agreeable to proceeding with a colonoscopy tomorrow to try and identify bleeding source and provide hemostasis.  Will plan for bowel prep this evening and colonoscopy tomorrow morning.  Should the patient continues to experience frequent large-volume hematochezia and any hemodynamic instability, would repeat a CTA today. We did discuss the inherent difficulties to identifying and treating culprit diverticula during a colonoscopy, but I believe that this is the appropriate next step given her ongoing bleeding and previously negative CTA. Okay to continue clear liquid diet from GI standpoint. Colonoscopy scheduled for 9 AM tomorrow morning.  The details, risks (including bleeding, perforation, infection, missed lesions, medication reactions and possible hospitalization or surgery if complications occur), benefits, and alternatives to colonoscopy with possible biopsy  and possible polypectomy were discussed with the patient and she consents to proceed.    Yisell Sprunger E. Stacia, MD [facility]  I spent a total of 35 minutes reviewing the patient's medical record, interviewing and examining the patient, discussing her diagnosis and management of her condition going forward, and documenting in the medical record

## 2024-04-16 NOTE — Assessment & Plan Note (Addendum)
 Hematochezia, clinically suspect diverticular bleeding CTA negative for active bleeding on 11/19 [facility] consulting Patient has had a very remote colonoscopy Diet advanced to soft Had recurrent bleeding episodes, changed back to CLD, and continuing to monitor Given ongoing bleeding, GI is planning to do a colonoscopy on 11/22

## 2024-04-16 NOTE — Progress Notes (Signed)
   04/16/24 2049  BiPAP/CPAP/SIPAP  BiPAP/CPAP/SIPAP Pt Type Adult  Reason BIPAP/CPAP not in use Non-compliant

## 2024-04-16 NOTE — Assessment & Plan Note (Signed)
 Surgical wound looks clean no signs of infection Resume Home PT at time of dc

## 2024-04-16 NOTE — Assessment & Plan Note (Signed)
 Holding metoprolol  for now

## 2024-04-16 NOTE — Plan of Care (Signed)
  Problem: Education: Goal: Knowledge of General Education information will improve Description: Including pain rating scale, medication(s)/side effects and non-pharmacologic comfort measures Outcome: Progressing   Problem: Clinical Measurements: Goal: Will remain free from infection Outcome: Progressing Goal: Respiratory complications will improve Outcome: Progressing Goal: Cardiovascular complication will be avoided Outcome: Progressing   Problem: Nutrition: Goal: Adequate nutrition will be maintained Outcome: Progressing   Problem: Elimination: Goal: Will not experience complications related to bowel motility Outcome: Progressing Goal: Will not experience complications related to urinary retention Outcome: Progressing   Problem: Pain Managment: Goal: General experience of comfort will improve and/or be controlled Outcome: Progressing   Problem: Safety: Goal: Ability to remain free from injury will improve Outcome: Progressing   Problem: Skin Integrity: Goal: Risk for impaired skin integrity will decrease Outcome: Progressing

## 2024-04-16 NOTE — Progress Notes (Signed)
 Patient has had two very bloody bowel movements overnight; one moderate semi formed black/dark red stool and one liquid very dark red stool. No abdominal pain reported.

## 2024-04-16 NOTE — Progress Notes (Signed)
 Progress Note   Patient: Laura Carter FMW:[id number] DOB: 01/17/1941 DOA: 04/14/2024     0 DOS: the patient was seen and examined on 04/16/2024   Brief hospital course: 83yo with h/o chronic HFpEF, HTN, mild intermittent asthma, and recent R TKR who presented on 11/19 with BRBPR.  Hgb 13 -> 8.5, transfused 1 unit PRBC.  GI consulted.    Assessment & Plan Lower GI bleed Hematochezia, clinically suspect diverticular bleeding CTA negative for active bleeding on 11/19 [facility] consulting Patient has had a very remote colonoscopy Diet advanced to soft Had recurrent bleeding episodes, changed back to CLD, and continuing to monitor Given ongoing bleeding, GI is planning to do a colonoscopy on 11/22 ABLA (acute blood loss anemia) Pre-op Hgb 13.7, down to 8.5 on presentation (but some of this may be related to peri-operative blood loss) S/p PRBC x 1 Hgb up to 10.3 but back down to 8.4 with symptoms Will transfuse another 1 unit PRBC Diastolic dysfunction Appears compensated at this time Essential hypertension Holding metoprolol  for now Mild intermittent asthma No symptoms or signs of acute exacerbation Placed on [place] O2 overnight for comfort without documented hypoxia Continue Advair and as needed albuterol  Wean to RA S/P total knee arthroplasty, right Surgical wound looks clean no signs of infection Resume Home PT at time of dc Class 1 obesity due to excess calories with body mass index (BMI) of 32.0 to 32.9 in adult Body mass index is 32.26 kg/m.SABRA  Weight loss should be encouraged Outpatient PCP/bariatric medicine f/u encouraged Significantly low or high BMI is associated with higher medical risk including morbidity and mortality       Consultants: GI   Procedures: None   Antibiotics: None     Subjective: Feels fatigued, woozy when OOB to bedside commode.  Continuing to have bleeding episodes.   Objective: Vitals:   04/16/24 1612 04/16/24 1627  BP:  138/65 (!) 119/48  Pulse: 77 80  Resp: 18 18  Temp: 98.2 F (36.8 C) 98.6 F (37 C)  SpO2:  95%    Intake/Output Summary (Last 24 hours) at 04/16/2024 1739 Last data filed at 04/16/2024 1500 Gross per 24 hour  Intake 899.77 ml  Output --  Net 899.77 ml   Filed Weights   04/14/24 0737  Weight: 82.6 kg    Exam:  General:  Appears calm and comfortable and is in NAD Eyes:  normal lids, iris ENT:  grossly normal hearing, lips & tongue, mmm Cardiovascular:  RRR. No LE edema.  Respiratory:   CTA bilaterally with no wheezes/rales/rhonchi.  Normal respiratory effort. Abdomen:  soft, NT, ND Skin:  no rash or induration seen on limited exam Musculoskeletal:  grossly normal tone BUE/BLE, good ROM, no bony abnormality Psychiatric:  grossly normal mood and affect, speech fluent and appropriate, AOx3 Neurologic:  CN 2-12 grossly intact, moves all extremities in coordinated fashion  Data Reviewed: I have reviewed the patient's lab results since admission.  Pertinent labs for today include:   WBC 6 Hgb 8.5, down from 10.3 on 11/20     Family Communication: None present; she declined to have me call her husband today       Code Status: Full Code    Disposition: Status is: Inpatient Admit - It is my clinical opinion that admission to INPATIENT is reasonable and necessary because of the expectation that this patient will require hospital care that crosses at least 2 midnights to treat this condition based on the medical complexity of the  problems presented.  Given the aforementioned information, the predictability of an adverse outcome is felt to be significant.      Time spent: 50 minutes  Unresulted Labs (From admission, onward)     Start     Ordered   04/17/24 0500  CBC  Tomorrow morning,   R       Question:  Specimen collection method  Answer:  Lab=Lab collect   04/16/24 1150   04/17/24 0500  Basic metabolic panel  Tomorrow morning,   R       Question:  Specimen  collection method  Answer:  Lab=Lab collect   04/16/24 1150   04/16/24 1740  CBC  Once,   R       Question:  Specimen collection method  Answer:  Lab=Lab collect   04/16/24 1739             Author: Delon Herald, MD 04/16/2024 5:39 PM  For on call review [email or web address].

## 2024-04-16 NOTE — H&P (View-Only) (Signed)
 Laura Carter  CC:   GI bleed   Subjective: She passed a moderate amount of dark red hematochezia at 3 am, a smaller amount of dark red blood with stool at 7 AM and a scant amount of dark red blood per the rectum at 11 AM. She endorsed feeling dizzy when up to the commode. No CP or SOB. Dr. Barbarann at the bedside.    Objective:  Vital signs in last 24 hours: Temp:  [97.7 F (36.5 C)-98.6 F (37 C)] 98.4 F (36.9 C) (11/21 1103) Pulse Rate:  [80-85] 85 (11/21 1103) Resp:  [16-18] 16 (11/21 0530) BP: (107-126)/(60-73) 111/73 (11/21 1103) SpO2:  [94 %-96 %] 96 % (11/21 1103) FiO2 (%):  [21 %] 21 % (11/21 0941) Last BM Date : 04/15/24 General: 83 year old Carter in NAD.  Heart: RRR, no murmurs.  Pulm: Breath sounds clear throughout. On oxygen  2L [place].  Abdomen: Soft. Nondistended. Positive bowel sounds to all 4 quadrants. RUQ incision with palpable hernia without tenderness.  Extremities: No lower extremity edema. Neurologic:  Alert and oriented x 4. Grossly normal neurologically. Psych:  Alert and cooperative. Normal mood and affect.  Intake/Output from previous day: No intake/output data recorded. Intake/Output this shift: Total I/O In: 280 [P.O.:280] Out: -   Lab Results: Recent Labs    04/15/24 0629 04/15/24 1300 04/15/24 1704 04/16/24 0553  WBC 4.8  --  5.6 6.0  HGB 10.3* 10.7* 9.4* 8.5*  HCT 32.6* 33.4* 29.8* 26.5*  PLT 278  --  276 264   BMET Recent Labs    04/14/24 0749 04/14/24 0820 04/15/24 0629  NA 142 141 141  K 4.4 4.4 4.4  CL 111 111 110  CO2 21*  --  24  GLUCOSE 149* 145* 114*  BUN 23 26* 19  CREATININE 0.96 1.00 0.77  CALCIUM  8.4*  --  8.9   LFT Recent Labs    04/14/24 0749  PROT 5.3*  ALBUMIN 3.2*  AST 19  ALT 17  ALKPHOS 78  BILITOT 0.4   PT/INR No results for input(s): LABPROT, INR in the last 72 hours. Hepatitis Panel No results for input(s): HEPBSAG, HCVAB, HEPAIGM, HEPBIGM in the last  72 hours.  DG Knee Right Port Result Date: 04/14/2024 CLINICAL DATA:  Status post total knee arthroplasty EXAM: PORTABLE RIGHT KNEE - 1-2 VIEW COMPARISON:  Right knee x-ray 03/30/2024 FINDINGS: There is soft tissue swelling surrounding the knee. Soft tissue air has resolved in the interval. Right knee total arthroplasty appears in anatomic alignment. No hardware loosening or acute fracture. IMPRESSION: Right knee total arthroplasty appears in anatomic alignment. Electronically Signed   By: Greig Pique M.D.   On: 04/14/2024 23:55   Patient Profile: Laura Carter with past medical history significant of recent right TKR surgery 03/30/2024 secondary to severe OA, HTN, chronic HFpEF, mild intermittent asthma and left-sided diverticulosis per colonoscopy 02/1999. Admitted 04/14/2024 with lower GI bleed.   Assessment / Plan:  82 year old Carter admitted 11/19 with lower GI bleed. Admission Hg 8.5 down from 13.7 two weeks ago -> transfused two units of PRBCs -> Hg 10.5 -> Hg 10.3 -> 10.7 -> 9.4 -> today Hg 8.5. Patient passed dark red hematochezia x 3 since 3 AM, last episode was smaller volume around 11 AM. Admission CTA 11/19 negative for active GI bleed. Patient symptomatic, dizzy and light headed when up to the commode. No CP or SOB. One unit of PRBCs  ordered per Dr. Barbarann. Suspect diverticular bleed and recent right knee replacement surgery likely contributing to anemia.  - One unit of PRBCs ordered by Dr. Barbarann - Check H/H post transfusion  - CBC and BMP in am - Clear liquid diet - NPO after midnight  - Colonoscopy Sat 04/17/2024 benefits and risks discussed including risk with sedation, risk of bleeding, perforation and infection  - CTA if patient demonstrates brisk GI bleeding  - Continue Pantoprazole  40mg  once daily - Await further recommendations per Dr. Stacia   Asthma, stable. Patient requested to be place on oxygen  2 L [place] last night empirically to prevent  asthma related cough   S/P right knee total replacement surgery 03/30/2024  Principal Problem:   Lower GI bleed Active Problems:   Diastolic dysfunction   S/P total knee arthroplasty, right   Essential hypertension   Mild intermittent asthma   Class 1 obesity due to excess calories with body mass index (BMI) of 32.0 to 32.9 in adult   ABLA (acute blood loss anemia)     LOS: 0 days   Laura Carter  Carter, 11:47 AM  --------------------------------------------------------------------------------------------------------------------------------  I have taken a history, reviewed the chart and examined the patient. I performed a substantive portion of this encounter, including complete performance of at least one of the key components, in conjunction with the APP. I agree with the APP's Carter, impression and recommendations.  Patient has had recurrent hematochezia today with associated drop in hemoglobin.  Blood pressure has been on the low side (systolics 100s).  Patient is frustrated by the recurrent bleeding.  She is agreeable to proceeding with a colonoscopy tomorrow to try and identify bleeding source and provide hemostasis.  Will plan for bowel prep this evening and colonoscopy tomorrow morning.  Should the patient continues to experience frequent large-volume hematochezia and any hemodynamic instability, would repeat a CTA today. We did discuss the inherent difficulties to identifying and treating culprit diverticula during a colonoscopy, but I believe that this is the appropriate next step given her ongoing bleeding and previously negative CTA. Okay to continue clear liquid diet from GI standpoint. Colonoscopy scheduled for 9 AM tomorrow morning.  The details, risks (including bleeding, perforation, infection, missed lesions, medication reactions and possible hospitalization or surgery if complications occur), benefits, and alternatives to colonoscopy with possible biopsy  and possible polypectomy were discussed with the patient and she consents to proceed.    Yisell Sprunger E. Stacia, MD [facility]  I spent a total of 35 minutes reviewing the patient's medical record, interviewing and examining the patient, discussing her diagnosis and management of her condition going forward, and documenting in the medical record

## 2024-04-17 ENCOUNTER — Inpatient Hospital Stay (HOSPITAL_COMMUNITY): Admitting: Anesthesiology

## 2024-04-17 ENCOUNTER — Encounter (HOSPITAL_COMMUNITY): Admission: EM | Disposition: A | Payer: Self-pay | Source: Skilled Nursing Facility | Attending: Internal Medicine

## 2024-04-17 DIAGNOSIS — K6389 Other specified diseases of intestine: Secondary | ICD-10-CM | POA: Diagnosis not present

## 2024-04-17 DIAGNOSIS — D120 Benign neoplasm of cecum: Secondary | ICD-10-CM

## 2024-04-17 DIAGNOSIS — D122 Benign neoplasm of ascending colon: Secondary | ICD-10-CM | POA: Diagnosis not present

## 2024-04-17 DIAGNOSIS — I10 Essential (primary) hypertension: Secondary | ICD-10-CM

## 2024-04-17 DIAGNOSIS — D123 Benign neoplasm of transverse colon: Secondary | ICD-10-CM

## 2024-04-17 DIAGNOSIS — K621 Rectal polyp: Secondary | ICD-10-CM

## 2024-04-17 DIAGNOSIS — K922 Gastrointestinal hemorrhage, unspecified: Secondary | ICD-10-CM | POA: Diagnosis not present

## 2024-04-17 DIAGNOSIS — C187 Malignant neoplasm of sigmoid colon: Principal | ICD-10-CM

## 2024-04-17 DIAGNOSIS — Z87891 Personal history of nicotine dependence: Secondary | ICD-10-CM

## 2024-04-17 DIAGNOSIS — K5730 Diverticulosis of large intestine without perforation or abscess without bleeding: Secondary | ICD-10-CM

## 2024-04-17 DIAGNOSIS — J45909 Unspecified asthma, uncomplicated: Secondary | ICD-10-CM

## 2024-04-17 DIAGNOSIS — K5731 Diverticulosis of large intestine without perforation or abscess with bleeding: Secondary | ICD-10-CM

## 2024-04-17 HISTORY — PX: COLONOSCOPY: SHX5424

## 2024-04-17 LAB — BASIC METABOLIC PANEL WITH GFR
Anion gap: 8 (ref 5–15)
BUN: 16 mg/dL (ref 8–23)
CO2: 26 mmol/L (ref 22–32)
Calcium: 8.9 mg/dL (ref 8.9–10.3)
Chloride: 112 mmol/L — ABNORMAL HIGH (ref 98–111)
Creatinine, Ser: 0.77 mg/dL (ref 0.44–1.00)
GFR, Estimated: >60 mL/min (ref >60)
Glucose, Bld: 119 mg/dL — ABNORMAL HIGH (ref 70–99)
Potassium: 4.5 mmol/L (ref 3.5–5.1)
Sodium: 146 mmol/L — ABNORMAL HIGH (ref 135–145)

## 2024-04-17 LAB — CBC
HCT: 28.7 % — ABNORMAL LOW (ref 36.0–46.0)
Hemoglobin: 9.3 g/dL — ABNORMAL LOW (ref 12.0–15.0)
MCH: 28.9 pg (ref 26.0–34.0)
MCHC: 32.4 g/dL (ref 30.0–36.0)
MCV: 89.1 fL (ref 80.0–100.0)
Platelets: 273 K/uL (ref 150–400)
RBC: 3.22 MIL/uL — ABNORMAL LOW (ref 3.87–5.11)
RDW: 18 % — ABNORMAL HIGH (ref 11.5–15.5)
WBC: 4.7 K/uL (ref 4.0–10.5)
nRBC: 0.4 % — ABNORMAL HIGH (ref 0.0–0.2)

## 2024-04-17 MED ORDER — SPOT INK MARKER SYRINGE KIT
PACK | SUBMUCOSAL | Status: DC | PRN
  Administered 2024-04-17: 7 mL via SUBMUCOSAL

## 2024-04-17 MED ORDER — EPHEDRINE SULFATE (PRESSORS) 25 MG/5ML IV SOSY
PREFILLED_SYRINGE | INTRAVENOUS | Status: DC | PRN
Start: 2024-04-17 — End: 2024-04-17
  Administered 2024-04-17: 5 mg via INTRAVENOUS

## 2024-04-17 MED ORDER — PROPOFOL 10 MG/ML IV BOLUS
INTRAVENOUS | Status: DC | PRN
  Administered 2024-04-17: 20 mg via INTRAVENOUS

## 2024-04-17 MED ORDER — PHENYLEPHRINE 80 MCG/ML (10ML) SYRINGE FOR IV PUSH (FOR BLOOD PRESSURE SUPPORT)
PREFILLED_SYRINGE | INTRAVENOUS | Status: DC | PRN
  Administered 2024-04-17: 80 ug via INTRAVENOUS
  Administered 2024-04-17: 160 ug via INTRAVENOUS
  Administered 2024-04-17 (×3): 80 ug via INTRAVENOUS

## 2024-04-17 MED ORDER — PROPOFOL 500 MG/50ML IV EMUL
INTRAVENOUS | Status: DC | PRN
Start: 2024-04-17 — End: 2024-04-17
  Administered 2024-04-17: 180 ug/kg/min via INTRAVENOUS

## 2024-04-17 MED ORDER — LIDOCAINE 2% (20 MG/ML) 5 ML SYRINGE
INTRAMUSCULAR | Status: DC | PRN
  Administered 2024-04-17: 40 mg via INTRAVENOUS

## 2024-04-17 NOTE — Plan of Care (Signed)
  Problem: Education: Goal: Knowledge of General Education information will improve Description: Including pain rating scale, medication(s)/side effects and non-pharmacologic comfort measures Outcome: Progressing   Problem: Clinical Measurements: Goal: Will remain free from infection Outcome: Progressing Goal: Cardiovascular complication will be avoided Outcome: Progressing   Problem: Elimination: Goal: Will not experience complications related to bowel motility Outcome: Progressing Goal: Will not experience complications related to urinary retention Outcome: Progressing   Problem: Pain Managment: Goal: General experience of comfort will improve and/or be controlled Outcome: Progressing   Problem: Safety: Goal: Ability to remain free from injury will improve Outcome: Progressing   Problem: Skin Integrity: Goal: Risk for impaired skin integrity will decrease Outcome: Progressing   Problem: Education: Goal: Ability to identify signs and symptoms of gastrointestinal bleeding will improve Outcome: Progressing

## 2024-04-17 NOTE — Assessment & Plan Note (Addendum)
 Hematochezia, clinically suspect diverticular bleeding CTA negative for active bleeding on 11/19 [facility] consulting Patient has had a very remote colonoscopy Diet advanced to soft Had recurrent bleeding episodes, changed back to CLD, and continuing to monitor Given ongoing bleeding, underwent colonoscopy on 11/22 2 large polyps noted, will need further treatment depending on pathology Bleeding is presumed to be related to diverticular bleeding, which was no longer present

## 2024-04-17 NOTE — Op Note (Signed)
 [facility] Patient Name: Laura Carter Procedure Date: 04/17/2024 MRN: [id number] Attending MD: Glendia BRAVO. Stacia , MD, [id number] Date of Birth: [birth year] CSN: [id number] Age: 83 Admit Type: Inpatient Procedure:                Colonoscopy Indications:              Hematochezia, Acute post hemorrhagic anemia Providers:                Glendia E. Stacia, MD, Darleene Bare, RN, Fairy [place], Technician Referring MD:              Medicines:                Monitored Anesthesia Care Complications:            No immediate complications. Estimated Blood Loss:     Estimated blood loss was minimal. Procedure:                Pre-Anesthesia Assessment:                           - Prior to the procedure, a History and Physical                            was performed, and patient medications and                            allergies were reviewed. The patient's tolerance of                            previous anesthesia was also reviewed. The risks                            and benefits of the procedure and the sedation                            options and risks were discussed with the patient.                            All questions were answered, and informed consent                            was obtained. Prior Anticoagulants: The patient has                            taken no anticoagulant or antiplatelet agents. ASA                            Grade Assessment: III - A patient with severe                            systemic disease. After reviewing the risks and  benefits, the patient was deemed in satisfactory                            condition to undergo the procedure.                           After obtaining informed consent, the colonoscope                            was passed under direct vision. Throughout the                            procedure, the patient's blood pressure, pulse, and                             oxygen  saturations were monitored continuously. The                            [id number]-[id number] ([id number]) colonoscope was introduced                            through the anus and advanced to the the terminal                            ileum, with identification of the appendiceal                            orifice and IC valve. The colonoscopy was performed                            without difficulty. The patient tolerated the                            procedure well. The quality of the bowel                            preparation was good. The terminal ileum, ileocecal                            valve, appendiceal orifice, and rectum were                            photographed. The bowel preparation used was SUPREP                            via split dose instruction. Scope In: 9:19:26 AM Scope Out: 10:04:36 AM Scope Withdrawal Time: 0 hours 37 minutes 38 seconds  Total Procedure Duration: 0 hours 45 minutes 10 seconds  Findings:      The perianal and digital rectal examinations were normal. Pertinent       negatives include normal sphincter tone and no palpable rectal lesions.      A diffuse area of moderate melanosis was found in the ascending colon       and in the cecum. Biopsies were taken with a cold  forceps for histology.       Estimated blood loss was minimal.      A 4 mm polyp was found in the cecum. The polyp was sessile. The polyp       was removed with a cold snare. Resection and retrieval were complete.       Estimated blood loss was minimal.      A 6 mm polyp was found in the ascending colon. The polyp was flat. The       polyp was removed with a cold snare. Resection and retrieval were       complete. Estimated blood loss was minimal.      A 15 mm polyp was found in the ascending colon. The polyp was sessile.       The polyp was removed with a piecemeal technique using a cold snare.       Resection and retrieval were complete. Estimated blood loss was minimal.        To prevent bleeding after the polypectomy, two hemostatic clips were       successfully placed (MR conditional). Clip manufacturer: [employer]. There was no bleeding at the end of the maneuver. Estimated       blood loss was minimal.      Three sessile polyps were found in the transverse colon. The polyps were       3 to 4 mm in size. These polyps were removed with a cold snare.       Resection and retrieval were complete. Estimated blood loss was minimal.      A 20 mm polyp was found in the sigmoid colon. The polyp was sessile. The       polyp had a depressed center and there appeared to be tethering of       adjacent mucosa. Biopsies were taken with a cold forceps for histology.       Estimated blood loss was minimal. A tattoo was placed 3-4 cm proximal to       the lesion and 2 cm distal with a total injection of 7 mL of Spot       (carbon black). Estimated blood loss was minimal.      An 18 mm polyp was found in the distal rectum. The polyp was sessile.      Many large-mouthed and small-mouthed diverticula were found in the       sigmoid colon, descending colon and transverse colon. There was no       evidence of diverticular bleeding.      The exam was otherwise normal throughout the examined colon.      The terminal ileum appeared normal.      The retroflexed view of the distal rectum and anal verge was normal and       showed no anal or rectal abnormalities. Impression:               - Melanosis in the colon. Biopsied.                           - One 4 mm polyp in the cecum, removed with a cold                            snare. Resected and retrieved.                           -  One 6 mm polyp in the ascending colon, removed                            with a cold snare. Resected and retrieved.                           - One 15 mm polyp in the ascending colon, removed                            piecemeal using a cold snare. Resected and                             retrieved. Clips (MR conditional) were placed. Clip                            manufacturer: [employer].                           - Three 3 to 4 mm polyps in the transverse colon,                            removed with a cold snare. Resected and retrieved.                           - One 20 mm polyp in the sigmoid colon. Biopsied.                            Tattooed. 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                           - One 18 mm polyp in the distal rectum.                           - Severe diverticulosis in the sigmoid colon, in                            the descending colon and mild diverticulosis in the                            transverse colon. There was no evidence of                            diverticular bleeding and no bleeding stigmata.                           - The examined portion of the ileum was normal.                           - The distal rectum and anal verge are normal on  retroflexion view.                           - The patient's acute bleeding and acute drop in                            hemoglobin are secondary to diverticular bleeding,                            which appears to have resolved spontaneously. None                            of the polyps seen would account for large volume                            hematochezia. Moderate Sedation:      Not Applicable - Patient had care per Anesthesia. Recommendation:           - Return patient to hospital ward for ongoing care.                           - Resume regular diet.                           - Continue present medications.                           - Await pathology results.                           - Repeat colonoscopy (date not yet determined) for                            surveillance based on pathology results.                           - Obtain CEA level.                           - Will need to have  large rectal polyp removed                            removed via EMR. Treatment of large sigmoid polyp                            will be based on pathology results. Procedure Code(s):        --- Professional ---                           [id number], Colonoscopy, flexible; with removal of                            tumor(s), polyp(s), or other lesion(s) by snare                            technique  54619, 59, Colonoscopy, flexible; with biopsy,                            single or multiple                           45381, Colonoscopy, flexible; with directed                            submucosal injection(s), any substance Diagnosis Code(s):        --- Professional ---                           D12.0, Benign neoplasm of cecum                           D12.2, Benign neoplasm of ascending colon                           D12.5, Benign neoplasm of sigmoid colon                           D12.8, Benign neoplasm of rectum                           D12.3, Benign neoplasm of transverse colon (hepatic                            flexure or splenic flexure)                           K92.1, Melena (includes Hematochezia)                           D62, Acute posthemorrhagic anemia                           K57.30, Diverticulosis of large intestine without                            perforation or abscess without bleeding CPT copyright 2022 American Medical Association. All rights reserved. The codes documented in this report are preliminary and upon coder review may  be revised to meet current compliance requirements. Arlee Bossard E. Stacia, MD 04/17/2024 10:27:25 AM This report has been signed electronically. Number of Addenda: 0

## 2024-04-17 NOTE — Assessment & Plan Note (Addendum)
 Pre-op Hgb 13.7, down to 8.5 on presentation (but some of this may be related to peri-operative blood loss) S/p PRBC x 2 Hgb 9.3 today Will monitor for 1 additional day

## 2024-04-17 NOTE — Interval H&P Note (Signed)
 History and Physical Interval Note:  04/17/2024 8:56 AM  Laura Carter  has presented today for surgery, with the diagnosis of GI bleed, anemia.  The various methods of treatment have been discussed with the patient and family. After consideration of risks, benefits and other options for treatment, the patient has consented to  Procedure(s): COLONOSCOPY (N/A) as a surgical intervention.  The patient's history has been reviewed, patient examined, no change in status, stable for surgery.  I have reviewed the patient's chart and labs.  Questions were answered to the patient's satisfaction.     Glendia FORBES Holt

## 2024-04-17 NOTE — Anesthesia Postprocedure Evaluation (Signed)
 Anesthesia Post Note  Patient: Laura Carter  Procedure(s) Performed: COLONOSCOPY     Patient location during evaluation: PACU Anesthesia Type: MAC Level of consciousness: awake and alert Pain management: pain level controlled Vital Signs Assessment: post-procedure vital signs reviewed and stable Respiratory status: spontaneous breathing, nonlabored ventilation, respiratory function stable and patient connected to nasal cannula oxygen  Cardiovascular status: stable and blood pressure returned to baseline Postop Assessment: no apparent nausea or vomiting Anesthetic complications: no   No notable events documented.  Last Vitals:  Vitals:   04/17/24 1220 04/17/24 1231  BP:  (!) 103/55  Pulse:  65  Resp:  12  Temp:  36.7 C  SpO2: 98%     Last Pain:  Vitals:   04/17/24 1045  TempSrc:   PainSc: 0-No pain                 Epifanio Lamar BRAVO

## 2024-04-17 NOTE — Assessment & Plan Note (Signed)
-   Appears compensated at this time ?

## 2024-04-17 NOTE — Assessment & Plan Note (Signed)
 Surgical wound looks clean no signs of infection Resume Home PT at time of dc

## 2024-04-17 NOTE — Progress Notes (Signed)
 Progress Note   Patient: Laura Carter FMW:[id number] DOB: Dec 21, 1940 DOA: 04/14/2024     1 DOS: the patient was seen and examined on 04/17/2024   Brief hospital course: 83yo with h/o chronic HFpEF, HTN, mild intermittent asthma, and recent R TKR who presented on 11/19 with BRBPR.  Hgb 13 -> 8.5, transfused 1 unit PRBC and then another during the hospitalization.  GI consulted.  Colonoscopy 11/22 with several polyps including a 22mm in sigmoid colon and 18mm in distal rectum, awaiting pathology; also with may large-mouthed and small-mouthed diverticula throughout the colon without evidence of bleeding.    Assessment & Plan Lower GI bleed Hematochezia, clinically suspect diverticular bleeding CTA negative for active bleeding on 11/19 [facility] consulting Patient has had a very remote colonoscopy Diet advanced to soft Had recurrent bleeding episodes, changed back to CLD, and continuing to monitor Given ongoing bleeding, underwent colonoscopy on 11/22 2 large polyps noted, will need further treatment depending on pathology Bleeding is presumed to be related to diverticular bleeding, which was no longer present ABLA (acute blood loss anemia) Pre-op Hgb 13.7, down to 8.5 on presentation (but some of this may be related to peri-operative blood loss) S/p PRBC x 2 Hgb 9.3 today Will monitor for 1 additional day Diastolic dysfunction Appears compensated at this time Essential hypertension BP 103/55 Holding metoprolol  for now Mild intermittent asthma No symptoms or signs of acute exacerbation Placed on [place] O2 during hospitalization for comfort without documented hypoxia Continue Advair and as needed albuterol  Weaned to RA S/P total knee arthroplasty, right Surgical wound looks clean no signs of infection Resume Home PT at time of dc Class 1 obesity due to excess calories with body mass index (BMI) of 32.0 to 32.9 in adult Body mass index is 32.26 kg/m.SABRA  Weight loss should be  encouraged Outpatient PCP/bariatric medicine f/u encouraged Significantly low or high BMI is associated with higher medical risk including morbidity and mortality       Consultants: GI   Procedures: Colonoscopy   Antibiotics: None    30 Day Unplanned Readmission Risk Score    Flowsheet Row ED to Hosp-Admission (Current) from 04/14/2024 in [facility]  30 Day Unplanned Readmission Risk Score (%) 7.18 Filed at 04/17/2024 0800    This score is the patient's risk of an unplanned readmission within 30 days of being discharged (0 -100%). The score is based on dignosis, age, lab data, medications, orders, and past utilization.   Low:  0-14.9   Medium: 15-21.9   High: 22-29.9   Extreme: 30 and above           Subjective: Seen in PACU and then upon arrival on floor.  No specific complaints, disappointed to not be discharging today.   Objective: Vitals:   04/17/24 1220 04/17/24 1231  BP:  (!) 103/55  Pulse:  65  Resp:  12  Temp:  98 F (36.7 C)  SpO2: 98%     Intake/Output Summary (Last 24 hours) at 04/17/2024 1430 Last data filed at 04/17/2024 1300 Gross per 24 hour  Intake 1526.73 ml  Output --  Net 1526.73 ml   Filed Weights   04/14/24 0737 04/17/24 0902  Weight: 82.6 kg 82.6 kg    Exam:  General:  Appears calm and comfortable and is in NAD Eyes:  normal lids, iris ENT:  grossly normal hearing, lips & tongue, mmm Cardiovascular:  RRR. No LE edema.  Respiratory:   CTA bilaterally with  no wheezes/rales/rhonchi.  Normal respiratory effort. Abdomen:  soft, NT, ND Skin:  no rash or induration seen on limited exam Musculoskeletal:  grossly normal tone BUE/BLE, good ROM, no bony abnormality Psychiatric:  sedated and then grossly normal mood and affect, speech fluent and appropriate, AOx3 Neurologic:  CN 2-12 grossly intact, moves all extremities in coordinated fashion  Data Reviewed: I have reviewed the patient's lab  results since admission.  Pertinent labs for today include:   Na++ 146, not clinically significant Glucose 119 WBC 4.7 Hgb 9.3     Family Communication: None present; Dr. Stacia spoke with her husband by telephone today       Code Status: Full Code   Disposition: Status is: Inpatient Remains inpatient appropriate because: ongoing monitoring, anticipate dc on 11/23     Time spent: 50 minutes  Unresulted Labs (From admission, onward)     Start     Ordered   04/18/24 0500  Basic metabolic panel with GFR  Tomorrow morning,   R       Question:  Specimen collection method  Answer:  Lab=Lab collect   04/17/24 1430   04/18/24 0500  CBC  Tomorrow morning,   R       Question:  Specimen collection method  Answer:  Lab=Lab collect   04/17/24 1430             Author: Delon Herald, MD 04/17/2024 2:30 PM  For on call review [email or web address].

## 2024-04-17 NOTE — Plan of Care (Signed)
  Problem: Clinical Measurements: Goal: Ability to maintain clinical measurements within normal limits will improve Outcome: Progressing   Problem: Clinical Measurements: Goal: Will remain free from infection Outcome: Progressing   Problem: Nutrition: Goal: Adequate nutrition will be maintained Outcome: Progressing   Problem: Pain Managment: Goal: General experience of comfort will improve and/or be controlled Outcome: Progressing   Problem: Safety: Goal: Ability to remain free from injury will improve Outcome: Progressing

## 2024-04-17 NOTE — Assessment & Plan Note (Addendum)
 No symptoms or signs of acute exacerbation Placed on [place] O2 during hospitalization for comfort without documented hypoxia Continue Advair and as needed albuterol  Weaned to RA

## 2024-04-17 NOTE — Progress Notes (Signed)
   04/17/24 2300  BiPAP/CPAP/SIPAP  BiPAP/CPAP/SIPAP Pt Type Adult  Reason BIPAP/CPAP not in use Non-compliant

## 2024-04-17 NOTE — Anesthesia Preprocedure Evaluation (Signed)
 Anesthesia Evaluation  Patient identified by MRN, date of birth, ID band Patient awake    Reviewed: Allergy & Precautions, NPO status , Patient's Chart, lab work & pertinent test results  History of Anesthesia Complications Negative for: history of anesthetic complications  Airway Mallampati: III  TM Distance: <3 FB Neck ROM: Full    Dental  (+) Teeth Intact, Dental Advisory Given   Pulmonary asthma , sleep apnea , former smoker   breath sounds clear to auscultation       Cardiovascular hypertension,  Rhythm:Regular Rate:Normal     Neuro/Psych    GI/Hepatic hiatal hernia,,,  Endo/Other    Renal/GU      Musculoskeletal  (+) Arthritis ,    Abdominal   Peds  Hematology   Anesthesia Other Findings   Reproductive/Obstetrics                              Anesthesia Physical Anesthesia Plan  ASA: 3  Anesthesia Plan: MAC   Post-op Pain Management:    Induction: Intravenous  PONV Risk Score and Plan: 2 and Treatment may vary due to age or medical condition and Propofol  infusion  Airway Management Planned: Simple Face Mask and Natural Airway  Additional Equipment:   Intra-op Plan:   Post-operative Plan:   Informed Consent: I have reviewed the patients History and Physical, chart, labs and discussed the procedure including the risks, benefits and alternatives for the proposed anesthesia with the patient or authorized representative who has indicated his/her understanding and acceptance.       Plan Discussed with: CRNA  Anesthesia Plan Comments:          Anesthesia Quick Evaluation

## 2024-04-17 NOTE — Assessment & Plan Note (Addendum)
 Body mass index is 32.26 kg/m.Laura Carter  Weight loss should be encouraged Outpatient PCP/bariatric medicine f/u encouraged Significantly low or high BMI is associated with higher medical risk including morbidity and mortality

## 2024-04-17 NOTE — Transfer of Care (Signed)
 Immediate Anesthesia Transfer of Care Note  Patient: Laura Carter  Procedure(s) Performed: COLONOSCOPY  Patient Location: PACU  Anesthesia Type:MAC  Level of Consciousness: drowsy and patient cooperative  Airway & Oxygen  Therapy: Patient Spontanous Breathing and Patient connected to face mask oxygen   Post-op Assessment: Report given to RN and Post -op Vital signs reviewed and stable  Post vital signs: Reviewed and stable  Last Vitals:  Vitals Value Taken Time  BP 94/59 04/17/24 10:17  Temp 36 C 04/17/24 10:16  Pulse 61 04/17/24 10:19  Resp 17 04/17/24 10:19  SpO2 98 % 04/17/24 10:19  Vitals shown include unfiled device data.  Last Pain:  Vitals:   04/17/24 0902  TempSrc: Temporal  PainSc: 0-No pain         Complications: No notable events documented.

## 2024-04-17 NOTE — Assessment & Plan Note (Addendum)
 BP 103/55 Holding metoprolol  for now

## 2024-04-18 ENCOUNTER — Encounter (HOSPITAL_COMMUNITY): Payer: Self-pay | Admitting: Gastroenterology

## 2024-04-18 DIAGNOSIS — K922 Gastrointestinal hemorrhage, unspecified: Secondary | ICD-10-CM | POA: Diagnosis not present

## 2024-04-18 LAB — BASIC METABOLIC PANEL WITH GFR
Anion gap: 8 (ref 5–15)
BUN: 12 mg/dL (ref 8–23)
CO2: 24 mmol/L (ref 22–32)
Calcium: 8.5 mg/dL — ABNORMAL LOW (ref 8.9–10.3)
Chloride: 109 mmol/L (ref 98–111)
Creatinine, Ser: 0.71 mg/dL (ref 0.44–1.00)
GFR, Estimated: >60 mL/min (ref >60)
Glucose, Bld: 113 mg/dL — ABNORMAL HIGH (ref 70–99)
Potassium: 4 mmol/L (ref 3.5–5.1)
Sodium: 141 mmol/L (ref 135–145)

## 2024-04-18 LAB — CBC
HCT: 28.6 % — ABNORMAL LOW (ref 36.0–46.0)
Hemoglobin: 8.9 g/dL — ABNORMAL LOW (ref 12.0–15.0)
MCH: 28.6 pg (ref 26.0–34.0)
MCHC: 31.1 g/dL (ref 30.0–36.0)
MCV: 92 fL (ref 80.0–100.0)
Platelets: 263 K/uL (ref 150–400)
RBC: 3.11 MIL/uL — ABNORMAL LOW (ref 3.87–5.11)
RDW: 18.4 % — ABNORMAL HIGH (ref 11.5–15.5)
WBC: 6.1 K/uL (ref 4.0–10.5)
nRBC: 0 % (ref 0.0–0.2)

## 2024-04-18 NOTE — Assessment & Plan Note (Signed)
 No symptoms or signs of acute exacerbation Placed on [place] O2 during hospitalization for comfort without documented hypoxia Continue Advair and as needed albuterol  Weaned to RA

## 2024-04-18 NOTE — Assessment & Plan Note (Signed)
 Surgical wound looks clean no signs of infection Resume Home PT at time of dc

## 2024-04-18 NOTE — Plan of Care (Signed)

## 2024-04-18 NOTE — Assessment & Plan Note (Addendum)
 BP 122/62 Holding metoprolol  for now

## 2024-04-18 NOTE — Plan of Care (Signed)
  Problem: Education: Goal: Knowledge of General Education information will improve Description: Including pain rating scale, medication(s)/side effects and non-pharmacologic comfort measures Outcome: Adequate for Discharge   Problem: Health Behavior/Discharge Planning: Goal: Ability to manage health-related needs will improve Outcome: Adequate for Discharge   Problem: Clinical Measurements: Goal: Ability to maintain clinical measurements within normal limits will improve Outcome: Adequate for Discharge Goal: Will remain free from infection Outcome: Adequate for Discharge Goal: Diagnostic test results will improve Outcome: Adequate for Discharge Goal: Respiratory complications will improve Outcome: Adequate for Discharge Goal: Cardiovascular complication will be avoided Outcome: Adequate for Discharge   Problem: Activity: Goal: Risk for activity intolerance will decrease Outcome: Adequate for Discharge   Problem: Nutrition: Goal: Adequate nutrition will be maintained Outcome: Adequate for Discharge   Problem: Coping: Goal: Level of anxiety will decrease Outcome: Adequate for Discharge   Problem: Elimination: Goal: Will not experience complications related to bowel motility Outcome: Adequate for Discharge Goal: Will not experience complications related to urinary retention Outcome: Adequate for Discharge   Problem: Pain Managment: Goal: General experience of comfort will improve and/or be controlled Outcome: Adequate for Discharge   Problem: Safety: Goal: Ability to remain free from injury will improve Outcome: Adequate for Discharge   Problem: Skin Integrity: Goal: Risk for impaired skin integrity will decrease Outcome: Adequate for Discharge   Problem: Education: Goal: Ability to identify signs and symptoms of gastrointestinal bleeding will improve Outcome: Adequate for Discharge   Problem: Bowel/Gastric: Goal: Will show no signs and symptoms of  gastrointestinal bleeding Outcome: Adequate for Discharge   Problem: Fluid Volume: Goal: Will show no signs and symptoms of excessive bleeding Outcome: Adequate for Discharge   Problem: Clinical Measurements: Goal: Complications related to the disease process, condition or treatment will be avoided or minimized Outcome: Adequate for Discharge

## 2024-04-18 NOTE — Assessment & Plan Note (Addendum)
 Hematochezia, clinically suspect diverticular bleeding CTA negative for active bleeding on 11/19 [facility] consulting Given ongoing bleeding, underwent colonoscopy on 11/22 2 large polyps noted, will need further treatment depending on pathology Bleeding is presumed to be related to diverticular bleeding, which was no longer present She feels great and wants to go home today

## 2024-04-18 NOTE — Assessment & Plan Note (Addendum)
 Pre-op Hgb 13.7, down to 8.5 on presentation (but some of this may be related to peri-operative blood loss) S/p PRBC x 2 Hgb stable

## 2024-04-18 NOTE — Assessment & Plan Note (Signed)
-   Appears compensated at this time ?

## 2024-04-18 NOTE — Discharge Summary (Signed)
 Physician Discharge Summary   Patient: Laura Carter MRN: [id number] DOB: 1941/02/01  Admit date:     04/14/2024  Discharge date: 04/18/24  Discharge Physician: Delon Herald   PCP: Rolinda Millman, MD   Recommendations at discharge:   Continue to monitor for recurrent GI bleeding Hold metoprolol  for now; resume when BP is consistently >140/90 Follow up with Dr. Rolinda in 1-2 weeks You will need close GI follow up to discuss CEA level (blood work drawn prior to [employer]) and pathology results and to plan for repeat colonoscopy  Discharge Diagnoses: Principal Problem:   Lower GI bleed Active Problems:   Diastolic dysfunction   S/P total knee arthroplasty, right   Essential hypertension   Mild intermittent asthma   Class 1 obesity due to excess calories with body mass index (BMI) of 32.0 to 32.9 in adult   ABLA (acute blood loss anemia)   Mass of colon   Diverticulosis of colon with hemorrhage   Benign neoplasm of ascending colon    Hospital Course: 83yo with h/o chronic HFpEF, HTN, mild intermittent asthma, and recent R TKR who presented on 11/19 with BRBPR.  Hgb 13 -> 8.5, transfused 1 unit PRBC and then another during the hospitalization.  GI consulted.  Colonoscopy 11/22 with several polyps including a 22mm in sigmoid colon and 18mm in distal rectum, awaiting pathology; also with may large-mouthed and small-mouthed diverticula throughout the colon without evidence of bleeding.  Assessment and Plan:  Assessment & Plan Lower GI bleed Hematochezia, clinically suspect diverticular bleeding CTA negative for active bleeding on 11/19 [facility] consulting Given ongoing bleeding, underwent colonoscopy on 11/22 2 large polyps noted, will need further treatment depending on pathology Bleeding is presumed to be related to diverticular bleeding, which was no longer present She feels great and wants to go home today ABLA (acute blood loss anemia) Pre-op Hgb 13.7, down to 8.5 on  presentation (but some of this may be related to peri-operative blood loss) S/p PRBC x 2 Hgb stable Diastolic dysfunction Appears compensated at this time Essential hypertension BP 122/62 Holding metoprolol  for now Mild intermittent asthma No symptoms or signs of acute exacerbation Placed on [place] O2 during hospitalization for comfort without documented hypoxia Continue Advair and as needed albuterol  Weaned to RA S/P total knee arthroplasty, right Surgical wound looks clean no signs of infection Resume Home PT at time of dc Class 1 obesity due to excess calories with body mass index (BMI) of 32.0 to 32.9 in adult Body mass index is 32.26 kg/m.SABRA  Weight loss should be encouraged Outpatient PCP/bariatric medicine f/u encouraged Significantly low or high BMI is associated with higher medical risk including morbidity and mortality        Consultants: GI   Procedures: Colonoscopy 11/22   Antibiotics: None   Pain control - [place]  Controlled Substance Reporting System database was reviewed. and patient was instructed, not to drive, operate heavy machinery, perform activities at heights, swimming or participation in water  activities or provide baby-sitting services while on Pain, Sleep and Anxiety Medications; until their outpatient Physician has advised to do so again. Also recommended to not to take more than prescribed Pain, Sleep and Anxiety Medications.   Disposition: Home Diet recommendation:  Cardiac diet DISCHARGE MEDICATION: Allergies as of 04/18/2024   No Known Allergies      Medication List     PAUSE taking these medications    metoprolol  succinate 25 MG 24 hr tablet Wait to take this until your doctor or  other care provider tells you to start again. Commonly known as: TOPROL -XL TAKE (1) TABLET BY MOUTH ONCE DAILY.       STOP taking these medications    HYDROcodone -acetaminophen  10-325 MG tablet Commonly known as: NORCO   meloxicam  15 MG  tablet Commonly known as: MOBIC    methocarbamol  750 MG tablet Commonly known as: ROBAXIN    ondansetron  4 MG disintegrating tablet Commonly known as: ZOFRAN -ODT       TAKE these medications    acetaminophen  500 MG tablet Commonly known as: TYLENOL  Take 2 tablets (1,000 mg total) by mouth 2 (two) times daily as needed for mild pain (pain score 1-3) or moderate pain (pain score 4-6). What changed: when to take this   Advair Diskus 100-50 MCG/ACT Aepb Generic drug: fluticasone -salmeterol Inhale 1 puff into the lungs in the morning and at bedtime.   albuterol  108 (90 Base) MCG/ACT inhaler Commonly known as: VENTOLIN  HFA Inhale 2 puffs into the lungs every 6 (six) hours as needed. What changed: reasons to take this   APPLE CIDER VINEGAR PO Take 30 mLs by mouth daily.   aspirin  EC 81 MG tablet Take 1 tablet (81 mg total) by mouth 2 (two) times daily. To prevent blood clots for 30 days after surgery.   azelastine  0.1 % nasal spray Commonly known as: ASTELIN  Place 1 spray into both nostrils 2 (two) times daily. Use in each nostril as directed What changed:  when to take this reasons to take this additional instructions   b complex vitamins capsule Take 2 capsules by mouth at bedtime.   esomeprazole 20 MG capsule Commonly known as: NEXIUM Take 20 mg by mouth at bedtime.   NON FORMULARY Pt uses a cpap nightly        Discharge Exam:   Subjective: Feels well and very eager for dc.   Objective: Vitals:   04/18/24 0512 04/18/24 0519  BP: (!) 117/105 122/62  Pulse: 72 67  Resp: 18   Temp: 98.6 F (37 C)   SpO2:      Intake/Output Summary (Last 24 hours) at 04/18/2024 0902 Last data filed at 04/17/2024 1700 Gross per 24 hour  Intake 1160 ml  Output --  Net 1160 ml   Filed Weights   04/14/24 0737 04/17/24 0902  Weight: 82.6 kg 82.6 kg    Exam:  General:  Appears calm and comfortable and is in NAD, on RA Eyes:  normal lids, iris ENT:  grossly  normal hearing, lips & tongue, mmm Cardiovascular:  RRR, no m/r/g. No LE edema.  Respiratory:   CTA bilaterally with no wheezes/rales/rhonchi.  Normal respiratory effort. Abdomen:  soft, NT, ND Skin:  no rash or induration seen on limited exam Musculoskeletal:  grossly normal tone BUE/BLE, good ROM, no bony abnormality Psychiatric:  grossly normal mood and affect, speech fluent and appropriate, AOx3 Neurologic:  CN 2-12 grossly intact, moves all extremities in coordinated fashion, sensation intact  Data Reviewed: I have reviewed the patient's lab results since admission.  Pertinent labs for today include:   Stable BMP Stable CBC Pathology pending CEA pending   Condition at discharge: stable  The results of significant diagnostics from this hospitalization (including imaging, microbiology, ancillary and laboratory) are listed below for reference.   Imaging Studies: DG Knee Right Port Result Date: 04/14/2024 CLINICAL DATA:  Status post total knee arthroplasty EXAM: PORTABLE RIGHT KNEE - 1-2 VIEW COMPARISON:  Right knee x-ray 03/30/2024 FINDINGS: There is soft tissue swelling surrounding the knee. Soft tissue air  has resolved in the interval. Right knee total arthroplasty appears in anatomic alignment. No hardware loosening or acute fracture. IMPRESSION: Right knee total arthroplasty appears in anatomic alignment. Electronically Signed   By: Greig Pique M.D.   On: 04/14/2024 23:55   CT Angio Abd/Pel W and/or Wo Contrast Result Date: 04/14/2024 CLINICAL DATA:  Lower GI bleed. EXAM: CTA ABDOMEN AND PELVIS WITHOUT AND WITH CONTRAST TECHNIQUE: Multidetector CT imaging of the abdomen and pelvis was performed using the standard protocol during bolus administration of intravenous contrast. Multiplanar reconstructed images and MIPs were obtained and reviewed to evaluate the vascular anatomy. RADIATION DOSE REDUCTION: This exam was performed according to the departmental dose-optimization program  which includes automated exposure control, adjustment of the mA and/or kV according to patient size and/or use of iterative reconstruction technique. CONTRAST:  OMNIPAQUE  IOHEXOL  350 MG/ML SOLN COMPARISON:  None Available. FINDINGS: VASCULAR Aorta: Moderate atherosclerotic calcification of the abdominal aorta. No aneurysmal dilatation or dissection. No periaortic fluid collection. Celiac: The celiac trunk and its major branches are patent. SMA: The 7 is patent. Renals: The renal arteries are patent. 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 IMA: The IMA is patent. Inflow: Mild atherosclerotic calcification of the iliac arteries. No as well dilatation or dissection. The iliac arteries are patent. Proximal Outflow: The visualized proximal outflow is patent. Veins: The IVC is unremarkable. No portal venous gas. The SMV the, splenic vein, and main portal vein are patent. Review of the MIP images confirms the above findings. NON-VASCULAR Lower chest: No acute abnormality. No intra-abdominal free air or free fluid. Hepatobiliary: The liver is unremarkable. No acute bony hypertension. Cholecystectomy. No retained calcified stone noted in the central CBD. Pancreas: Unremarkable. No pancreatic ductal dilatation or surrounding inflammatory changes. Spleen: Normal in size without focal abnormality. Adrenals/Urinary Tract: The adrenal glands are unremarkable. There is no hydronephrosis or nephrolithiasis on either side. Small bilateral renal cysts. The visualized ureters and urinary bladder probable. Stomach/Bowel: There is a large hiatal hernia containing a portion of the stomach. Severe sigmoid diverticulosis and scattered colonic diverticula. There is no bowel obstruction or active inflammation. The appendix is normal. Lymphatic: No adenopathy. Reproductive: The uterus is anteverted. No suspicious adnexal masses. Other: Fat containing bilateral inguinal hernias, left greater than right. No bowel herniation.  Musculoskeletal: Degenerative changes of spine. No acute osseous pathology. IMPRESSION: 1. No acute intra-abdominal or pelvic pathology. No CT evidence of active GI bleed. 2. Severe sigmoid diverticulosis. No bowel obstruction. Normal appendix. 3. Large hiatal hernia containing a portion of the stomach. 4.  Aortic Atherosclerosis (ICD10-I70.0). Electronically Signed   By: Vanetta Chou M.D.   On: 04/14/2024 08:50   DG Knee Right Port Result Date: 03/30/2024 EXAM: 1 or 2 VIEW(S) XRAY OF THE RIGHT KNEE 03/30/2024 12:13:00 PM COMPARISON: None available. CLINICAL HISTORY: 088092 S/P total knee arthroplasty, right 088092 S/P total knee arthroplasty, right. FINDINGS: BONES AND JOINTS: Changes of right knee replacement. Soft tissue and joint space gas present. No hardware or bony complicating feature. No significant joint effusion. SOFT TISSUES: Soft tissue and joint space gas present. IMPRESSION: 1. Right knee replacement without hardware or bony complication. Electronically signed by: Franky Crease MD 03/30/2024 07:09 PM EST RP Workstation: [id number]    Microbiology: Results for orders placed or performed during the hospital encounter of 03/26/24  Surgical pcr screen     Status: None   Collection Time: 03/26/24  3:19 PM   Specimen: Nasal Mucosa; Nasal Swab  Result Value Ref Range Status  MRSA, PCR NEGATIVE NEGATIVE Final   Staphylococcus aureus NEGATIVE NEGATIVE Final    Comment: (NOTE) The Xpert SA Assay (FDA approved for NASAL specimens in patients 58 years of age and older), is one component of a comprehensive surveillance program. It is not intended to diagnose infection nor to guide or monitor treatment. Performed at [facility], [address] [place], [place] [address]     Labs: CBC: Recent Labs  Lab 04/14/24 0749 04/14/24 0820 04/15/24 1704 04/16/24 0553 04/16/24 2053 04/17/24 0618 04/18/24 0642  WBC 4.8   < > 5.6 6.0 7.3 4.7 6.1  NEUTROABS 3.6  --   --    --   --   --   --   HGB 8.5*   < > 9.4* 8.5* 11.5* 9.3* 8.9*  HCT 27.4*   < > 29.8* 26.5* 36.0 28.7* 28.6*  MCV 94.5   < > 88.4 88.6 88.9 89.1 92.0  PLT 314   < > 276 264 334 273 263   < > = values in this interval not displayed.   Basic Metabolic Panel: Recent Labs  Lab 04/14/24 0749 04/14/24 0820 04/15/24 0629 04/17/24 0618 04/18/24 0642  NA 142 141 141 146* 141  K 4.4 4.4 4.4 4.5 4.0  CL 111 111 110 112* 109  CO2 21*  --  24 26 24   GLUCOSE 149* 145* 114* 119* 113*  BUN 23 26* 19 16 12   CREATININE 0.96 1.00 0.77 0.77 0.71  CALCIUM  8.4*  --  8.9 8.9 8.5*   Liver Function Tests: Recent Labs  Lab 04/14/24 0749  AST 19  ALT 17  ALKPHOS 78  BILITOT 0.4  PROT 5.3*  ALBUMIN 3.2*   CBG: No results for input(s): GLUCAP in the last 168 hours.  Discharge time spent: greater than 30 minutes.  Signed: Delon Herald, MD [facility] 04/18/2024

## 2024-04-18 NOTE — TOC Transition Note (Signed)
 Transition of Care [facility]) - Discharge Note   Patient Details  Name: Laura Carter MRN: [id number] Date of Birth: 1940/11/09  Transition of Care [facility]) CM/SW Contact:  Sonda Manuella Quill, RN Phone Number: 04/18/2024, 9:18 AM   Clinical Narrative:    D/C orders received; no IP CM needs.   Final next level of care: Home/Self Care Barriers to Discharge: No Barriers Identified   Patient Goals and CMS Choice Patient states their goals for this hospitalization and ongoing recovery are:: home     [place] ownership interest in [facility].provided to:: Patient    Discharge Placement                       Discharge Plan and Services Additional resources added to the After Visit Summary for     Discharge Planning Services: CM Consult            DME Arranged: N/A DME Agency: NA       HH Arranged: NA HH Agency: NA        Social Drivers of Health (SDOH) Interventions SDOH Screenings   Food Insecurity: No Food Insecurity (04/15/2024)  Housing: Low Risk  (04/15/2024)  Transportation Needs: No Transportation Needs (04/15/2024)  Utilities: Not At Risk (04/15/2024)  Depression (PHQ2-9): Low Risk  (11/05/2021)  Social Connections: Socially Integrated (04/15/2024)  Tobacco Use: Medium Risk (04/14/2024)     Readmission Risk Interventions     No data to display

## 2024-04-18 NOTE — Assessment & Plan Note (Addendum)
 Body mass index is 32.26 kg/m.SABRA  Weight loss should be encouraged Outpatient PCP/bariatric medicine f/u encouraged Significantly low or high BMI is associated with higher medical risk including morbidity and mortality

## 2024-04-19 LAB — TYPE AND SCREEN
ABO/RH(D): A POS
Antibody Screen: NEGATIVE
Unit division: 0
Unit division: 0
Unit division: 0

## 2024-04-19 LAB — BPAM RBC
Blood Product Expiration Date: 202512082359
Blood Product Expiration Date: 202512122359
Blood Product Expiration Date: 202512192359
ISSUE DATE / TIME: 202511191042
ISSUE DATE / TIME: 202511191430
ISSUE DATE / TIME: 202511211603
Unit Type and Rh: 6200
Unit Type and Rh: 6200
Unit Type and Rh: 6200

## 2024-04-19 LAB — CEA: CEA: 2.2 ng/mL (ref 0.0–4.7)

## 2024-04-20 DIAGNOSIS — M25661 Stiffness of right knee, not elsewhere classified: Secondary | ICD-10-CM | POA: Diagnosis not present

## 2024-04-20 DIAGNOSIS — M6281 Muscle weakness (generalized): Secondary | ICD-10-CM | POA: Diagnosis not present

## 2024-04-20 DIAGNOSIS — M25561 Pain in right knee: Secondary | ICD-10-CM | POA: Diagnosis not present

## 2024-04-20 DIAGNOSIS — Z4789 Encounter for other orthopedic aftercare: Secondary | ICD-10-CM | POA: Diagnosis not present

## 2024-04-21 LAB — SURGICAL PATHOLOGY

## 2024-04-23 DIAGNOSIS — M25561 Pain in right knee: Secondary | ICD-10-CM | POA: Diagnosis not present

## 2024-04-23 DIAGNOSIS — M25661 Stiffness of right knee, not elsewhere classified: Secondary | ICD-10-CM | POA: Diagnosis not present

## 2024-04-23 DIAGNOSIS — Z4789 Encounter for other orthopedic aftercare: Secondary | ICD-10-CM | POA: Diagnosis not present

## 2024-04-23 DIAGNOSIS — M6281 Muscle weakness (generalized): Secondary | ICD-10-CM | POA: Diagnosis not present

## 2024-04-26 ENCOUNTER — Ambulatory Visit: Payer: Self-pay | Admitting: Gastroenterology

## 2024-04-26 DIAGNOSIS — Z96651 Presence of right artificial knee joint: Secondary | ICD-10-CM | POA: Diagnosis not present

## 2024-04-26 DIAGNOSIS — I10 Essential (primary) hypertension: Secondary | ICD-10-CM | POA: Diagnosis not present

## 2024-04-26 DIAGNOSIS — K922 Gastrointestinal hemorrhage, unspecified: Secondary | ICD-10-CM | POA: Diagnosis not present

## 2024-04-26 DIAGNOSIS — C187 Malignant neoplasm of sigmoid colon: Secondary | ICD-10-CM

## 2024-04-26 DIAGNOSIS — Z09 Encounter for follow-up examination after completed treatment for conditions other than malignant neoplasm: Secondary | ICD-10-CM | POA: Diagnosis not present

## 2024-04-26 NOTE — Progress Notes (Signed)
 I spoke with the patient today and relayed the news that the large polyp in her sigmoid colon was consistent with colon cancer, as was my suspicion. I explained that it is very likely that the cancer is localized to her colon and cannot be curable with surgery alone. She had a CT abdomen/pelvis during her admission for diverticular bleed, but we need to get a CT of the chest to complete staging.  Will refer her to colorectal surgery to discuss resection.  We also need to schedule her for a flexible sigmoidoscopy to remove the large polyp in the distal rectum (this polyp was not removed during her hospitalization in the setting of significant diverticular bleed)  The patient continues to feel quite weak and tired from her recent knee surgery and diverticular bleed.  I told her we could wait a while to remove the large rectal polyp.  Team, Please place order for CT chest without contrast for staging of new colon cancer  Please place referral to [facility] (Dr. Teresa, Dr. Debby or Dr. Sheldon)  Please schedule patient for flexible sigmoidoscopy in January to remove rectal polyp.

## 2024-04-27 DIAGNOSIS — M25661 Stiffness of right knee, not elsewhere classified: Secondary | ICD-10-CM | POA: Diagnosis not present

## 2024-04-27 DIAGNOSIS — M25561 Pain in right knee: Secondary | ICD-10-CM | POA: Diagnosis not present

## 2024-04-27 DIAGNOSIS — Z4789 Encounter for other orthopedic aftercare: Secondary | ICD-10-CM | POA: Diagnosis not present

## 2024-04-27 DIAGNOSIS — M6281 Muscle weakness (generalized): Secondary | ICD-10-CM | POA: Diagnosis not present

## 2024-04-28 ENCOUNTER — Ambulatory Visit (HOSPITAL_COMMUNITY): Admission: RE | Admit: 2024-04-28 | Discharge: 2024-04-28 | Attending: Gastroenterology

## 2024-04-28 DIAGNOSIS — C187 Malignant neoplasm of sigmoid colon: Secondary | ICD-10-CM | POA: Diagnosis present

## 2024-04-29 DIAGNOSIS — M25561 Pain in right knee: Secondary | ICD-10-CM | POA: Diagnosis not present

## 2024-04-29 DIAGNOSIS — M6281 Muscle weakness (generalized): Secondary | ICD-10-CM | POA: Diagnosis not present

## 2024-04-29 DIAGNOSIS — Z4789 Encounter for other orthopedic aftercare: Secondary | ICD-10-CM | POA: Diagnosis not present

## 2024-04-29 DIAGNOSIS — M25661 Stiffness of right knee, not elsewhere classified: Secondary | ICD-10-CM | POA: Diagnosis not present

## 2024-04-30 NOTE — Telephone Encounter (Signed)
 Patient is scheduled for appointment with [facility] for 05/10/24.

## 2024-05-04 DIAGNOSIS — Z4789 Encounter for other orthopedic aftercare: Secondary | ICD-10-CM | POA: Diagnosis not present

## 2024-05-04 DIAGNOSIS — M25561 Pain in right knee: Secondary | ICD-10-CM | POA: Diagnosis not present

## 2024-05-04 DIAGNOSIS — M25661 Stiffness of right knee, not elsewhere classified: Secondary | ICD-10-CM | POA: Diagnosis not present

## 2024-05-04 DIAGNOSIS — M6281 Muscle weakness (generalized): Secondary | ICD-10-CM | POA: Diagnosis not present

## 2024-05-05 DIAGNOSIS — C189 Malignant neoplasm of colon, unspecified: Secondary | ICD-10-CM | POA: Diagnosis not present

## 2024-05-05 DIAGNOSIS — F329 Major depressive disorder, single episode, unspecified: Secondary | ICD-10-CM | POA: Diagnosis not present

## 2024-05-05 DIAGNOSIS — D649 Anemia, unspecified: Secondary | ICD-10-CM | POA: Diagnosis not present

## 2024-05-06 DIAGNOSIS — M25661 Stiffness of right knee, not elsewhere classified: Secondary | ICD-10-CM | POA: Diagnosis not present

## 2024-05-06 DIAGNOSIS — M6281 Muscle weakness (generalized): Secondary | ICD-10-CM | POA: Diagnosis not present

## 2024-05-06 DIAGNOSIS — M25561 Pain in right knee: Secondary | ICD-10-CM | POA: Diagnosis not present

## 2024-05-06 DIAGNOSIS — Z4789 Encounter for other orthopedic aftercare: Secondary | ICD-10-CM | POA: Diagnosis not present

## 2024-05-10 ENCOUNTER — Ambulatory Visit: Payer: Self-pay | Admitting: Gastroenterology

## 2024-05-10 DIAGNOSIS — C186 Malignant neoplasm of descending colon: Secondary | ICD-10-CM | POA: Diagnosis not present

## 2024-05-10 NOTE — Progress Notes (Signed)
 Ms. Delellis,  Your CT did not show any evidence of cancer outside of the colon.  This is good news.   There was a very small lung nodule and a repeat CT scan was recommended for 1 year.  We will forward these results to your PCP to determine if repeating a CT is indicated.  Do you have an appointment scheduled with colorectal surgery?   Team,  Can you please forward the CT chest results to Dr. Rolinda for consideration of pulmonary nodule surveillance?  thanks

## 2024-05-31 ENCOUNTER — Ambulatory Visit

## 2024-05-31 VITALS — Ht 63.5 in | Wt 182.2 lb

## 2024-05-31 DIAGNOSIS — C187 Malignant neoplasm of sigmoid colon: Secondary | ICD-10-CM

## 2024-05-31 NOTE — Progress Notes (Signed)
 No egg or soy allergy known to patient  No issues known to pt with past sedation with any surgeries or procedures Patient denies ever being told they had issues or difficulty with intubation  No FH of Malignant Hyperthermia Pt is not on diet pills Pt is not on  home 02  Pt is not on blood thinners  Pt has issues with constipation and uses prune juice at night  No A fib or A flutter Have any cardiac testing pending--No Pt can ambulate  Pt denies use of chewing tobacco Discussed diabetic I weight loss medication holds Discussed NSAID holds Checked BMI Pt instructed to use Singlecare.com or GoodRx for a price reduction on prep  Patient's chart reviewed by Norleen Schillings CNRA prior to previsit and patient appropriate for the [facility].  Pre visit completed and red dot placed by patient's name on their procedure day (on provider's schedule).

## 2024-06-09 ENCOUNTER — Ambulatory Visit: Payer: Self-pay | Admitting: Surgery

## 2024-06-09 DIAGNOSIS — Z01818 Encounter for other preprocedural examination: Secondary | ICD-10-CM

## 2024-06-09 NOTE — Progress Notes (Signed)
 Sent message, via epic in basket, requesting orders in epic from Careers adviser.

## 2024-06-11 ENCOUNTER — Encounter: Payer: Self-pay | Admitting: Gastroenterology

## 2024-06-11 ENCOUNTER — Ambulatory Visit: Admitting: Gastroenterology

## 2024-06-11 VITALS — BP 125/72 | HR 63 | Temp 97.2°F | Resp 14 | Ht 63.5 in | Wt 182.2 lb

## 2024-06-11 DIAGNOSIS — K5730 Diverticulosis of large intestine without perforation or abscess without bleeding: Secondary | ICD-10-CM

## 2024-06-11 DIAGNOSIS — C187 Malignant neoplasm of sigmoid colon: Secondary | ICD-10-CM | POA: Diagnosis not present

## 2024-06-11 DIAGNOSIS — D128 Benign neoplasm of rectum: Secondary | ICD-10-CM | POA: Diagnosis not present

## 2024-06-11 DIAGNOSIS — D509 Iron deficiency anemia, unspecified: Secondary | ICD-10-CM

## 2024-06-11 DIAGNOSIS — L818 Other specified disorders of pigmentation: Secondary | ICD-10-CM | POA: Diagnosis not present

## 2024-06-11 MED ORDER — SODIUM CHLORIDE 0.9 % IV SOLN: 500.0000 mL | Freq: Once | INTRAVENOUS | Status: DC

## 2024-06-11 NOTE — Op Note (Signed)
 [facility] Patient Name: Laura Carter Procedure Date: 06/11/2024 1:14 PM MRN: [id number] Endoscopist: Glendia E. Stacia , MD, [id number] Age: 84 Referring MD:  Date of Birth: September 18, 1940 Gender: Female Account #: [id number] Procedure:                Flexible Sigmoidoscopy Indications:              Suspected adenomatous polyps in the rectum; known                            sigmoid colon cancer, scheduled for surgery later                            this month. Medicines:                Monitored Anesthesia Care Procedure:                Pre-Anesthesia Assessment:                           - Prior to the procedure, a History and Physical                            was performed, and patient medications and                            allergies were reviewed. The patient's tolerance of                            previous anesthesia was also reviewed. The risks                            and benefits of the procedure and the sedation                            options and risks were discussed with the patient.                            All questions were answered, and informed consent                            was obtained. Prior Anticoagulants: The patient has                            taken no anticoagulant or antiplatelet agents. ASA                            Grade Assessment: II - A patient with mild systemic                            disease. After reviewing the risks and benefits,                            the patient was deemed in satisfactory condition to  undergo the procedure.                           After obtaining informed consent, the scope was                            passed under direct vision. The Olympus scope                            [id number] was introduced through the anus and                            advanced to the the sigmoid colon. The flexible                            sigmoidoscopy was accomplished without  difficulty.                            The patient tolerated the procedure well. The                            quality of the bowel preparation was good. Scope In: 1:29:54 PM Scope Out: 1:45:11 PM Total Procedure Duration: 0 hours 15 minutes 17 seconds  Findings:                 The perianal and digital rectal examinations were                            normal. Pertinent negatives include normal                            sphincter tone and no palpable rectal lesions.                           An 18 mm polyp was found in the distal rectum. The                            polyp was sessile. The polyp was removed with a                            saline injection-lift technique using a hot snare.                            Following the initial snare resection, there                            appeared to be a small amount of polyp at the edge                            of the polypectomy margin. This was removed with a                            cold snare. Resection and retrieval were complete.  To prevent bleeding after the polypectomy, two                            hemostatic clips were successfully placed (MR                            conditional). There was no bleeding at the end of                            the procedure.                           A polypoid non-obstructing medium-sized mass was                            found in the sigmoid colon. The mass measured two                            cm in length. No bleeding was present.                           Two tattoos were seen in the sigmoid colon on the                            proximal and distal side of the sigmoid mass.                           Multiple medium-mouthed and small-mouthed                            diverticula were found in the sigmoid colon. Complications:            No immediate complications. Estimated Blood Loss:     Estimated blood loss was minimal. Impression:                - One 18 mm polyp in the distal rectum, removed                            using injection-lift and a hot snare. Resected and                            retrieved. Clips (MR conditional) were placed.                           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 This                            lesion appeared unchanged from November.                           - Two tattoos were seen in the sigmoid colon on the  proximal and distal sides of the sigmoid mass.                           - Diverticulosis in the sigmoid colon. Recommendation:           - Patient has a contact number available for                            emergencies. The signs and symptoms of potential                            delayed complications were discussed with the                            patient. Return to normal activities tomorrow.                            Written discharge instructions were provided to the                            patient.                           - Resume previous diet.                           - Recommend repeat colonoscopy in 1 year following                            surgery for sigmoid colon cancer cancer. Kindra Bickham E. Stacia, MD 06/11/2024 1:55:44 PM This report has been signed electronically.

## 2024-06-11 NOTE — Patient Instructions (Signed)
 YOU HAD AN ENDOSCOPIC PROCEDURE TODAY AT THE [facility]:   Refer to the procedure report that was given to you for any specific questions about what was found during the examination.  If the procedure report does not answer your questions, please call your gastroenterologist to clarify.  If you requested that your care partner not be given the details of your procedure findings, then the procedure report has been included in a sealed envelope for you to review at your convenience later.  YOU SHOULD EXPECT: Some feelings of bloating in the abdomen. Passage of more gas than usual.  Walking can help get rid of the air that was put into your GI tract during the procedure and reduce the bloating. If you had a lower endoscopy (such as a colonoscopy or flexible sigmoidoscopy) you may notice spotting of blood in your stool or on the toilet paper. If you underwent a bowel prep for your procedure, you may not have a normal bowel movement for a few days.  Please Note:  You might notice some irritation and congestion in your nose or some drainage.  This is from the oxygen  used during your procedure.  There is no need for concern and it should clear up in a day or so.  SYMPTOMS TO REPORT IMMEDIATELY:  Following lower endoscopy (colonoscopy or flexible sigmoidoscopy):  Excessive amounts of blood in the stool  Significant tenderness or worsening of abdominal pains  Swelling of the abdomen that is new, acute  Fever of 100F or higher  Resume previous diet Recommend repeat colonoscopy in 1 year following surgery for sigmoid colon cancer Return to normal activities tomorrow   For urgent or emergent issues, a gastroenterologist can be reached at any hour by calling [phone]. Do not use MyChart messaging for urgent concerns.    DIET:  We do recommend a small meal at first, but then you may proceed to your regular diet.  Drink plenty of fluids but you should avoid alcoholic beverages for 24  hours.  ACTIVITY:  You should plan to take it easy for the rest of today and you should NOT DRIVE or use heavy machinery until tomorrow (because of the sedation medicines used during the test).    FOLLOW UP: Our staff will call the number listed on your records the next business day following your procedure.  We will call around 7:15- 8:00 am to check on you and address any questions or concerns that you may have regarding the information given to you following your procedure. If we do not reach you, we will leave a message.     If any biopsies were taken you will be contacted by phone or by letter within the next 1-3 weeks.  Please call us  at [phone] if you have not heard about the biopsies in 3 weeks.    SIGNATURES/CONFIDENTIALITY: You and/or your care partner have signed paperwork which will be entered into your electronic medical record.  These signatures attest to the fact that that the information above on your After Visit Summary has been reviewed and is understood.  Full responsibility of the confidentiality of this discharge information lies with you and/or your care-partner.

## 2024-06-11 NOTE — Progress Notes (Signed)
 Called to room to assist during endoscopic procedure.  Patient ID and intended procedure confirmed with present staff. Received instructions for my participation in the procedure from the performing physician.

## 2024-06-11 NOTE — Progress Notes (Signed)
 Sedate, gd SR, tolerated procedure well, VSS, report to RN

## 2024-06-11 NOTE — Progress Notes (Signed)
 Pt's states no medical or surgical changes since previsit or office visit.

## 2024-06-11 NOTE — Progress Notes (Signed)
 [facility] History and Physical   Primary Care Physician:  Rolinda Millman, MD   Reason for Procedure:   Resection of known rectal polyp  Plan:    Flexible sigmoidoscopy with polypectomy   HPI: Laura Carter is a 84 y.o. female undergoing flexible sigmoidoscopy to remove a distal rectal polyp found on colonoscopy in November during a hospitalization for a diverticular bleed.  She was also noted to have a sigmoid lesion that biopsies showed to be colon cancer.  She is scheduled for surgery later this month.      The patient was provided an opportunity to ask questions and all were answered. 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 needle biopsy only   CHOLECYSTECTOMY  02/2009   COLONOSCOPY     COLONOSCOPY N/A 04/17/2024   Procedure: COLONOSCOPY;  Surgeon: Stacia Glendia BRAVO, MD;  Location: [facility];  Service: Gastroenterology;  Laterality: N/A;   ESOPHAGOGASTRODUODENOSCOPY     EYE SURGERY     bilateral   KNEE ARTHROSCOPY WITH MEDIAL MENISECTOMY Left 06/16/2015   Procedure: KNEE ARTHROSCOPY WITH PARTIAL MEDIAL MENISECTOMY;  Surgeon: Taft BRAVO Minerva, MD;  Location: [facility];  Service: Orthopedics;  Laterality: Left;   PARTIAL KNEE ARTHROPLASTY Left 07/08/2017   Procedure: UNICOMPARTMENTAL KNEE;  Surgeon: Beverley Evalene BIRCH, MD;  Location: [facility];  Service: Orthopedics;  Laterality: Left;   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  Authorizing Provider  acetaminophen  (TYLENOL ) 500 MG tablet Take 2 tablets (1,000 mg total) by mouth 2 (two) times daily as needed for mild pain (pain score 1-3) or moderate pain (pain score 4-6). 03/30/24  Yes Deward Eck, PA-C  ascorbic acid (VITAMIN C) 1000 MG tablet Take 1,000 mg by mouth daily.   Yes [provider]  b complex vitamins capsule Take 2 capsules by mouth at bedtime.   Yes [provider]  esomeprazole (NEXIUM) 20 MG capsule Take 20 mg by mouth at bedtime.   Yes [provider]  guaiFENesin (MUCINEX) 600 MG 12 hr tablet Take 1 tablet by mouth 2 (two) times daily.   Yes [provider]  Multiple Vitamins-Minerals (ZINC PO) Take 1 capsule by mouth in the morning and at bedtime.   Yes [provider]  NON FORMULARY Pt uses a cpap nightly   Yes [provider]  Vitamin D, Ergocalciferol, (DRISDOL) 1.25 MG (50000 UNIT) CAPS capsule Take 50,000 Units by mouth every 7 (seven) days.   Yes [provider]  albuterol  (VENTOLIN  HFA) 108 (90 Base) MCG/ACT inhaler Inhale 2 puffs into the lungs every 6 (six) hours as needed. 08/07/23   Leath-Warren, Etta PARAS, NP  APPLE CIDER VINEGAR PO Take 30 mLs by mouth daily. Patient not taking: Reported on 06/11/2024    [provider]  aspirin  EC 81 MG tablet Take 1 tablet (81 mg  total) by mouth 2 (two) times daily. To prevent blood clots for 30 days after surgery. Patient not taking: Reported on 05/31/2024 03/30/24   Deward Eck, PA-C  azelastine  (ASTELIN ) 0.1 % nasal spray Place 1 spray into both nostrils 2 (two) times daily. Use in each nostril as directed Patient not taking: Reported on 06/11/2024 01/12/24   Stuart Vernell Norris, PA-C  bisacodyl (DULCOLAX) 5 MG EC tablet Take 5 mg by mouth as directed. Patient not taking: Reported on 06/11/2024 05/10/24   [provider]  fluticasone  (FLONASE ) 50 MCG/ACT nasal spray Place 2 sprays into both nostrils daily. Patient not taking:  No sig reported    [provider]  fluticasone -salmeterol (ADVAIR DISKUS) 100-50 MCG/ACT AEPB Inhale 1 puff into the lungs in the morning and at bedtime. Patient not taking: No sig reported    [provider]  [Paused] metoprolol  succinate (TOPROL -XL) 25 MG 24 hr tablet TAKE (1) TABLET BY MOUTH ONCE DAILY. Patient not taking: No sig reported Wait to take this until your doctor or other care provider tells you to start again. 06/30/17   Duanne Butler DASEN, MD  metroNIDAZOLE (FLAGYL) 500 MG tablet Take 500 mg by mouth as directed. Patient not taking: Reported on 06/11/2024 05/10/24 06/21/24  [provider]  neomycin (MYCIFRADIN) 500 MG tablet Take 500 mg by mouth as directed. Patient not taking: Reported on 06/11/2024 05/10/24   [provider]  polyethylene glycol powder (GLYCOLAX /MIRALAX ) 17 GM/SCOOP powder Take 238 g by mouth as directed. Patient not taking: Reported on 06/11/2024 05/10/24   [provider]    Current Outpatient Medications  Medication Sig Dispense Refill   acetaminophen  (TYLENOL ) 500 MG tablet Take 2 tablets (1,000 mg total) by mouth 2 (two) times daily as needed for mild pain (pain score 1-3) or moderate pain (pain score 4-6). 30 tablet 0   ascorbic acid (VITAMIN C) 1000 MG tablet Take 1,000 mg by mouth daily.     b complex vitamins capsule Take 2 capsules by mouth at bedtime.     esomeprazole (NEXIUM) 20 MG capsule Take 20 mg by mouth at bedtime.     guaiFENesin (MUCINEX) 600 MG 12 hr tablet Take 1 tablet by mouth 2 (two) times daily.     Multiple Vitamins-Minerals (ZINC PO) Take 1 capsule by mouth in the morning and at bedtime.     NON FORMULARY Pt uses a cpap nightly     Vitamin D, Ergocalciferol, (DRISDOL) 1.25 MG (50000 UNIT) CAPS capsule Take 50,000 Units by mouth every 7 (seven) days.     albuterol  (VENTOLIN  HFA) 108 (90 Base) MCG/ACT inhaler Inhale 2 puffs into the lungs every 6 (six) hours as needed. 8 g 0   APPLE CIDER  VINEGAR PO Take 30 mLs by mouth daily. (Patient not taking: Reported on 06/11/2024)     aspirin  EC 81 MG tablet Take 1 tablet (81 mg total) by mouth 2 (two) times daily. To prevent blood clots for 30 days after surgery. (Patient not taking: Reported on 05/31/2024) 60 tablet 0   azelastine  (ASTELIN ) 0.1 % nasal spray Place 1 spray into both nostrils 2 (two) times daily. Use in each nostril as directed (Patient not taking: Reported on 06/11/2024) 30 mL 0   bisacodyl (DULCOLAX) 5 MG EC tablet Take 5 mg by mouth as directed. (Patient not taking: Reported on 06/11/2024)     fluticasone  (FLONASE ) 50 MCG/ACT nasal spray Place 2 sprays into both nostrils daily. (Patient not taking: No sig reported)  fluticasone -salmeterol (ADVAIR DISKUS) 100-50 MCG/ACT AEPB Inhale 1 puff into the lungs in the morning and at bedtime. (Patient not taking: No sig reported)     [Paused] metoprolol  succinate (TOPROL -XL) 25 MG 24 hr tablet TAKE (1) TABLET BY MOUTH ONCE DAILY. (Patient not taking: No sig reported) 90 tablet 3   metroNIDAZOLE (FLAGYL) 500 MG tablet Take 500 mg by mouth as directed. (Patient not taking: Reported on 06/11/2024)     neomycin (MYCIFRADIN) 500 MG tablet Take 500 mg by mouth as directed. (Patient not taking: Reported on 06/11/2024)     polyethylene glycol powder (GLYCOLAX /MIRALAX ) 17 GM/SCOOP powder Take 238 g by mouth as directed. (Patient not taking: Reported on 06/11/2024)     Current Facility-Administered Medications  Medication Dose Route Frequency Provider Last Rate Last Admin   0.9 %  sodium chloride  infusion  500 mL Intravenous Once Stacia Glendia BRAVO, MD        Allergies as of 06/11/2024   (No Known Allergies)    Family History  Problem Relation Age of Onset   Cancer Mother        breast   Stomach cancer Mother    Breast cancer Mother    Heart disease Father    Heart disease Sister    Breast cancer Sister    Colon cancer Neg Hx    Rectal cancer Neg Hx    Esophageal cancer Neg Hx      Social History   Socioeconomic History   Marital status: Married    Spouse name: Not on file   Number of children: 2   Years of education: Not on file   Highest education level: 10th grade  Occupational History   Not on file  Tobacco Use   Smoking status: Former    Current packs/day: 0.00    Average packs/day: 1 pack/day for 10.0 years (10.0 ttl pk-yrs)    Types: Cigarettes    Start date: 06/14/1955    Quit date: 06/13/1965    Years since quitting: 59.0   Smokeless tobacco: Never  Vaping Use   Vaping status: Never Used  Substance and Sexual Activity   Alcohol use: No   Drug use: No   Sexual activity: Never    Birth control/protection: None  Other Topics Concern   Not on file  Social History Narrative   Grew up in [place], [place]. Married. Has one daughter and one son. Has two grandchildren.    Did not work outside the home.   Eats all food groups.    Wear seatbelt.    Went to [employer] and graduated from [employer] in [place].    Social Drivers of Health   Tobacco Use: Medium Risk (06/11/2024)   Patient History    Smoking Tobacco Use: Former    Smokeless Tobacco Use: Never    Passive Exposure: Not on Actuary Strain: Not on file  Food Insecurity: No Food Insecurity (04/15/2024)   Epic    Worried About Programme Researcher, Broadcasting/film/video in the Last Year: Never true    Ran Out of Food in the Last Year: Never true  Transportation Needs: No Transportation Needs (04/15/2024)   Epic    Lack of Transportation (Medical): No    Lack of Transportation (Non-Medical): No  Physical Activity: Not on file  Stress: Not on file  Social Connections: Socially Integrated (04/15/2024)   Social Connection and Isolation Panel    Frequency of Communication with Friends and Family: More than three times a  week    Frequency of Social Gatherings with Friends and Family: More than three times a week    Attends Religious Services: 1 to 4 times per year    Active Member of  [employer] or Organizations: Yes    Attends Banker Meetings: 1 to 4 times per year    Marital Status: Married  Catering Manager Violence: Not At Risk (04/15/2024)   Epic    Fear of Current or Ex-Partner: No    Emotionally Abused: No    Physically Abused: No    Sexually Abused: No  Depression (PHQ2-9): Low Risk (11/05/2021)   Depression (PHQ2-9)    PHQ-2 Score: 0  Alcohol Screen: Not on file  Housing: Unknown (05/10/2024)   Received from [facility]   Epic    Unable to Pay for Housing in the Last Year: Not on file    Number of Times Moved in the Last Year: Not on file    At any time in the past 12 months, were you homeless or living in a shelter (including now)?: No  Utilities: Not At Risk (04/15/2024)   Epic    Threatened with loss of utilities: No  Health Literacy: Not on file    Review of Systems:  All other review of systems negative except as mentioned in the HPI.  Physical Exam: Vital signs BP (!) 168/93   Pulse 76   Temp (!) 97.2 F (36.2 C) (Skin)   Resp 16   Ht 5' 3.5 (1.613 m)   Wt 182 lb 3.2 oz (82.6 kg)   SpO2 (!) 85%   BMI 31.77 kg/m   General:   Alert,  Well-developed, well-nourished, pleasant and cooperative in NAD Airway:  Mallampati 2 Lungs:  Clear throughout to auscultation.   Heart:  Regular rate and rhythm; no murmurs, clicks, rubs,  or gallops. Abdomen:  Soft, nontender and nondistended. Normal bowel sounds.   Neuro/Psych:  Normal mood and affect. A and O x 3   Cheryal Salas E. Stacia, MD [facility]

## 2024-06-14 ENCOUNTER — Telehealth: Payer: Self-pay | Admitting: *Deleted

## 2024-06-14 NOTE — Patient Instructions (Signed)
 SURGICAL WAITING ROOM VISITATION  Patients having surgery or a procedure may have no more than 2 support people in the waiting area - these visitors may rotate.    Children ages 7 and under will not be able to visit patients in [facility] under most circumstances.   Visitors with respiratory illnesses are discouraged from visiting and should remain at home.  If the patient needs to stay at the hospital during part of their recovery, the visitor guidelines for inpatient rooms apply. Pre-op nurse will coordinate an appropriate time for 1 support person to accompany patient in pre-op.  This support person may not rotate.    Please refer to the [facility] website for the visitor guidelines for Inpatients (after your surgery is over and you are in a regular room).       Your procedure is scheduled on:   06/23/24    Report to [facility] Main Entrance    Report to admitting at   1145AM   Call this number if you have problems the morning of surgery [phone]           Clear liquid diet on day of bowel prepl            Follow bowel prep isntructons per MD.    Mix 238 g of Miralax  with 64 ounces of Gatorade.  No red.  Drinkk 8 ounces every 15-30 minutes until completed day before surgery.     After Midnight you may have the following liquids until _ 1045_____ AM  DAY OF SURGERY  Water  Non-Citrus Juices (without pulp, NO RED-Apple, White grape, White cranberry) Black Coffee (NO MILK/CREAM OR CREAMERS, sugar ok)  Clear Tea (NO MILK/CREAM OR CREAMERS, sugar ok) regular and decaf                             Plain Jell-O (NO RED)                                           Fruit ices (not with fruit pulp, NO RED)                                     Popsicles (NO RED)                                                               Sports drinks like Gatorade (NO RED)              Drink 2 Ensure/G2 drinks AT 10:00 PM the night before surgery.        The day of surgery:   Drink ONE (1) Pre-Surgery Clear Ensure or G2 at   1045AM the morning of surgery. Drink in one sitting. Do not sip.  This drink was given to you during your hospital  pre-op appointment visit. Nothing else to drink after completing the  Pre-Surgery Clear Ensure or G2.          If you have questions, please contact your surgeons office.   FOLLOW BOWEL PREP AND  ANY ADDITIONAL PRE OP INSTRUCTIONS YOU RECEIVED FROM YOUR SURGEON'S OFFICE!!!     Oral Hygiene is also important to reduce your risk of infection.                                    Remember - BRUSH YOUR TEETH THE MORNING OF SURGERY WITH YOUR REGULAR TOOTHPASTE  DENTURES WILL BE REMOVED PRIOR TO SURGERY PLEASE DO NOT APPLY Poly grip OR ADHESIVES!!!   Do NOT smoke after Midnight   Stop all vitamins and herbal supplements 7 days before surgery.   Take these medicines the morning of surgery with A SIP OF WATER :  none   DO NOT TAKE ANY ORAL DIABETIC MEDICATIONS DAY OF YOUR SURGERY  Bring CPAP mask and tubing day of surgery.                              You may not have any metal on your body including hair pins, jewelry, and body piercing             Do not wear make-up, lotions, powders, perfumes/cologne, or deodorant  Do not wear nail polish including gel and S&S, artificial/acrylic nails, or any other type of covering on natural nails including finger and toenails. If you have artificial nails, gel coating, etc. that needs to be removed by a nail salon please have this removed prior to surgery or surgery may need to be canceled/ delayed if the surgeon/ anesthesia feels like they are unable to be safely monitored.   Do not shave  48 hours prior to surgery.               Men may shave face and neck.   Do not bring valuables to the hospital. [facility] IS NOT             RESPONSIBLE   FOR VALUABLES.   Contacts, glasses, dentures or bridgework may not be worn into surgery.   Bring small overnight bag day of surgery.    DO NOT BRING YOUR HOME MEDICATIONS TO THE HOSPITAL. PHARMACY WILL DISPENSE MEDICATIONS LISTED ON YOUR MEDICATION LIST TO YOU DURING YOUR ADMISSION IN THE HOSPITAL!    Patients discharged on the day of surgery will not be allowed to drive home.  Someone NEEDS to stay with you for the first 24 hours after anesthesia.   Special Instructions: Bring a copy of your healthcare power of attorney and living will documents the day of surgery if you haven't scanned them before.              Please read over the following fact sheets you were given: IF YOU HAVE QUESTIONS ABOUT YOUR PRE-OP INSTRUCTIONS PLEASE CALL [phone].   If you received a COVID test during your pre-op visit  it is requested that you wear a mask when out in public, stay away from anyone that may not be feeling well and notify your surgeon if you develop symptoms. If you test positive for Covid or have been in contact with anyone that has tested positive in the last 10 days please notify you surgeon.    [place] - Preparing for Surgery Before surgery, you can play an important role.  Because skin is not sterile, your skin needs to be as free of germs as possible.  You can reduce the number of germs on your  skin by washing with CHG (chlorahexidine gluconate) soap before surgery.  CHG is an antiseptic cleaner which kills germs and bonds with the skin to continue killing germs even after washing. Please DO NOT use if you have an allergy to CHG or antibacterial soaps.  If your skin becomes reddened/irritated stop using the CHG and inform your nurse when you arrive at Short Stay. Do not shave (including legs and underarms) for at least 48 hours prior to the first CHG shower.  You may shave your face/neck.  Please follow these instructions carefully:  1.  Shower with CHG Soap the night before surgery ONLY (DO NOT USE THE SOAP THE MORNING OF SURGERY).  2.  If you choose to wash your hair, wash your hair first as usual with your normal   shampoo.  3.  After you shampoo, rinse your hair and body thoroughly to remove the shampoo.                             4.  Use CHG as you would any other liquid soap.  You can apply chg directly to the skin and wash.  Gently with a scrungie or clean washcloth.  5.  Apply the CHG Soap to your body ONLY FROM THE NECK DOWN.   Do   not use on face/ open                           Wound or open sores. Avoid contact with eyes, ears mouth and   genitals (private parts).                       Wash face,  Genitals (private parts) with your normal soap.             6.  Wash thoroughly, paying special attention to the area where your    surgery  will be performed.  7.  Thoroughly rinse your body with warm water  from the neck down.  8.  DO NOT shower/wash with your normal soap after using and rinsing off the CHG Soap.                9.  Pat yourself dry with a clean towel.            10.  Wear clean pajamas.            11.  Place clean sheets on your bed the night of your first shower and do not  sleep with pets. Day of Surgery : Do not apply any CHG, lotions/deodorants the morning of surgery.  Please wear clean clothes to the hospital/surgery center.  FAILURE TO FOLLOW THESE INSTRUCTIONS MAY RESULT IN THE CANCELLATION OF YOUR SURGERY  PATIENT SIGNATURE_________________________________  NURSE SIGNATURE__________________________________  ________________________________________________________________________

## 2024-06-14 NOTE — Progress Notes (Addendum)
 Anesthesia Review:  PCP:  Vernell Fort  Cardiologist : none  04/26/24 - LOV with [place] Necessary  PPM/ ICD: Device Orders: Rep Notified:  Chest x-ray : CT Chest- 05/05/24  EKG :  04/16/24  Echo : Stress test: Cardiac Cath :   Activity level: cannot do a flight of stairs without difficutly  Sleep Study/ CPAP : has cpap  Fasting Blood Sugar :      / Checks Blood Sugar -- times a day:    Blood Thinner/ Instructions /Last Dose: ASA / Instructions/ Last Dose :      Preop appt on 06/16/24.  AT time preop appt .  Research saw pt.  PT did not have any bowel prep instructions from [facility].  PT did have prescriptions from drugstore but did not know what to do with those.  Preop nurse called [facility] and spoke with Triage.  PT to go by [facility] on way home today and will pick up Bowel prep sheet with all bowel prep instructions and what to do with antibiotics and Dulcolax.  PT and husband both voiced understanding.  PT wa 20 minutes late for preop appt.  Spoke with pt o 06/21/24 and pt did not have any more questions.  Voiced understanding of bowel prep.   03/30/24- right knee arthroplasty  04/14/24- IN ED with GI bleed .  Received 3 units of blood  04/17/24- Colonoscopy    Called pt on 06/17/24 at 1630pm and LVMM .  Wanted to make sure pt had received bowel prep instructions from [facility].    PT called back and LVMM for preop nurse stating she had received bowel prep instructons from [facility] .  PT states she did have a question in regards to Iron pill she is taking  Attempted to call pt on 06/21/26 and there was no answer.  Will attempt to call later in pm on 06/21/24.

## 2024-06-14 NOTE — Telephone Encounter (Signed)
" °  Follow up Call-     06/11/2024   12:38 PM  Call back number  Post procedure Call Back phone  # [phone]  Permission to leave phone message Yes     Patient questions:  Do you have a fever, pain , or abdominal swelling? No. Pain Score  0 *  Have you tolerated food without any problems? Yes.    Have you been able to return to your normal activities? Yes.    Do you have any questions about your discharge instructions: Diet   No. Medications  No. Follow up visit  No.  Do you have questions or concerns about your Care? No.  Actions: * If pain score is 4 or above: No action needed, pain <4.   "

## 2024-06-16 ENCOUNTER — Other Ambulatory Visit: Payer: Self-pay

## 2024-06-16 ENCOUNTER — Encounter (HOSPITAL_COMMUNITY)
Admission: RE | Admit: 2024-06-16 | Discharge: 2024-06-16 | Disposition: A | Discharge status: Home / Self Care | Source: Ambulatory Visit | Attending: Surgery | Admitting: Surgery

## 2024-06-16 ENCOUNTER — Encounter (HOSPITAL_COMMUNITY): Payer: Self-pay

## 2024-06-16 DIAGNOSIS — Z01812 Encounter for preprocedural laboratory examination: Secondary | ICD-10-CM | POA: Insufficient documentation

## 2024-06-16 DIAGNOSIS — I10 Essential (primary) hypertension: Secondary | ICD-10-CM | POA: Insufficient documentation

## 2024-06-16 DIAGNOSIS — Z87891 Personal history of nicotine dependence: Secondary | ICD-10-CM | POA: Diagnosis not present

## 2024-06-16 DIAGNOSIS — K449 Diaphragmatic hernia without obstruction or gangrene: Secondary | ICD-10-CM | POA: Diagnosis not present

## 2024-06-16 DIAGNOSIS — G4733 Obstructive sleep apnea (adult) (pediatric): Secondary | ICD-10-CM | POA: Diagnosis not present

## 2024-06-16 DIAGNOSIS — Z96651 Presence of right artificial knee joint: Secondary | ICD-10-CM | POA: Diagnosis not present

## 2024-06-16 DIAGNOSIS — Z01818 Encounter for other preprocedural examination: Secondary | ICD-10-CM

## 2024-06-16 DIAGNOSIS — I2510 Atherosclerotic heart disease of native coronary artery without angina pectoris: Secondary | ICD-10-CM | POA: Diagnosis not present

## 2024-06-16 DIAGNOSIS — K219 Gastro-esophageal reflux disease without esophagitis: Secondary | ICD-10-CM | POA: Insufficient documentation

## 2024-06-16 DIAGNOSIS — J4520 Mild intermittent asthma, uncomplicated: Secondary | ICD-10-CM | POA: Insufficient documentation

## 2024-06-16 DIAGNOSIS — M48061 Spinal stenosis, lumbar region without neurogenic claudication: Secondary | ICD-10-CM | POA: Insufficient documentation

## 2024-06-16 DIAGNOSIS — C187 Malignant neoplasm of sigmoid colon: Secondary | ICD-10-CM | POA: Insufficient documentation

## 2024-06-16 HISTORY — DX: Other complications of anesthesia, initial encounter: T88.59XA

## 2024-06-16 LAB — CBC WITH DIFFERENTIAL/PLATELET
Abs Immature Granulocytes: 0.03 K/uL (ref 0.00–0.07)
Basophils Absolute: 0.1 K/uL (ref 0.0–0.1)
Basophils Relative: 1 %
Eosinophils Absolute: 0.1 K/uL (ref 0.0–0.5)
Eosinophils Relative: 2 %
HCT: 40.2 % (ref 36.0–46.0)
Hemoglobin: 12.3 g/dL (ref 12.0–15.0)
Immature Granulocytes: 1 %
Lymphocytes Relative: 20 %
Lymphs Abs: 1.2 K/uL (ref 0.7–4.0)
MCH: 26.9 pg (ref 26.0–34.0)
MCHC: 30.6 g/dL (ref 30.0–36.0)
MCV: 88 fL (ref 80.0–100.0)
Monocytes Absolute: 0.5 K/uL (ref 0.1–1.0)
Monocytes Relative: 9 %
Neutro Abs: 4 K/uL (ref 1.7–7.7)
Neutrophils Relative %: 67 %
Platelets: 336 K/uL (ref 150–400)
RBC: 4.57 MIL/uL (ref 3.87–5.11)
RDW: 15.9 % — ABNORMAL HIGH (ref 11.5–15.5)
WBC: 5.9 K/uL (ref 4.0–10.5)
nRBC: 0 % (ref 0.0–0.2)

## 2024-06-16 LAB — COMPREHENSIVE METABOLIC PANEL WITH GFR
ALT: 15 U/L (ref 0–44)
AST: 21 U/L (ref 15–41)
Albumin: 4.2 g/dL (ref 3.5–5.0)
Alkaline Phosphatase: 91 U/L (ref 38–126)
Anion gap: 13 (ref 5–15)
BUN: 17 mg/dL (ref 8–23)
CO2: 20 mmol/L — ABNORMAL LOW (ref 22–32)
Calcium: 9.8 mg/dL (ref 8.9–10.3)
Chloride: 107 mmol/L (ref 98–111)
Creatinine, Ser: 0.66 mg/dL (ref 0.44–1.00)
GFR, Estimated: >60 mL/min
Glucose, Bld: 109 mg/dL — ABNORMAL HIGH (ref 70–99)
Potassium: 3.7 mmol/L (ref 3.5–5.1)
Sodium: 140 mmol/L (ref 135–145)
Total Bilirubin: 0.4 mg/dL (ref 0.0–1.2)
Total Protein: 7.3 g/dL (ref 6.5–8.1)

## 2024-06-16 LAB — SURGICAL PATHOLOGY

## 2024-06-17 NOTE — Progress Notes (Signed)
 " Case: [id number] Date/Time: 06/23/24 1338   Procedures:      COLECTOMY, SIGMOID, ROBOT-ASSISTED     RESECTION, RECTUM, LOW ANTERIOR, ROBOT-ASSISTED     SIGMOIDOSCOPY, FLEXIBLE - FLEXIBLE SIGMOIDOSCOPY, INTRAOPERATIVE PERFUSION ASSESSMENT ICG   Anesthesia type: General   Pre-op diagnosis: COLON CANCER SIGMOID   Location: [facility] ROOM 02 / [facility]   Surgeons: Teresa Lonni HERO, MD       DISCUSSION: Laura Carter is an 84 yo female with PMH of former smoking, HTN, CAD (by CT), OSA (uses CPAP), mild intermittent asthma, GERD, large hiatal hernia, lumbar spinal stenosis, arthritis, and recent diagnosis of colon cancer.   Pt underwent R TKA on 03/30/24 under spinal anesthesia. No complications noted.  Patient admitted from 11/19-11/23/25 for GIB. Underwent a colonoscopy on 11/22 and 2 large polyps noted and pathology positive for cancer. Now scheduled for surgery above.  She had anemia with her last surgery and after GIB. She received 2 units while hospitalized in November. Pre op CBC is normal.  VS: BP (!) 150/79   Pulse 75   Temp 36.7 C (Oral)   Resp 16   Ht 5' 3.5 (1.613 m)   Wt 80.3 kg   SpO2 97%   BMI 30.86 kg/m   PROVIDERS: Rolinda Millman, MD   LABS: Labs reviewed: Acceptable for surgery. (all labs ordered are listed, but only abnormal results are displayed)  Labs Reviewed  CBC WITH DIFFERENTIAL/PLATELET - Abnormal; Notable for the following components:      Result Value   RDW 15.9 (*)    All other components within normal limits  COMPREHENSIVE METABOLIC PANEL WITH GFR - Abnormal; Notable for the following components:   CO2 20 (*)    Glucose, Bld 109 (*)    All other components within normal limits     CT Chest 04/28/24:  IMPRESSION: 1. No evidence of metastatic disease. 2. 3 mm left lower lobe pulmonary nodule, minimally increased from 2 mm, indeterminate; recommend follow-up per Fleischner Society Guidelines based on risk, or short-interval chest CT in 12 months if  clinically warranted given cancer staging context. 3. Large hiatal hernia. 4. Coronary artery disease. 5. Aortic aclerosis.    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 needle biopsy only   CHOLECYSTECTOMY  02/2009   COLONOSCOPY     COLONOSCOPY N/A 04/17/2024   Procedure: COLONOSCOPY;  Surgeon: Stacia Glendia BRAVO, MD;  Location: [facility];  Service: Gastroenterology;  Laterality: N/A;   ESOPHAGOGASTRODUODENOSCOPY     EYE SURGERY     bilateral   KNEE ARTHROSCOPY WITH MEDIAL MENISECTOMY Left 06/16/2015   Procedure: KNEE ARTHROSCOPY WITH PARTIAL MEDIAL MENISECTOMY;  Surgeon: Taft BRAVO Minerva, MD;  Location: [facility];  Service: Orthopedics;  Laterality: Left;   PARTIAL KNEE ARTHROPLASTY Left 07/08/2017   Procedure: UNICOMPARTMENTAL KNEE;  Surgeon: Beverley Evalene BIRCH, MD;  Location: [facility] OR;  Service: Orthopedics;  Laterality: Left;   TOTAL KNEE ARTHROPLASTY Right 03/30/2024   Procedure:  ARTHROPLASTY, KNEE, TOTAL;  Surgeon: Beverley Evalene BIRCH, MD;  Location: [facility];  Service: Orthopedics;  Laterality: Right;    MEDICATIONS:  acetaminophen  (TYLENOL ) 500 MG tablet   albuterol  (VENTOLIN  HFA) 108 (90 Base) MCG/ACT inhaler   ascorbic acid (VITAMIN C) 1000 MG tablet   b complex vitamins capsule   bisacodyl (DULCOLAX) 5 MG EC tablet   ECHINACEA EXTRACT PO   esomeprazole (NEXIUM) 20 MG capsule   ferrous sulfate  325 (65 FE) MG tablet   fluticasone  (FLONASE ) 50 MCG/ACT nasal spray   [Paused] metoprolol  succinate (TOPROL -XL) 25 MG 24 hr tablet   metroNIDAZOLE (FLAGYL) 500 MG tablet   neomycin  (MYCIFRADIN) 500 MG tablet   NON FORMULARY   OVER THE COUNTER MEDICATION   polyethylene glycol (MIRALAX  / GLYCOLAX ) 17 g packet   Sodium Chloride -Xylitol (XLEAR SINUS CARE SPRAY NA)   Turmeric (QC TUMERIC COMPLEX PO)   VITAMIN D PO   zinc gluconate 50 MG tablet   No current facility-administered medications for this encounter.    Burnard CHRISTELLA Odis DEVONNA [facility] Short Stay/Anesthesiology [facility] Phone [phone] 06/17/2024 12:03 PM       "

## 2024-06-17 NOTE — Anesthesia Preprocedure Evaluation (Addendum)
"                                    Anesthesia Evaluation  Patient identified by MRN, date of birth, ID band Patient awake    Reviewed: Allergy & Precautions, H&P , NPO status , Patient's Chart, lab work & pertinent test results  History of Anesthesia Complications Negative for: history of anesthetic complications  Airway Mallampati: II  TM Distance: >3 FB Neck ROM: Full    Dental no notable dental hx.    Pulmonary asthma , sleep apnea , former smoker   Pulmonary exam normal breath sounds clear to auscultation       Cardiovascular hypertension, (-) angina (-) Past MI Normal cardiovascular exam Rhythm:Regular Rate:Normal     Neuro/Psych neg Seizures  negative psych ROS   GI/Hepatic Neg liver ROS, hiatal hernia,GERD  ,,Colon cancer   Endo/Other  negative endocrine ROS    Renal/GU negative Renal ROS  negative genitourinary   Musculoskeletal negative musculoskeletal ROS (+)    Abdominal   Peds negative pediatric ROS (+)  Hematology negative hematology ROS (+)   Anesthesia Other Findings   Reproductive/Obstetrics negative OB ROS                              Anesthesia Physical Anesthesia Plan  ASA: 3  Anesthesia Plan: General   Post-op Pain Management: Ofirmev  IV (intra-op)*   Induction: Intravenous  PONV Risk Score and Plan: 3 and Ondansetron , Dexamethasone  and Treatment may vary due to age or medical condition  Airway Management Planned: Oral ETT  Additional Equipment: None  Intra-op Plan:   Post-operative Plan: Extubation in OR  Informed Consent: I have reviewed the patients History and Physical, chart, labs and discussed the procedure including the risks, benefits and alternatives for the proposed anesthesia with the patient or authorized representative who has indicated his/her understanding and acceptance.     Dental advisory given  Plan Discussed with: CRNA  Anesthesia Plan Comments: (See PAT note  from 1/21  Laura Carter is an 84 yo female with PMH of former smoking, HTN, CAD (by CT), OSA (uses CPAP), mild intermittent asthma, GERD, large hiatal hernia, lumbar spinal stenosis, arthritis, and recent diagnosis of colon cancer.    Pt underwent R TKA on 03/30/24 under spinal anesthesia. No complications noted.   Patient admitted from 11/19-11/23/25 for GIB. Underwent a colonoscopy on 11/22 and 2 large polyps noted and pathology positive for cancer. Now scheduled for surgery above.   She had anemia with her last surgery and after GIB. She received 2 units while hospitalized in November. Pre op CBC is normal. )         Anesthesia Quick Evaluation  "

## 2024-06-18 ENCOUNTER — Ambulatory Visit: Payer: Self-pay | Admitting: Gastroenterology

## 2024-06-18 NOTE — Progress Notes (Signed)
 Ms. Laura Carter,  The polyp that I removed during your recent procedure was completely benign but was proven to be a pre-cancerous polyp that MAY have grown into cancers if it had not been removed.    Based on current nationally recognized surveillance guidelines, I recommend that you have a repeat colonoscopy in 1 year following your upcoming surgery for colon cancer.

## 2024-06-23 ENCOUNTER — Inpatient Hospital Stay (HOSPITAL_COMMUNITY)
Admission: RE | Admit: 2024-06-23 | Discharge: 2024-06-25 | DRG: 331 | Disposition: A | Discharge status: Home / Self Care | Attending: Surgery | Admitting: Surgery

## 2024-06-23 ENCOUNTER — Encounter (HOSPITAL_COMMUNITY)
Admission: RE | Disposition: A | Discharge status: Home / Self Care | Payer: Self-pay | Source: Home / Self Care | Attending: Surgery

## 2024-06-23 ENCOUNTER — Other Ambulatory Visit: Payer: Self-pay

## 2024-06-23 ENCOUNTER — Inpatient Hospital Stay (HOSPITAL_COMMUNITY): Admitting: Anesthesiology

## 2024-06-23 ENCOUNTER — Inpatient Hospital Stay (HOSPITAL_COMMUNITY): Payer: Self-pay | Admitting: Medical

## 2024-06-23 ENCOUNTER — Encounter (HOSPITAL_COMMUNITY): Payer: Self-pay | Admitting: Surgery

## 2024-06-23 DIAGNOSIS — I10 Essential (primary) hypertension: Secondary | ICD-10-CM | POA: Diagnosis not present

## 2024-06-23 DIAGNOSIS — G4730 Sleep apnea, unspecified: Secondary | ICD-10-CM | POA: Diagnosis present

## 2024-06-23 DIAGNOSIS — Z85828 Personal history of other malignant neoplasm of skin: Secondary | ICD-10-CM

## 2024-06-23 DIAGNOSIS — E66811 Obesity, class 1: Secondary | ICD-10-CM | POA: Diagnosis present

## 2024-06-23 DIAGNOSIS — I2510 Atherosclerotic heart disease of native coronary artery without angina pectoris: Secondary | ICD-10-CM | POA: Diagnosis present

## 2024-06-23 DIAGNOSIS — D126 Benign neoplasm of colon, unspecified: Secondary | ICD-10-CM | POA: Diagnosis present

## 2024-06-23 DIAGNOSIS — J45909 Unspecified asthma, uncomplicated: Secondary | ICD-10-CM | POA: Diagnosis not present

## 2024-06-23 DIAGNOSIS — K449 Diaphragmatic hernia without obstruction or gangrene: Secondary | ICD-10-CM | POA: Diagnosis present

## 2024-06-23 DIAGNOSIS — Z87891 Personal history of nicotine dependence: Secondary | ICD-10-CM

## 2024-06-23 DIAGNOSIS — K5730 Diverticulosis of large intestine without perforation or abscess without bleeding: Secondary | ICD-10-CM | POA: Diagnosis present

## 2024-06-23 DIAGNOSIS — I700 Atherosclerosis of aorta: Secondary | ICD-10-CM | POA: Diagnosis present

## 2024-06-23 DIAGNOSIS — Z01818 Encounter for other preprocedural examination: Secondary | ICD-10-CM

## 2024-06-23 DIAGNOSIS — Z860101 Personal history of adenomatous and serrated colon polyps: Secondary | ICD-10-CM

## 2024-06-23 DIAGNOSIS — C187 Malignant neoplasm of sigmoid colon: Secondary | ICD-10-CM | POA: Diagnosis not present

## 2024-06-23 DIAGNOSIS — K219 Gastro-esophageal reflux disease without esophagitis: Secondary | ICD-10-CM | POA: Diagnosis present

## 2024-06-23 DIAGNOSIS — Z8249 Family history of ischemic heart disease and other diseases of the circulatory system: Secondary | ICD-10-CM

## 2024-06-23 DIAGNOSIS — Z888 Allergy status to other drugs, medicaments and biological substances status: Secondary | ICD-10-CM

## 2024-06-23 DIAGNOSIS — Z96653 Presence of artificial knee joint, bilateral: Secondary | ICD-10-CM | POA: Diagnosis present

## 2024-06-23 DIAGNOSIS — Z803 Family history of malignant neoplasm of breast: Secondary | ICD-10-CM

## 2024-06-23 DIAGNOSIS — Z800 Family history of malignant neoplasm of digestive organs: Secondary | ICD-10-CM

## 2024-06-23 DIAGNOSIS — Z6832 Body mass index (BMI) 32.0-32.9, adult: Secondary | ICD-10-CM

## 2024-06-23 DIAGNOSIS — Z9049 Acquired absence of other specified parts of digestive tract: Principal | ICD-10-CM

## 2024-06-23 LAB — TYPE AND SCREEN
ABO/RH(D): A POS
Antibody Screen: NEGATIVE

## 2024-06-23 MED ORDER — LACTATED RINGERS IV SOLN: INTRAVENOUS | Status: DC | PRN

## 2024-06-23 MED ORDER — XLEAR SINUS CARE SPRAY NA SOLN: Freq: Two times a day (BID) | NASAL | Status: DC | PRN

## 2024-06-23 MED ORDER — FENTANYL CITRATE (PF) 250 MCG/5ML IJ SOLN
INTRAMUSCULAR | Status: AC
  Filled 2024-06-23: qty 5

## 2024-06-23 MED ORDER — ONDANSETRON HCL 4 MG/2ML IJ SOLN
4.0000 mg | Freq: Four times a day (QID) | INTRAMUSCULAR | Status: DC | PRN
  Administered 2024-06-23: 4 mg via INTRAVENOUS
  Filled 2024-06-23: qty 2

## 2024-06-23 MED ORDER — ENSURE PRE-SURGERY PO LIQD: 592.0000 mL | Freq: Once | ORAL | Status: DC

## 2024-06-23 MED ORDER — SPY AGENT GREEN - (INDOCYANINE FOR INJECTION)
INTRAMUSCULAR | Status: DC | PRN
  Administered 2024-06-23: 1 mL via INTRAVENOUS

## 2024-06-23 MED ORDER — IBUPROFEN 200 MG PO TABS
600.0000 mg | ORAL_TABLET | Freq: Four times a day (QID) | ORAL | Status: DC | PRN
  Filled 2024-06-23: qty 3

## 2024-06-23 MED ORDER — SUGAMMADEX SODIUM 200 MG/2ML IV SOLN
INTRAVENOUS | Status: AC
  Filled 2024-06-23: qty 4

## 2024-06-23 MED ORDER — ALBUTEROL SULFATE (2.5 MG/3ML) 0.083% IN NEBU: 2.5000 mg | INHALATION_SOLUTION | Freq: Four times a day (QID) | RESPIRATORY_TRACT | Status: DC | PRN

## 2024-06-23 MED ORDER — FLUTICASONE PROPIONATE 50 MCG/ACT NA SUSP: 2.0000 | Freq: Two times a day (BID) | NASAL | Status: DC | PRN

## 2024-06-23 MED ORDER — 0.9 % SODIUM CHLORIDE (POUR BTL) OPTIME
TOPICAL | Status: DC | PRN
  Administered 2024-06-23: 2000 mL

## 2024-06-23 MED ORDER — BUPIVACAINE-EPINEPHRINE (PF) 0.25% -1:200000 IJ SOLN
INTRAMUSCULAR | Status: AC
  Filled 2024-06-23: qty 60

## 2024-06-23 MED ORDER — SUGAMMADEX SODIUM 200 MG/2ML IV SOLN
INTRAVENOUS | Status: DC | PRN
  Administered 2024-06-23: 350 mg via INTRAVENOUS

## 2024-06-23 MED ORDER — POLYETHYLENE GLYCOL 3350 17 GM/SCOOP PO POWD: 238.0000 g | Freq: Once | ORAL | Status: DC

## 2024-06-23 MED ORDER — LIDOCAINE 2% (20 MG/ML) 5 ML SYRINGE
INTRAMUSCULAR | Status: DC | PRN
  Administered 2024-06-23: 60 mg via INTRAVENOUS

## 2024-06-23 MED ORDER — METRONIDAZOLE 500 MG PO TABS: 1000.0000 mg | ORAL_TABLET | ORAL | Status: DC

## 2024-06-23 MED ORDER — ONDANSETRON HCL 4 MG PO TABS: 4.0000 mg | ORAL_TABLET | Freq: Four times a day (QID) | ORAL | Status: DC | PRN

## 2024-06-23 MED ORDER — ONDANSETRON HCL 4 MG/2ML IJ SOLN
INTRAMUSCULAR | Status: DC | PRN
  Administered 2024-06-23: 4 mg via INTRAVENOUS

## 2024-06-23 MED ORDER — OXYCODONE HCL 5 MG/5ML PO SOLN: 5.0000 mg | Freq: Once | ORAL | Status: DC | PRN

## 2024-06-23 MED ORDER — ACETAMINOPHEN 10 MG/ML IV SOLN: 1000.0000 mg | Freq: Once | INTRAVENOUS | Status: DC | PRN

## 2024-06-23 MED ORDER — BISACODYL 5 MG PO TBEC: 20.0000 mg | DELAYED_RELEASE_TABLET | Freq: Once | ORAL | Status: DC

## 2024-06-23 MED ORDER — FENTANYL CITRATE (PF) 50 MCG/ML IJ SOSY: 25.0000 ug | PREFILLED_SYRINGE | INTRAMUSCULAR | Status: DC | PRN

## 2024-06-23 MED ORDER — ALVIMOPAN 12 MG PO CAPS
12.0000 mg | ORAL_CAPSULE | Freq: Two times a day (BID) | ORAL | Status: DC
  Administered 2024-06-24 (×2): 12 mg via ORAL
  Filled 2024-06-23 (×2): qty 1

## 2024-06-23 MED ORDER — PHENYLEPHRINE 80 MCG/ML (10ML) SYRINGE FOR IV PUSH (FOR BLOOD PRESSURE SUPPORT)
PREFILLED_SYRINGE | INTRAVENOUS | Status: DC | PRN
  Administered 2024-06-23: 80 ug via INTRAVENOUS
  Administered 2024-06-23: 120 ug via INTRAVENOUS
  Administered 2024-06-23: 160 ug via INTRAVENOUS
  Administered 2024-06-23: 120 ug via INTRAVENOUS

## 2024-06-23 MED ORDER — SODIUM CHLORIDE 0.9 % IV SOLN
2.0000 g | INTRAVENOUS | Status: AC
  Administered 2024-06-23: 2 g via INTRAVENOUS
  Filled 2024-06-23: qty 2

## 2024-06-23 MED ORDER — HEPARIN SODIUM (PORCINE) 5000 UNIT/ML IJ SOLN
5000.0000 [IU] | Freq: Three times a day (TID) | INTRAMUSCULAR | Status: DC
  Administered 2024-06-23 – 2024-06-25 (×5): 5000 [IU] via SUBCUTANEOUS
  Filled 2024-06-23 (×5): qty 1

## 2024-06-23 MED ORDER — PROPOFOL 10 MG/ML IV BOLUS
INTRAVENOUS | Status: DC | PRN
  Administered 2024-06-23: 100 mg via INTRAVENOUS

## 2024-06-23 MED ORDER — LACTATED RINGERS IR SOLN
Status: DC | PRN
  Administered 2024-06-23: 1000 mL

## 2024-06-23 MED ORDER — HEPARIN SODIUM (PORCINE) 5000 UNIT/ML IJ SOLN
5000.0000 [IU] | Freq: Once | INTRAMUSCULAR | Status: AC
  Administered 2024-06-23: 5000 [IU] via SUBCUTANEOUS
  Filled 2024-06-23: qty 1

## 2024-06-23 MED ORDER — DEXAMETHASONE SOD PHOSPHATE PF 10 MG/ML IJ SOLN
INTRAMUSCULAR | Status: DC | PRN
  Administered 2024-06-23: 10 mg via INTRAVENOUS

## 2024-06-23 MED ORDER — LACTATED RINGERS IV SOLN: INTRAVENOUS | Status: DC

## 2024-06-23 MED ORDER — ALUM & MAG HYDROXIDE-SIMETH 200-200-20 MG/5ML PO SUSP: 30.0000 mL | Freq: Four times a day (QID) | ORAL | Status: DC | PRN

## 2024-06-23 MED ORDER — OXYCODONE HCL 5 MG PO TABS: 5.0000 mg | ORAL_TABLET | Freq: Once | ORAL | Status: DC | PRN

## 2024-06-23 MED ORDER — ALVIMOPAN 12 MG PO CAPS
12.0000 mg | ORAL_CAPSULE | ORAL | Status: AC
  Administered 2024-06-23: 12 mg via ORAL
  Filled 2024-06-23: qty 1

## 2024-06-23 MED ORDER — HYDRALAZINE HCL 20 MG/ML IJ SOLN: 10.0000 mg | INTRAMUSCULAR | Status: DC | PRN

## 2024-06-23 MED ORDER — NEOMYCIN SULFATE 500 MG PO TABS: 1000.0000 mg | ORAL_TABLET | ORAL | Status: DC

## 2024-06-23 MED ORDER — SIMETHICONE 80 MG PO CHEW
40.0000 mg | CHEWABLE_TABLET | Freq: Four times a day (QID) | ORAL | Status: DC | PRN
  Administered 2024-06-23 – 2024-06-24 (×2): 40 mg via ORAL
  Filled 2024-06-23 (×2): qty 1

## 2024-06-23 MED ORDER — ACETAMINOPHEN 500 MG PO TABS
1000.0000 mg | ORAL_TABLET | Freq: Four times a day (QID) | ORAL | Status: DC
  Administered 2024-06-23 – 2024-06-25 (×6): 1000 mg via ORAL
  Filled 2024-06-23 (×6): qty 2

## 2024-06-23 MED ORDER — ENSURE SURGERY PO LIQD: 237.0000 mL | Freq: Two times a day (BID) | ORAL | Status: DC

## 2024-06-23 MED ORDER — ROCURONIUM BROMIDE 10 MG/ML (PF) SYRINGE
PREFILLED_SYRINGE | INTRAVENOUS | Status: DC | PRN
  Administered 2024-06-23 (×2): 10 mg via INTRAVENOUS
  Administered 2024-06-23: 20 mg via INTRAVENOUS
  Administered 2024-06-23: 50 mg via INTRAVENOUS

## 2024-06-23 MED ORDER — FENTANYL CITRATE (PF) 100 MCG/2ML IJ SOLN
INTRAMUSCULAR | Status: DC | PRN
  Administered 2024-06-23: 100 ug via INTRAVENOUS
  Administered 2024-06-23: 50 ug via INTRAVENOUS
  Administered 2024-06-23 (×2): 25 ug via INTRAVENOUS
  Administered 2024-06-23: 50 ug via INTRAVENOUS

## 2024-06-23 MED ORDER — HYDROMORPHONE HCL 1 MG/ML IJ SOLN: 0.5000 mg | INTRAMUSCULAR | Status: DC | PRN

## 2024-06-23 MED ORDER — DIPHENHYDRAMINE HCL 50 MG/ML IJ SOLN: 12.5000 mg | Freq: Four times a day (QID) | INTRAMUSCULAR | Status: DC | PRN

## 2024-06-23 MED ORDER — HYDRALAZINE HCL 20 MG/ML IJ SOLN
10.0000 mg | Freq: Once | INTRAMUSCULAR | Status: AC
  Administered 2024-06-23: 10 mg via INTRAVENOUS

## 2024-06-23 MED ORDER — PANTOPRAZOLE SODIUM 40 MG PO TBEC
40.0000 mg | DELAYED_RELEASE_TABLET | Freq: Every day | ORAL | Status: DC
  Administered 2024-06-24: 40 mg via ORAL
  Filled 2024-06-23: qty 1

## 2024-06-23 MED ORDER — HYDRALAZINE HCL 20 MG/ML IJ SOLN
INTRAMUSCULAR | Status: AC
  Filled 2024-06-23: qty 1

## 2024-06-23 MED ORDER — ENSURE PRE-SURGERY PO LIQD: 296.0000 mL | Freq: Once | ORAL | Status: DC

## 2024-06-23 MED ORDER — CHLORHEXIDINE GLUCONATE 0.12 % MT SOLN
15.0000 mL | Freq: Once | OROMUCOSAL | Status: AC
  Administered 2024-06-23: 15 mL via OROMUCOSAL

## 2024-06-23 MED ORDER — DIPHENHYDRAMINE HCL 12.5 MG/5ML PO ELIX: 12.5000 mg | ORAL_SOLUTION | Freq: Four times a day (QID) | ORAL | Status: DC | PRN

## 2024-06-23 MED ORDER — FERROUS SULFATE 325 (65 FE) MG PO TABS
325.0000 mg | ORAL_TABLET | ORAL | Status: DC
  Filled 2024-06-23: qty 1

## 2024-06-23 MED ORDER — ORAL CARE MOUTH RINSE: 15.0000 mL | Freq: Once | OROMUCOSAL | Status: AC

## 2024-06-23 MED ORDER — BUPIVACAINE-EPINEPHRINE (PF) 0.25% -1:200000 IJ SOLN
INTRAMUSCULAR | Status: DC | PRN
  Administered 2024-06-23: 60 mL

## 2024-06-23 MED ORDER — CHLORHEXIDINE GLUCONATE CLOTH 2 % EX PADS: 6.0000 | MEDICATED_PAD | Freq: Once | CUTANEOUS | Status: DC

## 2024-06-23 MED ORDER — DROPERIDOL 2.5 MG/ML IJ SOLN: 0.6250 mg | Freq: Once | INTRAMUSCULAR | Status: DC | PRN

## 2024-06-23 MED ORDER — PHENYLEPHRINE 80 MCG/ML (10ML) SYRINGE FOR IV PUSH (FOR BLOOD PRESSURE SUPPORT)
PREFILLED_SYRINGE | INTRAVENOUS | Status: AC
  Filled 2024-06-23: qty 10

## 2024-06-23 MED ORDER — TRAMADOL HCL 50 MG PO TABS: 50.0000 mg | ORAL_TABLET | Freq: Four times a day (QID) | ORAL | Status: DC | PRN

## 2024-06-23 NOTE — Transfer of Care (Signed)
 Immediate Anesthesia Transfer of Care Note  Patient: Laura Carter  Procedure(s) Performed: Procedures with comments: RESECTION, RECTUM, LOW ANTERIOR, ROBOT-ASSISTED (N/A) SIGMOIDOSCOPY, DIAGNOSTIC FLEXIBLE (N/A) - FLEXIBLE SIGMOIDOSCOPY, INTRAOPERATIVE PERFUSION ASSESSMENT ICG  Patient Location: PACU  Anesthesia Type:General  Level of Consciousness:  sedated, patient cooperative and responds to stimulation  Airway & Oxygen  Therapy:Patient Spontanous Breathing and Patient connected to face mask oxgen  Post-op Assessment:  Report given to PACU RN and Post -op Vital signs reviewed and stable  Post vital signs:  Reviewed and stable  Last Vitals:  Vitals:   06/23/24 1143  BP: (!) 158/81  Pulse: 77  Resp: 18  Temp: 36.6 C  SpO2: 97%    Complications: No apparent anesthesia complications

## 2024-06-23 NOTE — Op Note (Signed)
 PATIENT: Laura Carter  84 y.o. female  Patient Care Team: Rolinda Millman, MD as PCP - General (Family Medicine)  PREOP DIAGNOSIS: COLON CANCER SIGMOID  POSTOP DIAGNOSIS: COLON CANCER SIGMOID  PROCEDURE:  Robotic assisted low anterior resection Diagnostic flexible sigmoidoscopy (necessary to localize tumor) Intraoperative assessment of perfusion using ICG fluorescence imaging Bilateral transversus abdominus plane (TAP) blocks  SURGEON: Lonni HERO. Teresa, MD  ASSISTANT: Elspeth Schultze, MD  An experienced assistant was required given the complexity of this procedure and the standard of surgical care. My assistant helped with exposure through counter tension, suctioning, ligation and retraction to better visualize the surgical field. My assistant expedited sewing during the case by following my sutures. Wherever I use the term we in the report, my assistant actively helped me with that portion of the procedure.   ANESTHESIA: General endotracheal  EBL: 50 mL Total I/O In: 700 [I.V.:700] Out: 50 [Blood:50]  DRAINS: none  SPECIMEN:  Rectosigmoid colon open and proximal Distal anastomotic donut  COUNTS: Sponge, needle and instrument counts were reported correct x2  FINDINGS: Significant tattooing of much of the sigmoid including the distal sigmoid and at the level of the peritoneal reflection.  It was not immediately apparent where the mass was located within this portion of the colon.  Diagnostic flexible sigmoidoscopy 8 and then was localizing the tumor which is located in the distal sigmoid.  Therefore a low anterior section was necessary for margin purposes.  No evident metastatic disease on visceral parietal peritoneum or liver. A well perfused, tension free, hemostatic, air tight 29 mm EEA colorectal anastomosis fashioned 12 cm from the anal verge by flexible sigmoidoscopy.   NARRATIVE: The patient was seen in the pre-op holding area. The risks, benefits, complications,  treatment options, and expected outcomes were previously discussed with the patient. The patient agreed with the proposed plan and has signed the informed consent form. The patient was brought to the operating room by the surgical team, identified as Laura Carter, and the procedure verified. placed supine on the operating table and SCD's were applied. General endotracheal anesthesia was induced without difficulty. She was then positioned in the lithotomy position with Allen stirrups.  Pressure points were evaluated and padded.  A foley catheter was then placed by nursing under sterile conditions. Hair on the abdomen was clipped.  She was secured to the operating table. The abdomen was then prepped and draped in the standard sterile fashion. A time out was completed and the above information confirmed and need for preoperative antibiotics.    An OG tube was placed by anesthesia and confirmed to be to suction.  At Palmer's point, a stab incision was created and the Veress needle was introduced into the peritoneal cavity on the first attempt.  Intraperitoneal location was confirmed by the aspiration and saline drop test.  Pneumoperitoneum was established to a maximum pressure of 15 mmHg using CO2.  Following this, the abdomen was marked for planned trocar sites.  Just to the right and cephalad to the umbilicus, an 8 mm incision was created and an 8 mm blunt tipped robotic trocar was cautiously placed into the peritoneal cavity.  The laparoscope was inserted and demonstrated no evidence of trocar site nor Veress needle site complications.  The Veress needle was removed.  Bilateral transversus abdominis plane blocks were then created using a dilute mixture of Exparel  with Marcaine .  3 additional 8 mm robotic trochars were placed under direct visualization roughly in a line extending from the right  ASIS towards the left upper quadrant. The bladder was inspected and noted to be at/below the pubic symphysis.  Staying  3 fingerbreadths above the pubic symphysis, an incision was created and the 12 mm robotic trocar inserted directed cephalad into the peritoneal cavity under direct visualization.  An additional 5 mm assist port was placed in the right lateral abdomen under direct visualization.  The abdomen was surveyed and there was evident tattoo throughout the left lower quadrant and much of the mid and distal sigmoid colon.  Peritoneal surfaces are normal. She was positioned in Trendelenburg with the left side tilted slightly up.  Small bowel was carefully retracted out of the pelvis.  The robot was then docked and I went to the console.  The sigmoid colon was readily identified.  Tattoo was noted throughout the mid and distal sigmoid colon as well as down the level of the peritoneal reflection.  There is no clear masslike thickening of the sigmoid colon per se but there are diverticular type changes.  Attachments of the sigmoid colon were taken down from the intersigmoid fossa.  The rectosigmoid colon was grasped and elevated anteriorly.  Beginning with a medial to lateral approach, the peritoneum overlying the presacral space was carefully incised.  The TME plane was readily gained working in a plane between the fascia propria of the rectum and the presacral fascia.  Hypogastric nerves were seen going along the the presacral fascia and were protected free of injury.  Working more proximally, the mesorectum and sigmoid mesentery were carefully mobilized off of the peritoneum.  The left ureter was identified and protected free of injury.  The left gonadal vessels were identified and protected.  These were both swept down.  The superior hemorrhoidal and IMA pedicles were identified. Further mesocolon was mobilized proximally staying in this plane between the retroperitoneum proper and the mesocolon. Attention was then turned to the lateral portion of dissection.  The sigmoid colon was then retracted to the right.  The sigmoid  colon was fully mobilized. The descending colon was mobilized by incising the [place] Daywalt line of Toldt.  This was done all the way up to the level of the splenic flexure.  The associated mesocolon was also mobilized medially.  The left ureter again was confirmed to be well away from the vasculature which had been dissected medially.  The rectosigmoid colon was elevated anteriorly. The left ureter was re-identified. The IMA was clear of this. The IMA was then divided with the vessel sealer. The stump was inspected and noted to be completely hemostatic with a good seal.  The mesentery was divided out to the point of planned proximal division. The proximal sigmoid is then gently occluded with a grasper.  I then scrubbed to pass the flexible sigmoidoscope. The sigmoidoscope was inserted into the anal canal and advanced her directly position.  The distal most aspect of the rectum is normal in appearance.  The distal sigmoid colon was evaluated there is tattooing active evident.  At the level of the distal sigmoid there is evident masslike thickening consistent with endoscopic findings where the cancer was located.  We were able to localize this laparoscopically and this is found to be in the distal sigmoid colon.  Insufflation is evacuated from the rectum.  The sigmoidoscope was withdrawn.  No other notable pathology is noted.  I went back to the console.  A TME type dissection was then commenced involving the proximal rectum going at least 5 cm below the level of the  tumor for margin purposes.  This corresponds to the proximal/mid rectum junction.  A TME type dissection is maintained posteriorly first to stay between the fascia propria the rectum and the presacral fascia.  The dissection commenced laterally on both the left and right sides and then terminated anteriorly.  At the level that we had marked 5 cm below the tumor, the mesorectum is circumferentially cleared using the vessel sealer in a tumor specific TME  technique.  I then went back below to recheck where we had dissected using the flexible sigmoidoscope.  We were able to confirm the margin which is well distal to the tumor.  The distal point of transection on the proximal rectum was identified.   A 60 mm green load robotic stapler was then placed through the 12 mm port and introduced into the peritoneal cavity.  The rectum was divided with a single firing of the stapler.  The stump was intact and healthy in appearance.   Attention was turned to performing a perfusion test. ICG was administered by anesthesia and at the level of the cleared mesentery proximally, there was excellent uptake of the tracer.  The rectum was also well perfused in appearance.  There was a visible pulse in the mesentery out to the level of the cleared colon at the level of the proximal sigmoid/descending colon junction.  This colon is also supple and healthy in appearance without any thickening.  This reached into the pelvis without any difficulty and remained in that location without any tension. A locking grasper was then placed on the proximal rectal staple line.   Attention was turned to the extracorporeal portion of the procedure.  The robot was undocked.  I scrubbed back in.  Using the 12 mm trocar site, a Pfannenstiel incision was created and incorporated the fascial opening through the 12 mm port site.  The rectus fascia was incised and then elevated.  The rectus muscle was mobilized free of the overlying fascia.  The peritoneum was incised in the midline well above the location of the bladder.  An Alexis wound protector was placed.  Towels were placed around the field.  The divided colon was passed through the wound protector.  The point of proximal division was identified and was again on a healthy segment of supple colon with a palpable pulse in the mesentery. This was pink in color.  A pursestring device was applied.  A 2-0 Prolene on a Keith needle was passed.  The colon  was divided and passed off with the open end being proximal.  EEA sizers were then introduced and a 29 mm EEA selected.  Belt loops consisting of 3-0 silk were placed around the pursestring suture line.  The anvil was placed and the pursestring tied.  A small amount of fat was cleared from the planned anastomosis and no diverticula were apparent within this.  This was placed back into the abdomen and a cap placed over the wound protector port site.  Pneumoperitoneum was reestablished.  I then went below to pass the stapler.  My partner remained above.  EEA sizers were cautiously introduced via the anus and advanced under direct visualization.  The stapler was passed and the spike deployed just anterior to the staple line.  The components were then mated.  Orientation was confirmed such that there is no twisting of the colon nor small bowel underneath the mesenteric defect. Care was taken to ensure no other structures were incorporated within this either.  The stapler was  then closed, held, and fired. This was then removed. The donuts were inspected and noted to be complete.  The colon proximal to the anastomosis was then gently occluded. The pelvis was filled with sterile irrigation. Under direct visualization, I passed a flexible sigmoidoscope.  The anastomosis was under water .  With good distention of the anastomosis there was no air leak. The anastomosis pink in appearance.  This is located at 12 cm from the anal verge by flexible sigmoidoscopy.  It is hemostatic.  Additionally, looking from above, there is no tension on the colon or mesentery.  Sigmoidoscope was withdrawn.  Irrigation was evacuated from the pelvis.  The abdomen and pelvis are surveyed and noted to be completely hemostatic without any apparent injury.  Under direct visualization, all trochars are removed.  The Alexis wound protector was removed.  Gowns/gloves are changed and a fresh set of clean instruments utilized. Additional sterile drapes  were placed around the field.   The Pfannenstiel peritoneum was closed with a running 2-0 Vicryl suture.  The rectus fascia was then closed using 2 running #1 PDS sutures.  The fascia was then palpated and noted to be completely closed.  Additional anesthetic was infiltrated at the Pfannenstiel site.  Sponge, needle, and instrument counts were reported correct x2. 4-0 Monocryl subcuticular suture was used to close the skin of all incision sites.  Dermabond was placed over all incisions.    She was then taken out of lithotomy, awakened from anesthesia, extubated, and transferred to a stretcher for transport to PACU in satisfactory condition having tolerated the procedure well.

## 2024-06-23 NOTE — Anesthesia Postprocedure Evaluation (Signed)
"   Anesthesia Post Note  Patient: Cassandr Cederberg Fulco  Procedure(s) Performed: RESECTION, RECTUM, LOW ANTERIOR, ROBOT-ASSISTED (Abdomen) SIGMOIDOSCOPY, DIAGNOSTIC FLEXIBLE     Patient location during evaluation: PACU Anesthesia Type: General Level of consciousness: awake and alert Pain management: pain level controlled Vital Signs Assessment: post-procedure vital signs reviewed and stable Respiratory status: spontaneous breathing, nonlabored ventilation, respiratory function stable and patient connected to nasal cannula oxygen  Cardiovascular status: blood pressure returned to baseline and stable Postop Assessment: no apparent nausea or vomiting Anesthetic complications: no   No notable events documented.  Last Vitals:  Vitals:   06/23/24 1715 06/23/24 1733  BP: 133/73 (!) 153/69  Pulse: (!) 59 (!) 56  Resp: 18 16  Temp: 36.4 C (!) 36.3 C  SpO2: 93% 95%    Last Pain:  Vitals:   06/23/24 1733  TempSrc: Oral  PainSc:                  Thom JONELLE Peoples      "

## 2024-06-23 NOTE — H&P (Signed)
 "  CC: Here today for surgery  HPI: Laura Carter is an 84 y.o. female with history of HTN, asthma, GERD, whom is seen in the office today as a referral by Dr. 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   Colonoscopy 04/19/24 with Dr. Stacia: -Melanosis - 4 mm polyp in cecum removed - 6 mm polyp in ascending colon removed - 15 mm polyp in the ascending colon removed - Three 3-4 mm polyps in transverse colon removed - 2 cm polyp in sigmoid. Biopsied. Tattoo. This lesion was very spacious for adenocarcinoma - 18 mm polyp in distal rectum - Severe diverticulosis in the sigmoid, descending, and mild diverticulosis in transverse. - Examined portion of ileum normal - Distal rectum and anal verge normal on retroflexion  PATH A. COLON POLYPS, CECAL/ASCENDING/TRANSVERSE; POLYPECTOMY:  - Tubular adenoma (multiple fragments).  - Negative for high-grade dysplasia and malignancy.  - Melanosis coli.   B. COLON, ASCENDING, BIOPSY:  - Colonic mucosa with melanosis coli.  - Negative for dysplasia and malignancy.   C. COLON POLYP, SIGMOID; POLYPECTOMY:  - Invasive colorectal adenocarcinoma, moderately differentiated.   Dr. Stacia indicates plans to remove the rectal polyp.  CTA A/P 04/14/24 1. No acute intra-abdominal or pelvic pathology. No CT evidence of active GI bleed. 2. Severe sigmoid diverticulosis. No bowel obstruction. Normal appendix. 3. Large hiatal hernia containing a portion of the stomach. 4. Aortic Atherosclerosis (ICD10-I70.0).  CT Chest 04/28/24 1. No evidence of metastatic disease. 2. 3 mm left lower lobe pulmonary nodule, minimally increased from 2 mm, indeterminate; recommend follow-up per Fleischner Society Guidelines based on risk, or short-interval chest CT in 12 months if clinically warranted given cancer staging context. 3. Large hiatal hernia. 4. Coronary artery disease. 5. Aortic aclerosis.  CEA 04/18/24: 2.2  She denies any changes in  health or health history since we met in the office. No new medications/allergies. She states she is ready for surgery today. Tolerated bowel prep with abx as prescribed. No complaints at present. No n/v or abd pain.  PMH: HTN, asthma, GERD  PSH: Remote cholecystectomy otherwise denies any prior abdominal or pelvic surgical history  FHx: Mother and sister had breast cancer. She reports she declined genetic testing. Denies any other known family history of colorectal, breast, endometrial or ovarian cancer  Social Hx: Denies use of tobacco/EtOH/illicit drug. She is here today with her husband. 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 needle biopsy only   CHOLECYSTECTOMY  02/2009   COLONOSCOPY     COLONOSCOPY N/A 04/17/2024   Procedure: COLONOSCOPY;  Surgeon: Stacia Glendia BRAVO, MD;  Location: [facility];  Service: Gastroenterology;  Laterality: N/A;   ESOPHAGOGASTRODUODENOSCOPY     EYE SURGERY     bilateral   KNEE ARTHROSCOPY WITH MEDIAL MENISECTOMY Left 06/16/2015   Procedure: KNEE ARTHROSCOPY WITH PARTIAL MEDIAL MENISECTOMY;  Surgeon: Taft BRAVO Minerva, MD;  Location: [facility];  Service: Orthopedics;  Laterality: Left;   PARTIAL KNEE ARTHROPLASTY Left 07/08/2017   Procedure:  UNICOMPARTMENTAL KNEE;  Surgeon: Beverley Evalene BIRCH, MD;  Location: [facility] OR;  Service: Orthopedics;  Laterality: Left;   TOTAL KNEE ARTHROPLASTY Right 03/30/2024   Procedure: ARTHROPLASTY, KNEE, TOTAL;  Surgeon: Beverley Evalene BIRCH, MD;  Location: [facility];  Service: Orthopedics;  Laterality: Right;    Family History  Problem Relation Age of Onset    Cancer Mother        breast   Stomach cancer Mother    Breast cancer Mother    Heart disease Father    Heart disease Sister    Breast cancer Sister    Colon cancer Neg Hx    Rectal cancer Neg Hx    Esophageal cancer Neg Hx     Social:  reports that she quit smoking about 59 years ago. Her smoking use included cigarettes. She started smoking about 69 years ago. She has a 10 pack-year smoking history. She has never used smokeless tobacco. She reports that she does not drink alcohol and does not use drugs.  Allergies: Allergies[1]  Medications: I have reviewed the patient's current medications.  No results found for this or any previous visit (from the past 48 hours).  No results found.   PE There were no vitals taken for this visit. Constitutional: NAD; conversant Eyes: Moist conjunctiva; no lid lag; anicteric Lungs: Normal respiratory effort CV: RRR GI: Abd soft, NT/ND; no palpable hepatosplenomegaly Psychiatric: Appropriate affect  No results found for this or any previous visit (from the past 48 hours).  No results found.  A/P: Laura Carter is an 84 y.o. female with hx of HTN, asthma, GERD here for evaluation of newly diagnosed sigmoid colon cancer  Cscope 04/19/24 -2 cm mass in sigmoid biopsied, tattooed proximally distally. Pathology-invasive adenocarcinoma  -Rectal polyp removed 06/11/24 - tubular adenoma  CTA A/P 04/14/24 -no evident metastatic disease CT Chest 04/28/24 - no evidence of metastatic disease  Good activity status, can ascend 2 flights of stairs without stopping or developing shortness of breath/chest pain  -The anatomy and physiology of the GI tract was reviewed with her. The pathophysiology of colon cancer was discussed as well with associated pictures. -We have discussed various different treatment options going forward including surgery (the most definitive) to address this -robotic assisted sigmoidectomy/low anterior resection; flexible  sigmoidoscopy  -The planned procedure, material risks (including, but not limited to, pain, bleeding, infection, scarring, need for blood transfusion, damage to surrounding structures- blood vessels/nerves/viscus/organs, damage to ureter, urine leak, leak from anastomosis, need for additional procedures, low anterior resection syndrome (LARS) = increased fecal urgency and/or frequency, scenarios where a stoma may be necessary and where it may be permanent, worsening of pre-existing medical conditions, chronic diarrhea, constipation secondary to narcotic use, hernia, recurrence, blood clot, pulmonary embolus, pneumonia, heart attack, stroke, death) benefits and alternatives to surgery were discussed at length. The patient's questions were answered to her satisfaction, she voiced understanding and elected to proceed with surgery. Additionally, we discussed typical postoperative    Lonni Pizza, MD [facility]     [1]  Allergies Allergen Reactions   Meloxicam  Other (See Comments)    Possible diverticulitis    "

## 2024-06-23 NOTE — Anesthesia Procedure Notes (Signed)
 Procedure Name: Intubation Date/Time: 06/23/2024 1:19 PM  Performed by: Vincenzo Show, CRNAPre-anesthesia Checklist: Patient identified, Emergency Drugs available, Suction available, Patient being monitored and Timeout performed Patient Re-evaluated:Patient Re-evaluated prior to induction Oxygen  Delivery Method: Circle system utilized Preoxygenation: Pre-oxygenation with 100% oxygen  Induction Type: IV induction Ventilation: Mask ventilation without difficulty Laryngoscope Size: Mac and 3 Grade View: Grade III Tube type: Oral Tube size: 7.0 mm Number of attempts: 1 Airway Equipment and Method: Stylet Placement Confirmation: ETT inserted through vocal cords under direct vision, positive ETCO2, CO2 detector and breath sounds checked- equal and bilateral Secured at: 22 cm Tube secured with: Tape Dental Injury: Teeth and Oropharynx as per pre-operative assessment  Comments: ATOI, Glidescope used to visualize passing of ett.  G1 with glide.

## 2024-06-24 ENCOUNTER — Encounter (HOSPITAL_COMMUNITY): Payer: Self-pay | Admitting: Surgery

## 2024-06-24 LAB — CBC
HCT: 37 % (ref 36.0–46.0)
Hemoglobin: 11.3 g/dL — ABNORMAL LOW (ref 12.0–15.0)
MCH: 26 pg (ref 26.0–34.0)
MCHC: 30.5 g/dL (ref 30.0–36.0)
MCV: 85.1 fL (ref 80.0–100.0)
Platelets: 303 10*3/uL (ref 150–400)
RBC: 4.35 MIL/uL (ref 3.87–5.11)
RDW: 15.9 % — ABNORMAL HIGH (ref 11.5–15.5)
WBC: 7.6 10*3/uL (ref 4.0–10.5)
nRBC: 0 % (ref 0.0–0.2)

## 2024-06-24 LAB — BASIC METABOLIC PANEL WITH GFR
Anion gap: 12 (ref 5–15)
BUN: 9 mg/dL (ref 8–23)
CO2: 23 mmol/L (ref 22–32)
Calcium: 9.2 mg/dL (ref 8.9–10.3)
Chloride: 102 mmol/L (ref 98–111)
Creatinine, Ser: 0.71 mg/dL (ref 0.44–1.00)
GFR, Estimated: >60 mL/min
Glucose, Bld: 152 mg/dL — ABNORMAL HIGH (ref 70–99)
Potassium: 4.2 mmol/L (ref 3.5–5.1)
Sodium: 136 mmol/L (ref 135–145)

## 2024-06-24 NOTE — Progress Notes (Signed)
 Mobility Specialist - Progress Note:   06/24/24 1321  Mobility  Activity Ambulated with assistance  Level of Assistance Contact guard assist, steadying assist  Assistive Device Cane  Distance Ambulated (ft) 160 ft  Activity Response Tolerated well  Mobility Referral Yes  Mobility visit 1 Mobility  Mobility Specialist Start Time (ACUTE ONLY) 1247  Mobility Specialist Stop Time (ACUTE ONLY) 1303  Mobility Specialist Time Calculation (min) (ACUTE ONLY) 16 min   Pt was received in recliner and agreeable to mobility. CGA sit to stand. Pt stated LE soreness, continued ambulation. Pt grew fatigued during end of session but recovered quickly. Returned to bed with all needs met. Call bell in reach.   [employer] - Mobility Specialist - Acute Rehabilitation Can be reached via [employer]

## 2024-06-24 NOTE — Progress Notes (Signed)
 " Subjective No acute events. Feeling well. No n/v on liquids. Has not been up yet  Objective: Vital signs in last 24 hours: Temp:  [97.3 F (36.3 C)-98.5 F (36.9 C)] 98.5 F (36.9 C) (01/29 0529) Pulse Rate:  [56-82] 74 (01/29 0529) Resp:  [15-22] 15 (01/29 0529) BP: (133-194)/(67-96) 162/75 (01/29 0529) SpO2:  [92 %-100 %] 95 % (01/29 0529) Weight:  [80.3 kg-87.8 kg] 87.8 kg (01/29 0500) Last BM Date : 06/21/24  Intake/Output from previous day: 01/28 0701 - 01/29 0700 In: 2780 [P.O.:320; I.V.:2460] Out: 1000 [Urine:950; Blood:50] Intake/Output this shift: No intake/output data recorded.  Gen: NAD, comfortable CV: RRR Pulm: Normal work of breathing Abd: Soft, NT/ND. Incisions c/d/I without erythema Ext: SCDs in place  Lab Results: CBC  Recent Labs    06/24/24 0449  WBC 7.6  HGB 11.3*  HCT 37.0  PLT 303   BMET Recent Labs    06/24/24 0449  NA 136  K 4.2  CL 102  CO2 23  GLUCOSE 152*  BUN 9  CREATININE 0.71  CALCIUM  9.2   PT/INR No results for input(s): LABPROT, INR in the last 72 hours. ABG No results for input(s): PHART, HCO3 in the last 72 hours.  Invalid input(s): PCO2, PO2  Studies/Results:  Anti-infectives: Anti-infectives (From admission, onward)    Start     Dose/Rate Route Frequency Ordered Stop   06/23/24 1400  neomycin  (MYCIFRADIN ) tablet 1,000 mg  Status:  Discontinued       Placed in And Linked Group   1,000 mg Oral 3 times per day 06/23/24 1140 06/23/24 1141   06/23/24 1400  metroNIDAZOLE  (FLAGYL ) tablet 1,000 mg  Status:  Discontinued       Placed in And Linked Group   1,000 mg Oral 3 times per day 06/23/24 1140 06/23/24 1141   06/23/24 1145  cefoTEtan  (CEFOTAN ) 2 g in sodium chloride  0.9 % 100 mL IVPB        2 g 200 mL/hr over 30 Minutes Intravenous On call to O.R. 06/23/24 1140 06/23/24 1900        Assessment/Plan: Patient Active Problem List   Diagnosis Date Noted   S/P laparoscopic-assisted  sigmoidectomy 06/23/2024   Mass of colon 04/17/2024   Diverticulosis of colon with hemorrhage 04/17/2024   Benign neoplasm of ascending colon 04/17/2024   Essential hypertension 04/15/2024   Mild intermittent asthma 04/15/2024   Class 1 obesity due to excess calories with body mass index (BMI) of 32.0 to 32.9 in adult 04/15/2024   ABLA (acute blood loss anemia) 04/15/2024   Lower GI bleed 04/14/2024   S/P total knee arthroplasty, right 03/30/2024   Myofascial pain 02/27/2022   Leg length discrepancy 07/12/2020   Spinal stenosis, lumbar region, with neurogenic claudication 04/03/2020   Nerve pain 04/03/2020   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 All questions answered. She expressed understanding and agreement with the plan.   LOS: 1 day   Lonni Pizza,  MD [facility] "

## 2024-06-24 NOTE — TOC Initial Note (Addendum)
 Transition of Care [facility]) - Initial/Assessment Note    Patient Details  Name: Laura Carter MRN: [id number] Date of Birth: 1940-10-31  [facility] CM/SW Contact:    Alfonse JONELLE Rex, RN Phone Number: 06/24/2024, 2:49 PM  Clinical Narrative: Met with patient at bedside to introduce role of INPT CM and review for dc planning PT in room during assessment, patient s/p TKA in November 2025, recommendation to continue OPPT at [facility] PT. 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 Patient agreeable to NCM scheduling 2 week PCP follow up appt. CM will continue to follow.      -3:10pm Call to pt's PCP office, Vernell Fort, MD, 2 week hospital follow up scheduled for 2/11/ 26 at 11:00am, added to AVS.                 Expected Discharge Plan: OP Rehab Barriers to Discharge: Continued Medical Work up   Patient Goals and CMS Choice Patient states their goals for this hospitalization and ongoing recovery are:: return home          Expected Discharge Plan and Services       Living arrangements for the past 2 months: Single Family Home                                      Prior Living Arrangements/Services Living arrangements for the past 2 months: Single Family Home Lives with:: Spouse Patient language and need for interpreter reviewed:: Yes Do you feel safe going back to the place where you live?: Yes      Need for Family Participation in Patient Care: Yes (Comment) Care giver support system in place?: Yes (comment)   Criminal Activity/Legal Involvement Pertinent to Current Situation/Hospitalization: No - Comment as needed  Activities of Daily Living   ADL Screening (condition at time of admission) Independently performs ADLs?: Yes (appropriate for developmental age) Is the patient deaf or have difficulty hearing?: Yes Does the patient have difficulty seeing, even when wearing  glasses/contacts?: No Does the patient have difficulty concentrating, remembering, or making decisions?: No  Permission Sought/Granted                  Emotional Assessment Appearance:: Appears stated age Attitude/Demeanor/Rapport: Engaged Affect (typically observed): Accepting Orientation: : Oriented to Self, Oriented to Place, Oriented to  Time, Oriented to Situation Alcohol / Substance Use: Not Applicable Psych Involvement: No (comment)  Admission diagnosis:  S/P laparoscopic-assisted sigmoidectomy [Z90.49] Patient Active Problem List   Diagnosis Date Noted   S/P laparoscopic-assisted sigmoidectomy 06/23/2024   Mass of colon 04/17/2024   Diverticulosis of colon with hemorrhage 04/17/2024   Benign neoplasm of ascending colon 04/17/2024   Essential hypertension 04/15/2024   Mild intermittent asthma 04/15/2024   Class 1 obesity due to excess calories with body mass index (BMI) of 32.0 to 32.9 in adult 04/15/2024   ABLA (acute blood loss anemia) 04/15/2024   Lower GI bleed 04/14/2024   S/P total knee arthroplasty, right 03/30/2024   Myofascial pain 02/27/2022   Leg length discrepancy 07/12/2020   Spinal stenosis, lumbar region, with neurogenic claudication 04/03/2020   Nerve pain 04/03/2020   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 04/03/2020   Primary osteoarthritis of knee 07/08/2017   GERD (gastroesophageal reflux disease) 06/16/2017   Medial meniscus, posterior horn derangement  Arthritis of knee    Primary osteoarthritis of left knee 04/05/2014   Diastolic dysfunction    PCP:  Rolinda Millman, MD Pharmacy:   [facility] [place], [place] - [address] [place] [address] Phone: [phone] Fax: [phone]  [id number] - [place], [place] - [address] [facility] [address] [place] [address] Phone: [phone] Fax:  [phone]     Social Drivers of Health (SDOH) Social History: SDOH Screenings   Food Insecurity: No Food Insecurity (06/23/2024)  Housing: Low Risk (06/23/2024)  Transportation Needs: No Transportation Needs (06/23/2024)  Utilities: Not At Risk (06/23/2024)  Depression (PHQ2-9): Low Risk (11/05/2021)  Social Connections: Socially Integrated (06/23/2024)  Tobacco Use: Medium Risk (06/23/2024)   SDOH Interventions:     Readmission Risk Interventions    06/24/2024    2:48 PM  Readmission Risk Prevention Plan  Post Dischage Appt Complete  Medication Screening Complete  Transportation Screening Complete

## 2024-06-24 NOTE — Evaluation (Signed)
 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 Pt with history of HTN, asthma, GERD, R TKA 03/2024, OSA on CPAP.  Clinical Impression  Pt admitted with above diagnosis. Pt is mobilizing well, she ambulated 140' with RW, no loss of balance. Prior to admission she was ambulating independently with a straight cane and was going to outpatient PT for her recent R TKA. Reviewed TKA exercises for pt to perform independently. Pt plans to resume outpatient PT upon DC from hospital.  Pt currently with functional limitations due to the deficits listed below (see PT Problem List). Pt will benefit from acute skilled PT to increase their independence and safety with mobility to allow discharge.           If plan is discharge home, recommend the following: Assistance with cooking/housework;Help with stairs or ramp for entrance   Can travel by private vehicle        Equipment Recommendations None recommended by PT  Recommendations for Other Services       Functional Status Assessment Patient has had a recent decline in their functional status and demonstrates the ability to make significant improvements in function in a reasonable and predictable amount of time.     Precautions / Restrictions Precautions Precautions: Other (comment) Recall of Precautions/Restrictions: Intact Precaution/Restrictions Comments: abdominal surgery, recent R TKA Restrictions Weight Bearing Restrictions Per Provider Order: No      Mobility  Bed Mobility Overal bed mobility: Modified Independent             General bed mobility comments: log roll, used rail for sidelying to sit    Transfers Overall transfer level: Needs assistance Equipment used: Rolling walker (2 wheels) Transfers: Sit to/from  Stand Sit to Stand: Supervision                Ambulation/Gait Ambulation/Gait assistance: Modified independent (Device/Increase time) Gait Distance (Feet): 140 Feet Assistive device: Rolling walker (2 wheels) Gait Pattern/deviations: Step-through pattern, Decreased stride length Gait velocity: WFL     General Gait Details: steady, no loss of balance  Stairs            Wheelchair Mobility     Tilt Bed    Modified Rankin (Stroke Patients Only)       Balance Overall balance assessment: Modified Independent                                           Pertinent Vitals/Pain Pain Assessment Pain Assessment: 0-10 Pain Score: 4  Pain Location: lower abdomen Pain Descriptors / Indicators: Tender, Pressure Pain Intervention(s): Limited activity within patient's tolerance, Monitored during session, Premedicated before session, Repositioned    Home Living Family/patient expects to be discharged to:: Private residence Living Arrangements: Spouse/significant other Available Help at Discharge: Family Type of Home: House Home Access: Stairs to enter   Secretary/administrator of Steps: 3 Alternate Level Stairs-Number of Steps: elevator Home Layout: Multi-level Home Equipment: Agricultural Consultant (2 wheels);Cane - single point;Shower seat      Prior Function Prior Level of Function : Independent/Modified Independent;Driving             Mobility Comments: SPC for support since recent R TKA ADLs Comments: mod I for all ADLs, self care tasks  and IADLs     Extremity/Trunk Assessment   Upper Extremity Assessment Upper Extremity Assessment: Overall WFL for tasks assessed    Lower Extremity Assessment Lower Extremity Assessment: Overall WFL for tasks assessed;RLE deficits/detail RLE Deficits / Details: knee AAROM ~3-110*; RLE Sensation: WNL RLE Coordination: WNL    Cervical / Trunk Assessment Cervical / Trunk Assessment: Normal  Communication    Communication Communication: No apparent difficulties    Cognition Arousal: Alert Behavior During Therapy: WFL for tasks assessed/performed   PT - Cognitive impairments: No apparent impairments                         Following commands: Intact       Cueing       General Comments      Exercises Total Joint Exercises Ankle Circles/Pumps: AROM, Both, 10 reps, Supine Heel Slides: AAROM, Right, 5 reps, Supine Long Arc Quad: AROM, Right, 5 reps, Seated Knee Flexion: AAROM, Right, 5 reps, Seated Goniometric ROM: 3-110* AROM R knee Other Exercises Other Exercises: R gastroc and R hamstring stretch in supine with gait belt used a strap   Assessment/Plan    PT Assessment Patient needs continued PT services  PT Problem List Decreased range of motion;Decreased activity tolerance;Pain       PT Treatment Interventions Gait training;Therapeutic exercise;DME instruction    PT Goals (Current goals can be found in the Care Plan section)  Acute Rehab PT Goals Patient Stated Goal: shopping, church PT Goal Formulation: With patient Time For Goal Achievement: 08/05/24 Potential to Achieve Goals: Good    Frequency Min 3X/week     Co-evaluation               AM-PAC PT 6 Clicks Mobility  Outcome Measure Help needed turning from your back to your side while in a flat bed without using bedrails?: None Help needed moving from lying on your back to sitting on the side of a flat bed without using bedrails?: A Little Help needed moving to and from a bed to a chair (including a wheelchair)?: None Help needed standing up from a chair using your arms (e.g., wheelchair or bedside chair)?: None Help needed to walk in hospital room?: None Help needed climbing 3-5 steps with a railing? : None 6 Click Score: 23    End of Session Equipment Utilized During Treatment: Gait belt Activity Tolerance: Patient tolerated treatment well Patient left: in bed;with call bell/phone  within reach;with bed alarm set Nurse Communication: Mobility status;Other (comment) (compression pumps aren't working) PT Visit Diagnosis: Pain Pain - Right/Left: Right Pain - part of body: Knee    Time: 8597-8556 PT Time Calculation (min) (ACUTE ONLY): 41 min   Charges:   PT Evaluation $PT Eval Moderate Complexity: 1 Mod PT Treatments $Gait Training: 8-22 mins $Therapeutic Exercise: 8-22 mins PT General Charges $$ ACUTE PT VISIT: 1 Visit         Sylvan Delon Copp PT 06/24/2024  Acute Rehabilitation Services  Office [phone]

## 2024-06-24 NOTE — Progress Notes (Signed)
 Mobility Specialist - Progress Note:   06/24/24 1020  Mobility  Activity Ambulated with assistance  Level of Assistance Contact guard assist, steadying assist  Assistive Device Front wheel walker  Distance Ambulated (ft) 65 ft  Activity Response Tolerated well  Mobility Referral Yes  Mobility visit 1 Mobility  Mobility Specialist Start Time (ACUTE ONLY) 0935  Mobility Specialist Stop Time (ACUTE ONLY) 0950  Mobility Specialist Time Calculation (min) (ACUTE ONLY) 15 min   Pt was received in bed and agreed to mobility. CGA sit to stand and ambulation. Returned to recliner with all needs met. Call bell in reach.  [employer] - Mobility Specialist - Acute Rehabilitation Can be reached via [employer]

## 2024-06-25 ENCOUNTER — Other Ambulatory Visit (HOSPITAL_COMMUNITY): Payer: Self-pay

## 2024-06-25 LAB — CBC
HCT: 38.2 % (ref 36.0–46.0)
Hemoglobin: 12 g/dL (ref 12.0–15.0)
MCH: 26.3 pg (ref 26.0–34.0)
MCHC: 31.4 g/dL (ref 30.0–36.0)
MCV: 83.6 fL (ref 80.0–100.0)
Platelets: 303 10*3/uL (ref 150–400)
RBC: 4.57 MIL/uL (ref 3.87–5.11)
RDW: 16.3 % — ABNORMAL HIGH (ref 11.5–15.5)
WBC: 8.5 10*3/uL (ref 4.0–10.5)
nRBC: 0 % (ref 0.0–0.2)

## 2024-06-25 LAB — BASIC METABOLIC PANEL WITH GFR
Anion gap: 9 (ref 5–15)
BUN: 11 mg/dL (ref 8–23)
CO2: 27 mmol/L (ref 22–32)
Calcium: 9.5 mg/dL (ref 8.9–10.3)
Chloride: 103 mmol/L (ref 98–111)
Creatinine, Ser: 0.73 mg/dL (ref 0.44–1.00)
GFR, Estimated: >60 mL/min
Glucose, Bld: 112 mg/dL — ABNORMAL HIGH (ref 70–99)
Potassium: 3.6 mmol/L (ref 3.5–5.1)
Sodium: 139 mmol/L (ref 135–145)

## 2024-06-25 MED ORDER — TRAMADOL HCL 50 MG PO TABS
50.0000 mg | ORAL_TABLET | Freq: Four times a day (QID) | ORAL | 0 refills | Status: AC | PRN
  Filled 2024-06-25: qty 15, 4d supply, fill #0

## 2024-06-25 NOTE — Progress Notes (Signed)
 " Subjective No acute events. Feeling great. No n/v. Tolerating diet without issue. Having flatus and BM  Objective: Vital signs in last 24 hours: Temp:  [97.5 F (36.4 C)-98.9 F (37.2 C)] 97.5 F (36.4 C) (01/30 0550) Pulse Rate:  [72-80] 74 (01/30 0550) Resp:  [16-17] 17 (01/30 0550) BP: (140-172)/(73-83) 143/74 (01/30 0550) SpO2:  [92 %-97 %] 92 % (01/30 0550) Weight:  [75.3 kg] 75.3 kg (01/30 0500) Last BM Date : 06/25/24  Intake/Output from previous day: 01/29 0701 - 01/30 0700 In: 540 [P.O.:540] Out: 1500 [Urine:1500] Intake/Output this shift: Total I/O In: 240 [P.O.:240] Out: -   Gen: NAD, comfortable CV: RRR Pulm: Normal work of breathing Abd: Soft, NT/ND. Incisions c/d/I without erythema/drainage. Ext: SCDs in place  Lab Results: CBC  Recent Labs    06/24/24 0449 06/25/24 0605  WBC 7.6 8.5  HGB 11.3* 12.0  HCT 37.0 38.2  PLT 303 303   BMET Recent Labs    06/24/24 0449 06/25/24 0605  NA 136 139  K 4.2 3.6  CL 102 103  CO2 23 27  GLUCOSE 152* 112*  BUN 9 11  CREATININE 0.71 0.73  CALCIUM  9.2 9.5   PT/INR No results for input(s): LABPROT, INR in the last 72 hours. ABG No results for input(s): PHART, HCO3 in the last 72 hours.  Invalid input(s): PCO2, PO2  Studies/Results:  Anti-infectives: Anti-infectives (From admission, onward)    Start     Dose/Rate Route Frequency Ordered Stop   06/23/24 1400  neomycin  (MYCIFRADIN ) tablet 1,000 mg  Status:  Discontinued       Placed in And Linked Group   1,000 mg Oral 3 times per day 06/23/24 1140 06/23/24 1141   06/23/24 1400  metroNIDAZOLE  (FLAGYL ) tablet 1,000 mg  Status:  Discontinued       Placed in And Linked Group   1,000 mg Oral 3 times per day 06/23/24 1140 06/23/24 1141   06/23/24 1145  cefoTEtan  (CEFOTAN ) 2 g in sodium chloride  0.9 % 100 mL IVPB        2 g 200 mL/hr over 30 Minutes Intravenous On call to O.R. 06/23/24 1140 06/23/24 1900         Assessment/Plan: Patient Active Problem List   Diagnosis Date Noted   S/P laparoscopic-assisted sigmoidectomy 06/23/2024   Mass of colon 04/17/2024   Diverticulosis of colon with hemorrhage 04/17/2024   Benign neoplasm of ascending colon 04/17/2024   Essential hypertension 04/15/2024   Mild intermittent asthma 04/15/2024   Class 1 obesity due to excess calories with body mass index (BMI) of 32.0 to 32.9 in adult 04/15/2024   ABLA (acute blood loss anemia) 04/15/2024   Lower GI bleed 04/14/2024   S/P total knee arthroplasty, right 03/30/2024   Myofascial pain 02/27/2022   Leg length discrepancy 07/12/2020   Spinal stenosis, lumbar region, with neurogenic claudication 04/03/2020   Nerve pain 04/03/2020   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 She is comfortable with and stable for discharge home. - PPX: SQH, SCDs  LOS: 2 days   Lonni Pizza,  MD [facility]  "

## 2024-06-25 NOTE — Discharge Summary (Signed)
 Patient ID: Laura Carter MRN: [id number] DOB/AGE: [birth year] 85 y.o.  Admit date: 06/23/2024 Discharge date: 06/25/2024  Discharge Diagnoses Patient Active Problem List   Diagnosis Date Noted   S/P laparoscopic-assisted sigmoidectomy 06/23/2024   Mass of colon 04/17/2024   Diverticulosis of colon with hemorrhage 04/17/2024   Benign neoplasm of ascending colon 04/17/2024   Essential hypertension 04/15/2024   Mild intermittent asthma 04/15/2024   Class 1 obesity due to excess calories with body mass index (BMI) of 32.0 to 32.9 in adult 04/15/2024   ABLA (acute blood loss anemia) 04/15/2024   Lower GI bleed 04/14/2024   S/P total knee arthroplasty, right 03/30/2024   Myofascial pain 02/27/2022   Leg length discrepancy 07/12/2020   Spinal stenosis, lumbar region, with neurogenic claudication 04/03/2020   Nerve pain 04/03/2020   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 She began having spontaneous return of bowel function, her diet advanced and she was mobilizing well on her own. On 06/25/24 she is comfortable with and stable for discharge home. Postoperative expectations were reviewed, all questions answered. She expressed understanding and agreement with the plan. Postop followup arranged.    Allergies as of 06/25/2024       Reactions   Meloxicam  Other (See Comments)   Possible  diverticulitis         Medication List     PAUSE taking these medications    metoprolol  succinate 25 MG 24 hr tablet Wait to take this until your doctor or other care provider tells you to start again. Commonly known as: TOPROL -XL TAKE (1) TABLET BY MOUTH ONCE DAILY.       TAKE these medications    acetaminophen  500 MG tablet Commonly known as: TYLENOL  Take 2 tablets (1,000 mg total) by mouth 2 (two) times daily as needed for mild pain (pain score 1-3) or moderate pain (pain score 4-6).   albuterol  108 (90 Base) MCG/ACT inhaler Commonly known as: VENTOLIN  HFA Inhale 2 puffs into the lungs every 6 (six) hours as needed.   ascorbic acid 1000 MG tablet Commonly known as: VITAMIN C Take 4,000 mg by mouth daily.   b complex vitamins capsule Take 2 capsules by mouth daily as needed (Supplement).   bisacodyl  5 MG EC tablet Commonly known as: DULCOLAX Take 5 mg by mouth as directed.   ECHINACEA EXTRACT PO Take 1 tablet by mouth daily as needed (Supplement).   esomeprazole 20 MG capsule Commonly known as: NEXIUM Take 20 mg by mouth at bedtime.   ferrous sulfate  325 (65 FE) MG tablet Take 325 mg by mouth every other day.   fluticasone  50 MCG/ACT nasal spray Commonly known as: FLONASE  Place 2 sprays into both nostrils 2 (two) times daily as needed for allergies or rhinitis.   metroNIDAZOLE  500 MG tablet Commonly known as: FLAGYL  Take 500 mg by mouth as directed.   neomycin  500 MG tablet Commonly known as: MYCIFRADIN  Take 500 mg by mouth as directed.   NON FORMULARY Pt uses a cpap  nightly   OVER THE COUNTER MEDICATION Take 2 tablets by mouth at bedtime. Prune-Lax   polyethylene glycol 17 g packet Commonly known as: MIRALAX  / GLYCOLAX  Take 238 g by mouth as directed.   QC TUMERIC COMPLEX PO Take 1 tablet by mouth daily as needed (Supplement).   traMADol  50 MG tablet Commonly known as: Ultram  Take 1 tablet (50 mg total) by mouth every 6 (six) hours as  needed for up to 5 days (postop pain not controlled with tylenol  and ibuprofen  first).   VITAMIN D PO Take 1 tablet by mouth daily as needed (Supplement).   XLEAR SINUS CARE SPRAY NA Place 1 spray into the nose 2 (two) times daily as needed (Congestion).   zinc gluconate 50 MG tablet Take 50 mg by mouth daily as needed (Supplement).          Follow-up Information     Laurice President, NP Follow up.   Specialties: Nurse Practitioner, Family Medicine Why: Hospital follow up   Wednesday, July 07, 2024 at 11:00am Contact information: [address] [place] [address] [phone]         Teresa Lonni HERO, MD Follow up on 07/13/2024.   Specialties: General Surgery, Colon and Rectal Surgery Why: please arrive by 8:30 Contact information: [address] [place] [address] [phone]                 Lonni HERO. Teresa, M.D. [facility]

## 2024-06-25 NOTE — TOC Transition Note (Signed)
 Transition of Care [facility]) - Discharge Note   Patient Details  Name: Laura Carter MRN: [id number] Date of Birth: [birth year]  [facility] CM/SW Contact:  Bascom Service, RN Phone Number: 06/25/2024, 9:14 AM   Clinical Narrative:see TOC prior TOC note for further d/c details. D/c  home. Otpt PT;PCP appt set. No further CM needs.      Final next level of care: OP Rehab Barriers to Discharge: No Barriers Identified   Patient Goals and CMS Choice Patient states their goals for this hospitalization and ongoing recovery are:: Home          Discharge Placement                       Discharge Plan and Services Additional resources added to the After Visit Summary for                                       Social Drivers of Health (SDOH) Interventions SDOH Screenings   Food Insecurity: No Food Insecurity (06/23/2024)  Housing: Low Risk (06/23/2024)  Transportation Needs: No Transportation Needs (06/23/2024)  Utilities: Not At Risk (06/23/2024)  Depression (PHQ2-9): Low Risk (11/05/2021)  Social Connections: Socially Integrated (06/23/2024)  Tobacco Use: Medium Risk (06/23/2024)     Readmission Risk Interventions    06/24/2024    2:48 PM  Readmission Risk Prevention Plan  Post Dischage Appt Complete  Medication Screening Complete  Transportation Screening Complete

## 2024-06-25 NOTE — Care Management Important Message (Signed)
 Important Message  Patient Details IM Letter given. Name: Laura Carter MRN: [id number] Date of Birth: 07-20-1940   Important Message Given:  Yes - Medicare IM     [name] 06/25/2024, 9:11 AM

## 2024-06-25 NOTE — Plan of Care (Signed)
" °  Problem: Education: Goal: Understanding of discharge needs will improve Outcome: Progressing   Problem: Activity: Goal: Ability to tolerate increased activity will improve Outcome: Progressing   Problem: Bowel/Gastric: Goal: Gastrointestinal status for postoperative course will improve Outcome: Progressing   Problem: Health Behavior/Discharge Planning: Goal: Identification of community resources to assist with postoperative recovery needs will improve Outcome: Progressing   Problem: Nutritional: Goal: Will attain and maintain optimal nutritional status will improve Outcome: Progressing   Problem: Clinical Measurements: Goal: Postoperative complications will be avoided or minimized Outcome: Progressing   Problem: Respiratory: Goal: Respiratory status will improve Outcome: Progressing   Problem: Skin Integrity: Goal: Will show signs of wound healing Outcome: Progressing   Problem: Pain Managment: Goal: General experience of comfort will improve and/or be controlled Outcome: Progressing   "

## 2024-06-25 NOTE — Progress Notes (Signed)
 Discharge medications delivered to patient at the bedside in a secure bag.

## 2024-06-25 NOTE — Discharge Instructions (Signed)

## 2024-06-25 NOTE — Progress Notes (Signed)
 Patient discharged home, IV removed, discharge paperwork provided and explained, patient verbalized understanding.

## 2024-06-28 ENCOUNTER — Ambulatory Visit: Payer: Self-pay | Admitting: Surgery

## 2024-06-28 LAB — SURGICAL PATHOLOGY
